# Patient Record
Sex: Male | Born: 1940
Health system: Southern US, Community
[De-identification: ages and names within clinical notes are randomized; demographics above are authoritative.]

## PROBLEM LIST (undated history)

## (undated) DIAGNOSIS — M545 Low back pain: Secondary | ICD-10-CM

## (undated) DIAGNOSIS — I6522 Occlusion and stenosis of left carotid artery: Secondary | ICD-10-CM

## (undated) DIAGNOSIS — C4490 Unspecified malignant neoplasm of skin, unspecified: Secondary | ICD-10-CM

## (undated) DIAGNOSIS — I10 Essential (primary) hypertension: Secondary | ICD-10-CM

## (undated) DIAGNOSIS — I739 Peripheral vascular disease, unspecified: Secondary | ICD-10-CM

## (undated) DIAGNOSIS — N509 Disorder of male genital organs, unspecified: Secondary | ICD-10-CM

## (undated) DIAGNOSIS — L0212 Furuncle of neck: Secondary | ICD-10-CM

## (undated) DIAGNOSIS — R109 Unspecified abdominal pain: Secondary | ICD-10-CM

## (undated) DIAGNOSIS — K08109 Complete loss of teeth, unspecified cause, unspecified class: Secondary | ICD-10-CM

## (undated) DIAGNOSIS — K420 Umbilical hernia with obstruction, without gangrene: Secondary | ICD-10-CM

## (undated) DIAGNOSIS — Z87442 Personal history of urinary calculi: Secondary | ICD-10-CM

## (undated) DIAGNOSIS — M48061 Spinal stenosis, lumbar region without neurogenic claudication: Secondary | ICD-10-CM

## (undated) DIAGNOSIS — M1990 Unspecified osteoarthritis, unspecified site: Secondary | ICD-10-CM

## (undated) DIAGNOSIS — Z95820 Peripheral vascular angioplasty status with implants and grafts: Secondary | ICD-10-CM

## (undated) DIAGNOSIS — E538 Deficiency of other specified B group vitamins: Secondary | ICD-10-CM

## (undated) DIAGNOSIS — K219 Gastro-esophageal reflux disease without esophagitis: Secondary | ICD-10-CM

## (undated) DIAGNOSIS — M549 Dorsalgia, unspecified: Secondary | ICD-10-CM

## (undated) DIAGNOSIS — K2270 Barrett's esophagus without dysplasia: Secondary | ICD-10-CM

## (undated) DIAGNOSIS — G4700 Insomnia, unspecified: Secondary | ICD-10-CM

## (undated) DIAGNOSIS — N61 Mastitis without abscess: Secondary | ICD-10-CM

## (undated) DIAGNOSIS — L0213 Carbuncle of neck: Secondary | ICD-10-CM

## (undated) DIAGNOSIS — M542 Cervicalgia: Secondary | ICD-10-CM

## (undated) DIAGNOSIS — K602 Anal fissure, unspecified: Secondary | ICD-10-CM

## (undated) DIAGNOSIS — R51 Headache: Secondary | ICD-10-CM

## (undated) DIAGNOSIS — M79609 Pain in unspecified limb: Secondary | ICD-10-CM

## (undated) DIAGNOSIS — K5130 Ulcerative (chronic) rectosigmoiditis without complications: Secondary | ICD-10-CM

## (undated) DIAGNOSIS — E739 Lactose intolerance, unspecified: Secondary | ICD-10-CM

## (undated) DIAGNOSIS — R7302 Impaired glucose tolerance (oral): Secondary | ICD-10-CM

## (undated) DIAGNOSIS — E559 Vitamin D deficiency, unspecified: Secondary | ICD-10-CM

## (undated) DIAGNOSIS — K5730 Diverticulosis of large intestine without perforation or abscess without bleeding: Secondary | ICD-10-CM

## (undated) DIAGNOSIS — Z972 Presence of dental prosthetic device (complete) (partial): Secondary | ICD-10-CM

## (undated) DIAGNOSIS — N62 Hypertrophy of breast: Secondary | ICD-10-CM

## (undated) DIAGNOSIS — E785 Hyperlipidemia, unspecified: Secondary | ICD-10-CM

## (undated) HISTORY — DX: Essential (primary) hypertension: I10

## (undated) HISTORY — DX: Personal history of urinary calculi: Z87.442

## (undated) HISTORY — DX: Dorsalgia, unspecified: M54.9

## (undated) HISTORY — DX: Mastitis without abscess: N61.0

## (undated) HISTORY — DX: Lactose intolerance, unspecified: E73.9

## (undated) HISTORY — PX: OTHER SURGICAL HISTORY: SHX169

## (undated) HISTORY — DX: Low back pain: M54.5

## (undated) HISTORY — DX: Pain in unspecified limb: M79.609

## (undated) HISTORY — PX: HERNIA REPAIR: SHX51

## (undated) HISTORY — DX: Headache: R51

## (undated) HISTORY — DX: Furuncle of neck: L02.12

## (undated) HISTORY — DX: Impaired glucose tolerance (oral): R73.02

## (undated) HISTORY — DX: Umbilical hernia with obstruction, without gangrene: K42.0

## (undated) HISTORY — PX: CATARACT EXTRACTION: SUR2

## (undated) HISTORY — DX: Ulcerative (chronic) rectosigmoiditis without complications: K51.30

## (undated) HISTORY — PX: COLONOSCOPY: SHX174

## (undated) HISTORY — DX: Barrett's esophagus without dysplasia: K22.70

## (undated) HISTORY — PX: UPPER GI ENDOSCOPY: SHX6162

## (undated) HISTORY — DX: Hypertrophy of breast: N62

## (undated) HISTORY — DX: Cervicalgia: M54.2

## (undated) HISTORY — DX: Anal fissure, unspecified: K60.2

## (undated) HISTORY — DX: Disorder of male genital organs, unspecified: N50.9

## (undated) HISTORY — DX: Vitamin D deficiency, unspecified: E55.9

## (undated) HISTORY — DX: Unspecified malignant neoplasm of skin, unspecified: C44.90

## (undated) HISTORY — DX: Diverticulosis of large intestine without perforation or abscess without bleeding: K57.30

## (undated) HISTORY — DX: Carbuncle of neck: L02.13

## (undated) HISTORY — DX: Unspecified osteoarthritis, unspecified site: M19.90

## (undated) HISTORY — DX: Deficiency of other specified B group vitamins: E53.8

## (undated) HISTORY — DX: Hyperlipidemia, unspecified: E78.5

## (undated) HISTORY — DX: Insomnia, unspecified: G47.00

## (undated) HISTORY — DX: Unspecified abdominal pain: R10.9

## (undated) HISTORY — DX: Gastro-esophageal reflux disease without esophagitis: K21.9

---

## 1995-09-09 DIAGNOSIS — K5130 Ulcerative (chronic) rectosigmoiditis without complications: Secondary | ICD-10-CM

## 1995-09-09 DIAGNOSIS — K2270 Barrett's esophagus without dysplasia: Secondary | ICD-10-CM

## 1995-09-09 HISTORY — DX: Ulcerative (chronic) rectosigmoiditis without complications: K51.30

## 1995-09-09 HISTORY — DX: Barrett's esophagus without dysplasia: K22.70

## 1995-09-11 ENCOUNTER — Encounter: Payer: Self-pay | Admitting: Gastroenterology

## 1999-03-11 ENCOUNTER — Emergency Department (HOSPITAL_COMMUNITY): Admission: EM | Admit: 1999-03-11 | Discharge: 1999-03-11 | Payer: Self-pay | Admitting: Emergency Medicine

## 2000-09-23 ENCOUNTER — Encounter: Payer: Self-pay | Admitting: Gastroenterology

## 2000-09-23 ENCOUNTER — Encounter (INDEPENDENT_AMBULATORY_CARE_PROVIDER_SITE_OTHER): Payer: Self-pay | Admitting: *Deleted

## 2000-09-23 ENCOUNTER — Other Ambulatory Visit: Admission: RE | Admit: 2000-09-23 | Discharge: 2000-09-23 | Payer: Self-pay | Admitting: Gastroenterology

## 2001-01-21 ENCOUNTER — Ambulatory Visit (HOSPITAL_COMMUNITY): Admission: RE | Admit: 2001-01-21 | Discharge: 2001-01-21 | Payer: Self-pay | Admitting: Gastroenterology

## 2001-01-21 ENCOUNTER — Encounter: Payer: Self-pay | Admitting: Gastroenterology

## 2001-01-28 ENCOUNTER — Encounter: Payer: Self-pay | Admitting: Urology

## 2001-01-28 ENCOUNTER — Ambulatory Visit (HOSPITAL_COMMUNITY): Admission: RE | Admit: 2001-01-28 | Discharge: 2001-01-28 | Payer: Self-pay | Admitting: Urology

## 2001-02-12 ENCOUNTER — Emergency Department (HOSPITAL_COMMUNITY): Admission: EM | Admit: 2001-02-12 | Discharge: 2001-02-12 | Payer: Self-pay | Admitting: Emergency Medicine

## 2001-02-24 ENCOUNTER — Emergency Department (HOSPITAL_COMMUNITY): Admission: EM | Admit: 2001-02-24 | Discharge: 2001-02-24 | Payer: Self-pay | Admitting: *Deleted

## 2002-01-21 ENCOUNTER — Encounter: Payer: Self-pay | Admitting: Gastroenterology

## 2002-03-28 ENCOUNTER — Encounter: Payer: Self-pay | Admitting: Gastroenterology

## 2002-04-25 ENCOUNTER — Encounter: Payer: Self-pay | Admitting: Gastroenterology

## 2002-08-20 ENCOUNTER — Encounter: Payer: Self-pay | Admitting: Emergency Medicine

## 2002-08-20 ENCOUNTER — Emergency Department (HOSPITAL_COMMUNITY): Admission: EM | Admit: 2002-08-20 | Discharge: 2002-08-20 | Payer: Self-pay | Admitting: Emergency Medicine

## 2002-11-16 ENCOUNTER — Encounter: Payer: Self-pay | Admitting: Gastroenterology

## 2004-07-25 ENCOUNTER — Ambulatory Visit: Payer: Self-pay | Admitting: Gastroenterology

## 2004-11-25 ENCOUNTER — Ambulatory Visit: Payer: Self-pay | Admitting: Gastroenterology

## 2004-12-06 ENCOUNTER — Ambulatory Visit: Payer: Self-pay | Admitting: Gastroenterology

## 2005-06-02 ENCOUNTER — Ambulatory Visit: Payer: Self-pay | Admitting: Gastroenterology

## 2005-07-08 ENCOUNTER — Ambulatory Visit: Payer: Self-pay | Admitting: Gastroenterology

## 2005-07-25 ENCOUNTER — Ambulatory Visit (HOSPITAL_BASED_OUTPATIENT_CLINIC_OR_DEPARTMENT_OTHER): Admission: RE | Admit: 2005-07-25 | Discharge: 2005-07-25 | Payer: Self-pay | Admitting: Urology

## 2005-07-25 ENCOUNTER — Ambulatory Visit (HOSPITAL_COMMUNITY): Admission: RE | Admit: 2005-07-25 | Discharge: 2005-07-25 | Payer: Self-pay | Admitting: Urology

## 2005-08-11 ENCOUNTER — Ambulatory Visit (HOSPITAL_COMMUNITY): Admission: RE | Admit: 2005-08-11 | Discharge: 2005-08-11 | Payer: Self-pay | Admitting: Urology

## 2005-08-29 ENCOUNTER — Ambulatory Visit: Payer: Self-pay | Admitting: Gastroenterology

## 2005-10-07 ENCOUNTER — Ambulatory Visit: Payer: Self-pay | Admitting: Gastroenterology

## 2005-11-10 ENCOUNTER — Ambulatory Visit: Payer: Self-pay | Admitting: Gastroenterology

## 2006-01-17 ENCOUNTER — Emergency Department (HOSPITAL_COMMUNITY): Admission: EM | Admit: 2006-01-17 | Discharge: 2006-01-17 | Payer: Self-pay | Admitting: Emergency Medicine

## 2006-01-29 ENCOUNTER — Ambulatory Visit (HOSPITAL_COMMUNITY): Admission: RE | Admit: 2006-01-29 | Discharge: 2006-01-29 | Payer: Self-pay | Admitting: Orthopedic Surgery

## 2006-06-09 ENCOUNTER — Observation Stay (HOSPITAL_COMMUNITY): Admission: EM | Admit: 2006-06-09 | Discharge: 2006-06-10 | Payer: Self-pay | Admitting: Emergency Medicine

## 2006-06-09 ENCOUNTER — Ambulatory Visit: Payer: Self-pay | Admitting: Cardiology

## 2006-06-10 ENCOUNTER — Ambulatory Visit: Payer: Self-pay

## 2006-06-23 ENCOUNTER — Ambulatory Visit: Payer: Self-pay | Admitting: Cardiology

## 2006-07-08 ENCOUNTER — Ambulatory Visit: Payer: Self-pay | Admitting: Gastroenterology

## 2006-07-10 ENCOUNTER — Ambulatory Visit: Payer: Self-pay | Admitting: Cardiology

## 2006-08-04 ENCOUNTER — Ambulatory Visit: Payer: Self-pay | Admitting: Internal Medicine

## 2006-09-14 ENCOUNTER — Ambulatory Visit: Payer: Self-pay | Admitting: Internal Medicine

## 2006-10-19 ENCOUNTER — Ambulatory Visit: Payer: Self-pay | Admitting: Internal Medicine

## 2006-10-19 LAB — CONVERTED CEMR LAB
ALT: 22 units/L (ref 0–40)
AST: 23 units/L (ref 0–37)
Albumin: 3.8 g/dL (ref 3.5–5.2)
Alkaline Phosphatase: 47 units/L (ref 39–117)
Bilirubin, Direct: 0.1 mg/dL (ref 0.0–0.3)
Total Bilirubin: 0.9 mg/dL (ref 0.3–1.2)
Total CK: 54 units/L (ref 7–195)

## 2006-11-25 ENCOUNTER — Ambulatory Visit: Payer: Self-pay | Admitting: Internal Medicine

## 2006-11-25 ENCOUNTER — Ambulatory Visit: Payer: Self-pay | Admitting: Gastroenterology

## 2006-11-25 LAB — CONVERTED CEMR LAB
ALT: 19 units/L (ref 0–40)
AST: 20 units/L (ref 0–37)
Albumin: 3.9 g/dL (ref 3.5–5.2)
Alkaline Phosphatase: 47 units/L (ref 39–117)
Bilirubin, Direct: 0.1 mg/dL (ref 0.0–0.3)
Cholesterol: 127 mg/dL (ref 0–200)
HDL: 32 mg/dL — ABNORMAL LOW (ref >39.0)
LDL Cholesterol: 73 mg/dL (ref 0–99)
Total Bilirubin: 0.8 mg/dL (ref 0.3–1.2)
Total CHOL/HDL Ratio: 4
Total Protein: 6.9 g/dL (ref 6.0–8.3)
Triglycerides: 109 mg/dL (ref 0–149)
VLDL: 22 mg/dL (ref 0–40)

## 2007-01-11 ENCOUNTER — Ambulatory Visit: Payer: Self-pay | Admitting: Internal Medicine

## 2007-01-14 ENCOUNTER — Ambulatory Visit: Payer: Self-pay | Admitting: Internal Medicine

## 2007-01-14 LAB — CONVERTED CEMR LAB
Albumin: 3.7 g/dL (ref 3.5–5.2)
Alkaline Phosphatase: 52 units/L (ref 39–117)
BUN: 20 mg/dL (ref 6–23)
Basophils Relative: 0.4 % (ref 0.0–1.0)
Crystals: NEGATIVE
Eosinophils Absolute: 0.2 10*3/uL (ref 0.0–0.6)
GFR calc Af Amer: 86 mL/min
Ketones, ur: NEGATIVE mg/dL
Leukocytes, UA: NEGATIVE
Lipase: 19 units/L (ref 11.0–59.0)
Lymphocytes Relative: 6.5 % — ABNORMAL LOW (ref 12.0–46.0)
Monocytes Relative: 9.5 % (ref 3.0–11.0)
Neutro Abs: 8.3 10*3/uL — ABNORMAL HIGH (ref 1.4–7.7)
Platelets: 208 10*3/uL (ref 150–400)
Potassium: 3.9 meq/L (ref 3.5–5.1)
RBC: 4.86 M/uL (ref 4.22–5.81)
Specific Gravity, Urine: 1.02 (ref 1.000–1.03)
Urine Glucose: 100 mg/dL — AB
Urobilinogen, UA: 2 (ref 0.0–1.0)

## 2007-01-15 ENCOUNTER — Ambulatory Visit (HOSPITAL_COMMUNITY): Admission: RE | Admit: 2007-01-15 | Discharge: 2007-01-15 | Payer: Self-pay | Admitting: Internal Medicine

## 2007-02-11 ENCOUNTER — Ambulatory Visit: Payer: Self-pay | Admitting: Internal Medicine

## 2007-06-28 ENCOUNTER — Encounter: Payer: Self-pay | Admitting: Internal Medicine

## 2007-06-28 ENCOUNTER — Ambulatory Visit: Payer: Self-pay | Admitting: Internal Medicine

## 2007-06-28 DIAGNOSIS — E785 Hyperlipidemia, unspecified: Secondary | ICD-10-CM

## 2007-06-28 DIAGNOSIS — K420 Umbilical hernia with obstruction, without gangrene: Secondary | ICD-10-CM

## 2007-06-28 DIAGNOSIS — M545 Low back pain, unspecified: Secondary | ICD-10-CM | POA: Insufficient documentation

## 2007-06-28 DIAGNOSIS — Z87442 Personal history of urinary calculi: Secondary | ICD-10-CM | POA: Insufficient documentation

## 2007-06-28 DIAGNOSIS — K5730 Diverticulosis of large intestine without perforation or abscess without bleeding: Secondary | ICD-10-CM

## 2007-06-28 DIAGNOSIS — I10 Essential (primary) hypertension: Secondary | ICD-10-CM

## 2007-06-28 DIAGNOSIS — K219 Gastro-esophageal reflux disease without esophagitis: Secondary | ICD-10-CM | POA: Insufficient documentation

## 2007-06-28 HISTORY — DX: Gastro-esophageal reflux disease without esophagitis: K21.9

## 2007-06-28 HISTORY — DX: Essential (primary) hypertension: I10

## 2007-06-28 HISTORY — DX: Umbilical hernia with obstruction, without gangrene: K42.0

## 2007-06-28 HISTORY — DX: Diverticulosis of large intestine without perforation or abscess without bleeding: K57.30

## 2007-06-28 HISTORY — DX: Hyperlipidemia, unspecified: E78.5

## 2007-06-28 HISTORY — DX: Personal history of urinary calculi: Z87.442

## 2007-06-28 HISTORY — DX: Low back pain, unspecified: M54.50

## 2007-08-03 ENCOUNTER — Encounter: Payer: Self-pay | Admitting: Internal Medicine

## 2007-08-16 ENCOUNTER — Ambulatory Visit: Payer: Self-pay | Admitting: Internal Medicine

## 2007-08-16 DIAGNOSIS — K2270 Barrett's esophagus without dysplasia: Secondary | ICD-10-CM

## 2007-08-16 LAB — CONVERTED CEMR LAB
Bilirubin Urine: NEGATIVE
Bilirubin, Direct: 0.2 mg/dL (ref 0.0–0.3)
Calcium: 9 mg/dL (ref 8.4–10.5)
Cholesterol: 186 mg/dL (ref 0–200)
Eosinophils Absolute: 0.3 10*3/uL (ref 0.0–0.6)
Eosinophils Relative: 4.9 % (ref 0.0–5.0)
GFR calc Af Amer: 86 mL/min
GFR calc non Af Amer: 71 mL/min
Glucose, Bld: 121 mg/dL — ABNORMAL HIGH (ref 70–99)
Hemoglobin: 16.3 g/dL (ref 13.0–17.0)
Lymphocytes Relative: 12 % (ref 12.0–46.0)
MCV: 91.5 fL (ref 78.0–100.0)
Monocytes Absolute: 0.6 10*3/uL (ref 0.2–0.7)
Neutro Abs: 4.4 10*3/uL (ref 1.4–7.7)
Neutrophils Relative %: 72.4 % (ref 43.0–77.0)
Nitrite: NEGATIVE
PSA: 0.67 ng/mL (ref 0.10–4.00)
Platelets: 195 10*3/uL (ref 150–400)
Potassium: 4.2 meq/L (ref 3.5–5.1)
Sodium: 135 meq/L (ref 135–145)
TSH: 2.78 microintl units/mL (ref 0.35–5.50)
Total CHOL/HDL Ratio: 6.8
Total Protein: 6.7 g/dL (ref 6.0–8.3)
Triglycerides: 104 mg/dL (ref 0–149)
Urine Glucose: NEGATIVE mg/dL
WBC: 6 10*3/uL (ref 4.5–10.5)

## 2007-08-30 ENCOUNTER — Ambulatory Visit: Payer: Self-pay | Admitting: Gastroenterology

## 2007-09-09 HISTORY — PX: OTHER SURGICAL HISTORY: SHX169

## 2007-09-14 ENCOUNTER — Encounter: Payer: Self-pay | Admitting: Gastroenterology

## 2007-09-14 ENCOUNTER — Encounter: Payer: Self-pay | Admitting: Internal Medicine

## 2007-09-14 ENCOUNTER — Ambulatory Visit: Payer: Self-pay | Admitting: Gastroenterology

## 2007-09-30 ENCOUNTER — Ambulatory Visit (HOSPITAL_COMMUNITY): Admission: RE | Admit: 2007-09-30 | Discharge: 2007-09-30 | Payer: Self-pay | Admitting: Surgery

## 2007-10-14 ENCOUNTER — Encounter: Payer: Self-pay | Admitting: Internal Medicine

## 2007-12-20 ENCOUNTER — Ambulatory Visit: Payer: Self-pay | Admitting: Gastroenterology

## 2008-01-18 ENCOUNTER — Telehealth: Payer: Self-pay | Admitting: Gastroenterology

## 2008-02-07 ENCOUNTER — Ambulatory Visit: Payer: Self-pay | Admitting: Internal Medicine

## 2008-02-07 LAB — CONVERTED CEMR LAB
Cholesterol: 207 mg/dL (ref 0–200)
Triglycerides: 153 mg/dL — ABNORMAL HIGH (ref 0–149)

## 2008-02-14 ENCOUNTER — Encounter (INDEPENDENT_AMBULATORY_CARE_PROVIDER_SITE_OTHER): Payer: Self-pay | Admitting: *Deleted

## 2008-02-14 ENCOUNTER — Ambulatory Visit: Payer: Self-pay | Admitting: Internal Medicine

## 2008-02-14 DIAGNOSIS — L0213 Carbuncle of neck: Secondary | ICD-10-CM

## 2008-02-14 DIAGNOSIS — L0212 Furuncle of neck: Secondary | ICD-10-CM

## 2008-02-14 HISTORY — DX: Furuncle of neck: L02.12

## 2008-02-14 HISTORY — DX: Carbuncle of neck: L02.13

## 2008-03-13 ENCOUNTER — Ambulatory Visit: Payer: Self-pay | Admitting: Internal Medicine

## 2008-03-13 LAB — CONVERTED CEMR LAB
Albumin: 3.9 g/dL (ref 3.5–5.2)
HDL: 27.7 mg/dL — ABNORMAL LOW (ref >39.0)
Total Bilirubin: 0.7 mg/dL (ref 0.3–1.2)
Total CHOL/HDL Ratio: 5.8
Triglycerides: 129 mg/dL (ref 0–149)
VLDL: 26 mg/dL (ref 0–40)

## 2008-04-18 ENCOUNTER — Telehealth: Payer: Self-pay | Admitting: Gastroenterology

## 2008-04-20 DIAGNOSIS — Z860100 Personal history of colon polyps, unspecified: Secondary | ICD-10-CM | POA: Insufficient documentation

## 2008-04-24 ENCOUNTER — Ambulatory Visit: Payer: Self-pay | Admitting: Gastroenterology

## 2008-04-24 DIAGNOSIS — K602 Anal fissure, unspecified: Secondary | ICD-10-CM

## 2008-04-24 DIAGNOSIS — Z8719 Personal history of other diseases of the digestive system: Secondary | ICD-10-CM

## 2008-04-24 HISTORY — DX: Anal fissure, unspecified: K60.2

## 2008-05-04 ENCOUNTER — Telehealth (INDEPENDENT_AMBULATORY_CARE_PROVIDER_SITE_OTHER): Payer: Self-pay | Admitting: *Deleted

## 2008-05-31 ENCOUNTER — Ambulatory Visit: Payer: Self-pay | Admitting: Internal Medicine

## 2008-05-31 DIAGNOSIS — M542 Cervicalgia: Secondary | ICD-10-CM

## 2008-05-31 HISTORY — DX: Cervicalgia: M54.2

## 2008-06-12 ENCOUNTER — Telehealth: Payer: Self-pay | Admitting: Gastroenterology

## 2008-06-13 ENCOUNTER — Ambulatory Visit: Payer: Self-pay | Admitting: Internal Medicine

## 2008-06-14 ENCOUNTER — Telehealth (INDEPENDENT_AMBULATORY_CARE_PROVIDER_SITE_OTHER): Payer: Self-pay | Admitting: *Deleted

## 2008-06-14 ENCOUNTER — Encounter: Payer: Self-pay | Admitting: Internal Medicine

## 2008-06-30 ENCOUNTER — Encounter: Payer: Self-pay | Admitting: Internal Medicine

## 2008-07-14 ENCOUNTER — Ambulatory Visit (HOSPITAL_COMMUNITY): Admission: RE | Admit: 2008-07-14 | Discharge: 2008-07-14 | Payer: Self-pay | Admitting: *Deleted

## 2008-07-14 ENCOUNTER — Encounter (INDEPENDENT_AMBULATORY_CARE_PROVIDER_SITE_OTHER): Payer: Self-pay | Admitting: *Deleted

## 2008-07-27 ENCOUNTER — Encounter: Payer: Self-pay | Admitting: Internal Medicine

## 2008-08-08 ENCOUNTER — Ambulatory Visit: Payer: Self-pay | Admitting: Internal Medicine

## 2008-08-08 LAB — CONVERTED CEMR LAB
ALT: 22 units/L (ref 0–53)
Basophils Absolute: 0 10*3/uL (ref 0.0–0.1)
Basophils Relative: 0.1 % (ref 0.0–3.0)
Bilirubin Urine: NEGATIVE
Bilirubin, Direct: 0.1 mg/dL (ref 0.0–0.3)
CO2: 31 meq/L (ref 19–32)
Calcium: 9.5 mg/dL (ref 8.4–10.5)
Cholesterol: 219 mg/dL (ref 0–200)
Creatinine, Ser: 1 mg/dL (ref 0.4–1.5)
Direct LDL: 142.3 mg/dL
Eosinophils Absolute: 0.2 10*3/uL (ref 0.0–0.7)
GFR calc Af Amer: 96 mL/min
GFR calc non Af Amer: 79 mL/min
Hemoglobin: 16.3 g/dL (ref 13.0–17.0)
Ketones, ur: NEGATIVE mg/dL
Leukocytes, UA: NEGATIVE
Lymphocytes Relative: 12.1 % (ref 12.0–46.0)
MCHC: 34.8 g/dL (ref 30.0–36.0)
MCV: 89.5 fL (ref 78.0–100.0)
Monocytes Absolute: 0.6 10*3/uL (ref 0.1–1.0)
Neutro Abs: 4.7 10*3/uL (ref 1.4–7.7)
PSA: 1.33 ng/mL (ref 0.10–4.00)
RBC: 5.25 M/uL (ref 4.22–5.81)
RDW: 12.9 % (ref 11.5–14.6)
Sodium: 137 meq/L (ref 135–145)
TSH: 1.94 microintl units/mL (ref 0.35–5.50)
Total Bilirubin: 0.7 mg/dL (ref 0.3–1.2)
Total CHOL/HDL Ratio: 8
Triglycerides: 135 mg/dL (ref 0–149)
Urine Glucose: NEGATIVE mg/dL
Urobilinogen, UA: 0.2 (ref 0.0–1.0)
VLDL: 27 mg/dL (ref 0–40)
pH: 6 (ref 5.0–8.0)

## 2008-08-15 ENCOUNTER — Ambulatory Visit: Payer: Self-pay | Admitting: Internal Medicine

## 2008-08-15 DIAGNOSIS — E739 Lactose intolerance, unspecified: Secondary | ICD-10-CM

## 2008-08-15 DIAGNOSIS — R1084 Generalized abdominal pain: Secondary | ICD-10-CM

## 2008-08-15 HISTORY — DX: Lactose intolerance, unspecified: E73.9

## 2008-08-16 ENCOUNTER — Telehealth (INDEPENDENT_AMBULATORY_CARE_PROVIDER_SITE_OTHER): Payer: Self-pay | Admitting: *Deleted

## 2008-08-18 ENCOUNTER — Telehealth (INDEPENDENT_AMBULATORY_CARE_PROVIDER_SITE_OTHER): Payer: Self-pay | Admitting: *Deleted

## 2008-08-23 ENCOUNTER — Ambulatory Visit: Payer: Self-pay | Admitting: Cardiology

## 2008-08-25 ENCOUNTER — Encounter: Payer: Self-pay | Admitting: Internal Medicine

## 2008-10-10 ENCOUNTER — Ambulatory Visit: Payer: Self-pay | Admitting: Internal Medicine

## 2008-10-10 DIAGNOSIS — R079 Chest pain, unspecified: Secondary | ICD-10-CM

## 2008-10-10 DIAGNOSIS — G4700 Insomnia, unspecified: Secondary | ICD-10-CM

## 2008-10-10 HISTORY — DX: Insomnia, unspecified: G47.00

## 2008-10-23 ENCOUNTER — Ambulatory Visit: Payer: Self-pay

## 2008-10-23 ENCOUNTER — Encounter: Payer: Self-pay | Admitting: Internal Medicine

## 2009-01-10 ENCOUNTER — Ambulatory Visit: Payer: Self-pay | Admitting: Internal Medicine

## 2009-01-10 DIAGNOSIS — M549 Dorsalgia, unspecified: Secondary | ICD-10-CM | POA: Insufficient documentation

## 2009-01-10 DIAGNOSIS — N61 Mastitis without abscess: Secondary | ICD-10-CM

## 2009-01-10 HISTORY — DX: Mastitis without abscess: N61.0

## 2009-01-10 HISTORY — DX: Dorsalgia, unspecified: M54.9

## 2009-01-10 LAB — CONVERTED CEMR LAB
ALT: 22 units/L (ref 0–53)
AST: 19 units/L (ref 0–37)
Albumin: 4.1 g/dL (ref 3.5–5.2)
Alkaline Phosphatase: 59 units/L (ref 39–117)
Cholesterol: 137 mg/dL (ref 0–200)
HDL: 27.2 mg/dL — ABNORMAL LOW (ref >39.00)
Total CHOL/HDL Ratio: 5
Triglycerides: 128 mg/dL (ref 0.0–149.0)

## 2009-02-16 ENCOUNTER — Ambulatory Visit: Payer: Self-pay | Admitting: Internal Medicine

## 2009-02-16 DIAGNOSIS — M79609 Pain in unspecified limb: Secondary | ICD-10-CM

## 2009-02-16 DIAGNOSIS — N62 Hypertrophy of breast: Secondary | ICD-10-CM

## 2009-02-16 HISTORY — DX: Pain in unspecified limb: M79.609

## 2009-02-16 HISTORY — DX: Hypertrophy of breast: N62

## 2009-02-20 ENCOUNTER — Encounter: Payer: Self-pay | Admitting: Internal Medicine

## 2009-02-22 ENCOUNTER — Ambulatory Visit: Payer: Self-pay

## 2009-02-22 ENCOUNTER — Encounter: Payer: Self-pay | Admitting: Internal Medicine

## 2009-03-15 ENCOUNTER — Telehealth: Payer: Self-pay | Admitting: Gastroenterology

## 2009-03-15 ENCOUNTER — Telehealth: Payer: Self-pay | Admitting: Internal Medicine

## 2009-03-16 ENCOUNTER — Ambulatory Visit: Payer: Self-pay | Admitting: Gastroenterology

## 2009-03-16 ENCOUNTER — Telehealth: Payer: Self-pay | Admitting: Gastroenterology

## 2009-04-19 ENCOUNTER — Ambulatory Visit: Payer: Self-pay | Admitting: Gastroenterology

## 2009-04-27 ENCOUNTER — Telehealth: Payer: Self-pay | Admitting: Internal Medicine

## 2009-05-01 ENCOUNTER — Encounter: Payer: Self-pay | Admitting: Gastroenterology

## 2009-05-01 ENCOUNTER — Encounter: Payer: Self-pay | Admitting: Internal Medicine

## 2009-06-21 ENCOUNTER — Ambulatory Visit: Payer: Self-pay | Admitting: Internal Medicine

## 2009-07-11 ENCOUNTER — Telehealth: Payer: Self-pay | Admitting: Gastroenterology

## 2009-09-03 ENCOUNTER — Ambulatory Visit: Payer: Self-pay | Admitting: Internal Medicine

## 2009-09-03 DIAGNOSIS — J019 Acute sinusitis, unspecified: Secondary | ICD-10-CM | POA: Insufficient documentation

## 2009-09-13 ENCOUNTER — Telehealth: Payer: Self-pay | Admitting: Internal Medicine

## 2009-09-19 ENCOUNTER — Telehealth: Payer: Self-pay | Admitting: Internal Medicine

## 2009-10-16 ENCOUNTER — Ambulatory Visit: Payer: Self-pay | Admitting: Internal Medicine

## 2009-10-16 ENCOUNTER — Telehealth: Payer: Self-pay | Admitting: Internal Medicine

## 2009-10-16 DIAGNOSIS — F329 Major depressive disorder, single episode, unspecified: Secondary | ICD-10-CM | POA: Insufficient documentation

## 2009-10-16 DIAGNOSIS — M25519 Pain in unspecified shoulder: Secondary | ICD-10-CM

## 2009-10-16 DIAGNOSIS — R109 Unspecified abdominal pain: Secondary | ICD-10-CM

## 2009-10-16 DIAGNOSIS — M25559 Pain in unspecified hip: Secondary | ICD-10-CM | POA: Insufficient documentation

## 2009-10-16 HISTORY — DX: Unspecified abdominal pain: R10.9

## 2009-10-16 LAB — CONVERTED CEMR LAB
AST: 19 units/L (ref 0–37)
Alkaline Phosphatase: 54 units/L (ref 39–117)
Basophils Absolute: 0.1 10*3/uL (ref 0.0–0.1)
Basophils Relative: 0.7 % (ref 0.0–3.0)
Bilirubin Urine: NEGATIVE
Bilirubin, Direct: 0.2 mg/dL (ref 0.0–0.3)
CO2: 31 meq/L (ref 19–32)
Calcium: 9.3 mg/dL (ref 8.4–10.5)
Creatinine, Ser: 1.1 mg/dL (ref 0.4–1.5)
Eosinophils Absolute: 0.2 10*3/uL (ref 0.0–0.7)
GFR calc non Af Amer: 70.68 mL/min (ref >60)
HDL: 32.7 mg/dL — ABNORMAL LOW (ref >39.00)
Hemoglobin, Urine: NEGATIVE
Ketones, ur: NEGATIVE mg/dL
Lymphocytes Relative: 10.8 % — ABNORMAL LOW (ref 12.0–46.0)
MCHC: 33.2 g/dL (ref 30.0–36.0)
Monocytes Relative: 9.4 % (ref 3.0–12.0)
Neutrophils Relative %: 75.9 % (ref 43.0–77.0)
PSA: 0.65 ng/mL (ref 0.10–4.00)
RBC: 5.16 M/uL (ref 4.22–5.81)
RDW: 13.4 % (ref 11.5–14.6)
Sodium: 140 meq/L (ref 135–145)
Total CHOL/HDL Ratio: 4
Urine Glucose: NEGATIVE mg/dL
Urobilinogen, UA: 0.2 (ref 0.0–1.0)
VLDL: 19.4 mg/dL (ref 0.0–40.0)

## 2009-10-18 ENCOUNTER — Encounter (INDEPENDENT_AMBULATORY_CARE_PROVIDER_SITE_OTHER): Payer: Self-pay | Admitting: *Deleted

## 2009-10-18 ENCOUNTER — Telehealth: Payer: Self-pay | Admitting: Internal Medicine

## 2009-10-26 ENCOUNTER — Telehealth (INDEPENDENT_AMBULATORY_CARE_PROVIDER_SITE_OTHER): Payer: Self-pay | Admitting: *Deleted

## 2009-10-26 ENCOUNTER — Encounter: Payer: Self-pay | Admitting: Internal Medicine

## 2009-10-29 ENCOUNTER — Telehealth: Payer: Self-pay | Admitting: Internal Medicine

## 2009-10-30 ENCOUNTER — Telehealth: Payer: Self-pay | Admitting: Gastroenterology

## 2009-11-01 ENCOUNTER — Telehealth (INDEPENDENT_AMBULATORY_CARE_PROVIDER_SITE_OTHER): Payer: Self-pay | Admitting: *Deleted

## 2009-11-06 ENCOUNTER — Encounter: Payer: Self-pay | Admitting: Gastroenterology

## 2009-11-08 ENCOUNTER — Ambulatory Visit (HOSPITAL_COMMUNITY): Admission: RE | Admit: 2009-11-08 | Discharge: 2009-11-08 | Payer: Self-pay | Admitting: Orthopedic Surgery

## 2009-11-12 ENCOUNTER — Encounter: Payer: Self-pay | Admitting: Internal Medicine

## 2009-11-13 ENCOUNTER — Ambulatory Visit: Payer: Self-pay | Admitting: Internal Medicine

## 2009-11-13 DIAGNOSIS — R51 Headache: Secondary | ICD-10-CM

## 2009-11-13 DIAGNOSIS — E559 Vitamin D deficiency, unspecified: Secondary | ICD-10-CM | POA: Insufficient documentation

## 2009-11-13 DIAGNOSIS — R519 Headache, unspecified: Secondary | ICD-10-CM | POA: Insufficient documentation

## 2009-11-13 HISTORY — DX: Headache: R51

## 2009-11-13 HISTORY — DX: Vitamin D deficiency, unspecified: E55.9

## 2009-11-14 ENCOUNTER — Ambulatory Visit: Payer: Self-pay | Admitting: Gastroenterology

## 2009-11-14 ENCOUNTER — Telehealth: Payer: Self-pay | Admitting: Gastroenterology

## 2009-11-14 DIAGNOSIS — K5100 Ulcerative (chronic) pancolitis without complications: Secondary | ICD-10-CM

## 2009-12-07 ENCOUNTER — Encounter: Payer: Self-pay | Admitting: Internal Medicine

## 2009-12-07 ENCOUNTER — Encounter: Admission: RE | Admit: 2009-12-07 | Discharge: 2009-12-07 | Payer: Self-pay | Admitting: Surgery

## 2009-12-18 ENCOUNTER — Ambulatory Visit: Payer: Self-pay | Admitting: Gastroenterology

## 2009-12-20 ENCOUNTER — Encounter: Payer: Self-pay | Admitting: Gastroenterology

## 2009-12-20 ENCOUNTER — Encounter: Payer: Self-pay | Admitting: Internal Medicine

## 2010-01-11 ENCOUNTER — Encounter: Payer: Self-pay | Admitting: Internal Medicine

## 2010-04-12 ENCOUNTER — Ambulatory Visit: Payer: Self-pay | Admitting: Internal Medicine

## 2010-04-12 LAB — CONVERTED CEMR LAB
ALT: 23 units/L (ref 0–53)
Alkaline Phosphatase: 49 units/L (ref 39–117)
Bilirubin, Direct: 0.1 mg/dL (ref 0.0–0.3)
Calcium: 9.4 mg/dL (ref 8.4–10.5)
Direct LDL: 188.3 mg/dL
Eosinophils Relative: 3.8 % (ref 0.0–5.0)
GFR calc non Af Amer: 78.79 mL/min (ref >60)
Glucose, Bld: 126 mg/dL — ABNORMAL HIGH (ref 70–99)
HCT: 46.8 % (ref 39.0–52.0)
HDL: 30.1 mg/dL — ABNORMAL LOW (ref >39.00)
Lymphs Abs: 0.9 10*3/uL (ref 0.7–4.0)
MCHC: 34.9 g/dL (ref 30.0–36.0)
MCV: 91.8 fL (ref 78.0–100.0)
Monocytes Absolute: 0.7 10*3/uL (ref 0.1–1.0)
Platelets: 209 10*3/uL (ref 150.0–400.0)
RDW: 14.5 % (ref 11.5–14.6)
Sodium: 140 meq/L (ref 135–145)
Total Protein: 6.8 g/dL (ref 6.0–8.3)
WBC: 6.7 10*3/uL (ref 4.5–10.5)

## 2010-04-16 ENCOUNTER — Ambulatory Visit: Payer: Self-pay | Admitting: Internal Medicine

## 2010-05-08 ENCOUNTER — Telehealth: Payer: Self-pay | Admitting: Gastroenterology

## 2010-05-08 ENCOUNTER — Encounter: Payer: Self-pay | Admitting: Gastroenterology

## 2010-08-09 ENCOUNTER — Encounter: Payer: Self-pay | Admitting: Internal Medicine

## 2010-09-18 ENCOUNTER — Encounter: Payer: Self-pay | Admitting: Gastroenterology

## 2010-10-04 ENCOUNTER — Telehealth: Payer: Self-pay | Admitting: Internal Medicine

## 2010-10-10 NOTE — Progress Notes (Signed)
Summary: 30 day supply  Phone Note Call from Patient Call back at Home Phone [phone]   Caller: Patient Summary of Call: pt called requesting 30 days supply until appt for CPX 02.08.2011. Initial call taken by: Crissie Sickles, CMA,  September 19, 2009 11:29 AM    Prescriptions: SIMVASTATIN 80 MG TABS (SIMVASTATIN) 1po once daily  #30 x 0   Entered by:   Crissie Sickles, CMA   Authorized by:   Biagio Borg MD   Signed by:   Crissie Sickles, CMA on 09/19/2009   Method used:   Electronically to        [facility] [phone]* (retail)       [place], [place]  [id number]       Ph: [phone] or [phone]       Fax: [phone]   RxID:   [id number]

## 2010-10-10 NOTE — Letter (Signed)
Summary: [facility]   Imported By: Bubba Hales 01/09/2010 09:31:58  _____________________________________________________________________  External Attachment:    Type:   Image     Comment:   External Document

## 2010-10-10 NOTE — Letter (Signed)
Summary: Colonoscopy Letter  [facility]  [place], [place] [address]   Phone: [phone]  Fax: [phone]      October 18, 2009 MRN: [id number]   [place], [place]  [address]   Dear Mr. MARRO,   According to your medical record, it is time for you to schedule a Colonoscopy. The American Cancer Society recommends this procedure as a method to detect early colon cancer. Patients with a family history of colon cancer, or a personal history of colon polyps or inflammatory bowel disease are at increased risk.  This letter has beeen generated based on the recommendations made at the time of your procedure. If you feel that in your particular situation this may no longer apply, please contact our office.  Please call our office at [phone] to schedule this appointment or to update your records at your earliest convenience.  Thank you for cooperating with [place] to provide you with the very best care possible.   Sincerely,  Norberto Sorenson T. Fuller Plan, M.D.  [facility] [phone]

## 2010-10-10 NOTE — Progress Notes (Signed)
Summary: Records request  Records request received from [facility] via the fax machine. Forwarded to [employer] for processing. Sherri Sear  October 26, 2009 5:34 PM

## 2010-10-10 NOTE — Progress Notes (Signed)
Summary: medication refill  Phone Note Refill Request Message from:  Fax from Pharmacy on October 04, 2010 9:03 AM  Refills Requested: Medication #1:  ZOLPIDEM TARTRATE 10 MG TABS 1 by mouth at bedtime as needed   Dosage confirmed as above?Dosage Confirmed   Last Refilled: 04/16/2010   Notes: [facility] Initial call taken by: Shirlean Mylar Ewing CMA Deborra Medina),  October 04, 2010 9:04 AM  Follow-up for Phone Call        faxed hardcopy to pharmacy. Follow-up by: Shirlean Mylar Ewing CMA (AAMA),  October 04, 2010 1:12 PM    New/Updated Medications: ZOLPIDEM TARTRATE 10 MG TABS (ZOLPIDEM TARTRATE) 1 by mouth at bedtime as needed Prescriptions: ZOLPIDEM TARTRATE 10 MG TABS (ZOLPIDEM TARTRATE) 1 by mouth at bedtime as needed  #30 x 5   Entered and Authorized by:   Biagio Borg MD   Signed by:   Biagio Borg MD on 10/04/2010   Method used:   Print then Give to Patient   RxID:   [id number]  done hardcopy to [facility] side B - dahlia Biagio Borg MD  October 04, 2010 11:51 AM

## 2010-10-10 NOTE — Progress Notes (Signed)
Summary: RX REQUEST  Phone Note Call from Patient Call back at Home Phone [phone]   Caller: Patient Call For: Biagio Borg MD Summary of Call: Pt came into the office requesting a refill for LISINOP-HCTZ TAB 20-25MG. Pt changed his insurance to [employer] and ID # is [id number]. Pt used [facility] his [facility], and the fax # is [phone].  Initial call taken by: Shawnie Pons,  September 13, 2009 2:04 PM  Follow-up for Phone Call        ?ok to give enough for 1 year (90 day supply) Follow-up by: Ernestene Mention,  September 14, 2009 8:55 AM  Additional Follow-up for Phone Call Additional follow up Details #1::        to [facility] for routine refils Additional Follow-up by: Biagio Borg MD,  September 14, 2009 10:07 AM    Prescriptions: LISINOPRIL-HYDROCHLOROTHIAZIDE 20-25 MG TABS (LISINOPRIL-HYDROCHLOROTHIAZIDE) 1po once daily  #90 x 3   Entered by:   Sharon Seller   Authorized by:   Biagio Borg MD   Signed by:   Sharon Seller on 09/14/2009   Method used:   Faxed to ...       [facility] (mail-order)       [place]  [address]       Ph: [phone]       Fax: [phone]   RxID:   [id number]

## 2010-10-10 NOTE — Letter (Signed)
Summary: [facility]   Imported By: Bubba Hales 02/05/2010 10:15:58  _____________________________________________________________________  External Attachment:    Type:   Image     Comment:   External Document

## 2010-10-10 NOTE — Assessment & Plan Note (Signed)
Summary: F/U Ulcerative colitis   History of Present Illness Visit Type: Follow-up Visit Primary GI MD: Joylene Igo MD [facility] Primary Provider: [name], MD Requesting Provider: n/a Chief Complaint: Patient here to follow up on his UC and Barretts. He is here to discuss having his colonoscopy and would like to have his Egd on the same day. He states that the rectal bleeding he had when he made the appt has resolved itself and he is doing good.  History of Present Illness:   This is a return visit for Barrett's esophagus and left-sided ulcerative colitis. He relates the symptoms are under good control and he has no bleeding, diarrhea or reflux symptoms at this time. He has had some difficulty getting his prescription filled for Azulfidine and switched to Asacol for a while but now he is returned to the use of Azulfidine. He is due for surveillance colonoscopy.   GI Review of Systems      Denies abdominal pain, acid reflux, belching, bloating, chest pain, dysphagia with liquids, dysphagia with solids, heartburn, loss of appetite, nausea, vomiting, vomiting blood, weight loss, and  weight gain.        Denies anal fissure, black tarry stools, change in bowel habit, constipation, diarrhea, diverticulosis, fecal incontinence, heme positive stool, hemorrhoids, irritable bowel syndrome, jaundice, light color stool, liver problems, rectal bleeding, and  rectal pain.   Current Medications (verified): 1)  Lisinopril-Hydrochlorothiazide 20-25 Mg Tabs (Lisinopril-Hydrochlorothiazide) .[name].. 1po Once Daily 2)  Ecotrin Low Strength 81 Mg  Tbec (Aspirin) .[name].. 1 By Mouth Qd 3)  Azulfidine 500 Mg Tabs (Sulfasalazine) .... Take 1 Tablet By Mouth Two Times A Day 4)  Simvastatin 80 Mg Tabs (Simvastatin) .[name].. 1po Once Daily 5)  Omeprazole 40 Mg Cpdr (Omeprazole) .... Take 1 Tablet By Mouth Two Times A Day 6)  Tramadol Hcl 50 Mg Tabs (Tramadol Hcl) .[name].. 1 - 2 By Mouth Four Times Per Day As Needed Pain 7)   Citalopram Hydrobromide 10 Mg Tabs (Citalopram Hydrobromide) .[name].. 1po Once Daily 8)  Promethazine Hcl 25 Mg Tabs (Promethazine Hcl) .[name].. 1 By Mouth As Needed Nausea 9)  Zolpidem Tartrate 10 Mg Tabs (Zolpidem Tartrate) .[name].. 1 By Mouth At Bedtime As Needed  Allergies (verified): 1)  ! * Ivp Dye 2)  * Trazodone  Past History:  Past Medical History: Last updated: 11/13/2009 Diverticulosis, colon GERD Nephrolithiasis, hx of Hyperlipidemia Hypertension Ulcerative proctosigmoiditis 1997 Low back pain Barrett's esophagus 1997 Adenomatous Colon Polyps 01/2002 glucose intolerance/borderline DM H. pylori gastritis 1997 Depression vit d  deficiency  Past Surgical History: Last updated: 03/16/2009 Left knee arthroscopy Bilateral inguinal hernia with mesh and umblical hernia repairs 09/2007 Anal fissure surgery  Family History: Reviewed history from 03/16/2009 and no changes required. parents with COPD No FH of Colon Cancer  Social History: Former Smoker Alcohol use-yes  1 beer occ Married,  3 children retired - truck Psychiatric nurse Caffeine Use one cup coffee day  Review of Systems       The pertinent positives and negatives are noted as above and in the HPI. All other ROS were reviewed and were negative.   Vital Signs:  Patient profile:   70 year old male Height:      68 inches Weight:      239.4 pounds BMI:     36.53 Pulse rate:   76 / minute Pulse rhythm:   regular BP sitting:   116 / 62  (left arm) Cuff size:   regular  Vitals  Entered By: Bernita Buffy CMA Deborra Medina) (November 14, 2009 10:54 AM)  Physical Exam  General:  Well developed, well nourished, no acute distress. Head:  Normocephalic and atraumatic. Eyes:  PERRLA, no icterus. Mouth:  No deformity or lesions, dentition normal. Lungs:  Clear throughout to auscultation. Heart:  Regular rate and rhythm; no murmurs, rubs,  or bruits. Abdomen:  Soft, nontender and nondistended. No masses, hepatosplenomegaly or  hernias noted. Normal bowel sounds. Rectal:  deferred until time of colonoscopy.   Psych:  Alert and cooperative. Normal mood and affect.   Impression & Recommendations:  Problem # 1:  UNIVERSAL ULCERATIVE COLITIS (ICD-556.6) Assessment Unchanged Continue Azulfidine 1 g twice daily. Schedule surveillance colonoscopy. The risks, benefits and alternatives to colonoscopy with possible biopsy and possible polypectomy were discussed with the patient and they consent to proceed. The procedure will be scheduled electively. Orders: Colonoscopy (Colon)  Problem # 2:  BARRETTS ESOPHAGUS (ICD-530.85) Continue omeprazole 40 mg twice daily, along with standard antireflux measures. Surveillance endoscopy is due in one year.  Problem # 3:  COLONIC POLYPS, HX OF (ICD-V12.72) Adenomatous colon polyps diagnosed in 2003. See problem #1.  Patient Instructions: 1)  Colonoscopy brochure given.  2)  Conscious Sedation brochure given.  3)  Copy sent to : [name], MD 4)  The medication list was reviewed and reconciled.  All changed / newly prescribed medications were explained.  A complete medication list was provided to the patient / caregiver.  Prescriptions: MOVIPREP 100 GM  SOLR (PEG-KCL-NACL-NASULF-NA ASC-C) As per prep instructions.  #1 x 0   Entered by:   Marlon Pel CMA ([place])   Authorized by:   Ladene Artist MD [facility]   Signed by:   Ladene Artist MD FACG on 11/14/2009   Method used:   Electronically to        [facility] [phone]* (retail)       [place], [place]  [id number]       Ph: [phone] or [phone]       Fax: [phone]   RxID:   [id number]

## 2010-10-10 NOTE — Progress Notes (Signed)
Summary: [facility]   Phone Note Outgoing Call   Call placed by: Marlon Pel CMA [name] [name]),  November 14, 2009 1:22 PM Call placed to: Patient Summary of Call: Called pt to inform him that I called [facility] about his rx not being recieved according to them. They said that the order was revieved by me on 11-06-09 and the product was shipped out today. The order # is [id number] UPS first class. Pt verbalized understanding to call them if he still does not recieve it in the mail. Initial call taken by: Marlon Pel CMA ([place]),  November 14, 2009 1:25 PM

## 2010-10-10 NOTE — Miscellaneous (Signed)
Summary: Sulfasalazine refill  Clinical Lists Changes Pt walked into the office today and states he called [facility] and they states they have not recieved the rx from our office. I told him Dr. Judi Cong office already faxed it but I will refax it and also send him a local rx to get him through until he recieves it in the mail. Medications: Rx of AZULFIDINE 500 MG TABS (SULFASALAZINE) Take 1 tablet by mouth two times a day;  #180 x 3;  Signed;  Entered by: Marlon Pel CMA (AAMA);  Authorized by: Ladene Artist MD [facility];  Method used: Electronically to [facility] [address] [place]  [address], Ph: [phone], Fax: [phone]    Prescriptions: AZULFIDINE 500 MG TABS (SULFASALAZINE) Take 1 tablet by mouth two times a day  #180 x 3   Entered by:   Marlon Pel CMA ([place])   Authorized by:   Ladene Artist MD [facility]   Signed by:   Marlon Pel CMA ([place]) on 11/06/2009   Method used:   Electronically to        [facility] (mail-order)       [place]  [address]       Ph: [phone]       Fax: [phone]   RxID:   [id number]   Appended Document: Sulfasalazine refill    Clinical Lists Changes  Medications: Rx of AZULFIDINE 500 MG TABS (SULFASALAZINE) Take 1 tablet by mouth two times a day;  #60 x 0;  Signed;  Entered by: Marlon Pel CMA (AAMA);  Authorized by: Ladene Artist MD [facility];  Method used: Electronically to [facility] [phone]*, [place], [place], [place], [place]  [id number], Ph: [phone] or [phone], Fax: [phone]    Prescriptions: AZULFIDINE 500 MG TABS (SULFASALAZINE) Take 1 tablet by mouth two times a day  #60 x 0   Entered by:   Marlon Pel CMA ([place])   Authorized by:   Ladene Artist MD [facility]   Signed by:   Marlon Pel CMA ([place]) on 11/06/2009   Method used:   Electronically to        [facility] [phone]*  (retail)       [place], [place]  [id number]       Ph: [phone] or [phone]       Fax: [phone]   RxID:   [id number]

## 2010-10-10 NOTE — Progress Notes (Signed)
Summary: Triage   Phone Note Call from Patient Call back at Home Phone [phone]   Caller: Patient Call For: Dr. Fuller Plan Summary of Call: Jury Duty on 05-27-10 and has colitis, does not feel like he could serve on Jury Duty and requesting a letter Initial call taken by: Webb Laws,  May 08, 2010 1:19 PM  Follow-up for Phone Call        Dr Fuller Plan are you willing to send him a jury duty excuse.  Last office was in March and he was doing well at that  time.  No phone calls with reports of UC flare since.  Please advise.  Follow-up by: Barb Merino RN, [place],  May 08, 2010 1:49 PM  Additional Follow-up for Phone Call Additional follow up Details #1::        Can give him a form letter for jury excuse? Additional Follow-up by: Ladene Artist MD Marval Regal,  May 08, 2010 2:04 PM    Additional Follow-up for Phone Call Additional follow up Details #2::    Patient notified letter is ready. Follow-up by: Barb Merino RN, [place],  May 08, 2010 3:52 PM

## 2010-10-10 NOTE — Letter (Signed)
Summary: [facility]  [facility]   Imported By: Phillis Knack 11/02/2009 10:09:32  _____________________________________________________________________  External Attachment:    Type:   Image     Comment:   External Document

## 2010-10-10 NOTE — Progress Notes (Signed)
  Phone Note Refill Request  on October 16, 2009 9:50 AM  Refills Requested: Medication #1:  LISINOPRIL-HYDROCHLOROTHIAZIDE 20-25 MG TABS 1po once daily   Dosage confirmed as above?Dosage Confirmed   Notes: [facility] Initial call taken by: Sharon Seller,  October 16, 2009 9:51 AM    Prescriptions: OMEPRAZOLE 40 MG CPDR (OMEPRAZOLE) Take 1 tablet by mouth two times a day  #180 x 3   Entered by:   Sharon Seller   Authorized by:   Biagio Borg MD   Signed by:   Sharon Seller on 10/16/2009   Method used:   Faxed to ...       [facility] (mail-order)       [place]  [address]       Ph: [phone]       Fax: [phone]   RxID:   [id number] SIMVASTATIN 80 MG TABS (SIMVASTATIN) 1po once daily  #90 x 3   Entered by:   Sharon Seller   Authorized by:   Biagio Borg MD   Signed by:   Sharon Seller on 10/16/2009   Method used:   Faxed to ...       [facility]* (mail-order)       [place]  [address]       Ph: [phone]       Fax: [phone]   RxID:   [id number] LISINOPRIL-HYDROCHLOROTHIAZIDE 20-25 MG TABS (LISINOPRIL-HYDROCHLOROTHIAZIDE) 1po once daily  #90 x 3   Entered by:   Sharon Seller   Authorized by:   Biagio Borg MD   Signed by:   Sharon Seller on 10/16/2009   Method used:   Faxed to ...       [facility] (mail-order)       [place]  [address]       Ph: [phone]       Fax: [phone]   RxID:   [id number]

## 2010-10-10 NOTE — Letter (Signed)
Summary: [facility] Instructions  [facility]  [place], [place] [address]   Phone: [phone]  Fax: [phone]       CAMDIN HEGNER    1941/05/30    MRN: [id number]        Procedure Day /Date: Tuesday April 12th, 2011     Arrival Time: 1:30pm     Procedure Time: 2:30pm     Location of Procedure:                    _x _  [facility] (4th Floor)                        [place]   Starting 5 days prior to your procedure  12/13/09 do not eat nuts, seeds, popcorn, corn, beans, peas,  salads, or any raw vegetables.  Do not take any fiber supplements (e.g. Metamucil, Citrucel, and Benefiber).  THE DAY BEFORE YOUR PROCEDURE         DATE:  12/17/09  DAY:  Monday   1.  Drink clear liquids the entire day-NO SOLID FOOD  2.  Do not drink anything colored red or purple.  Avoid juices with pulp.  No orange juice.  3.  Drink at least 64 oz. (8 glasses) of fluid/clear liquids during the day to prevent dehydration and help the prep work efficiently.  CLEAR LIQUIDS INCLUDE: Water Jello Ice Popsicles Tea (sugar ok, no milk/cream) Powdered fruit flavored drinks Coffee (sugar ok, no milk/cream) Gatorade Juice: apple, white grape, white cranberry  Lemonade Clear bullion, consomm, broth Carbonated beverages (any kind) Strained chicken noodle soup Hard Candy                             4.  In the morning, mix first dose of MoviPrep solution:    Empty 1 Pouch A and 1 Pouch B into the disposable container    Add lukewarm drinking water to the top line of the container. Mix to dissolve    Refrigerate (mixed solution should be used within 24 hrs)  5.  Begin drinking the prep at 5:00 p.m. The MoviPrep container is divided by 4 marks.   Every 15 minutes drink the solution down to the next mark (approximately 8 oz) until the full liter is complete.   6.  Follow completed prep with 16 oz of clear liquid of your choice  (Nothing red or purple).  Continue to drink clear liquids until bedtime.  7.  Before going to bed, mix second dose of MoviPrep solution:    Empty 1 Pouch A and 1 Pouch B into the disposable container    Add lukewarm drinking water to the top line of the container. Mix to dissolve    Refrigerate  THE DAY OF YOUR PROCEDURE      DATE:  12/18/09  DAY:  Tuesday  Beginning at  9:30 a.m. (5 hours before procedure):         1. Every 15 minutes, drink the solution down to the next mark (approx 8 oz) until the full liter is complete.  2. Follow completed prep with 16 oz. of clear liquid of your choice.    3. You may drink clear liquids until 12:30pm   (2 HOURS BEFORE PROCEDURE).   MEDICATION INSTRUCTIONS  Unless otherwise instructed, you should take regular prescription medications with a small sip of water  as early as possible the morning of your procedure.         OTHER INSTRUCTIONS  You will need a responsible adult at least 70 years of age to accompany you and drive you home.   This person must remain in the waiting room during your procedure.  Wear loose fitting clothing that is easily removed.  Leave jewelry and other valuables at home.  However, you may wish to bring a book to read or  an iPod/MP3 player to listen to music as you wait for your procedure to start.  Remove all body piercing jewelry and leave at home.  Total time from sign-in until discharge is approximately 2-3 hours.  You should go home directly after your procedure and rest.  You can resume normal activities the  day after your procedure.  The day of your procedure you should not:   Drive   Make legal decisions   Operate machinery   Drink alcohol   Return to work  You will receive specific instructions about eating, activities and medications before you leave.    The above instructions have been reviewed and explained to me by   Estill Bamberg.     I fully understand and can verbalize these  instructions _____________________________ Date _________

## 2010-10-10 NOTE — Miscellaneous (Signed)
Summary: Immunization Entry   Immunization History:  Influenza Immunization History:    Influenza:  historical (07/10/2010)  [facility] Fluvirin [employer]fg. [employer] Site left deltoid IM Lot #[id number]   Exp. 01/2011 Date Administered 07/10/2010

## 2010-10-10 NOTE — Progress Notes (Signed)
Summary: rx request  Phone Note Call from Patient Call back at Home Phone [phone]   Caller: Patient Summary of Call: pt called requestinf [facility]ills to [facility] Initial call taken by: Crissie Sickles, CMA,  October 29, 2009 12:01 PM    Prescriptions: AZULFIDINE 500 MG TABS (SULFASALAZINE) Take 1 tablet by mouth two times a day  #180 x 3   Entered by:   Crissie Sickles, CMA   Authorized by:   Biagio Borg MD   Signed by:   Crissie Sickles, CMA on 10/29/2009   Method used:   Faxed to ...       [facility]             , [place]         Ph:        Fax: [phone]   RxID:   [id number]

## 2010-10-10 NOTE — Assessment & Plan Note (Signed)
Summary: HEADACHES/#/CD   Vital Signs:  Patient profile:   70 year old male Height:      68 inches Weight:      238.75 pounds BMI:     36.43 O2 Sat:      98 % on Room air Temp:     96.8 degrees F oral Pulse rate:   74 / minute BP sitting:   114 / 62  (left arm) Cuff size:   large  Vitals Entered ByMarland Kitchen Shirlean Mylar Ewing (November 13, 2009 1:45 PM)  O2 Flow:  Room air CC: Headaches/RE   Primary Care Provider:  [name], MD  CC:  Headaches/RE.  History of Present Illness: here to f/u labs - had some vit d deficiency,  trazodone caused side effect so did not take more then 3 pills - still with problems with sleep chronically ;  gets headahces during the day when he gets lack of sleep at night;  most trouble is getting to sleep as staying asleep is not much problem at all.  Denies increased depressive symtpoms, pain or increased stressors recent., and no anxeity increased or panic.  Pt denies CP, sob, doe, wheezing, orthopnea, pnd, worsening LE edema, palps, dizziness or syncope   Pt denies new neuro symptoms such as headache, facial or extremity weakness     Problems Prior to Update: 1)  Headache  (ICD-784.0) 2)  Vitamin D Deficiency  (ICD-268.9) 3)  Abdominal Pain, Unspecified Site  (ICD-789.00) 4)  Depression  (ICD-311) 5)  Hip Pain, Right  (ICD-719.45) 6)  Shoulder Pain, Left  (ICD-719.41) 7)  Sinusitis- Acute-nos  (ICD-461.9) 8)  Breast Hypertrophy  (ICD-611.1) 9)  Leg Pain, Bilateral  (ICD-729.5) 10)  Hepatotoxicity, Drug-induced, Risk of  (ICD-V58.69) 11)  Back Pain  (ICD-724.5) 12)  Mastitis  (ICD-611.0) 13)  Insomnia-sleep Disorder-unspec  (ICD-780.52) 14)  Chest Pain  (ICD-786.50) 15)  Colonic Polyps, Hx of  (ICD-V12.72) 16)  Glucose Intolerance  (ICD-271.3) 17)  Abdominal Pain, Generalized  (ICD-789.07) 18)  Preventive Health Care  (ICD-V70.0) 19)  Neck Pain  (ICD-723.1) 20)  Ulcerative Colitis-left Side  (ICD-556.5) 21)  Anal Fissure  (ICD-565.0) 22)  Carbuncle, Neck   (ICD-680.1) 23)  Preventive Health Care  (ICD-V70.0) 24)  Barretts Esophagus  (ICD-530.85) 25)  Colonic Polyps, Adenomatous, Hx of  (ICD-V12.72) 26)  Hernia, Umbilical W/obstruction w/o Gangrene  (ICD-552.1) 27)  Low Back Pain  (ICD-724.2) 28)  Proctosigmoiditis, Ulcerative  (ICD-556.3) 29)  Hypertension  (ICD-401.9) 30)  Hyperlipidemia  (ICD-272.4) 31)  Nephrolithiasis, Hx of  (ICD-V13.01) 32)  Gerd  (ICD-530.81) 33)  Diverticulosis, Colon  (ICD-562.10)  Medications Prior to Update: 1)  Lisinopril-Hydrochlorothiazide 20-25 Mg Tabs (Lisinopril-Hydrochlorothiazide) .[name].. 1po Once Daily 2)  Ecotrin Low Strength 81 Mg  Tbec (Aspirin) .[name].. 1 By Mouth Qd 3)  Trazodone Hcl 50 Mg Tabs (Trazodone Hcl) .[name].. 1 - 2 By Mouth At Bedtime As Needed 4)  Azulfidine 500 Mg Tabs (Sulfasalazine) .... Take 1 Tablet By Mouth Two Times A Day 5)  Simvastatin 80 Mg Tabs (Simvastatin) .[name].. 1po Once Daily 6)  Omeprazole 40 Mg Cpdr (Omeprazole) .... Take 1 Tablet By Mouth Two Times A Day 7)  Tramadol Hcl 50 Mg Tabs (Tramadol Hcl) .[name].. 1 - 2 By Mouth Four Times Per Day As Needed Pain 8)  Citalopram Hydrobromide 10 Mg Tabs (Citalopram Hydrobromide) .[name].. 1po Once Daily 9)  Promethazine Hcl 25 Mg Tabs (Promethazine Hcl) .[name].. 1 By Mouth Q 6 Hrs As Needed Nausea  Current Medications (  verified): 1)  Lisinopril-Hydrochlorothiazide 20-25 Mg Tabs (Lisinopril-Hydrochlorothiazide) .[name].. 1po Once Daily 2)  Ecotrin Low Strength 81 Mg  Tbec (Aspirin) .[name].. 1 By Mouth Qd 3)  Azulfidine 500 Mg Tabs (Sulfasalazine) .... Take 1 Tablet By Mouth Two Times A Day 4)  Simvastatin 80 Mg Tabs (Simvastatin) .[name].. 1po Once Daily 5)  Omeprazole 40 Mg Cpdr (Omeprazole) .... Take 1 Tablet By Mouth Two Times A Day 6)  Tramadol Hcl 50 Mg Tabs (Tramadol Hcl) .[name].. 1 - 2 By Mouth Four Times Per Day As Needed Pain 7)  Citalopram Hydrobromide 10 Mg Tabs (Citalopram Hydrobromide) .[name].. 1po Once Daily 8)  Promethazine Hcl 25 Mg Tabs (Promethazine Hcl) .[name]..  1 By Mouth Q 6 Hrs As Needed Nausea 9)  Zolpidem Tartrate 10 Mg Tabs (Zolpidem Tartrate) .[name].. 1 By Mouth At Bedtime As Needed  Allergies (verified): 1)  ! * Ivp Dye 2)  * Trazodone  Past History:  Past Surgical History: Last updated: 03/16/2009 Left knee arthroscopy Bilateral inguinal hernia with mesh and umblical hernia repairs 09/2007 Anal fissure surgery  Social History: Last updated: 04/19/2009 Former Smoker Alcohol use-yes  1 beer occ Married,  3 children retired - truck Geophysicist/field seismologist  Risk Factors: Smoking Status: quit (06/28/2007)  Past Medical History: Diverticulosis, colon GERD Nephrolithiasis, hx of Hyperlipidemia Hypertension Ulcerative proctosigmoiditis 1997 Low back pain Barrett's esophagus 1997 Adenomatous Colon Polyps 01/2002 glucose intolerance/borderline DM H. pylori gastritis 1997 Depression vit d  deficiency  Review of Systems       all otherwise negative per pt -    Physical Exam  General:  alert and overweight-appearing.   Head:  normocephalic and atraumatic.   Eyes:  vision grossly intact, pupils equal, and pupils round.   Ears:  R ear normal and L ear normal.   Nose:  no external deformity and no nasal discharge.   Mouth:  no gingival abnormalities and pharynx pink and moist.   Neck:  supple and no masses.   Lungs:  normal respiratory effort and normal breath sounds.   Heart:  normal rate and regular rhythm.   Extremities:  no edema, no erythema  Neurologic:  alert & oriented X3, cranial nerves II-XII intact, and strength normal in all extremities.   Psych:  not depressed appearing and moderately anxious.     Impression & Recommendations:  Problem # 1:  HEADACHE (ICD-784.0)  His updated medication list for this problem includes:    Ecotrin Low Strength 81 Mg Tbec (Aspirforin) .[name]... 1 by mouth once daily     Tramadol Hcl 50 Mg Tabs (Tramadol hcl) .[name]... 1 - 2 by mouth four times per day as needed pain for better sleep hygeine, tylenol as  needed   Problem # 2:  VITAMIN D DEFICIENCY (ICD-268.9) to start vit d 1000 units per day  Problem # 3:  INSOMNIA-SLEEP DISORDER-UNSPEC (ICD-780.52)  go back to zolpidem, did not "perfect" but he believes now much better than anything else at this time, ok for rx today  His updated medication list for this problem includes:    Zolpidem Tartrate 10 Mg Tabs (Zolpidem tartrate) .[name]... 1 by mouth at bedtime as needed  Problem # 4:  HYPERTENSION (ICD-401.9)  His updated medication list for this problem includes:    Lisinopril-hydrochlorothiazide 20-25 Mg Tabs (Lisinopril-hydrochlorothiazide) .[name]... 1po once daily  BP today: 114/62 Prior BP: 112/60 (10/16/2009)  Labs Reviewed: K+: 4.6 (10/16/2009) Creat: : 1.1 (10/16/2009)   Chol: 138 (10/16/2009)   HDL: 32.70 (10/16/2009)   LDL: 86 (  10/16/2009)   TG: 97.0 (10/16/2009) .stable  Complete Medication List: 1)  Lisinopril-hydrochlorothiazide 20-25 Mg Tabs (Lisinopril-hydrochlorothiazide) .[name].. 1po once daily 2)  Ecotrin Low Strength 81 Mg Tbec (Aspirin) .[name].. 1 by mouth qd 3)  Azulfidine 500 Mg Tabs (Sulfasalazine) .... Take 1 tablet by mouth two times a day 4)  Simvastatin 80 Mg Tabs (Simvastatin) .[name].. 1po once daily 5)  Omeprazole 40 Mg Cpdr (Omeprazole) .... Take 1 tablet by mouth two times a day 6)  Tramadol Hcl 50 Mg Tabs (Tramadol hcl) .[name].. 1 - 2 by mouth four times per day as needed pain 7)  Citalopram Hydrobromide 10 Mg Tabs (Citalopram hydrobromide) .[name].. 1po once daily 8)  Promethazine Hcl 25 Mg Tabs (Promethazine hcl) .[name].. 1 by mouth q 6 hrs as needed nausea 9)  Zolpidem Tartrate 10 Mg Tabs (Zolpidem tartrate) .[name].. 1 by mouth at bedtime as needed  Patient Instructions: 1)  stop the trazodone 2)  start the generic ambien for sleep 3)  start the Vit D 0- OTC - 1000 units per day 4)  Continue all previous medications as before this visit  5)  Please schedule a follow-up appointment in 5 months. Prescriptions: ZOLPIDEM TARTRATE 10  MG TABS (ZOLPIDEM TARTRATE) 1 by mouth at bedtime as needed  #30 x 5   Entered and Authorized by:   Biagio Borg MD   Signed by:   Biagio Borg MD on 11/13/2009   Method used:   Print then Give to Patient   RxID:   [id number]

## 2010-10-10 NOTE — Assessment & Plan Note (Signed)
Summary: YEARLY PER DAHLIA--STC   Vital Signs:  Patient profile:   70 year old male Height:      69 inches Weight:      242 pounds BMI:     35.87 O2 Sat:      98 % on Room air Temp:     96.4 degrees F oral Pulse rate:   76 / minute BP sitting:   112 / 60  (left arm) Cuff size:   large  Vitals Entered ByMarland Kitchen Shirlean Mylar Ewing (October 16, 2009 8:38 AM)  O2 Flow:  Room air  CC: yearly/RE   Primary Care Provider:  [name], MD  CC:  yearly/RE.  History of Present Illness: here for yearly,  c/o increased left lateral shoulder pain , worse to lie down on the left side with pain radiating to the upperback and somewhat to the right shoudler;  also has seen gen surgeon and does not like his demeanor and would like a different surgeon for f/u of right groin swelling and pain (no hernia per surgeon) - ? what sounds like vas deferens infection but pt does not think so;  amabien not working as well for sleep lately with more stress lately;  also with recurrent nausea and hx of colitis and occasionl immodium use (uses most days);  has chronic diarrhea/loose stools (still sees dr stark);  hard to excercise due to right groin pain (even stat bike) and unable to lose wt;  wife thinks he is depressed but he initially denies then admits he has symptoms;  Pt denies CP, sob, doe, wheezing, orthopnea, pnd, worsening LE edema, palps, dizziness or syncope   Pt denies new neuro symptoms such as headache, facial or extremity weakness   Problems Prior to Update: 1)  Abdominal Pain, Unspecified Site  (ICD-789.00) 2)  Depression  (ICD-311) 3)  Hip Pain, Right  (ICD-719.45) 4)  Shoulder Pain, Left  (ICD-719.41) 5)  Sinusitis- Acute-nos  (ICD-461.9) 6)  Breast Hypertrophy  (ICD-611.1) 7)  Leg Pain, Bilateral  (ICD-729.5) 8)  Hepatotoxicity, Drug-induced, Risk of  (ICD-V58.69) 9)  Back Pain  (ICD-724.5) 10)  Mastitis  (ICD-611.0) 11)  Insomnia-sleep Disorder-unspec  (ICD-780.52) 12)  Chest Pain   (ICD-786.50) 13)  Colonic Polyps, Hx of  (ICD-V12.72) 14)  Glucose Intolerance  (ICD-271.3) 15)  Abdominal Pain, Generalized  (ICD-789.07) 16)  Preventive Health Care  (ICD-V70.0) 17)  Neck Pain  (ICD-723.1) 18)  Ulcerative Colitis-left Side  (ICD-556.5) 19)  Anal Fissure  (ICD-565.0) 20)  Carbuncle, Neck  (ICD-680.1) 21)  Preventive Health Care  (ICD-V70.0) 22)  Barretts Esophagus  (ICD-530.85) 23)  Colonic Polyps, Adenomatous, Hx of  (ICD-V12.72) 24)  Hernia, Umbilical W/obstruction w/o Gangrene  (ICD-552.1) 25)  Low Back Pain  (ICD-724.2) 26)  Proctosigmoiditis, Ulcerative  (ICD-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 1po Once Daily 2)  Ecotrin Low Strength 81 Mg  Tbec (Aspirin) .[name].. 1 By Mouth Qd 3)  Zolpidem Tartrate 10 Mg  Tabs (Zolpidem Tartrate) .[name].. 1 By Mouth At Bedtime As Needed 4)  Azulfidine 500 Mg Tabs (Sulfasalazine) .... Take 1 Tablet By Mouth Two Times A Day 5)  Simvastatin 80 Mg Tabs (Simvastatin) .[name].. 1po Once Daily 6)  Omeprazole 40 Mg Cpdr (Omeprazole) .... Take 1 Tablet By Mouth Two Times A Day 7)  Amoxicillin-Pot Clavulanate 875-125 Mg Tabs (Amoxicillin-Pot Clavulanate) .[name].. 1 By Mouth Two Times A Day  Current Medications (verified): 1)  Lisinopril-Hydrochlorothiazide 20-25 Mg Tabs (Lisinopril-Hydrochlorothiazide) .[name].. 1po Once Daily 2)  Ecotrin Low Strength 81 Mg  Tbec (Aspirin) .[name].. 1 By Mouth Qd 3)  Trazodone Hcl 50 Mg Tabs (Trazodone Hcl) .[name].. 1 - 2 By Mouth At Bedtime As Needed 4)  Azulfidine 500 Mg Tabs (Sulfasalazine) .... Take 1 Tablet By Mouth Two Times A Day 5)  Simvastatin 80 Mg Tabs (Simvastatin) .[name].. 1po Once Daily 6)  Omeprazole 40 Mg Cpdr (Omeprazole) .... Take 1 Tablet By Mouth Two Times A  Day 7)  Tramadol Hcl 50 Mg Tabs (Tramadol Hcl) .[name].. 1 - 2 By Mouth Four Times Per Day As Needed Pain 8)  Citalopram Hydrobromide 10 Mg Tabs (Citalopram Hydrobromide) .[name].. 1po Once Daily 9)  Promethazine Hcl 25 Mg Tabs (Promethazine Hcl) .[name].. 1 By Mouth Q 6 Hrs As Needed Nausea  Allergies (verified): 1)  ! 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 pylori gastritis 1997 Depression  Review of Systems  The patient denies anorexia, fever, weight loss, vision loss, decreased hearing, hoarseness, chest pain, syncope, dyspnea on exertion, peripheral edema, prolonged cough, headaches, hemoptysis, melena, hematochezia, severe indigestion/heartburn, hematuria, incontinence, genital sores, muscle weakness, suspicious skin lesions, transient blindness, difficulty walking, depression, unusual weight change, abnormal bleeding, enlarged lymph nodes, and angioedema.         all otherwise negative per pt -  Physical Exam  General:  alert and overweight-appearing.   Head:  normocephalic and atraumatic.   Eyes:  vision grossly intact, pupils equal, and pupils round.   Ears:  R ear normal and L ear normal.   Nose:  no external deformity and no nasal discharge.   Mouth:  no gingival abnormalities and pharynx pink and moist.   Neck:  supple  and no masses.   Lungs:  normal respiratory effort and normal breath sounds.   Heart:  normal rate and regular rhythm.   Abdomen:  soft, non-tender, and normal bowel sounds.   Msk:  no joint tenderness and no joint swelling.  , right hipo with decreased flexion by approx 30 degrees with pain;  left shoudler with FROM but tender bursa area;   Extremities:  no edema, no erythema  Neurologic:  cranial nerves II-XII intact and strength normal in all extremities.   Skin:  color normal and no rashes.   Psych:  depressed affect and severely anxious.     Impression & Recommendations:  Problem # 1:  Preventive Health Care (ICD-V70.0)  Overall doing well, age appropriate education and counseling updated and referral for appropriate preventive services done unless declined, immunizations up to date or declined, diet counseling done if overweight, urged to quit smoking if smokes , most recent labs reviewed and current ordered if appropriate, ecg reviewed or declined (interpretation per ECG scanned in the EMR if done); information regarding Medicare Prevention requirements given  if appropriate   Orders: T-Vitamin D (25-Hydroxy) [phone]) EKG w/ Interpretation (93000) TLB-BMP (Basic Metabolic Panel-BMET) (99371-IRCVELF) TLB-CBC Platelet - w/Differential (85025-CBCD) TLB-Hepatic/Liver Function Pnl (80076-HEPATIC) TLB-Lipid Panel (80061-LIPID) TLB-PSA (Prostate Specific Antigen) (84153-PSA) TLB-TSH (Thyroid Stimulating Hormone) (84443-TSH) TLB-Udip ONLY (81003-UDIP)  Problem # 2:  SHOULDER PAIN, LEFT (ICD-719.41)  His updated medication list for this problem includes:    Ecotrin Low Strength 81 Mg Tbec (Aspirin) .[name]... 1 by mouth qd    Tramadol Hcl 50 Mg Tabs (Tramadol hcl) .[name]... 1 - 2 by mouth four times per day as needed pain  Orders: Orthopedic Surgeon Referral (Ortho Surgeon) for film today, treat as above, f/u any worsening signs or symptoms   Problem # 3:  HIP PAIN, RIGHT  (ICD-719.45)  His updated medication list for this problem includes:    Ecotrin Low Strength 81 Mg Tbec (Aspirin) .[name]... 1 by mouth qd    Tramadol Hcl 50 Mg Tabs (Tramadol hcl) .[name]... 1 - 2 by mouth four times per day as needed pain  Orders: T-Hip Comp Right Min 2 views (73510TC) Orthopedic Surgeon Referral (Ortho Surgeon) actually right groin pain , but I suspect related to right hip joint - for film todya, pain med, reassurance and ortho referral  Problem # 4:  INSOMNIA-SLEEP DISORDER-UNSPEC (ICD-780.52) for change of zolpidem to trazodone at bedtime as needed   Problem # 5:  DEPRESSION (ICD-311)  His updated medication list for this problem includes:    Trazodone Hcl 50 Mg Tabs (Trazodone hcl) .[name]... 1 - 2 by mouth at bedtime as needed    Citalopram Hydrobromide 10 Mg Tabs (Citalopram hydrobromide) .[name]... 1po once daily treat as above, f/u any worsening signs or symptoms   Problem # 6:  ABDOMINAL PAIN, UNSPECIFIED SITE (ICD-789.00)  His updated medication list for this problem includes:    Azulfidine 500 Mg Tabs (Sulfasalazine) .[name]... Take 1 tablet by mouth two times a day with tender to epigastrium, and left abd - suspect colitis uncontrolled given symtpoms, will need f/u with dr stark, as appears may need change in tx, is due for colonscopy and may need egd as well   Orders: Gastroenterology Referral (GI)  Complete Medication List: 1)  Lisinopril-hydrochlorothiazide 20-25 Mg Tabs (Lisinopril-hydrochlorothiazide) .[name].. 1po once daily 2)  Ecotrin Low Strength 81 Mg Tbec (Aspirin) .[name].. 1 by mouth qd 3)  Trazodone Hcl 50 Mg Tabs (Trazodone hcl) .[name].. 1 - 2 by mouth at bedtime as needed 4)  Azulfidine 500 Mg Tabs (Sulfasalazine) .... Take 1 tablet by mouth two times a day 5)  Simvastatin 80 Mg Tabs (Simvastatin) .[name].. 1po once daily 6)  Omeprazole 40 Mg Cpdr (Omeprazole) .... Take 1 tablet by mouth two times a day 7)  Tramadol Hcl 50 Mg Tabs (Tramadol hcl) .[name].. 1 - 2 by mouth four times  per day as needed pain 8)  Citalopram Hydrobromide 10 Mg Tabs (Citalopram hydrobromide) .[name].. 1po once daily 9)  Promethazine Hcl 25 Mg Tabs (Promethazine hcl) .[name].. 1 by mouth q 6 hrs as needed nausea  Patient Instructions: 1)  Please take all new medications as prescribed 2)  Continue all previous medications as before this visit  3)  You will be contacted about the referral(s) to: orthopedic, and dr stark 4)  Please go to the Lab in the basement for your blood and/or urine tests today 5)  Please go to Radiology in the basement level for your X-Ray today  6)  Please schedule a follow-up appointment in 6 months or sooner  if needed Prescriptions: PROMETHAZINE HCL 25 MG TABS (PROMETHAZINE HCL) 1 by mouth q 6 hrs as needed nausea  #40 x 1   Entered and Authorized by:   Biagio Borg MD   Signed by:   Biagio Borg MD on 10/16/2009   Method used:   Print then Give to Patient   RxID:   [id number] CITALOPRAM HYDROBROMIDE 10 MG TABS (CITALOPRAM HYDROBROMIDE) 1po once daily  #90 x 3   Entered and Authorized by:   Biagio Borg MD   Signed by:   Biagio Borg MD on 10/16/2009   Method used:   Print then Give to Patient   RxID:   [id number] SIMVASTATIN 80 MG TABS (SIMVASTATIN) 1po once daily  #90 x 3   Entered and Authorized by:   Biagio Borg MD   Signed by:   Biagio Borg MD on 10/16/2009   Method used:   Print then Give to Patient   RxID:   [id number] TRAZODONE HCL 50 MG TABS (TRAZODONE HCL) 1 - 2 by mouth at bedtime as needed  #60 x 5   Entered and Authorized by:   Biagio Borg MD   Signed by:   Biagio Borg MD on 10/16/2009   Method used:   Print then Give to Patient   RxID:   [id number] LISINOPRIL-HYDROCHLOROTHIAZIDE 20-25 MG TABS (LISINOPRIL-HYDROCHLOROTHIAZIDE) 1po once daily  #90 x 3   Entered and Authorized by:   Biagio Borg MD   Signed by:   Biagio Borg MD on 10/16/2009   Method used:   Print then Give to Patient   RxID:   [id number] TRAMADOL HCL 50 MG  TABS (TRAMADOL HCL) 1 - 2 by mouth four times per day as needed pain  #60 x 1   Entered and Authorized by:   Biagio Borg MD   Signed by:   Biagio Borg MD on 10/16/2009   Method used:   Print then Give to Patient   RxID:   [id number]

## 2010-10-10 NOTE — Letter (Signed)
Summary: [facility]  [place], [place] [address]   Phone: [phone]  Fax: [phone]    May 08, 2010  [address]  [place], [place] [address]  [id number]     Dear Lessie Dings:   [name] of [place], [place], [place] has just informed me that he has been chosen to serve on the jury beginning the 19 th day of September. 2011.  Mr Lopezmartinez  is a patient under my care with the diagnosis of Ulcerative Colitis. I do not feel that Mr Hilligoss should serve on the jury. I would like to request that Mr Strohecker be excused from jury duty permanently.  Your consideration of this matter is greatly appreciated.  Respectfully,   Lucio Edward, MD [facility]

## 2010-10-10 NOTE — Progress Notes (Signed)
Summary: Records request from [facility]  Request for records received from [facility]. Request forwarded to [employer]. Dena Chavis  November 01, 2009 4:11 PM

## 2010-10-10 NOTE — Letter (Signed)
Summary: Patient Notice- Colon Biospy Results  [facility]  [address] [place], [place] [address]   Phone: [phone]  Fax: [phone]        December 20, 2009 MRN: [id number]    [facility] [place], [place]  [address]    Dear [name] [name],  I am pleased to inform you that the biopsies taken during your recent colonoscopy did not show any evidence of cancer upon pathologic examination. The biopsies were normal.  Continue with the treatment plan as outlined on the day of your      exam.  You should have a repeat colonoscopy examination in 2 years.  Please call us if you are having persistent problems or have questions about your condition that have not been fully answered at this time.  Sincerely,  Ladene Artist MD [facility]  This letter has been electronically signed by your physician.  Appended Document: Patient Notice- Colon Biospy Results letter mailed 4.20.11.

## 2010-10-10 NOTE — Assessment & Plan Note (Signed)
Summary: 6 MO ROV / NWS  #   Vital Signs:  Patient profile:   70 year old male Height:      68 inches Weight:      245 pounds BMI:     37.39 O2 Sat:      97 % on Room air Temp:     98 degrees F oral Pulse rate:   79 / minute BP sitting:   118 / 60  (left arm) Cuff size:   large  Vitals Entered By: Shirlean Mylar Ewing CMA (AAMA) (April 16, 2010 9:06 AM)  O2 Flow:  Room air  CC: 6 month ROV/RE   Primary Care Edelin Fryer:  [name], MD  CC:  6 month ROV/RE.  History of Present Illness: overall doing ok, but could not toerate the statin due to myalgias and had to stop, still tyring to follow lower chol diet.  Pt denies CP, sob, doe, wheezing, orthopnea, pnd, worsening LE edema, palps, dizziness or syncope  Pt denies new neuro symptoms such as headache, facial or extremity weakness  No fever, wt loss, night sweats, loss of appetite or other constitutional symptoms  Denies polydipsia, polyuria or elevated CBG's.  Overall good complaicne with meds, good tolerance  Problems Prior to Update: 1)  Universal Ulcerative Colitis  (ICD-556.6) 2)  Headache  (ICD-784.0) 3)  Vitamin D Deficiency  (ICD-268.9) 4)  Abdominal Pain, Unspecified Site  (ICD-789.00) 5)  Depression  (ICD-311) 6)  Hip Pain, Right  (ICD-719.45) 7)  Shoulder Pain, Left  (ICD-719.41) 8)  Sinusitis- Acute-nos  (ICD-461.9) 9)  Breast Hypertrophy  (ICD-611.1) 10)  Leg Pain, Bilateral  (ICD-729.5) 11)  Hepatotoxicity, Drug-induced, Risk of  (ICD-V58.69) 12)  Back Pain  (ICD-724.5) 13)  Mastitis  (ICD-611.0) 14)  Insomnia-sleep Disorder-unspec  (ICD-780.52) 15)  Chest Pain  (ICD-786.50) 16)  Colonic Polyps, Hx of  (ICD-V12.72) 17)  Glucose Intolerance  (ICD-271.3) 18)  Abdominal Pain, Generalized  (ICD-789.07) 19)  Preventive Health Care  (ICD-V70.0) 20)  Neck Pain  (ICD-723.1) 21)  Ulcerative Colitis-left Side  (ICD-556.5) 22)  Anal Fissure  (ICD-565.0) 23)  Carbuncle, Neck  (ICD-680.1) 24)  Preventive Health Care   (ICD-V70.0) 25)  Barretts Esophagus  (ICD-530.85) 26)  Colonic Polyps, Adenomatous, Hx of  (ICD-V12.72) 27)  Hernia, Umbilical W/obstruction w/o Gangrene  (ICD-552.1) 28)  Low Back Pain  (ICD-724.2) 29)  Proctosigmoiditis, 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 1po Once Daily 2)  Ecotrin Low Strength 81 Mg  Tbec (Aspirin) .[name].. 1 By Mouth Qd 3)  Azulfidine 500 Mg Tabs (Sulfasalazine) .... Take 1 Tablet By Mouth Two Times A Day 4)  Simvastatin 80 Mg Tabs (Simvastatin) .[name].. 1po Once Daily 5)  Omeprazole 40 Mg Cpdr (Omeprazole) .... Take 1 Tablet By Mouth Two Times A Day 6)  Tramadol Hcl 50 Mg Tabs (Tramadol Hcl) .[name].. 1 - 2 By Mouth Four Times Per Day As Needed Pain 7)  Citalopram Hydrobromide 10 Mg Tabs (Citalopram Hydrobromide) .[name].. 1po Once Daily 8)  Promethazine Hcl 25 Mg Tabs (Promethazine Hcl) .[name].. 1 By Mouth As Needed Nausea 9)  Zolpidem Tartrate 10 Mg Tabs (Zolpidem Tartrate) .[name].. 1 By Mouth At Bedtime As Needed  Current Medications (verified): 1)  Lisinopril-Hydrochlorothiazide 20-25 Mg Tabs (Lisinopril-Hydrochlorothiazide) .[name].. 1po Once Daily 2)  Ecotrin Low Strength 81 Mg  Tbec (Aspirin) .[name].. 1 By Mouth Qd 3)  Azulfidine 500 Mg Tabs (Sulfasalazine) .... Take 1 Tablet By Mouth Two Times A Day 4)  Simvastatin 80 Mg Tabs (Simvastatin) .[name].. 1po Once Daily 5)  Omeprazole 40 Mg Cpdr (Omeprazole) .... Take 1 Tablet By Mouth Two Times A Day 6)  Tramadol Hcl 50 Mg Tabs (Tramadol Hcl) .[name].. 1 - 2 By Mouth Four Times Per Day As Needed Pain 7)  Citalopram Hydrobromide 10 Mg Tabs (Citalopram Hydrobromide) .[name].. 1po Once Daily 8)  Promethazine Hcl 25 Mg Tabs (Promethazine Hcl) .[name].. 1 By Mouth As Needed Nausea 9)  Zolpidem  Tartrate 10 Mg Tabs (Zolpidem Tartrate) .[name].. 1 By Mouth At Bedtime As Needed 10)  Welchol 3.75 Gm Pack (Colesevelam Hcl) .[name].. 1 Pkt By Mouth Once Daily in Water  Allergies (verified): 1)  ! * Ivp Dye 2)  ! * Simvastatin 3)  * Trazodone  Past History:  Past Medical History: Last updated: 11/13/2009 Diverticulosis, colon GERD Nephrolithiasis, hx of Hyperlipidemia Hypertension Ulcerative proctosigmoiditis 1997 Low back pain Barrett's esophagus 1997 Adenomatous Colon Polyps 01/2002 glucose intolerance/borderline DM H. pylori gastritis 1997 Depression vit d  deficiency  Past Surgical History: Last updated: 03/16/2009 Left knee arthroscopy Bilateral inguinal hernia with mesh and umblical hernia repairs 09/2007 Anal fissure surgery  Social History: Last updated: 11/14/2009 Former Smoker Alcohol use-yes  1 beer occ Married,  3 children retired - truck Geophysicist/field seismologist Daily Caffeine Use one cup coffee day  Risk Factors: Smoking Status: quit (06/28/2007)  Review of Systems       .all otherwise negative per pt -     Impression & Recommendations:  Problem # 1:  GLUCOSE INTOLERANCE (ICD-271.3) asympt,  Continue all previous medications as before this visit  - does not need OHA at this time, cont to focus on diet, excercise adn wt loss, recent labs reviewed with pt  Problem # 2:  HYPERTENSION (ICD-401.9)  His updated medication list for this problem includes:    Lisinopril-hydrochlorothiazide 20-25 Mg Tabs (Lisinopril-hydrochlorothiazide) .[name]... 1po once daily  BP today: 118/60 Prior BP: 116/62 (11/14/2009)  Labs Reviewed: K+: 5.0 (04/12/2010) Creat: : 1.0 (04/12/2010)   Chol: 241 (04/12/2010)   HDL: 30.10 (04/12/2010)   LDL: 86 (10/16/2009)   TG: 158.0 (04/12/2010) stable overall by hx and exam, ok to continue meds/tx as is   Problem # 3:  HYPERLIPIDEMIA (ICD-272.4)  His updated medication list for this problem includes:    Simvastatin 80 Mg Tabs (Simvastatin) .[name]... 1po  once daily    Welchol 3.75 Gm Pack (Colesevelam hcl) .[name]... 1 pkt by mouth once daily in water  Labs Reviewed: SGOT: 26 (04/12/2010)   SGPT: 23 (04/12/2010)   HDL:30.10 (04/12/2010), 32.70 (10/16/2009)  LDL:86 (10/16/2009), 84 (01/10/2009)  Chol:241 (04/12/2010), 138 (10/16/2009)  Trig:158.0 (04/12/2010), 97.0 (10/16/2009) add the welchol , Continue all previous medications as before this visit , Pt to continue diet efforts, good med tolerance; to check labs - goal LDL less than 70   Complete Medication List: 1)  Lisinopril-hydrochlorothiazide 20-25 Mg Tabs (Lisinopril-hydrochlorothiazide) .[name].. 1po once daily 2)  Ecotrin Low Strength 81 Mg Tbec (Aspirin) .[name].. 1 by mouth qd 3)  Azulfidine 500 Mg Tabs (Sulfasalazine) .... Take 1 tablet by mouth two times a day 4)  Simvastatin 80 Mg Tabs (Simvastatin) .[name].. 1po once daily 5)  Omeprazole 40 Mg Cpdr (Omeprazole) .... Take 1 tablet by mouth two times a day 6)  Tramadol Hcl 50 Mg Tabs (Tramadol hcl) .Marland KitchenMarland KitchenMarland Kitchen  1 - 2 by mouth four times per day as needed pain 7)  Citalopram Hydrobromide 10 Mg Tabs (Citalopram hydrobromide) .[name].. 1po once daily 8)  Promethazine Hcl 25 Mg Tabs (Promethazine hcl) .[name].. 1 by mouth as needed nausea 9)  Zolpidem Tartrate 10 Mg Tabs (Zolpidem tartrate) .[name].. 1 by mouth at bedtime as needed 10)  Welchol 3.75 Gm Pack (Colesevelam hcl) .[name].. 1 pkt by mouth once daily in water  Patient Instructions: 1)  start the welchol 1 pk per day 2)  stop the simvastatin as you have 3)  Take an Aspirin every day - 81 mg - 1 per day - COATED only 4)  Continue all previous medications as before this visit  5)  Please schedule a follow-up appointment in 6 months with CPX labs and; 6)  HbgA1C prior to visit, ICD-9: 272.0 7)  Urine Microalbumin prior to visit, ICD-9: Prescriptions: WELCHOL 3.75 GM PACK (COLESEVELAM HCL) 1 pkt by mouth once daily in water  #30 x 11   Entered and Authorized by:   Biagio Borg MD   Signed by:   Biagio Borg MD on  04/16/2010   Method used:   Print then Give to Patient   RxID:   [id number] ZOLPIDEM TARTRATE 10 MG TABS (ZOLPIDEM TARTRATE) 1 by mouth at bedtime as needed  #30 x 5   Entered and Authorized by:   Biagio Borg MD   Signed by:   Biagio Borg MD on 04/16/2010   Method used:   Print then Give to Patient   RxID:   [id number]

## 2010-10-10 NOTE — Letter (Signed)
Summary: [facility]   Imported By: Bubba Hales 01/09/2010 09:33:41  _____________________________________________________________________  External Attachment:    Type:   Image     Comment:   External Document

## 2010-10-10 NOTE — Progress Notes (Signed)
Summary: Sleep problems  Phone Note Call from Patient Call back at Home Phone [phone]   Caller: Patient Summary of Call: pt called stating that he took 2 Trazadone last night at 11pm for sleep but has yet fall asleep, he has been up all night. pt would like MD to be aware that Rx did not help. Initial call taken by: Crissie Sickles, [place],  October 18, 2009 9:43 AM  Follow-up for Phone Call        if no dry mouth or dizzy today, please try 3 tonight Follow-up by: Biagio Borg MD,  October 18, 2009 1:13 PM  Additional Follow-up for Phone Call Additional follow up Details #1::        pt informed Additional Follow-up by: Crissie Sickles, [place],  October 18, 2009 1:39 PM

## 2010-10-10 NOTE — Letter (Signed)
Summary: [facility]  [facility]   Imported By: Phillis Knack 11/15/2009 11:10:56  _____________________________________________________________________  External Attachment:    Type:   Image     Comment:   External Document

## 2010-10-10 NOTE — Letter (Signed)
Summary: Endoscopy Letter  [facility]  [place], [place] [address]   Phone: [phone]  Fax: [phone]      September 18, 2010 MRN: [id number]   [place], [place]  [address]   Dear Mr. VALENTE,   According to your medical record, it is time for you to schedule an Endoscopy. Endoscopic screening is recommended for patients with certain upper digestive tract conditions because of associated increased risk for cancers of the upper digestive system.  This letter has been generated based on the recommendations made at the time of your prior procedure. If you feel that in your particular situation this may no longer apply, please contact our office.  Please call our office at [phone]) to schedule this appointment or to update your records at your earliest convenience.  Thank you for cooperating with [place] to provide you with the very best care possible.   Sincerely,  Norberto Sorenson T. Fuller Plan, M.D.  [facility] [phone]

## 2010-10-10 NOTE — Progress Notes (Signed)
Summary: Medication Refill   Phone Note Call from Patient   Caller: Patient Call For: Dr. Fuller Plan Reason for Call: Refill Medication Summary of Call: Pt is needing a refill on his Sulfazalazine. Fax # [phone] [facility] Initial call taken by: Webb Laws,  October 30, 2009 10:31 AM  Follow-up for Phone Call        Informed pt that the rx was sent yesterday by Dr. Judi Cong office and if he needed anything else to call back. Pt agreed.  Follow-up by: Marlon Pel CMA Deborra Medina),  October 30, 2009 10:57 AM

## 2010-10-10 NOTE — Procedures (Signed)
Summary: Colonoscopy  Patient: [name] [name] Note: All result statuses are Final unless otherwise noted.  Tests: (1) Colonoscopy (COL)   COL Colonoscopy           [facility]     [place], [place]  [id number]           COLONOSCOPY PROCEDURE REPORT           PATIENT:  [name] [name], [name] [name]  MR#:  [id number]     BIRTHDATE:  10-20-1940, 68 yrs. old  GENDER:  male           ENDOSCOPIST:  Norberto Sorenson T. Fuller Plan, MD, [facility]           PROCEDURE DATE:  12/18/2009     PROCEDURE:  Colonoscopy with biopsy     ASA CLASS:  Class II     INDICATIONS:  1) evaluation of ulcerative colitis  2) surveillance     and high-risk screening           MEDICATIONS:   Benadryl 25 mg IV, Fentanyl 100 mcg IV, Versed 10     mg IV           DESCRIPTION OF PROCEDURE:   After the risks benefits and     alternatives of the procedure were thoroughly explained, informed     consent was obtained.  Digital rectal exam was performed and     revealed no abnormalities.   The LB [id number] endoscope     was introduced through the anus and advanced to the cecum, which     was identified by both the appendix and ileocecal valve, without     limitations.  The quality of the prep was good, using MoviPrep.     The instrument was then slowly withdrawn as the colon was fully     examined.     <<PROCEDUREIMAGES>>           FINDINGS:  Mild diverticulosis was found in the sigmoid colon. A     normal appearing cecum, ileocecal valve, and appendiceal orifice     were identified. The ascending, hepatic flexure, transverse,     splenic flexure, descending, appeared unremarkable. There was     nonspecific pathy erythema in the rectum and sigmoid colon. Random     biopsies were obtained throughout the colon and sent to pathology.     Retroflexed views in the rectum revealed internal hemorrhoids,     small. The time to cecum =  3  minutes. The scope was then     withdrawn (time =  8.67  min) from the  patient and the procedure     completed.           COMPLICATIONS:  None           ENDOSCOPIC IMPRESSION:     1) Mild diverticulosis in the sigmoid colon     2) Internal hemorrhoids           RECOMMENDATIONS:     1) Repeat Colonoscopy in 2 years.     2) Await pathology results     3) High fiber diet with liberal fluid intake.           [name]. Fuller Plan, MD, [facility]           CC: Biagio Borg, MD           n.     Lorrin MaisNorberto Sorenson  Sindy Guadeloupe at 12/18/2009 03:07 PM           [name], [phone]  Note: An exclamation mark (!) indicates a result that was not dispersed into the flowsheet. Document Creation Date: 12/18/2009 3:08 PM _______________________________________________________________________  (1) Order result status: Final Collection or observation date-time: 12/18/2009 15:02 Requested date-time:  Receipt date-time:  Reported date-time:  Referring Physician:   Ordering Physician: Lucio Edward [phone]) Specimen Source:  Source: Tawanna Cooler Order Number: [id number] Lab site:   Appended Document: Colonoscopy     Procedures Next Due Date:    Colonoscopy: 12/2011

## 2010-10-11 ENCOUNTER — Ambulatory Visit: Admit: 2010-10-11 | Payer: Self-pay | Admitting: Internal Medicine

## 2010-10-14 ENCOUNTER — Encounter (INDEPENDENT_AMBULATORY_CARE_PROVIDER_SITE_OTHER): Payer: Self-pay | Admitting: *Deleted

## 2010-10-14 ENCOUNTER — Other Ambulatory Visit: Payer: PRIVATE HEALTH INSURANCE

## 2010-10-14 ENCOUNTER — Other Ambulatory Visit: Payer: Self-pay | Admitting: Internal Medicine

## 2010-10-14 DIAGNOSIS — E7800 Pure hypercholesterolemia, unspecified: Secondary | ICD-10-CM

## 2010-10-14 DIAGNOSIS — E739 Lactose intolerance, unspecified: Secondary | ICD-10-CM

## 2010-10-14 DIAGNOSIS — Z79899 Other long term (current) drug therapy: Secondary | ICD-10-CM

## 2010-10-14 DIAGNOSIS — Z1289 Encounter for screening for malignant neoplasm of other sites: Secondary | ICD-10-CM

## 2010-10-14 DIAGNOSIS — Z0000 Encounter for general adult medical examination without abnormal findings: Secondary | ICD-10-CM

## 2010-10-14 LAB — CBC WITH DIFFERENTIAL/PLATELET
Basophils Relative: 0.8 % (ref 0.0–3.0)
Eosinophils Relative: 3.7 % (ref 0.0–5.0)
Lymphocytes Relative: 13.5 % (ref 12.0–46.0)
MCV: 91.5 fl (ref 78.0–100.0)
Monocytes Relative: 9.1 % (ref 3.0–12.0)
Neutrophils Relative %: 72.9 % (ref 43.0–77.0)
RBC: 5.37 Mil/uL (ref 4.22–5.81)
WBC: 5.9 10*3/uL (ref 4.5–10.5)

## 2010-10-14 LAB — MICROALBUMIN / CREATININE URINE RATIO: Microalb Creat Ratio: 0.6 mg/g (ref 0.0–30.0)

## 2010-10-14 LAB — BASIC METABOLIC PANEL
BUN: 11 mg/dL (ref 6–23)
Chloride: 97 mEq/L (ref 96–112)
Glucose, Bld: 106 mg/dL — ABNORMAL HIGH (ref 70–99)
Potassium: 4.3 mEq/L (ref 3.5–5.1)
Sodium: 136 mEq/L (ref 135–145)

## 2010-10-14 LAB — LIPID PANEL
Cholesterol: 193 mg/dL (ref 0–200)
LDL Cholesterol: 136 mg/dL — ABNORMAL HIGH (ref 0–99)

## 2010-10-14 LAB — HEPATIC FUNCTION PANEL
ALT: 21 U/L (ref 0–53)
AST: 18 U/L (ref 0–37)
Albumin: 3.7 g/dL (ref 3.5–5.2)
Alkaline Phosphatase: 52 U/L (ref 39–117)

## 2010-10-14 LAB — URINALYSIS
Hgb urine dipstick: NEGATIVE
Nitrite: NEGATIVE
Specific Gravity, Urine: 1.015 (ref 1.000–1.030)
Total Protein, Urine: NEGATIVE
Urine Glucose: NEGATIVE
Urobilinogen, UA: 1 (ref 0.0–1.0)

## 2010-10-14 LAB — HEMOGLOBIN A1C: Hgb A1c MFr Bld: 5.7 % (ref 4.6–6.5)

## 2010-10-14 LAB — PSA: PSA: 0.62 ng/mL (ref 0.10–4.00)

## 2010-10-18 ENCOUNTER — Encounter: Payer: Self-pay | Admitting: Internal Medicine

## 2010-10-18 ENCOUNTER — Encounter (INDEPENDENT_AMBULATORY_CARE_PROVIDER_SITE_OTHER): Payer: MEDICARE | Admitting: Internal Medicine

## 2010-10-18 DIAGNOSIS — Z0000 Encounter for general adult medical examination without abnormal findings: Secondary | ICD-10-CM

## 2010-10-25 ENCOUNTER — Other Ambulatory Visit: Payer: Self-pay | Admitting: Gastroenterology

## 2010-10-25 ENCOUNTER — Encounter: Payer: Self-pay | Admitting: Gastroenterology

## 2010-10-25 ENCOUNTER — Ambulatory Visit (INDEPENDENT_AMBULATORY_CARE_PROVIDER_SITE_OTHER): Payer: Medicare Other | Admitting: Gastroenterology

## 2010-10-25 DIAGNOSIS — N509 Disorder of male genital organs, unspecified: Secondary | ICD-10-CM

## 2010-10-25 DIAGNOSIS — N50811 Right testicular pain: Secondary | ICD-10-CM

## 2010-10-25 DIAGNOSIS — N50819 Testicular pain, unspecified: Secondary | ICD-10-CM

## 2010-10-25 DIAGNOSIS — K2270 Barrett's esophagus without dysplasia: Secondary | ICD-10-CM

## 2010-10-25 HISTORY — DX: Disorder of male genital organs, unspecified: N50.9

## 2010-10-28 ENCOUNTER — Ambulatory Visit (INDEPENDENT_AMBULATORY_CARE_PROVIDER_SITE_OTHER)
Admission: RE | Admit: 2010-10-28 | Discharge: 2010-10-28 | Disposition: A | Discharge status: Home / Self Care | Payer: Medicare Other | Source: Ambulatory Visit | Attending: Gastroenterology | Admitting: Gastroenterology

## 2010-10-28 DIAGNOSIS — R1031 Right lower quadrant pain: Secondary | ICD-10-CM

## 2010-10-28 DIAGNOSIS — N508 Other specified disorders of male genital organs: Secondary | ICD-10-CM

## 2010-10-28 DIAGNOSIS — N50811 Right testicular pain: Secondary | ICD-10-CM

## 2010-10-30 ENCOUNTER — Encounter: Payer: Self-pay | Admitting: Gastroenterology

## 2010-10-30 ENCOUNTER — Encounter: Payer: Self-pay | Admitting: Internal Medicine

## 2010-10-30 NOTE — Assessment & Plan Note (Addendum)
Summary: Swelling in his groin/ sched w/ pt/em   History of Present Illness Primary GI MD: Joylene Igo MD [facility] Primary Provider: [name], MD Requesting Provider: n/a Chief Complaint: Right inguinal and testicular pain History of Present Illness:    [name] [name] complains of pain in his right groin and right testicle that has been present for many months. He's been evaluated by Dr. Harlow Asa and Dr. Jeffie Pollock without a clear etiology uncovered. It does not relate to any digestive function is typically bothered by movement bending and local pressure.  His ulcerative colitis is under excellent control.  His reflux symptoms are also under excellent control and he is due for Barrett's surveillance   GI Review of Systems     Location of  Abdominal pain: groin area.    Denies abdominal pain, acid reflux, belching, bloating, chest pain, dysphagia with liquids, dysphagia with solids, heartburn, loss of appetite, nausea, vomiting, vomiting blood, weight loss, and  weight gain.        Denies anal fissure, black tarry stools, change in bowel habit, constipation, diarrhea, diverticulosis, fecal incontinence, heme positive stool, hemorrhoids, irritable bowel syndrome, jaundice, light color stool, liver problems, rectal bleeding, and  rectal pain.   Current Medications (verified): 1)  Lisinopril-Hydrochlorothiazide 20-25 Mg Tabs (Lisinopril-Hydrochlorothiazide) .[name].. 1po Once Daily 2)  Azulfidine 500 Mg Tabs (Sulfasalazine) .... Take 1 Tablet By Mouth Two Times A Day 3)  Omeprazole 40 Mg Cpdr (Omeprazole) .... Take 1 Tablet By Mouth Two Times A Day 4)  Zolpidem Tartrate 10 Mg Tabs (Zolpidem Tartrate) .[name].. 1 By Mouth At Bedtime As Needed  Allergies (verified): 1)  ! * Ivp Dye 2)  ! * Simvastatin 3)  * Trazodone 4)  Asa  Past History:  Past Medical History: Reviewed history from 11/13/2009 and no changes required. Diverticulosis, colon GERD Nephrolithiasis, hx  of Hyperlipidemia Hypertension Ulcerative proctosigmoiditis 1997 Low back pain Barrett's esophagus 1997 Adenomatous Colon Polyps 01/2002 glucose intolerance/borderline DM H. pylori gastritis 1997 Depression vit d  deficiency  Past Surgical History: Reviewed history from 03/16/2009 and no changes required. Left knee arthroscopy Bilateral inguinal hernia with mesh and umblical hernia repairs 09/2007 Anal fissure surgery  Family History: Reviewed history from 11/14/2009 and no changes required. parents with COPD No FH of Colon Cancer  Social History: Reviewed history from 11/14/2009 and no changes required. Former Smoker Alcohol use-yes  1 beer occ Married,  3 children retired - truck Psychiatric nurse Caffeine Use one cup coffee day  Vital Signs:  Patient profile:   70 year old male Height:      68 inches Weight:      240 pounds BMI:     36.62 Pulse rate:   88 / minute Pulse rhythm:   regular BP sitting:   124 / 78  (right arm) Cuff size:   regular  Vitals Entered By: Marlon Pel CMA [name] [name]) (October 25, 2010 2:54 PM)  Physical Exam  General:  Well developed, well nourished, no acute distress. Head:  Normocephalic and atraumatic. Mouth:  No deformity or lesions, dentition normal. Lungs:  Clear throughout to auscultation. Heart:  Regular rate and rhythm; no murmurs, rubs,  or bruits. Abdomen:  Soft, nontender and nondistended. No masses, hepatosplenomegaly or hernias noted. Normal bowel sounds.  Right inguinal area tenderness without any lesions noted. Extremities:  No clubbing, cyanosis, edema or deformities noted. Neurologic:  Alert and  oriented x4;  grossly normal neurologically. Psych:  Alert and cooperative. Normal mood and affect.  Impression &  Recommendations:  Problem # 1:  TESTICULAR PAIN, RIGHT (ICD-608.9)  Right testicular pain and right inguinal pain. Schedule abdominal and pelvic CT scan. Followup visit with his urologist [facility]  Problem # 2:  ULCERATIVE COLITIS-LEFT SIDE (ICD-556.5)  Continue Azulfidine 1 g twice a day.  Surveillance colonoscopy April 2013.  Problem # 3:  BARRETTS ESOPHAGUS (ICD-530.85)  Barrett surveillance is due. The risks, benefits and alternatives to endoscopy with possible biopsy and possible dilation were discussed with the patient and they consent to proceed. The procedure will be scheduled electively.  Continue omeprazole 40 mg twice daily and standard entire reflux measures.  Other Orders: GI Misc Procedure/ Radiology Order (GI Misc )  Patient Instructions: 1)  You have been scheduled for a CT scan of the pelvis on 10/28/10.  2)  We have made you a appointment to see Dr. Jeffie Pollock at [facility] on 10/30/10 at 3:45 but please arrive at 3:30pm. Please bring your insurance cards and your medications to the appointment.  3)  Copy sent to : [name], MD 4)                         Irine Seal, MD 5)  The medication list was reviewed and reconciled.  All changed / newly prescribed medications were explained.  A complete medication list was provided to the patient / caregiver.  Appended Document: Swelling in his groin/ sched w/ pt/em    Clinical Lists Changes  Orders: Added new Test order of EGD (EGD) - Signed

## 2010-10-30 NOTE — Letter (Signed)
Summary: EGD Instructions  [facility]  [place], [place] [address]   Phone: [phone]  Fax: [phone]       MANNIX KROEKER    1940-11-05    MRN: [id number]       Procedure Day /Date: Tuesday February 28th, 2012     Arrival Time: 1:30pm       Procedure Time: 2:30pm     Location of Procedure:                    _x  _ [place] (4th Floor)    PREPARATION FOR ENDOSCOPY   On 11/05/10 THE DAY OF THE PROCEDURE:  1.   No solid foods, milk or milk products are allowed after midnight the night before your procedure.  2.   Do not drink anything colored red or purple.  Avoid juices with pulp.  No orange juice.  3.  You may drink clear liquids until 12:30pm, which is 2 hours before your procedure.                                                                                                CLEAR LIQUIDS INCLUDE: Water Jello Ice Popsicles Tea (sugar ok, no milk/cream) Powdered fruit flavored drinks Coffee (sugar ok, no milk/cream) Gatorade Juice: apple, white grape, white cranberry  Lemonade Clear bullion, consomm, broth Carbonated beverages (any kind) Strained chicken noodle soup Hard Candy   MEDICATION INSTRUCTIONS  Unless otherwise instructed, you should take regular prescription medications with a small sip of water as early as possible the morning of your procedure.       OTHER INSTRUCTIONS  You will need a responsible adult at least 70 years of age to accompany you and drive you home.   This person must remain in the waiting room during your procedure.  Wear loose fitting clothing that is easily removed.  Leave jewelry and other valuables at home.  However, you may wish to bring a book to read or an iPod/MP3 player to listen to music as you wait for your procedure to start.  Remove all body piercing jewelry and leave at home.  Total time from sign-in until discharge is approximately 2-3 hours.  You should go home  directly after your procedure and rest.  You can resume normal activities the day after your procedure.  The day of your procedure you should not:   Drive   Make legal decisions   Operate machinery   Drink alcohol   Return to work  You will receive specific instructions about eating, activities and medications before you leave.    The above instructions have been reviewed and explained to me by   Estill Bamberg.    I fully understand and can verbalize these instructions _____________________________ Date _________

## 2010-11-05 ENCOUNTER — Other Ambulatory Visit: Payer: Self-pay | Admitting: Gastroenterology

## 2010-11-05 ENCOUNTER — Other Ambulatory Visit (AMBULATORY_SURGERY_CENTER): Payer: Medicare Other | Admitting: Gastroenterology

## 2010-11-05 DIAGNOSIS — K2940 Chronic atrophic gastritis without bleeding: Secondary | ICD-10-CM

## 2010-11-05 DIAGNOSIS — K2990 Gastroduodenitis, unspecified, without bleeding: Secondary | ICD-10-CM

## 2010-11-05 DIAGNOSIS — K219 Gastro-esophageal reflux disease without esophagitis: Secondary | ICD-10-CM

## 2010-11-05 DIAGNOSIS — K2270 Barrett's esophagus without dysplasia: Secondary | ICD-10-CM

## 2010-11-05 NOTE — Assessment & Plan Note (Signed)
Summary: CPX/SECURE HORIZONS/[employer] #   Vital Signs:  Patient profile:   70 year old male Height:      68 inches Weight:      238.38 pounds BMI:     36.38 O2 Sat:      97 % on Room air Temp:     98.5 degrees F oral Pulse rate:   69 / minute BP sitting:   120 / 70  (left arm) Cuff size:   large  Vitals Entered By: Shirlean Mylar Ewing CMA Deborra Medina) (October 18, 2010 10:40 AM)  O2 Flow:  Room air  CC: CPX/RE   Primary Care Provider:  [name], MD  CC:  CPX/RE.  History of Present Illness: here for wellness and f/u; overall doing ok;  Pt denies CP, worsening sob, doe, wheezing, orthopnea, pnd, worsening LE edema, palps, dizziness or syncope  Pt denies new neuro symptoms such as headache, facial or extremity weakness  Pt denies polydipsia, polyuria, Overall good compliance with meds, trying to follow low chol  diet, wt stable, little excercise however .  No fever, wt loss, night sweats, loss of appetite or other constitutional symptoms  Overall good compliance with meds, and good tolerability.  Denies worsening depressive symptoms, suicidal ideation, or panic.  Pt states good ability with ADL's, low fall risk, home safety reviewed and adequate, no significant change in hearing or vision, trying to follow lower chol diet, and occasionally active only with regular excercise.   Preventive Screening-Counseling & Management      Drug Use:  no.    Problems Prior to Update: 1)  Testicular Pain, Right  (ICD-608.9) 2)  Preventive Health Care  (ICD-V70.0) 3)  Universal Ulcerative Colitis  (ICD-556.6) 4)  Headache  (ICD-784.0) 5)  Vitamin D Deficiency  (ICD-268.9) 6)  Abdominal Pain, Unspecified Site  (ICD-789.00) 7)  Depression  (ICD-311) 8)  Hip Pain, Right  (ICD-719.45) 9)  Shoulder Pain, Left  (ICD-719.41) 10)  Sinusitis- Acute-nos  (ICD-461.9) 11)  Breast Hypertrophy  (ICD-611.1) 12)  Leg Pain, Bilateral  (ICD-729.5) 13)  Hepatotoxicity, Drug-induced, Risk of  (ICD-V58.69) 14)  Back Pain   (ICD-724.5) 15)  Mastitis  (ICD-611.0) 16)  Insomnia-sleep Disorder-unspec  (ICD-780.52) 17)  Chest Pain  (ICD-786.50) 18)  Colonic Polyps, Hx of  (ICD-V12.72) 19)  Glucose Intolerance  (ICD-271.3) 20)  Abdominal Pain, Generalized  (ICD-789.07) 21)  Preventive Health Care  (ICD-V70.0) 22)  Neck Pain  (ICD-723.1) 23)  Ulcerative Colitis-left Side  (ICD-556.5) 24)  Anal Fissure  (ICD-565.0) 25)  Carbuncle, Neck  (ICD-680.1) 26)  Preventive Health Care  (ICD-V70.0) 27)  Barretts Esophagus  (ICD-530.85) 28)  Colonic Polyps, Adenomatous, Hx of  (ICD-V12.72) 29)  Hernia, Umbilical W/obstruction w/o Gangrene  (ICD-552.1) 30)  Low Back Pain  (ICD-724.2) 31)  Proctosigmoiditis, Ulcerative  (ICD-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 1po Once Daily 2)  Ecotrin Low Strength 81 Mg  Tbec (Aspirin) .[name].. 1 By Mouth Qd 3)  Azulfidine 500 Mg Tabs (Sulfasalazine) .... Take 1 Tablet By Mouth Two Times A Day 4)  Simvastatin 80 Mg Tabs (Simvastatin) .[name].. 1po Once Daily 5)  Omeprazole 40 Mg Cpdr (Omeprazole) .... Take 1 Tablet By Mouth Two Times A Day 6)  Tramadol Hcl 50 Mg Tabs (Tramadol Hcl) .[name].. 1 - 2 By Mouth Four  Times Per Day As Needed Pain 7)  Citalopram Hydrobromide 10 Mg Tabs (Citalopram Hydrobromide) .[name].. 1po Once Daily 8)  Promethazine Hcl 25 Mg Tabs (Promethazine Hcl) .[name].. 1 By Mouth As Needed Nausea 9)  Zolpidem Tartrate 10 Mg Tabs (Zolpidem Tartrate) .[name].. 1 By Mouth At Bedtime As Needed 10)  Welchol 3.75 Gm Pack (Colesevelam Hcl) .[name].. 1 Pkt By Mouth Once Daily in Water  Current Medications (verified): 1)  Lisinopril-Hydrochlorothiazide 20-25 Mg Tabs (Lisinopril-Hydrochlorothiazide) .[name].. 1po Once Daily 2)  Azulfidine 500 Mg Tabs  (Sulfasalazine) .... Take 1 Tablet By Mouth Two Times A Day 3)  Omeprazole 40 Mg Cpdr (Omeprazole) .... Take 1 Tablet By Mouth Two Times A Day 4)  Zolpidem Tartrate 10 Mg Tabs (Zolpidem Tartrate) .[name].. 1 By Mouth At Bedtime As Needed  Allergies (verified): 1)  ! * Ivp Dye 2)  ! * Simvastatin 3)  * Trazodone 4)  Asa  Past History:  Past Medical History: Last updated: 11/13/2009 Diverticulosis, colon GERD Nephrolithiasis, hx of Hyperlipidemia Hypertension Ulcerative proctosigmoiditis 1997 Low back pain Barrett's esophagus 1997 Adenomatous Colon Polyps 01/2002 glucose intolerance/borderline DM H. pylori gastritis 1997 Depression vit d  deficiency  Past Surgical History: Last updated: 03/16/2009 Left knee arthroscopy Bilateral inguinal hernia with mesh and umblical hernia repairs 09/2007 Anal fissure surgery  Family History: Last updated: 11/14/2009 parents with COPD No FH of Colon Cancer  Social History: Last updated: 10/18/2010 Former Smoker Alcohol use-yes  1 beer occ Married,  3 children retired - truck Geophysicist/field seismologist Daily Caffeine Use one cup coffee day Drug use-no  Risk Factors: Smoking Status: quit (06/28/2007)  Social History: Former Smoker Alcohol use-yes  1 beer occ Married,  3 children retired - truck Psychiatric nurse Caffeine Use one cup coffee day Drug use-no Drug Use:  no  Review of Systems  The patient denies anorexia, fever, vision loss, decreased hearing, hoarseness, chest pain, syncope, dyspnea on exertion, peripheral edema, prolonged cough, headaches, hemoptysis, abdominal pain, melena, hematochezia, severe indigestion/heartburn, hematuria, muscle weakness, suspicious skin lesions, transient blindness, difficulty walking, depression, unusual weight change, abnormal bleeding, enlarged lymph nodes, and angioedema.         all otherwise negative per pt -    Physical Exam  General:  alert and overweight-appearing.   Head:  normocephalic and  atraumatic.   Eyes:  vision grossly intact, pupils equal, and pupils round.   Ears:  R ear normal and L ear normal.   Nose:  no external deformity and no nasal discharge.   Mouth:  no gingival abnormalities and pharynx pink and moist.   Neck:  supple and no masses.   Lungs:  normal respiratory effort and normal breath sounds.   Heart:  normal rate and regular rhythm.   Abdomen:  soft, non-tender, and normal bowel sounds.   Msk:  no joint tenderness and no joint swelling.   Extremities:  no edema, no erythema  Neurologic:  alert & oriented X3, cranial nerves II-XII intact, and strength normal in all extremities.   Skin:  color normal and no rashes.   Psych:  not depressed appearing and moderately anxious.     Impression & Recommendations:  Problem # 1:  Preventive Health Care (ICD-V70.0) Overall doing well, age appropriate education and counseling updated, referral for preventive services and immunizations addressed, dietary counseling and smoking status adressed , most recent labs reviewed, ecg reviewed I have personally reviewed and have noted 1.The patient's medical and social history 2.Their use of alcohol, tobacco or illicit drugs 3.Their  current medications and supplements 4. Functional ability including ADL's, fall risk, home safety risk, hearing & visual impairment  5.Diet and physical activities 6.Evidence for depression or mood disorders The patients weight, height, BMI  have been recorded in the chart I have made referrals, counseling and provided education to the patient based review of the above  Orders: EKG w/ Interpretation (93000)  Problem # 2:  HYPERLIPIDEMIA (ICD-272.4)  The following medications were removed from the medication list:    Simvastatin 80 Mg Tabs (Simvastatin) .[name]... 1po once daily    Welchol 3.75 Gm Pack (Colesevelam hcl) .[name]... 1 pkt by mouth once daily in water unable to tolerate the welchol, and no longer wants to take the statin;  consider  lipitor next visit per pt preference - Pt to continue diet efforts  Labs Reviewed: SGOT: 18 (10/14/2010)   SGPT: 21 (10/14/2010)   HDL:28.00 (10/14/2010), 30.10 (04/12/2010)  LDL:136 (10/14/2010), 86 (10/16/2009)  Chol:193 (10/14/2010), 241 (04/12/2010)  Trig:144.0 (10/14/2010), 158.0 (04/12/2010)  Problem # 3:  HYPERTENSION (ICD-401.9)  His updated medication list for this problem includes:    Lisinopril-hydrochlorothiazide 20-25 Mg Tabs (Lisinopril-hydrochlorothiazide) .[name]... 1po once daily  BP today: 120/70 Prior BP: 118/60 (04/16/2010)  Labs Reviewed: K+: 4.3 (10/14/2010) Creat: : 1.0 (10/14/2010)   Chol: 193 (10/14/2010)   HDL: 28.00 (10/14/2010)   LDL: 136 (10/14/2010)   TG: 144.0 (10/14/2010) stable overall by hx and exam, ok to continue meds/tx as is   Complete Medication List: 1)  Lisinopril-hydrochlorothiazide 20-25 Mg Tabs (Lisinopril-hydrochlorothiazide) .[name].. 1po once daily 2)  Azulfidine 500 Mg Tabs (Sulfasalazine) .... Take 1 tablet by mouth two times a day 3)  Omeprazole 40 Mg Cpdr (Omeprazole) .... Take 1 tablet by mouth two times a day 4)  Zolpidem Tartrate 10 Mg Tabs (Zolpidem tartrate) .[name].. 1 by mouth at bedtime as needed  Patient Instructions: 1)  Continue all previous medications as before this visit  2)  Please schedule a follow-up appointment in 6 months with: 3)  BMP prior to visit, ICD-9: 401.1 4)  Hepatic Panel prior to visit, ICD-9: v58.69 5)  Lipid Panel prior to visit, ICD-9: 272.0 6)  HbgA1C prior to visit, ICD-9: 790.2 Prescriptions: OMEPRAZOLE 40 MG CPDR (OMEPRAZOLE) Take 1 tablet by mouth two times a day  #180 x 3   Entered and Authorized by:   Biagio Borg MD   Signed by:   Biagio Borg MD on 10/18/2010   Method used:   Print then Give to Patient   RxID:   [id number]    Orders Added: 1)  EKG w/ Interpretation [93000] 2)  Est. Patient 65& > [58251]

## 2010-11-11 ENCOUNTER — Telehealth: Payer: Self-pay | Admitting: Gastroenterology

## 2010-11-12 ENCOUNTER — Encounter: Payer: Self-pay | Admitting: Gastroenterology

## 2010-11-14 NOTE — Letter (Signed)
Summary: [facility]   Imported By: Phillis Knack 11/05/2010 12:09:35  _____________________________________________________________________  External Attachment:    Type:   Image     Comment:   External Document

## 2010-11-14 NOTE — Procedures (Addendum)
Summary: Upper Endoscopy  Patient: [name] Note: All result statuses are Final unless otherwise noted.  Tests: (1) Upper Endoscopy (EGD)   EGD Upper Endoscopy       [place] [facility] [employer] [facility]     [place], [place]  [address]          ENDOSCOPY PROCEDURE REPORT          PATIENT:  [name] [name], [name] [name]  MR#:  [id number]     BIRTHDATE:  10-Jan-1941, 65 yrs. old  GENDER:  male     ENDOSCOPIST:  Norberto Sorenson T. Fuller Plan, MD, [facility]          PROCEDURE DATE:  11/05/2010     PROCEDURE:  EGD with biopsy, 16606     ASA CLASS:  Class II     INDICATIONS:  follow-up of Barrett's Esophagus     MEDICATIONS:  Fentanyl 100 mcg IV, Versed 10 mg IV     TOPICAL ANESTHETIC:  Exactacain Spray     DESCRIPTION OF PROCEDURE:   After the risks benefits and     alternatives of the procedure were thoroughly explained, informed     consent was obtained.  The LB GIF-H180 [id number] endoscope was     introduced through the mouth and advanced to the second portion of     the duodenum, without limitations.  The instrument was slowly     withdrawn as the mucosa was fully examined.     <<PROCEDUREIMAGES>>     Barrett's esophagus was found in the distal esophagus. It was 1 cm     in length. Multiple biopsies were obtained and sent to pathology.     Otherwise normal esophagus. Mild gastritis was found throughout     the stomach. It was granular and erythematous. Biopsies of the     antrum and body of the stomach were obtained and sent to     pathology. Otherwise normal stomach. The duodenal bulb was normal     in appearance, as was the postbulbar duodenum. Retroflexed views     revealed no abnormalities. The scope was then withdrawn from the     patient and the procedure completed.          COMPLICATIONS:  None          ENDOSCOPIC IMPRESSION:     1) 1 cm barrett's esophagus     2) Mild gastritis          RECOMMENDATIONS:     1) Anti-reflux regimen     2) Await pathology results     3) Endoscopy  in 3 years if no dysplasia          [name] T. Fuller Plan, MD, [facility]          CC:  Biagio Borg, MD          n.     Lorrin MaisPricilla Riffle. Nayelie Gionfriddo at 11/05/2010 03:19 PM          [name], [phone]  Note: An exclamation mark (!) indicates a result that was not dispersed into the flowsheet. Document Creation Date: 11/05/2010 3:19 PM _______________________________________________________________________  (1) Order result status: Final Collection or observation date-time: 11/05/2010 15:06 Requested date-time:  Receipt date-time:  Reported date-time:  Referring Physician:   Ordering Physician: Lucio Edward [phone]) Specimen Source:  Source: Tawanna Cooler Order Number: [id number] Lab site:   Appended Document: Upper Endoscopy     Procedures Next Due Date:  EGD: 10/2013

## 2010-11-14 NOTE — Letter (Signed)
Summary: Irine Seal MD/[facility]  Irine Seal MD/[facility]   Imported By: Bubba Hales 11/04/2010 08:48:08  _____________________________________________________________________  External Attachment:    Type:   Image     Comment:   External Document

## 2010-11-19 NOTE — Progress Notes (Signed)
Summary: Refill  Medications Added AZULFIDINE 500 MG TABS (SULFASALAZINE) Take 1 tablet by mouth two times a day       Phone Note Call from Patient Call back at [facility] Phone [phone]   Call For: Dr Fuller Plan Reason for Call: Refill Medication Summary of Call: Sulfaxiane 576m needs refill. He uses mail order but since he is out [place] [facility] [facility] [place] [facility] taken by: [facility]win BrakemanP[facility]  November 11, 2010 8:17 AM  Follow-up for Phone Call        Rx was sent to pts pharmacy. Pt notified. Follow-up by: AMarlon PelCMA (ADennehot[place],  November 11, 2010 8:45 AM    New/Updated Medications: AZULFIDINE 500 MG TABS (SULFASALAZINE) Take 1 tablet by mouth two times a day Prescriptions: AZULFIDINE 500 MG TABS (SULFASALAZINE) Take 1 tablet by mouth two times a day  #60 x 1   Entered by:   AMarlon PelCMA (ASpaulding   Authorized by:   MLadene ArtistMD F[facility]  Signed by:   AMarlon PelCMA (AReading on 11/11/2010   Method used:   Electronically to        [facility][phone] (retail)       [address] [id number]      Ph: [phone]      Fax: [phone]  RxID:   [id number]  Appended Document: Refill    Prescriptions: AZULFIDINE 500 MG TABS (SULFASALAZINE) Take 1 tablet by mouth two times a day  #180 x 1   Entered by:   AMarlon PelCMA (ADowns[place]   Authorized by:   MLadene ArtistMD FAl[facility]  Signed by:   AMarlon PelCMA (AOzark on 11/12/2010   Method used:   Electronically to        [facility][phone] (retail)       [address] [id number]      Ph: [phone]      Fax: [phone]  RxID:   [id number]

## 2010-11-19 NOTE — Letter (Signed)
Summary: Patient Notice-Endo Biopsy Results  [facility]  [place], [place] [address]   Phone: [phone]  Fax: [phone]        November 12, 2010 MRN: [id number]    [address], [place]  [address]    Dear [name] [name],  I am pleased to inform you that the biopsies taken during your recent endoscopic examination did not show any evidence of cancer upon pathologic examination. The biopsies showed gastritis and changes of GERD.  Continue with the treatment plan as outlined on the day of your      exam.  You should have a repeat endoscopic examination in 3 years.  Please call us if you are having persistent problems or have questions about your condition that have not been fully answered at this time.  Sincerely,  Ladene Artist MD [facility]  This letter has been electronically signed by your physician.  Appended Document: Patient Notice-Endo Biopsy Results letter mailed

## 2010-12-09 ENCOUNTER — Other Ambulatory Visit: Payer: Self-pay

## 2010-12-09 MED ORDER — LISINOPRIL-HYDROCHLOROTHIAZIDE 20-25 MG PO TABS: 1.0000 | ORAL_TABLET | Freq: Every day | ORAL | Status: DC

## 2011-01-20 ENCOUNTER — Other Ambulatory Visit: Payer: Self-pay | Admitting: Gastroenterology

## 2011-01-20 MED ORDER — SULFASALAZINE 500 MG PO TABS: 500.0000 mg | ORAL_TABLET | Freq: Two times a day (BID) | ORAL | Status: DC

## 2011-01-20 NOTE — Telephone Encounter (Signed)
Spoke with patient and told him I sent the refill to his pharmacy.

## 2011-01-21 NOTE — Op Note (Signed)
NAMEROBBEN, JAGIELLO NO.:  [id number]   MEDICAL RECORD NO.:  [id number]          PATIENT TYPE:  AMB   LOCATION:  DAY                          FACILITY:  [facility]   PHYSICIAN:  Adin Hector, MD     DATE OF BIRTH:  12/28/1940   DATE OF PROCEDURE:  09/30/2007  DATE OF DISCHARGE:                               OPERATIVE REPORT   PRIMARY CARE PHYSICIAN:  Biagio Borg, M.D.   GASTROENTEROLOGIST:  [name]. Fuller Plan, M.D.   SURGEON:  Michael Boston, M.D.   ASSISTANT:  None.   PREOPERATIVE DIAGNOSIS:  Bilateral inguinal hernias and umbilical  hernia.   POSTOPERATIVE DIAGNOSIS:  Bilateral inguinal hernias and umbilical  hernia.   PROCEDURE PERFORMED:  1. Laparoscopic bilateral inguinal hernia repair using 15 x 15-cm      Ultrapro mesh for each side.  2. Umbilical hernia repair with Ventralex and dual-sided mesh 8-cm      underlay repair with polypropylene over the repair.   ANESTHESIA:  1. General anesthesia.  2. Bilateral ilioinguinal and genitofemoral and cord nerve blocks.  3. local anesthetic field block around all port sites.   SPECIMENS:  None.   DRAINS:  None.   ESTIMATED BLOOD LOSS:  30 mL.   COMPLICATIONS:  No major complications.   INDICATIONS:  Mr. Corron is a 70 year old gentleman who has had issues  of abdominal pain and has a known symptomatic right inguinal hernia and  umbilical hernia. He was also found to have a left inguinal hernia as  well. Anatomy and physiology of abdominal wall malformation and  testicular migration was explained. Pathophysiology of inguinal  herniations with the risk of pain, strangulation, and incarceration was  discussed. Options discussed and recommendations made for laparoscopic  preperitoneal exploration with hernia repair and umbilical hernia  repair.   Risks such as stroke, heart attack, deep vein thrombosis, pulmonary  embolism and death were discussed. Risks such as bleeding, need for  transfusion, wound  infection, abscess, injury to other organs and  prolonged pain were discussed. The risks of urinary retention needing  Foley catheterization, significant bruising, testicular injury or loss,  hernia recurrence, prolonged pain and other risks were discussed.  Questions were answered, and he agreed to proceed.   OPERATIVE FINDINGS:  He had bilateral indirect inguinal hernias, right  with significant cord lipomas greater than left, although left seemed to  be more scarred in. He had 2 x 2-cm umbilical hernia defect with some  omentum within it but reducible.   DESCRIPTION OF PROCEDURE:  Informed consent was confirmed. The patient  received IV antibiotics just prior to surgery. He had sequential  compression devices active during the entire case. He underwent general  anesthesia without any difficulty. He was positioned supine with both  arms tucked. He had a Foley catheter sterilely placed. His abdomen was  clipped, prepped and draped in sterile fashion.   Entry was gained in the abdomen through the abdominal wall and through  an infraumbilical curvilinear incision. A nick was made in the anterior  rectus fascia just left of the midline, and  a 12-mm Hasson port was  placed in the preperitoneal plane. Capnoperitoneum to 15 mmHg provided  good abdominal wall insufflation. A 10-mm/30-degree scope was used to  help free the peritoneum off of the anterior abdominal wall. Enough  working space was created such that 5-mm ports could be placed in the  left mid abdomen and right mid abdomen.   Attention was turned towards the right side as that was the more  symptomatic side. Peritoneum was freed of laterally and followed down to  the pelvic brim. Window was made between the anterolateral bladder and  anterolateral pelvis wall, down to the level of the obturator foramen  with good result. Peritoneum could be seen crawling up with the cord  structures, and this was freed off the cord structures.  He had very  large cord lipomas that over gradual time and meticulous dissection was  able to reduce all the cord lipomas out until we got a nice healthy  cord. Peritoneum was peeled back off of the vas deferens and other cord  structures as proximally as possible. Peritoneum was freed off laterally  to good result.   Attention was turned to the left side. Dissection was carried out in a  similar mirror-image fashion. In dissecting, he had some dense  preperitoneal adhesions with not as well-defined plane. Actually, his  inferior epigastric came down with the peritoneum initially. With some  sharp and careful blunt dissection, I was able to free it up. I helped  tack it up to the anterior abdominal wall using a 2-0 Prolene stitch and  a Keith needle in a figure-of-eight fashion to help sling the inferior  epigastric blood vessels out of the way. Peritoneum was very scarred  around the internal ring, and using careful sharp and blunt dissection,  I was able to free the peritoneum off of the ring. The cord structures  themselves were soft and uninvolved. I was able to free the peritoneum  off the cord structures safely and peel it back as proximally as  possible.   A 15 x 15-cm ultra light weight polypropylene (Ultrapro) mesh was used  for each side. Each mesh was cut in a half-skull shape, and a medial  inferior flap was placed in the true pelvis between the anterior medial  bladder and anterolateral pelvis. The mesh laid well proximally,  laterally, superiorly and medially such that there was at least 3 inch  of circumferential coverage around both internal rings. Any potential  direct femoral and obturator defects were covered as well. Lead points  of hernia sacs on both sides were grasped and elevated cephalad, and  capnopreperitoneum was evaluated. Ports were removed. Infraumbilical and  fascial defect was closed using 0 Vicryl figure-of-eight stitch.   Next, attention was turned  to an umbilical hernia repair. The hernia was  coming through the umbilical stalk and after careful dissection was able  to free the stalk off of the hernia stalk. In doing this, it did breech  into the sac. Finger sweep revealed no significant anterior abdominal  adhesions to the region. There had been omentum within the sac. I went  ahead and reduced and placed the sac back in there. I chose an 8-cm  circular Ventralex dual-sided polypropylene/Gore-Tex mesh, and I placed  it in rough side up. It was secured to the anterior abdominal wall using  #1 Ethibond in interrupted stitches x8 in a circumferential pattern.  Fascial defect was closed transversely using #1 Ethibond interrupted  stitches x3,  taking a bite also of polypropylene mesh on top of it to  help tack the stitches down such that there was mesh below and above the  primary closure. Umbilical stalk was tacked down using #1 Ethibond  stitch to the mesh and fascia as well. Skin was closed using 4-0  Monocryl stitch. Sterile dressing was applied. The patient was extubated  and taken to the recovery room in stable condition.   I explained the operative findings to the patient's wife. Questions were  answered, and instructions were given to the patient prior to surgery  and again with his wife just after surgery. Questions were answered, and  they expressed understanding and appreciation.      Adin Hector, MD  Electronically Signed     SCG/MEDQ  D:  09/30/2007  T:  09/30/2007  Job:  [id number]   cc:   Biagio Borg, MD  [address] [address]   Malcolm T. Fuller Plan, MD, FACG  [address]  [place] [address]

## 2011-01-21 NOTE — Op Note (Signed)
NAMEQUINTAVIUS, [name] [name]                ACCOUNT NO.:  [id number]   MEDICAL RECORD NO.:  [id number]          PATIENT TYPE:  AMB   LOCATION:  DAY                          FACILITY:  [facility]   PHYSICIAN:  Jonne Ply, MD   DATE OF BIRTH:  August 24, 1941   DATE OF PROCEDURE:  DATE OF DISCHARGE:                               OPERATIVE REPORT   PREOPERATIVE DIAGNOSIS:  Anal fissure.   POSTOPERATIVE DIAGNOSIS:  Anal fissure.   PROCEDURE:  Left lateral internal sphincterotomy.   SURGEON:  Jonne Ply, M.D.   ANESTHESIA:  General.   DESCRIPTION:  The patient was taken to the operating room and placed in  supine position.  After adequate general anesthesia was induced, the  patient was posed in lithotomy position.  Perianal and rectal prep were  undertaken .  Using a 0.5 Marcaine with bicarb, the mucosa overlying the  internal sphincter muscle on the left side was injected.  Small incision  was made over this and the sphincter muscle was delivered up into the  wound with a hemostat.  It was divided with Bovie electrocautery.  Adequate hemostasis was insured.  Gelfoam packing was placed in the  rectum and perianal region.  Dressing was applied.  The patient  tolerated the procedure well and went to PACU in good condition.      Jonne Ply, MD  Electronically Signed     KRE/MEDQ  D:  07/14/2008  T:  07/15/2008  Job:  [id number]

## 2011-01-21 NOTE — Assessment & Plan Note (Signed)
[place] OFFICE NOTE   LAIN, TETTERTON                       MRN:          [id number]  DATE:12/20/2007                            DOB:          [birth year]    Mr. Reagle states that he had some mild rectal pain, rectal itching and  rectal bleeding following his colonoscopy in January, but these symptoms  rapidly resolved. He states he has between 1 and 4 bowel movements a  day, occasionally they are loose and urgent. He has not noted any  bleeding recently. He states that the Canasa suppository was not  available at his pharmacy in January and he did not call to notify us so  he has not been taking that as we recommended. His wife is with him  today and would like to change from Colazal to Azulfidine due to cost  reasons. He has not previously been tried on Azulfidine.   CURRENT MEDICATIONS:  1. Colazal 750 mg 3 p.o. t.i.d.  2. Lisinopril/hydrochlorothiazide 20/12.5 one daily.  3. Omeprazole 40 mg p.o. q.a.m.  4. Zolpidem one 10 mg q.h.s.   PHYSICAL EXAMINATION:  An overweight white male in no acute distress.  Weight 244.2 pounds, blood pressure is 122/64, pulse 88 and regular.  CHEST:  Clear to auscultation bilaterally.  CARDIAC:  Regular rate and rhythm without murmurs appreciated.  ABDOMEN:  Soft, nontender, nondistended, normal active bowel sounds, no  palpable organomegaly, masses or hernias.  NEUROLOGIC:  Alert and oriented x3. Grossly nonfocal.   ASSESSMENT/PLAN:  Ulcerative colitis. Symptoms under good but not  excellent control. Will begin Canasa 1000 mg suppositories q.h.s. as  previously recommended. He was advised to notify us if for some reason  this is not filled by his pharmacy correctly. Per the patient's and  wife's request, we will try Azulfidine 500 mg b.i.d. for 1 week and then  increase to 1000 mg b.i.d. for long-term usage. If his colitis is not  well controlled or if he has  side effects or intolerances to Azulfidine,  we will need to reevaluate. Plan for return office visit in 6 months.     [name]. Fuller Plan, MD, [facility]  Electronically Signed    MTS/MedQ  DD: 12/20/2007  DT: 12/20/2007  Job #: [id number]   cc:   Biagio Borg, MD

## 2011-01-24 NOTE — Assessment & Plan Note (Signed)
[place] OFFICE NOTE   [name], [name]                       MRN:          [id number]  DATE:11/25/2006                            DOB:          August 27, 1941    [name] returns for followup of ulcerative proctosigmoiditis.  His  symptoms are under excellent control.  He would like to change to  generic medications, if possible.   CURRENT MEDICATIONS:  Listed on the chart, updated and reviewed.   MEDICATION ALLERGIES:  IVP DYE.   EXAM:  No acute distress.  Weight 246.6 pounds.  Blood pressure is  148/72, pulse 94 and regular.  CHEST:  Clear to auscultation bilaterally.  CARDIAC:  Regular rate and rhythm without murmurs.  ABDOMEN:  Soft and nontender with normoactive bowel sounds.   ASSESSMENT AND PLAN:  1. GERD, complicated by short-segment Barrett's esophagus:  Maintain      standard antireflux measures.  Change to generic pantoprazole 40 mg      p.o. q.a.m.  Recall endoscopy plan for March 2009.  2. Ulcerative proctosigmoiditis:  Change to generic balsalazide 750 mg      3 p.o. t.i.d.  Recall colonoscopy recommended for March 2011.      Return office visit six months.     [name]. Fuller Plan, MD, [facility]  Electronically Signed    MTS/MedQ  DD: 11/25/2006  DT: 11/25/2006  Job #: [id number]

## 2011-01-24 NOTE — Discharge Summary (Signed)
NAMELAURIS, SERVISS                ACCOUNT NO.:  [id number]   MEDICAL RECORD NO.:  [id number]          PATIENT TYPE:  OBV   LOCATION:  3704                         FACILITY:  [place]   PHYSICIAN:  Wallis Bamberg. Johnsie Cancel, MD, FACCDATE OF BIRTH:  14-Apr-1941   DATE OF ADMISSION:  06/09/2006  DATE OF DISCHARGE:  06/10/2006                                 DISCHARGE SUMMARY   PROCEDURES:  None.   PRIMARY DISCHARGE DIAGNOSIS:  Chest pain.   SECONDARY DIAGNOSES:  1. Ulcerative colitis.  2. History of hemorrhoids.  3. History of nephrolithiasis.  4. Mild hypertension with a blood pressure 152/88 and beta blocker added      this admission.  5. Dyslipidemia with a total cholesterol of 166, triglycerides 135, HDL      26, LDL 113; medical therapy deferred at this time.  6. Allergy or intolerance to CONTRAST DYE.  7. Regular alcohol intake with one to two drinks daily.  8. Ongoing tobacco use with chewing tobacco.  9. Family history of coronary artery disease in his sister.  10.Status post cystoscopy and left knee surgery.  11.Gastroesophageal reflux disease symptoms.   TIME AT DISCHARGE:  33 minutes   HOSPITAL COURSE:  Mr. Dunn is a 70 year old male with no previous history  of coronary artery disease.  He began getting intermittent chest pain 5 days  ago.  He was having multiple episodes on a daily basis.  The pain was sharp  and left-sided but began radiating down his left arm.  As his symptoms  increased in a crescendo pattern, he became concerned and came to the  emergency room where he was admitted for further evaluation and treatment.   There were no obstructive symptoms of ischemia on his labs or EKG.  His  symptoms resolved with rest.  They were made worse by movement.  His D-dimer  was less than 0.22 and his point-of-care markers were negative.   The next day his symptoms had resolved.  His cardiac enzymes were negative  for MI.  His chest x-ray was within normal limits.  He was  evaluated by Dr.  Johnsie Cancel and considered stable for discharge with outpatient followup  arranged.   DISCHARGE INSTRUCTIONS:  1. His activity level is to be increased gradually.  2. He is to stick to a low-fat diet.  3. He is to follow up with an outpatient adenosine Myoview at our office      today at 11:30 a.m. and he is to follow up with Dr. Percival Spanish on October      16 at 9:30 a.m.   DISCHARGE MEDICATIONS:  1. Metoprolol 25 mg b.i.d.  2. Nitroglycerin 0.4 mg sublingual p.r.n.  3. Protonix 40 mg daily.  4. Colazal 350 mg t.i.d.  5. Aspirin 81 mg daily.     ______________________________  Rosaria Ferries, PA-C    ______________________________  Wallis Bamberg Johnsie Cancel, MD, [facility]    RB/MEDQ  D:  06/10/2006  T:  06/11/2006  Job:  [id number]

## 2011-01-24 NOTE — Assessment & Plan Note (Signed)
[place] OFFICE NOTE   JARRET, TORRE                       MRN:          [id number]  DATE:06/23/2006                            DOB:          1940/12/12    PRIMARY CARE PHYSICIAN:  [name], M.D.   REASON FOR PRESENTATION:  Patient with chest pain.   HISTORY OF PRESENT ILLNESS:  The patient was hospitalized overnight on  October 2 for evaluation of chest pain. He ruled out for myocardial  infarction.  He had significant cardiovascular risk factors.  Therefore, he  was sent for a stress profusion studies as an outpatient.  This demonstrated  his EF to be about 50% by this study.  There was no evidence of ischemia or  infarct.  He since has had one fleeting episode of chest discomfort.  He has  not been particularly active.  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  He has had no sustained substernal chest pressure, neck or arm  discomfort.  He has had no palpitations, presyncope, syncope.  He denies any  PND or orthopnea.   PAST MEDICAL HISTORY:  1. Hypertension.  2. Ulcerative colitis.  3. Gastroesophageal reflux disease.  4. Hemorrhoids.  5. Nephrolithiasis.  6. Chronic back pain.  7. Cystoscopy.  8. Left knee repair.   ALLERGIES:  None.   CURRENT MEDICATIONS:  1. Protonix 40 mg daily.  2. Cilostazol.   REVIEW OF SYSTEMS:  As stated in the HPI, otherwise negative for other  systems.   PHYSICAL EXAMINATION:  GENERAL:  The patient is in no distress.  VITAL SIGNS:  Blood pressure 168/92, heart rate 75 and regular.  Weight 151  pounds. Body mass index 37.  HEENT:  Eyes unremarkable.  Pupils equal, round and reactive to light, fundi  not visualized, oral mucosa unremarkable.  NECK:  No jugular venous distention at 45 degrees, carotid upstroke brisk  and symmetrical.  No bruits, thyromegaly.  LYMPHATICS:  No cervical, axillary, inguinal adenopathy.  LUNGS:  Clear to  auscultation bilaterally.  BACK:  No costovertebral angle tenderness.  CHEST:  Unremarkable.  HEART:  PMI not displaced or sustained, S1 and S2 within normal limits.  No  S3, no S4, no murmurs.  ABDOMEN:  Obese, positive bowel sounds.  Normal in frequency and pitch, no  bruits, no rebounding, no guarding, __________ midline pulse, no  hepatosplenomegaly.  SKIN:  No rashes, no nodules.  EXTREMITIES:  2+ pulses throughout.  No edema, no cyanosis or clubbing.  NEUROLOGIC:  Oriented to person, place and time.  Cranial nerves II through  XII grossly intact, motor grossly intact.   EKG:  Sinus rhythm, rate 75, axis within normal limits, intervals within  normal limits, no acute ST-T wave changes.   ASSESSMENT AND PLAN:  1. Chest pain.  The patient's chest pain is atypical.  There is no      objective evidence of ischemia.  No further cardiovascular testing is      suggested.  2. Hypertension.  Blood pressure is not controlled.  We discussed      therapeutic lifestyle changes (TLC).  This should include weight loss      and salt restriction.  I am going to start him on hydrochlorothiazide      12.5 mg daily.  I asked him to increase his potassium containing food      intake and we went over these.  He is going to come back in 2 weeks for      a BMET.  I have asked him to follow up in 6 to 8 weeks with Dr. Jenny Reichmann      for his bleed pressure.  3. Obesity.  He understands that he is obese.  We discussed weight loss      with diet and exercise.  4. Follow up can be back in this clinic as needed.            ______________________________  Minus Breeding, MD, [facility]     JH/MedQ  DD:  06/23/2006  DT:  06/24/2006  Job #:  [id number]   cc:   Biagio Borg, MD

## 2011-01-24 NOTE — Assessment & Plan Note (Signed)
[place]                           GASTROENTEROLOGY OFFICE NOTE   NAME:[name] [name], [name] [name]                       MRN:          [id number]  DATE:07/08/2006                            DOB:          Feb 10, 1941    REASON FOR VISIT:  Left-sided ulcerative colitis.   His colitis has been under very good control for the past several months.  He is having between one and three bowel movements a day without bleeding.  He states that he feels his colitis has been under much better control since  changing to Colazal.  Current medications listed on the chart updated and  reviewed.   ALLERGIES:  No known drug allergies.   PHYSICAL EXAMINATION:  GENERAL:  In no acute distress.  VITAL SIGNS:  Weight 244 pounds, blood pressure 130/74, pulse 72 and  regular.  CHEST:  Clear to auscultation bilaterally.  CARDIAC:  Regular rate and rhythm without murmurs.  ABDOMEN:  Soft, nontender with normoactive bowel sounds.   ASSESSMENT/PLAN:  1. Left-sided colitis.  Disease stable.  Continue Colazal at 750 mg three      pills p.o. t.i.d.  Return office visit in one month.  2. Gastroesophageal reflux disease.  Symptoms under good control.      Continue Protonix 40 mg p.o. q.a.m. and standard antireflux measures.      Return office visit in 6 months.     [name]. Fuller Plan, MD, [facility]  Electronically Signed    MTS/MedQ  DD: 07/14/2006  DT: 07/14/2006  Job #: [id number]

## 2011-01-24 NOTE — H&P (Signed)
[name] [name], [name] NO.:  [id number]   MEDICAL RECORD NO.:  [id number]          PATIENT TYPE:  INP   LOCATION:  6812                         FACILITY:  [place]   PHYSICIAN:  Minus Breeding, MD, FACCDATE OF BIRTH:  08/14/1941   DATE OF ADMISSION:  06/09/2006  DATE OF DISCHARGE:  06/10/2006                                HISTORY & PHYSICAL   SUMMARY OF HISTORY:  [name] is a 70 year old white male who presents to  [facility] emergency room complaining of chest discomfort.   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  This has been occurring on and off all week and seems to worsen with  exertion or upper extremity movement.  The discomfort actually began  radiating 2 days ago into his left arm extending down to his fingers.  Again, this has continued to come and go.  The patient became concerned when  it began going down his left arm, and after he talked to a coworker who told  him that he should be evaluated for a heart attack.  He states that it may  occur 2-3 times a minute.  He initially felt that it was a pulled muscle.   On presentation to the emergency room, he has continued to have chest  discomfort where it will last seconds at a time.  However, he recollects it  does not always occur with movement, can occur also at rest.  It has  awakened him at night.  He has not tried any medications such as Tylenol or  ibuprofen for relief this past week.   In the emergency room he received chewable aspirin, IV Lopressor and some  sublingual nitroglycerin which he thinks may have relieved his discomfort  somewhat.  However, he continues to have chest discomfort.   PAST MEDICAL HISTORY:  He has a contrast dye allergy, for which he develops  welts.   CURRENT MEDICATIONS:  Includes Colazal 350 mg t.i.d., Prilosec over-the-  counter 20 mg q.d.   PAST MEDICAL HISTORY:   His medical history is notable for left-sided  ulcerative colitis, gastroesophageal reflux disease, hemorrhoids, kidney  stones, and chronic back problems with DJD.   PAST SURGICAL HISTORY:  Is notable for a cystoscopy and left knee repair.   SOCIAL HISTORY:  He lives with his wife and daughter.  However, he has 2  other biological children.  He is employed as a Administrator, Radiation protection practitioner.  He  does chew tobacco, approximately one pack per day.  He drinks two to three  beers per day, occasionally more.  He has not had any problems with  withdrawal.  Denies any drugs or herbal medication use.  He does not follow  a diet.  He does not participate in a regular exercise program.   FAMILY HISTORY:  Mother is deceased from emphysema.  Father is deceased from  emphysema, unknown ages.  His sister is currently undergoing evaluation for  heart trouble.  His brother has undergone liver transplantation.   REVIEW  OF SYSTEMS:  Is notable for weight gain, per his wife, although  specific amount is unknown.  Glasses.  Chronic dyspnea on exertion without  recent change, back arthralgias and GERD.  The remainder of systems are  negative.   PHYSICAL EXAMINATION:  GENERAL:  Well-nourished, well-developed, pleasant  obese white male in no apparent distress.  VITAL SIGNS:  Temperature is 98, blood pressure is 152/88, pulse is 80,  respirations is 80, 97% sat on room air.  HEENT:  Unremarkable.  Normocephalic.  PERRLA.  Glasses.  NECK:  Supple without thyromegaly, adenopathy, JVD or carotid bruits.  CHEST:  Symmetrical excursion.  Clear to auscultation without wheezes, rales  or rhonchi.  HEART:  PMI is not placed, regular rate and rhythm.  Normal S1-S2; did not  appreciate any murmurs, rubs, clicks or gallops.  SKIN:  Integument is intact without rashes or wheezes.  ABDOMEN:  Obese.  Bowel sounds present without organomegaly, masses or  tenderness.  EXTREMITIES:  Negative cyanosis, clubbing or edema.   Peripheral pulses are  symmetrical and intact without abdominal or femoral bruits.  MUSCULOSKELETAL:  Was grossly intact.  However, he has pain reproduction on  palpation to the chest.  He does not have reproduction of pain with full  range of motion of his upper extremities.  NEUROLOGIC:  Grossly intact.   DIAGNOSTICS:  CHEST X-RAY:  No acute findings.   EKG shows normal sinus rhythm, left axis deviation, ventricular rate of 77,  normal intervals.  No significant change from EKG from November of 2006.   LABORATORY DATA:  H and H are 15.9 and 46.5 with normal indices, platelets  of 194 and WBC of 5.7.  Sodium is 139, potassium is 4.7, BUN is 12,  creatinine is 1, and glucose is 106.  Normal LFTs.  Cardiac care markers are  negative x2.  CK-MB and troponin are within normal limits.  PTT is 30 and PT  is 13.3.  D-dimer is less than 0.22.  Magnesium is 2.   IMPRESSION:  1. Atypical chest discomfort, probable musculoskeletal.  2. Hypertension.  3. Obesity.  4. Tobacco use.  5. History as listed per past medical history.   DISPOSITION:  Dr. Percival Spanish reviewed the patient's history, spoke with and  examined the patient and agrees with the above.  We will admit [name] to  observation to rule out myocardial infarction.  If enzymes and EKGs are  negative over a period of 24 hours, will arrange an outpatient stress  Myoview to further evaluate potential ischemia.  We will do a hemoglobin A1C, TSH, fasting lipids and an EKG in the morning  for further evaluation.  We will continue him on his home medications, as  well as adding an aspirin and low-dose beta blocker.  We will also make sure  he is started on some ibuprofen for his probable musculoskeletal discomfort.     ______________________________  Sharyl Nimrod, PA-C    ______________________________  Minus Breeding, MD, [facility]    EW/MEDQ  D:  06/09/2006  T:  06/10/2006  Job:  [id number]   cc:   Biagio Borg, MD [name]. Fuller Plan,  MD, [facility]

## 2011-01-24 NOTE — Op Note (Signed)
[name] [name], [name] [name]                ACCOUNT NO.:  [id number]   MEDICAL RECORD NO.:  [id number]          PATIENT TYPE:  AMB   LOCATION:  [facility]                         [facility]AC[facility]   PHYSICIAN:  Synthia Innocent, M.D.  DATE OF BIRTH:  Feb 28, 1941   DATE OF PROCEDURE:  07/25/2005  DATE OF DISCHARGE:                                 OPERATIVE REPORT   PREOPERATIVE DIAGNOSIS:  A 9 x 6 mm distal left ureteral calculus.   POSTOPERATIVE DIAGNOSIS:  A 9 x 6 mm distal left ureteral calculus.   OPERATION:  An attempted catheterization of left ureter.   SURGEON:  Synthia Innocent, M.D.   HISTORY:  This is a 70 year old gentleman who recently presented with an  episode of painless gross hematuria. Subsequent CAT scan showed about a 9 x  6 nonobstructing mm stone in the distal left ureter. Several KUBs including  the one today still showed the stone in the very distal left ureter. He  comes in today for an attempt at stone lithotripsy with a holmium laser. I  explained the procedure to him including the possible complications such as  inability to access the ureter, inadvertent perforation or inability to  break up the stone. He understands and agrees to the proposed surgery.   PROCEDURE:  The patient was prepped and draped in the dorsal lithotomy  position and under intubated general anesthesia LMA, cystoscopy revealed a  normal anterior urethra. He had rather prominent hemorrhagic kissing lateral  lobes and an enlarged median lobe. The bladder itself looked grossly normal  except there was, as expected, a fair amount of edema, bullous edema and  hemorrhage around the left ureteral orifice which during the entire case I  was never able to really identify adequately even with injection of indigo  carmine. Initially, I tried to pass up a 0.038 Glidewire without success. We  spent several minutes trying to identify the orifice. I then put up a 4-  [place] whistle-tip pigtail catheter and a we  thought we did intubate the  ureter, but it would not bypass the stone. We then went back and again tried  to intubate where we thought was the orifice but as time went on the  bleeding got worse, the prostate started to bleed, the visualization became  poor and at this point even though we could see indigo carmine coming out  the right ureteral orifice we never were able to see for sure anything out  the left so I terminated the procedure, emptied the bladder. We put a B&o  suppository in for anesthetic purposes and also Xylocaine jelly up the  urethra. He was then taken back to the recovery room in satisfactory  condition.   I need to consider other options such as lithotripsy or possibly have him go  to interventional radiology for an attempt at antegrade insertion of a  catheter and double-J stent.      Synthia Innocent, M.D.  Electronically Signed     RFS/MEDQ  D:  07/25/2005  T:  07/25/2005  Job:  [id number]

## 2011-03-28 ENCOUNTER — Other Ambulatory Visit: Payer: Self-pay

## 2011-03-28 MED ORDER — ZOLPIDEM TARTRATE 10 MG PO TABS: 10.0000 mg | ORAL_TABLET | Freq: Every evening | ORAL | Status: DC | PRN

## 2011-03-28 NOTE — Telephone Encounter (Signed)
Faxed hardcopy to [facility] [phone]

## 2011-04-11 ENCOUNTER — Other Ambulatory Visit (INDEPENDENT_AMBULATORY_CARE_PROVIDER_SITE_OTHER): Payer: Medicare Other

## 2011-04-11 ENCOUNTER — Other Ambulatory Visit: Payer: Self-pay | Admitting: Internal Medicine

## 2011-04-11 DIAGNOSIS — E7800 Pure hypercholesterolemia, unspecified: Secondary | ICD-10-CM

## 2011-04-11 DIAGNOSIS — I10 Essential (primary) hypertension: Secondary | ICD-10-CM

## 2011-04-11 DIAGNOSIS — R7309 Other abnormal glucose: Secondary | ICD-10-CM

## 2011-04-11 DIAGNOSIS — Z79899 Other long term (current) drug therapy: Secondary | ICD-10-CM

## 2011-04-11 LAB — BASIC METABOLIC PANEL
BUN: 16 mg/dL (ref 6–23)
Chloride: 102 mEq/L (ref 96–112)
Glucose, Bld: 117 mg/dL — ABNORMAL HIGH (ref 70–99)
Potassium: 4.2 mEq/L (ref 3.5–5.1)

## 2011-04-11 LAB — HEPATIC FUNCTION PANEL
ALT: 20 U/L (ref 0–53)
AST: 19 U/L (ref 0–37)
Albumin: 4.2 g/dL (ref 3.5–5.2)
Total Protein: 7 g/dL (ref 6.0–8.3)

## 2011-04-11 LAB — LIPID PANEL
Cholesterol: 204 mg/dL — ABNORMAL HIGH (ref 0–200)
Total CHOL/HDL Ratio: 6

## 2011-04-13 ENCOUNTER — Encounter: Payer: Self-pay | Admitting: Internal Medicine

## 2011-04-13 DIAGNOSIS — Z0000 Encounter for general adult medical examination without abnormal findings: Secondary | ICD-10-CM

## 2011-04-13 DIAGNOSIS — Z0001 Encounter for general adult medical examination with abnormal findings: Secondary | ICD-10-CM | POA: Insufficient documentation

## 2011-04-13 DIAGNOSIS — R7302 Impaired glucose tolerance (oral): Secondary | ICD-10-CM

## 2011-04-13 HISTORY — DX: Impaired glucose tolerance (oral): R73.02

## 2011-04-18 ENCOUNTER — Ambulatory Visit (INDEPENDENT_AMBULATORY_CARE_PROVIDER_SITE_OTHER): Payer: Medicare Other | Admitting: Internal Medicine

## 2011-04-18 ENCOUNTER — Encounter: Payer: Self-pay | Admitting: Internal Medicine

## 2011-04-18 VITALS — BP 108/62 | HR 74 | Temp 98.0°F | Ht 68.0 in | Wt 245.1 lb

## 2011-04-18 DIAGNOSIS — R7302 Impaired glucose tolerance (oral): Secondary | ICD-10-CM

## 2011-04-18 DIAGNOSIS — I10 Essential (primary) hypertension: Secondary | ICD-10-CM

## 2011-04-18 DIAGNOSIS — R7309 Other abnormal glucose: Secondary | ICD-10-CM

## 2011-04-18 DIAGNOSIS — Z0000 Encounter for general adult medical examination without abnormal findings: Secondary | ICD-10-CM

## 2011-04-18 DIAGNOSIS — E785 Hyperlipidemia, unspecified: Secondary | ICD-10-CM

## 2011-04-18 DIAGNOSIS — N62 Hypertrophy of breast: Secondary | ICD-10-CM
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
## 2011-04-18 NOTE — Patient Instructions (Signed)
Continue all other medications as before Please focus on exercise, wt loss, and lower cholesterol diet Please return in 6 mo with Lab testing done 3-5 days before

## 2011-04-19 ENCOUNTER — Encounter: Payer: Self-pay | Admitting: Internal Medicine

## 2011-04-19 NOTE — Assessment & Plan Note (Signed)
stable overall by hx and exam, most recent data reviewed with pt, and pt to continue medical treatment as before; is quite elevated but declines statin

## 2011-04-19 NOTE — Assessment & Plan Note (Signed)
For lab eval next visit

## 2011-04-19 NOTE — Assessment & Plan Note (Signed)
stable overall by hx and exam, most recent data reviewed with pt, and pt to continue medical treatment as before  BP Readings from Last 3 Encounters:  04/18/11 108/62  10/25/10 124/78  10/18/10 120/70

## 2011-04-19 NOTE — Assessment & Plan Note (Signed)
Pt decines specific med tx at this time, wants to try to lose wt, with diet and more actvitiy

## 2011-04-29 ENCOUNTER — Encounter (INDEPENDENT_AMBULATORY_CARE_PROVIDER_SITE_OTHER): Payer: Self-pay | Admitting: Surgery

## 2011-04-29 ENCOUNTER — Ambulatory Visit (INDEPENDENT_AMBULATORY_CARE_PROVIDER_SITE_OTHER): Payer: Medicare Other | Admitting: Surgery

## 2011-04-29 VITALS — BP 132/72 | HR 64 | Temp 97.4°F | Ht 68.0 in | Wt 242.6 lb

## 2011-04-29 DIAGNOSIS — R1031 Right lower quadrant pain: Secondary | ICD-10-CM

## 2011-04-29 DIAGNOSIS — N509 Disorder of male genital organs, unspecified: Secondary | ICD-10-CM

## 2011-04-29 MED ORDER — AMITRIPTYLINE HCL 150 MG PO TABS: 75.0000 mg | ORAL_TABLET | Freq: Every day | ORAL | Status: DC

## 2011-04-29 NOTE — Progress Notes (Signed)
Subjective:     Patient ID: [name] [name], male   DOB: Jan 15, 1941, 70 y.o.   MRN: [id number]  HPI  Reason for visit: Persistent right groin and testicle pain. Question of recurrent inguinal hernia.  Patient is a morbidly obese male whom I performed a laparoscopic bilateral inguinal & umbilical hernia repairs in 2009. He recovered well from that initially. Then in the 2010 he had an episode of severe right groin pain after doing some heavy lifting. He saw me. I recommended trying pain control & deceased activity.  If not improved, then consider an injection and CT scan.  I thought it may be epididymitis and started Ciprofloxacin.  He wished a second opinion.   He saw my partner, Dr. Harlow Asa, and got a CAT scan. There was no evidence of hernia. He injected x 1 and recommended considering a follow up in the pain clinic and perhaps Urology.  Six months later, the patient saw Dr. Jeffie Pollock whom did not see evidence of any epididymitis or other maladies. He recommended the patient start Elavil & consider a chronic pain evaluation.  The patient notes he still had persistent pain. He takes ibuprofen occasionally. No ice or heat. He recall I asked him about feet started the Elavil medication to control his chronic pain that Dr. Jeffie Pollock ordered in February 2012. He does not know if he started that or not. He did not ever go to a pain specialist.  The patient notes that the pain is in his right groin and radiates to his testicle. Sometimes it comes up to his right hip. It's worse when he sits for a while and activity. He wonders if he feels a bulge there from time to time. No nausea or vomiting. Daily bowel movements. No urination. He does not smoke. He still moderately active. Review of Systems  Constitutional: Negative for fever, chills and diaphoresis.  HENT: Negative for nosebleeds, sore throat, facial swelling, mouth sores, trouble swallowing and ear discharge.   Eyes: Negative for photophobia, discharge and  visual disturbance.  Respiratory: Negative for choking, chest tightness, shortness of breath and stridor.   Cardiovascular: Negative for chest pain and palpitations.  Gastrointestinal: Negative for nausea, vomiting, abdominal pain, diarrhea, constipation, blood in stool, abdominal distention, anal bleeding and rectal pain.  Genitourinary: Positive for testicular pain. Negative for dysuria, urgency, discharge, scrotal swelling, difficulty urinating and penile pain.  Musculoskeletal: Negative for myalgias, back pain, arthralgias and gait problem.  Skin: Negative for color change, pallor, rash and wound.  Neurological: Negative for dizziness, speech difficulty, weakness, numbness and headaches.  Hematological: Negative for adenopathy. Does not bruise/bleed easily.  Psychiatric/Behavioral: Negative for hallucinations, confusion and agitation.       Objective:   Physical Exam  Constitutional: He is oriented to person, place, and time. He appears well-developed and well-nourished. No distress.  HENT:  Head: Normocephalic.  Mouth/Throat: Oropharynx is clear and moist. No oropharyngeal exudate.  Eyes: Conjunctivae and EOM are normal. Pupils are equal, round, and reactive to light. No scleral icterus.  Neck: Normal range of motion. Neck supple. No tracheal deviation present.  Cardiovascular: Normal rate, regular rhythm and intact distal pulses.   Pulmonary/Chest: Effort normal and breath sounds normal. No respiratory distress.  Abdominal: Soft. He exhibits no distension. There is no tenderness. Hernia confirmed negative in the right inguinal area and confirmed negative in the left inguinal area.       Morbidly obese.  No umbilical pain/hernia.  Genitourinary: Penis normal. No penile tenderness.  Sensitive at right pubic rim & testicle.  Prominent mons pubis but symmetrical and No SQ mass.  No hernia.  R>L sensitivity at external ring.  After discussion of options & obtaining informed consent,  I injected the RLQ & groin for an ilioinguinal, genitofemoral, & spermatic cord field blocks with lidocaine/bupivicaine, & kenalog steroid in 46m total.  He tolerated it well & noted his pain was much less.  Musculoskeletal: Normal range of motion. He exhibits no tenderness.  Lymphadenopathy:    He has no cervical adenopathy.       Right: No inguinal adenopathy present.       Left: No inguinal adenopathy present.  Neurological: He is alert and oriented to person, place, and time. No cranial nerve deficit. He exhibits normal muscle tone. Coordination normal.  Skin: Skin is warm and dry. No rash noted. He is not diaphoretic. No erythema. No pallor.  Psychiatric: He has a normal mood and affect. His behavior is normal. Judgment and thought content normal.       Assessment:     Right groin and testicular pain, most likely due to chronic nerve pain.  Improved s/p injection    Plan:    Elavil QHS, Ibuprofen QID, Ice/Heat QID, decreased lifting for 2 months.  RTC 2 weeks for probable repeat injection.  If no improvement, consider diagnostic laparoscopy to r/o recurrent hernia.  Reconsider Pain Clinic specialist evaluation.

## 2011-04-29 NOTE — Patient Instructions (Addendum)
Inguinal Strain Your exam shows you have an inguinal strain. This is also known as a pulled groin. This injury is usually due to a pull or partial tear to a muscle or tendon in the groin area. Most groin pulls take several months to heal completely. There may be pain with lifting your leg or walking during much of your recovery. Treatment for groin strains includes:  Rest and avoid lifting or performing activities that increase your pain.   Apply ice packs for 20-30 minutes every few hours to reduce pain and swelling over the next 2-3 days.   Medicine to reduce pain and inflammation is often prescribed (ibuprofen 611m 4x/day.  Amitryptilline 745mat night for 2 months).. Marland KitchenHOME CARE INSTRUCTIONS While most strains in the groin area will heal with rest, you should also watch for any signs of a more serious condition.  SEEK IMMEDIATE MEDICAL CARE IF:  You notice unusual swelling or bulging in the groin.   You have pain or swelling in the testicle.   Blood in your urine.   Marked increased pain.   Weakness or numbness of your leg or abdominal pain.  MAKE SURE YOU:   Understand these instructions.   Will watch your condition.   Will get help right away if you are not doing well or get worse.  Document Released: 10/02/2004 Document Re-Released: 08/07/2008 [facility] Information 2011 [place] Managing Pain  Pain after surgery or related to activity is often due to strain/injury to muscle, tendon, nerves and/or incisions.  This pain is usually short-term and will improve in a few months.   Many people find it helpful to do the following things TOGETHER to help speed the process of healing and to get back to regular activity more quickly:  1. Avoid heavy physical activity a.  no lifting greater than 20 pounds b. Do not "push through" the pain.  Listen to your body and avoid positions and maneuvers than reproduce the pain c. Walking is okay as tolerated, but go slowly  and stop when getting sore.  d. Remember: If it hurts to do it, then don't do it! 2. Take Anti-inflammatory medication  a. Take with food/snack around the clock for 1-2 weeks i. This helps the muscle and nerve tissues become less irritable and calm down faster b. Choose ONE of the following over-the-counter medications: i. Naproxen 22029mabs (ex. Aleve) 1-2 pills twice a day  ii. Ibuprofen 200m72mbs (ex. Advil, Motrin) 3-4 pills with every meal and just before bedtime iii. Acetaminophen 500mg3ms (Tylenol) 1-2 pills with every meal and just before bedtime 3. Use a Heating pad or Ice/Cold Pack a. 4-6 times a day b. May use warm bath/hottub  or showers 4. Try Gentle Massage and/or Stretching  a. at the area of pain many times a day b. stop if you feel pain - do not overdo it  Try these steps together to help you body heal faster and avoid making things get worse.  Doing just one of these things may not be enough.    If you are not getting better after two weeks or are noticing you are getting worse, contact our office for further advice; we may need to re-evaluate you & see what other things we can do to help.

## 2011-05-14 ENCOUNTER — Ambulatory Visit (INDEPENDENT_AMBULATORY_CARE_PROVIDER_SITE_OTHER): Payer: Medicare Other | Admitting: Surgery

## 2011-05-14 VITALS — BP 130/74 | HR 64 | Temp 96.5°F | Ht 68.0 in | Wt 240.2 lb

## 2011-05-14 DIAGNOSIS — R1031 Right lower quadrant pain: Secondary | ICD-10-CM

## 2011-05-14 MED ORDER — GABAPENTIN 300 MG PO CAPS: 300.0000 mg | ORAL_CAPSULE | Freq: Three times a day (TID) | ORAL | Status: DC

## 2011-05-14 NOTE — Progress Notes (Signed)
Subjective:     Patient ID: [name] [name], male   DOB: [birth year], 70 y.o.   MRN: [id number]  HPI   Reason for visit: Persistent right groin and testicle pain after Lap BIH & UHR 2009.  The patient notes he had one week of relief from the injection on last visit.    He gradually got recurrent pain. He takes ibuprofen 1-2x daily. Ice 1x daily.   He notes he gets rectal bleeding with aspirin. He wondered if he would get that from ibuprofen. He tried the Elavil for a few days. He was told his voice was changing. He felt  weak. He stopped it. He did not call me.  He denies any lump anymore. He notes he is not getting pain with activity as much, but  when he is rolling on this side or when he bends over. Denies much testicular pain now.  Still occ radiation up the groin to the right ASIS of hip.  Urinating okay. Normal bowel movements.  Review of Systems  Constitutional: Negative for fever, chills and diaphoresis.  HENT: Negative for nosebleeds, sore throat, facial swelling, mouth sores, trouble swallowing and ear discharge.   Eyes: Negative for photophobia, discharge and visual disturbance.  Respiratory: Negative for choking, chest tightness, shortness of breath and stridor.   Cardiovascular: Negative for chest pain and palpitations.  Gastrointestinal: Negative for nausea, vomiting, abdominal pain, diarrhea, constipation, blood in stool, abdominal distention, anal bleeding and rectal pain.  Genitourinary: Positive for testicular pain. Negative for dysuria, urgency, discharge, scrotal swelling, difficulty urinating and penile pain.  Musculoskeletal: Negative for myalgias, back pain, arthralgias and gait problem.  Skin: Negative for color change, pallor, rash and wound.  Neurological: Negative for dizziness, speech difficulty, weakness, numbness and headaches.  Hematological: Negative for adenopathy. Does not bruise/bleed easily.  Psychiatric/Behavioral: Negative for hallucinations, confusion  and agitation.       Objective:   Physical Exam  Constitutional: He is oriented to person, place, and time. He appears well-developed and well-nourished. No distress.  HENT:  Head: Normocephalic.  Mouth/Throat: Oropharynx is clear and moist. No oropharyngeal exudate.  Eyes: Conjunctivae and EOM are normal. Pupils are equal, round, and reactive to light. No scleral icterus.  Neck: Normal range of motion. Neck supple. No tracheal deviation present.  Cardiovascular: Normal rate, regular rhythm and intact distal pulses.   Pulmonary/Chest: Effort normal and breath sounds normal. No respiratory distress.  Abdominal: Soft. He exhibits no distension. There is no tenderness. Hernia confirmed negative in the right inguinal area and confirmed negative in the left inguinal area.       Morbidly obese.  No umbilical pain/hernia.  Genitourinary: Penis normal. No penile tenderness.       Sensitive at right pubic rim & cord.  Prominent mons pubis but symmetrical and No SQ mass.  No hernia.   Sensitivity at Right external ring.  After discussion of options & obtaining informed consent, I re-injected the groin for genitofemoral, & spermatic cord field blocks with lidocaine/bupivicaine, & kenalog steroid in 61m total.  He tolerated it well & noted his pain was much less.  Musculoskeletal: Normal range of motion. He exhibits no tenderness.       Moving more easily but tends to stretch his RLE out & slow with hip flexion.  Lymphadenopathy:    He has no cervical adenopathy.       Right: No inguinal adenopathy present.       Left: No inguinal adenopathy present.  Neurological: He  is alert and oriented to person, place, and time. No cranial nerve deficit. He exhibits normal muscle tone. Coordination normal.  Skin: Skin is warm and dry. No rash noted. He is not diaphoretic. No erythema. No pallor.  Psychiatric: He has a normal mood and affect. His behavior is normal. Judgment and thought content normal.         Assessment:     Right groin and testicular pain, most likely due to chronic nerve pain.  Improved s/p injection    Plan:   Try Neurontin TID (call if not working), Tylenol QID, Ice/Heat QID, decreased lifting for 2 months.  Avoid NSAIDS with h/o Ulcerative colitis - defer to Dr. Fuller Plan with GI on UC f/u.  RTC 1-2 weeks for probable repeat injection.  If no improvement, reconsider Pain Clinic specialist evaluation with Dr. Wilford Grist.  I would hold off on any surgery for now.  I re-explained numerous times & gave written instructions.  He agrees.

## 2011-05-14 NOTE — Patient Instructions (Addendum)
Take Tylenol 500-1067m  4 times a day.  Use Ice/Heat 4 times a day.  Try neurontin 3x/day to control pain.  Consider appointment with chronic pain specialist (Dr. WDarrick Penna [facility] [place] ([phone] to help control pain.

## 2011-05-26 ENCOUNTER — Encounter (INDEPENDENT_AMBULATORY_CARE_PROVIDER_SITE_OTHER): Payer: Self-pay | Admitting: Surgery

## 2011-05-26 ENCOUNTER — Ambulatory Visit (INDEPENDENT_AMBULATORY_CARE_PROVIDER_SITE_OTHER): Payer: Medicare Other | Admitting: Surgery

## 2011-05-26 DIAGNOSIS — R1031 Right lower quadrant pain: Secondary | ICD-10-CM

## 2011-05-26 NOTE — Patient Instructions (Signed)
Take Tylenol 4 times a day (635m = two 325 mg tablets) to help reduce soreness.  See Dr. WJeffie Pollockwith [facility] [phone] follow-up on difficulty urinating.    Consider [facility] [place] ([phone] for a Chronic Pain evaluation.

## 2011-05-26 NOTE — Progress Notes (Signed)
Subjective:     Patient ID: [name] [name], male   DOB: 1940-12-01, 70 y.o.   MRN: [id number]  HPI   Reason for visit: Persistent right groin and testicle pain after Lap BIH & UHR 2009.  The patient notes he had more relief from the 2nd injection on last visit.    He gradually got recurrent pain. He takes Tylenol 1-2 times a day. He is afraid to try Advil or Aleve since he had bleeding with aspirin and feels that "it has aspirin in it."  He comes today with his daughter. She notes he seems to have been confused about recommendations.  He tried the Neurontin for a few days. He felt weird with it & stopped it. Again, he did not call me.  It was not the same reaction as he had with his Elavil where his voice seemed like it changed and he felt odd when moving around.  He denies any lump anymore. He notes he is not getting pain with activity as much, bending at the hip much better.  However, he feels it when he is lying on this side.  Denies much testicular pain now.  Still occ radiation up the groin to the right ASIS of hip.   He does note that he has to get up to urinate a few times night.  Strains to start a stream.  No burning nor foul odor.  Daily bowel movements.  Review of Systems  Constitutional: Negative for fever, chills and diaphoresis.  HENT: Negative for nosebleeds, sore throat, facial swelling, mouth sores, trouble swallowing and ear discharge.   Eyes: Negative for photophobia, discharge and visual disturbance.  Respiratory: Negative for choking, chest tightness, shortness of breath and stridor.   Cardiovascular: Negative for chest pain and palpitations.  Gastrointestinal: Negative for nausea, vomiting, abdominal pain, diarrhea, constipation, blood in stool, abdominal distention, anal bleeding and rectal pain.  Genitourinary: Positive for urgency, difficulty urinating and testicular pain. Negative for dysuria, discharge, scrotal swelling and penile pain.       Urinating 2-3x/night.   Some hesitation.  No burning nor foul odor  Musculoskeletal: Negative for myalgias, back pain, arthralgias and gait problem.  Skin: Negative for color change, pallor, rash and wound.  Neurological: Negative for dizziness, speech difficulty, weakness, numbness and headaches.  Hematological: Negative for adenopathy. Does not bruise/bleed easily.  Psychiatric/Behavioral: Negative for hallucinations, confusion and agitation.       Objective:   Physical Exam  Constitutional: He is oriented to person, place, and time. He appears well-developed and well-nourished. No distress.  HENT:  Head: Normocephalic.  Mouth/Throat: Oropharynx is clear and moist. No oropharyngeal exudate.  Eyes: Conjunctivae and EOM are normal. Pupils are equal, round, and reactive to light. No scleral icterus.  Neck: Normal range of motion. Neck supple. No tracheal deviation present.  Cardiovascular: Normal rate, regular rhythm and intact distal pulses.   Pulmonary/Chest: Effort normal and breath sounds normal. No respiratory distress.  Abdominal: Soft. He exhibits no distension and no mass. There is no tenderness. There is no rebound and no guarding. Hernia confirmed negative in the right inguinal area and confirmed negative in the left inguinal area.       Morbidly obese.  No umbilical pain/hernia.  Genitourinary: Penis normal. No penile tenderness.       Still moderately sensitive at right pubic rim > cord.  Prominent mons pubis but symmetrical and No SQ mass.  No hernia.   Minimal sensitivity at right external ring, much improved.  After discussion of options & obtaining informed consent, I re-injected the groin for genitofemoral nerve, & spermatic cord field blocks with lidocaine/bupivicaine, & kenalog steroid in 71m total.  He tolerated it well & noted his pain was much less.  Musculoskeletal: Normal range of motion. He exhibits no tenderness.       Moving more easily but tends to stretch his RLE out & slow with hip  flexion.  Lymphadenopathy:    He has no cervical adenopathy.       Right: No inguinal adenopathy present.       Left: No inguinal adenopathy present.  Neurological: He is alert and oriented to person, place, and time. No cranial nerve deficit. He exhibits normal muscle tone. Coordination normal.  Skin: Skin is warm and dry. No rash noted. He is not diaphoretic. No erythema. No pallor.  Psychiatric: He has a normal mood and affect. His behavior is normal. Judgment and thought content normal.       Assessment:     Right groin and testicular pain, most likely due to chronic nerve pain.  Improved s/p nerve block injection x2.  3rd done today    Plan:   Try Tylenol QID, Ice/Heat QID, decreased lifting for 2 months.  Avoid NSAIDS with h/o Ulcerative colitis - defer to Dr. SFuller Planwith GI on UC f/u.  I really don't want to start narcotics on him, especially if he is getting better.  Did not tolerate Elavil nor Neurontin which usually has been helpful.  See Dr. WJeffie Pollock/ [facility] for possible BPH symptoms.  He was supposed to see Urology 6 weeks after the visit in February 2012 anyway.  I held off on any urine studies or PO Cipro trial since I believe he had that done this year & it was not revealing.  If no improvement, reconsider Pain Clinic specialist evaluation [facility]  Again, I wrote down the number & gave it to him.     I offered to do Diagnostic laparoscopy to rule out a hernia not seen on the 2 CT scans & numerous exams.  He wishes to hold off on any surgery for now.  I re-explained numerous times & gave written instructions.  He agrees.

## 2011-05-28 ENCOUNTER — Other Ambulatory Visit (INDEPENDENT_AMBULATORY_CARE_PROVIDER_SITE_OTHER): Payer: Self-pay | Admitting: Surgery

## 2011-05-29 LAB — BASIC METABOLIC PANEL
CO2: 27
Chloride: 102
GFR calc non Af Amer: 53 — ABNORMAL LOW
Glucose, Bld: 118 — ABNORMAL HIGH
Potassium: 5.5 — ABNORMAL HIGH
Sodium: 141

## 2011-06-10 LAB — BASIC METABOLIC PANEL
CO2: 31
Calcium: 9.3
GFR calc Af Amer: >60
Sodium: 137

## 2011-06-10 LAB — HEMOGLOBIN AND HEMATOCRIT, BLOOD: HCT: 49.6

## 2011-09-24 ENCOUNTER — Other Ambulatory Visit: Payer: Self-pay

## 2011-09-24 MED ORDER — ZOLPIDEM TARTRATE 5 MG PO TABS: 5.0000 mg | ORAL_TABLET | Freq: Every evening | ORAL | Status: DC | PRN

## 2011-09-24 NOTE — Telephone Encounter (Signed)
Faxed hardcopy to pharmacy. 

## 2011-09-24 NOTE — Telephone Encounter (Signed)
Done hardcopy to robin  

## 2011-09-25 ENCOUNTER — Other Ambulatory Visit: Payer: Self-pay

## 2011-09-25 MED ORDER — ZOLPIDEM TARTRATE 5 MG PO TABS: 5.0000 mg | ORAL_TABLET | Freq: Every evening | ORAL | Status: DC | PRN

## 2011-09-26 NOTE — Telephone Encounter (Signed)
Faxed hardcopy to pharmacy. 

## 2011-10-20 ENCOUNTER — Ambulatory Visit (INDEPENDENT_AMBULATORY_CARE_PROVIDER_SITE_OTHER): Payer: Medicare Other | Admitting: Internal Medicine

## 2011-10-20 ENCOUNTER — Encounter: Payer: Self-pay | Admitting: Internal Medicine

## 2011-10-20 ENCOUNTER — Other Ambulatory Visit (INDEPENDENT_AMBULATORY_CARE_PROVIDER_SITE_OTHER): Payer: Medicare Other

## 2011-10-20 VITALS — BP 108/80 | HR 77 | Temp 97.3°F | Ht 68.0 in | Wt 236.4 lb

## 2011-10-20 DIAGNOSIS — R7302 Impaired glucose tolerance (oral): Secondary | ICD-10-CM

## 2011-10-20 DIAGNOSIS — N62 Hypertrophy of breast: Secondary | ICD-10-CM

## 2011-10-20 DIAGNOSIS — Z0000 Encounter for general adult medical examination without abnormal findings: Secondary | ICD-10-CM

## 2011-10-20 DIAGNOSIS — Z125 Encounter for screening for malignant neoplasm of prostate: Secondary | ICD-10-CM

## 2011-10-20 DIAGNOSIS — R7309 Other abnormal glucose: Secondary | ICD-10-CM

## 2011-10-20 DIAGNOSIS — Z79899 Other long term (current) drug therapy: Secondary | ICD-10-CM

## 2011-10-20 LAB — CBC WITH DIFFERENTIAL/PLATELET
Basophils Absolute: 0 10*3/uL (ref 0.0–0.1)
HCT: 48.9 % (ref 39.0–52.0)
Lymphs Abs: 0.6 10*3/uL — ABNORMAL LOW (ref 0.7–4.0)
MCV: 91 fl (ref 78.0–100.0)
Monocytes Absolute: 0.6 10*3/uL (ref 0.1–1.0)
Monocytes Relative: 10 % (ref 3.0–12.0)
Neutrophils Relative %: 75.6 % (ref 43.0–77.0)
Platelets: 207 10*3/uL (ref 150.0–400.0)
RDW: 14 % (ref 11.5–14.6)
WBC: 6 10*3/uL (ref 4.5–10.5)

## 2011-10-20 LAB — HEPATIC FUNCTION PANEL
AST: 19 U/L (ref 0–37)
Albumin: 4.1 g/dL (ref 3.5–5.2)
Alkaline Phosphatase: 45 U/L (ref 39–117)
Bilirubin, Direct: 0.1 mg/dL (ref 0.0–0.3)
Total Protein: 7.2 g/dL (ref 6.0–8.3)

## 2011-10-20 LAB — URINALYSIS, ROUTINE W REFLEX MICROSCOPIC
Bilirubin Urine: NEGATIVE
Ketones, ur: NEGATIVE
Leukocytes, UA: NEGATIVE
Nitrite: NEGATIVE
Urobilinogen, UA: 0.2 (ref 0.0–1.0)

## 2011-10-20 LAB — LDL CHOLESTEROL, DIRECT: Direct LDL: 200.4 mg/dL

## 2011-10-20 LAB — TSH: TSH: 2.41 u[IU]/mL (ref 0.35–5.50)

## 2011-10-20 LAB — BASIC METABOLIC PANEL
CO2: 30 mEq/L (ref 19–32)
Glucose, Bld: 114 mg/dL — ABNORMAL HIGH (ref 70–99)
Potassium: 4.8 mEq/L (ref 3.5–5.1)
Sodium: 134 mEq/L — ABNORMAL LOW (ref 135–145)

## 2011-10-20 LAB — PROLACTIN: Prolactin: 5.6 ng/mL (ref 2.1–17.1)

## 2011-10-20 NOTE — Patient Instructions (Signed)
Continue all other medications as before Please have the pharmacy call if you need refills Please call your insurance to see if the Shingles shot is covered; if so, please make Nurse Appt to have this shot done You are otherwise up to date with prevention Please go to LAB in the Basement for the blood and/or urine tests to be done today Please call the phone number [phone] (the [facility]) for results of testing in 2-3 days;  When calling, simply dial the number, and when prompted enter the MRN number above (the Medical Record Number) and the # key, then the message should start. Please return in 1 year for your yearly visit, or sooner if needed, with Lab testing done 3-5 days before

## 2011-10-20 NOTE — Progress Notes (Signed)
Subjective:    Patient ID: [name] [name], male    DOB: 1941-05-09, 71 y.o.   MRN: [id number]  HPI  Here for wellness and f/u;  Overall doing ok;  Pt denies CP, worsening SOB, DOE, wheezing, orthopnea, PND, worsening LE edema, palpitations, dizziness or syncope.  Pt denies neurological change such as new Headache, facial or extremity weakness.  Pt denies polydipsia, polyuria, or low sugar symptoms. Pt states overall good compliance with treatment and medications, good tolerability, and trying to follow lower cholesterol diet.  Pt denies worsening depressive symptoms, suicidal ideation or panic. No fever, wt loss, night sweats, loss of appetite, or other constitutional symptoms.  Pt states good ability with ADL's, low fall risk, home safety reviewed and adequate, no significant changes in hearing or vision, and occasionally active with exercise.  Has been taking new fish oil bid since last visit for cholesterol, wants to avoid statins if possible, had myaligas with simvastatin and possbily lipitor in the past. Saw Dr Jeffie Pollock for prostate, and tx with flomax.  Also tx with cymbalta for right groin pain, now improved pain - ? Hip joint pain. Has also lost about 10 lbs with better diet recently.  Has been separated for just over a yr, may be divorcing soon Past Medical History  Diagnosis Date  . Abdominal pain, generalized 08/15/2008  . Abdominal pain, unspecified site 10/16/2009  . Anal fissure 04/24/2008  . BACK PAIN 01/10/2009  . BARRETTS ESOPHAGUS 08/16/2007  . BREAST HYPERTROPHY 02/16/2009  . CARBUNCLE, NECK 02/14/2008  . CHEST PAIN 10/10/2008  . DEPRESSION 10/16/2009  . DIVERTICULOSIS, COLON 06/28/2007  . GERD 06/28/2007  . GLUCOSE INTOLERANCE 08/15/2008  . Headache 11/13/2009  . HERNIA, UMBILICAL W/OBSTRUCTION W/O GANGRENE 06/28/2007  . HIP PAIN, RIGHT 10/16/2009  . HYPERLIPIDEMIA 06/28/2007  . HYPERTENSION 06/28/2007  . INSOMNIA-SLEEP DISORDER-UNSPEC 10/10/2008  . LEG PAIN, BILATERAL 02/16/2009  . LOW BACK  PAIN 06/28/2007  . MASTITIS 01/10/2009  . NECK PAIN 05/31/2008  . NEPHROLITHIASIS, HX OF 06/28/2007  . PROCTOSIGMOIDITIS, ULCERATIVE 06/28/2007  . SHOULDER PAIN, LEFT 10/16/2009  . SINUSITIS- ACUTE-NOS 09/03/2009  . TESTICULAR PAIN, RIGHT 10/25/2010  . ULCERATIVE COLITIS-LEFT SIDE 04/24/2008  . UNIVERSAL ULCERATIVE COLITIS 11/14/2009  . VITAMIN D DEFICIENCY 11/13/2009  . Impaired glucose tolerance 04/13/2011   Past Surgical History  Procedure Date  . Left knee arthroscopy   . Bilateral inguinal hernia with mesh and umbilical hernia repairs 09/2007  . Anal fissure surgury     reports that he has quit smoking. He does not have any smokeless tobacco history on file. He reports that he drinks alcohol. He reports that he does not use illicit drugs. family history includes COPD in his father and mother. Allergies  Allergen Reactions  . Aspirin     REACTION: recurrent BRBPR  . Iohexol      Code: HIVES, Desc: PT STATES HE GET HIVES FROM IV DYE 08/23/08/RM, Onset Date: [id number]   . Simvastatin    Current Outpatient Prescriptions on File Prior to Visit  Medication Sig Dispense Refill  . acetaminophen (TYLENOL) 325 MG tablet Take 325 mg by mouth every 6 (six) hours as needed.        [name] lisinopril-hydrochlorothiazide (PRINZIDE,ZESTORETIC) 20-25 MG per tablet Take 1 tablet by mouth daily.  90 tablet  3  . omeprazole (PRILOSEC) 40 MG capsule Take 40 mg by mouth 2 (two) times daily.        [name] sulfaSALAzine (AZULFIDINE) 500 MG tablet Take 1 tablet (500 mg total)  by mouth 2 (two) times daily.  180 tablet  3  . zolpidem (AMBIEN) 5 MG tablet Take 1 tablet (5 mg total) by mouth at bedtime as needed.  30 tablet  5  . gabapentin (NEURONTIN) 300 MG capsule Take 1 capsule (300 mg total) by mouth 3 (three) times daily.  30 capsule  2   Review of Systems Review of Systems  Constitutional: Negative for diaphoresis, activity change, appetite change and unexpected weight change.  HENT: Negative for hearing loss, ear  pain, facial swelling, mouth sores and neck stiffness.   Eyes: Negative for pain, redness and visual disturbance.  Respiratory: Negative for shortness of breath and wheezing.   Cardiovascular: Negative for chest pain and palpitations.  Gastrointestinal: Negative for diarrhea, blood in stool, abdominal distention and rectal pain.  Genitourinary: Negative for hematuria, flank pain and decreased urine volume.  Musculoskeletal: Negative for myalgias and joint swelling.  Skin: Negative for color change and wound.  Neurological: Negative for syncope and numbness.  Hematological: Negative for adenopathy.  Psychiatric/Behavioral: Negative for hallucinations, self-injury, decreased concentration and agitation.      Objective:   Physical Exam BP 108/80  Pulse 77  Temp(Src) 97.3 F (36.3 C) (Oral)  Ht 5' 8"  (1.727 m)  Wt 236 lb 6 oz (107.219 kg)  BMI 35.94 kg/m2  SpO2 96% Physical Exam  VS noted Constitutional: Pt is oriented to person, place, and time. Appears well-developed and well-nourished.  HENT:  Head: Normocephalic and atraumatic.  Right Ear: External ear normal.  Left Ear: External ear normal.  Nose: Nose normal.  Mouth/Throat: Oropharynx is clear and moist.  Eyes: Conjunctivae and EOM are normal. Pupils are equal, round, and reactive to light.  Neck: Normal range of motion. Neck supple. No JVD present. No tracheal deviation present.  Cardiovascular: Normal rate, regular rhythm, normal heart sounds and intact distal pulses.   Pulmonary/Chest: Effort normal and breath sounds normal.  Abdominal: Soft. Bowel sounds are normal. There is no tenderness.  Musculoskeletal: Normal range of motion. Exhibits no edema.  Lymphadenopathy:  Has no cervical adenopathy.  Neurological: Pt is alert and oriented to person, place, and time. Pt has normal reflexes. No cranial nerve deficit.  Skin: Skin is warm and dry. No rash noted.  Psychiatric:  Has  normal mood and affect. Behavior is normal.      Assessment & Plan:

## 2011-10-21 LAB — TESTOSTERONE, FREE, TOTAL, SHBG
Testosterone, Free: 78.1 pg/mL (ref 47.0–244.0)
Testosterone-% Free: 1.7 % (ref 1.6–2.9)

## 2011-10-22 LAB — LUTEINIZING HORMONE: LH: 7.71 m[IU]/mL (ref 3.10–34.60)

## 2011-10-24 ENCOUNTER — Other Ambulatory Visit: Payer: Self-pay

## 2011-10-24 ENCOUNTER — Other Ambulatory Visit: Payer: Self-pay | Admitting: Internal Medicine

## 2011-10-24 DIAGNOSIS — E785 Hyperlipidemia, unspecified: Secondary | ICD-10-CM

## 2011-10-24 DIAGNOSIS — R972 Elevated prostate specific antigen [PSA]: Secondary | ICD-10-CM

## 2011-10-24 DIAGNOSIS — Z79899 Other long term (current) drug therapy: Secondary | ICD-10-CM

## 2011-10-24 MED ORDER — ATORVASTATIN CALCIUM 20 MG PO TABS: 20.0000 mg | ORAL_TABLET | Freq: Every day | ORAL | Status: DC

## 2011-10-24 MED ORDER — OMEPRAZOLE 40 MG PO CPDR: 40.0000 mg | DELAYED_RELEASE_CAPSULE | Freq: Two times a day (BID) | ORAL | Status: DC

## 2011-11-11 ENCOUNTER — Encounter: Payer: Self-pay | Admitting: Gastroenterology

## 2011-11-25 ENCOUNTER — Other Ambulatory Visit (INDEPENDENT_AMBULATORY_CARE_PROVIDER_SITE_OTHER): Payer: Medicare Other

## 2011-11-25 ENCOUNTER — Encounter: Payer: Self-pay | Admitting: Internal Medicine

## 2011-11-25 DIAGNOSIS — E785 Hyperlipidemia, unspecified: Secondary | ICD-10-CM

## 2011-11-25 DIAGNOSIS — Z79899 Other long term (current) drug therapy: Secondary | ICD-10-CM

## 2011-11-25 LAB — HEPATIC FUNCTION PANEL
ALT: 23 U/L (ref 0–53)
AST: 38 U/L — ABNORMAL HIGH (ref 0–37)
Alkaline Phosphatase: 41 U/L (ref 39–117)
Bilirubin, Direct: 0.1 mg/dL (ref 0.0–0.3)
Total Bilirubin: 0.7 mg/dL (ref 0.3–1.2)
Total Protein: 6.7 g/dL (ref 6.0–8.3)

## 2011-11-25 LAB — LIPID PANEL
Cholesterol: 131 mg/dL (ref 0–200)
VLDL: 19.6 mg/dL (ref 0.0–40.0)

## 2011-12-15 ENCOUNTER — Other Ambulatory Visit: Payer: Self-pay | Admitting: Internal Medicine

## 2012-01-22 ENCOUNTER — Other Ambulatory Visit: Payer: Self-pay | Admitting: Gastroenterology

## 2012-01-27 ENCOUNTER — Telehealth: Payer: Self-pay | Admitting: Gastroenterology

## 2012-01-27 MED ORDER — SULFASALAZINE 500 MG PO TABS: 500.0000 mg | ORAL_TABLET | Freq: Two times a day (BID) | ORAL | Status: DC

## 2012-01-27 NOTE — Telephone Encounter (Signed)
Informed patient that he is over due for recall office visit and needs to schedule a office visit. Pt agreed and also states he is over due for his recall Colonoscopy. I checked his recalls and confirmed he is over due. Pt is coming for an appt on 02/09/12 and will discuss recall Colonoscopy and med refills then.

## 2012-02-09 ENCOUNTER — Encounter: Payer: Self-pay | Admitting: Gastroenterology

## 2012-02-09 ENCOUNTER — Other Ambulatory Visit (INDEPENDENT_AMBULATORY_CARE_PROVIDER_SITE_OTHER): Payer: Medicare Other

## 2012-02-09 ENCOUNTER — Encounter: Payer: Self-pay | Admitting: Internal Medicine

## 2012-02-09 ENCOUNTER — Ambulatory Visit (INDEPENDENT_AMBULATORY_CARE_PROVIDER_SITE_OTHER): Payer: Medicare Other | Admitting: Gastroenterology

## 2012-02-09 VITALS — BP 128/58 | HR 88 | Ht 68.0 in | Wt 243.1 lb

## 2012-02-09 DIAGNOSIS — K5150 Left sided colitis without complications: Secondary | ICD-10-CM

## 2012-02-09 DIAGNOSIS — K2270 Barrett's esophagus without dysplasia: Secondary | ICD-10-CM

## 2012-02-09 LAB — BASIC METABOLIC PANEL
Chloride: 103 mEq/L (ref 96–112)
Creatinine, Ser: 1 mg/dL (ref 0.4–1.5)
Potassium: 4.1 mEq/L (ref 3.5–5.1)

## 2012-02-09 LAB — CBC WITH DIFFERENTIAL/PLATELET
Basophils Absolute: 0 10*3/uL (ref 0.0–0.1)
Eosinophils Absolute: 0.3 10*3/uL (ref 0.0–0.7)
Lymphocytes Relative: 11.1 % — ABNORMAL LOW (ref 12.0–46.0)
MCHC: 34 g/dL (ref 30.0–36.0)
Neutro Abs: 7 10*3/uL (ref 1.4–7.7)
Neutrophils Relative %: 77.1 % — ABNORMAL HIGH (ref 43.0–77.0)
RDW: 14 % (ref 11.5–14.6)

## 2012-02-09 MED ORDER — PEG-KCL-NACL-NASULF-NA ASC-C 100 G PO SOLR: 1.0000 | Freq: Once | ORAL | Status: DC

## 2012-02-09 MED ORDER — SULFASALAZINE 500 MG PO TABS: ORAL_TABLET | ORAL | Status: DC

## 2012-02-09 NOTE — Patient Instructions (Addendum)
You have been given a separate informational sheet regarding your tobacco use, the importance of quitting and local resources to help you quit.  You have been scheduled for a colonoscopy with propofol. Please follow written instructions given to you at your visit today.  Please pick up your prep kit at the pharmacy within the next 1-3 days.   Your physician has requested that you go to the basement for the following lab work before leaving today: CBC. BMET  We have sent the following medications to your pharmacy for you to pick up at your convenience: Sulfasalzine, take 2 tabs twice daily

## 2012-02-09 NOTE — Progress Notes (Signed)
History of Present Illness: This is a 71 year old male with left-sided ulcerative colitis and Barrett's esophagus. Reflux symptoms are under excellent control. He states he has episodes of looser stools with small amounts of bleeding about every 2-3 weeks. He did not return for recommended followup in 6 months. He is currently taking sulfasalazine 500 mg twice daily.  Current Medications, Allergies, Past Medical History, Past Surgical History, Family History and Social History were reviewed in [employer] record.  Physical Exam: General: Well developed , well nourished, no acute distress Head: Normocephalic and atraumatic Eyes:  sclerae anicteric, EOMI Ears: Normal auditory acuity Mouth: No deformity or lesions Lungs: Clear throughout to auscultation Heart: Regular rate and rhythm; no murmurs, rubs or bruits Abdomen: Soft, non tender and non distended. No masses, hepatosplenomegaly or hernias noted. Normal Bowel sounds Musculoskeletal: Symmetrical with no gross deformities  Pulses:  Normal pulses noted Extremities: No clubbing, cyanosis, edema or deformities noted Neurological: Alert oriented x 4, grossly nonfocal Psychological:  Alert and cooperative. Normal mood and affect  Assessment and Recommendations:  1. Ulcerative colits. Frequent symptomatic activity. Increase sulfasalazine to 1 g twice a day. Obtain  BMET and CBC today. He is due for surveillance colonoscopy. The risks, benefits, and alternatives to colonoscopy with possible biopsy and possible polypectomy were discussed with the patient and they consent to proceed.   2. Barrett's esophagus. Continue omeprazole 40 mg daily and standard antireflux measures. Surveillance endoscopy February 2015.

## 2012-03-05 ENCOUNTER — Ambulatory Visit (AMBULATORY_SURGERY_CENTER): Payer: Medicare Other | Admitting: Gastroenterology

## 2012-03-05 ENCOUNTER — Encounter: Payer: Self-pay | Admitting: Gastroenterology

## 2012-03-05 VITALS — BP 121/59 | HR 86 | Temp 95.7°F | Resp 15 | Ht 68.0 in | Wt 243.0 lb

## 2012-03-05 DIAGNOSIS — K2270 Barrett's esophagus without dysplasia: Secondary | ICD-10-CM

## 2012-03-05 DIAGNOSIS — K5289 Other specified noninfective gastroenteritis and colitis: Secondary | ICD-10-CM

## 2012-03-05 DIAGNOSIS — D126 Benign neoplasm of colon, unspecified: Secondary | ICD-10-CM

## 2012-03-05 DIAGNOSIS — Z1211 Encounter for screening for malignant neoplasm of colon: Secondary | ICD-10-CM

## 2012-03-05 DIAGNOSIS — K5150 Left sided colitis without complications: Secondary | ICD-10-CM

## 2012-03-05 MED ORDER — SODIUM CHLORIDE 0.9 % IV SOLN: 500.0000 mL | INTRAVENOUS | Status: DC

## 2012-03-05 MED ORDER — SULFASALAZINE 500 MG PO TABS: ORAL_TABLET | ORAL | Status: DC

## 2012-03-05 NOTE — Op Note (Signed)
[facility] [address] [facility] [address] [place], [place]  [id number]  COLONOSCOPY PROCEDURE REPORT PATIENT:  [name] [name], [name] [name]  MR#:  [id number] BIRTHDATE:  05-26-1941, 75 yrs. old  GENDER:  male ENDOSCOPIST:  Norberto Sorenson T. Fuller Plan, MD, [facility]  PROCEDURE DATE:  03/05/2012 PROCEDURE:  Colonoscopy with biopsy and snare polypectomy ASA CLASS:  Class II INDICATIONS:  1) surveillance and high-risk screening  2) evaluation of ulcerative colitis MEDICATIONS:   MAC sedation, administered by CRNA, propofol (Diprivan) 260 mg IV DESCRIPTION OF PROCEDURE:   After the risks benefits and alternatives of the procedure were thoroughly explained, informed consent was obtained.  Digital rectal exam was performed and revealed no abnormalities.   The LB CF-[id number] endoscope was introduced through the anus and advanced to the cecum, which was identified by both the appendix and ileocecal valve, limited by fair prep.    The quality of the prep was Moviprep fair.  The instrument was then slowly withdrawn as the colon was fully examined. <<PROCEDUREIMAGES>> FINDINGS:  A sessile polyp was found in the cecum. It was 4 mm in size. The polyp was removed using cold biopsy forceps.  A sessile polyp was found in the ascending colon. It was 7 mm in size. Polyp was snared without cautery. Retrieval was successful. Mild diverticulosis was found in the sigmoid colon.  Colitis was found in the sigmoid colon. It was granular and erythematous. Random biopsies were obtained and sent to pathology.  Colitis was found in the rectum. It was erythematous and granular. Random biopsies were obtained and sent to pathology.  Otherwise normal colonoscopy without other polyps, masses, vascular ectasias, or inflammatory changes. Random biopsies were obtained and sent to pathology. Retroflexed views in the rectum revealed internal hemorrhoids, small.  The time to cecum =  2  minutes. The scope was then withdrawn (time =  12.75  min) from  the patient and the procedure completed. COMPLICATIONS:  None  ENDOSCOPIC IMPRESSION: 1) 4 mm sessile polyp in the cecum 2) 7 mm sessile polyp in the ascending colon 3) Mild diverticulosis in the sigmoid colon 4) Colitis in the rectum and sigmoid colon  RECOMMENDATIONS: 1) Await pathology results 2) High fiber diet with liberal fluid intake. 3) Repeat Colonoscopy in 2 years with a more extensive bowel prep.  [name]. Fuller Plan, MD, [facility]  n. eSIGNED:   [name]. Karlye Ihrig at 03/05/2012 10:41 AM  [name], [phone]

## 2012-03-05 NOTE — Patient Instructions (Addendum)

## 2012-03-05 NOTE — Progress Notes (Signed)
Patient did not experience any of the following events: a burn prior to discharge; a fall within the facility; wrong site/side/patient/procedure/implant event; or a hospital transfer or hospital admission upon discharge from the facility. (G8907) Patient did not have preoperative order for IV antibiotic SSI prophylaxis. (G8918)  

## 2012-03-08 ENCOUNTER — Telehealth: Payer: Self-pay

## 2012-03-08 NOTE — Telephone Encounter (Signed)
  Follow up Call-  Call back number 03/05/2012  Post procedure Call Back phone  # [phone]  Permission to leave phone message Yes     Patient questions:  Do you have a fever, pain , or abdominal swelling? no Pain Score  0 *  Have you tolerated food without any problems? yes  Have you been able to return to your normal activities? yes  Do you have any questions about your discharge instructions: Diet   no Medications  no Follow up visit  no  Do you have questions or concerns about your Care? no  Actions: * If pain score is 4 or above: No action needed, pain <4.

## 2012-03-14 ENCOUNTER — Encounter: Payer: Self-pay | Admitting: Gastroenterology

## 2012-03-24 ENCOUNTER — Other Ambulatory Visit: Payer: Self-pay

## 2012-03-24 MED ORDER — ZOLPIDEM TARTRATE 5 MG PO TABS: 5.0000 mg | ORAL_TABLET | Freq: Every evening | ORAL | Status: DC | PRN

## 2012-03-24 NOTE — Telephone Encounter (Signed)
Done hardcopy to robin  

## 2012-03-24 NOTE — Telephone Encounter (Signed)
Faxed hardcopy to pharmacy. 

## 2012-03-29 ENCOUNTER — Other Ambulatory Visit: Payer: Self-pay | Admitting: Gastroenterology

## 2012-04-09 ENCOUNTER — Other Ambulatory Visit: Payer: Self-pay | Admitting: Dermatology

## 2012-04-12 ENCOUNTER — Other Ambulatory Visit: Payer: Self-pay | Admitting: Gastroenterology

## 2012-04-12 ENCOUNTER — Other Ambulatory Visit: Payer: Self-pay

## 2012-04-12 DIAGNOSIS — K5150 Left sided colitis without complications: Secondary | ICD-10-CM

## 2012-04-12 DIAGNOSIS — K2270 Barrett's esophagus without dysplasia: Secondary | ICD-10-CM

## 2012-04-12 MED ORDER — SULFASALAZINE 500 MG PO TABS: ORAL_TABLET | ORAL | Status: DC

## 2012-04-12 MED ORDER — ATORVASTATIN CALCIUM 20 MG PO TABS: 20.0000 mg | ORAL_TABLET | Freq: Every day | ORAL | Status: DC

## 2012-04-12 MED ORDER — OMEPRAZOLE 40 MG PO CPDR: 40.0000 mg | DELAYED_RELEASE_CAPSULE | Freq: Two times a day (BID) | ORAL | Status: DC

## 2012-04-12 MED ORDER — LISINOPRIL-HYDROCHLOROTHIAZIDE 20-25 MG PO TABS: 1.0000 | ORAL_TABLET | Freq: Every day | ORAL | Status: DC

## 2012-04-12 NOTE — Telephone Encounter (Signed)
Prescription sent to patient's mail order pharmacy [facility]. Pt notified.

## 2012-04-12 NOTE — Telephone Encounter (Signed)
Left a message for patient to return my call. 

## 2012-04-18 ENCOUNTER — Other Ambulatory Visit: Payer: Self-pay | Admitting: Internal Medicine

## 2012-07-08 ENCOUNTER — Ambulatory Visit
Admission: RE | Admit: 2012-07-08 | Discharge: 2012-07-08 | Disposition: A | Discharge status: Home / Self Care | Payer: Medicare Other | Source: Ambulatory Visit | Attending: Cardiovascular Disease | Admitting: Cardiovascular Disease

## 2012-07-08 ENCOUNTER — Other Ambulatory Visit: Payer: Self-pay | Admitting: Cardiovascular Disease

## 2012-07-08 DIAGNOSIS — I270 Primary pulmonary hypertension: Secondary | ICD-10-CM

## 2012-07-08 DIAGNOSIS — I10 Essential (primary) hypertension: Secondary | ICD-10-CM

## 2012-07-09 ENCOUNTER — Other Ambulatory Visit: Payer: Self-pay | Admitting: Cardiovascular Disease

## 2012-07-12 ENCOUNTER — Encounter (HOSPITAL_COMMUNITY): Payer: Self-pay | Admitting: Cardiology

## 2012-07-12 ENCOUNTER — Inpatient Hospital Stay (HOSPITAL_COMMUNITY)
Admission: RE | Admit: 2012-07-12 | Discharge: 2012-07-13 | DRG: 253 | Disposition: A | Discharge status: Home / Self Care | Payer: Medicare Other | Source: Ambulatory Visit | Attending: Cardiovascular Disease | Admitting: Cardiovascular Disease

## 2012-07-12 ENCOUNTER — Encounter (HOSPITAL_COMMUNITY)
Admission: RE | Disposition: A | Discharge status: Home / Self Care | Payer: Self-pay | Source: Ambulatory Visit | Attending: Cardiovascular Disease

## 2012-07-12 DIAGNOSIS — E663 Overweight: Secondary | ICD-10-CM | POA: Diagnosis present

## 2012-07-12 DIAGNOSIS — Z95820 Peripheral vascular angioplasty status with implants and grafts: Secondary | ICD-10-CM

## 2012-07-12 DIAGNOSIS — I82819 Embolism and thrombosis of superficial veins of unspecified lower extremities: Secondary | ICD-10-CM | POA: Diagnosis present

## 2012-07-12 DIAGNOSIS — I10 Essential (primary) hypertension: Secondary | ICD-10-CM | POA: Diagnosis present

## 2012-07-12 DIAGNOSIS — I749 Embolism and thrombosis of unspecified artery: Secondary | ICD-10-CM | POA: Diagnosis not present

## 2012-07-12 DIAGNOSIS — I739 Peripheral vascular disease, unspecified: Secondary | ICD-10-CM | POA: Diagnosis present

## 2012-07-12 DIAGNOSIS — Z8719 Personal history of other diseases of the digestive system: Secondary | ICD-10-CM

## 2012-07-12 DIAGNOSIS — Z95828 Presence of other vascular implants and grafts: Secondary | ICD-10-CM

## 2012-07-12 DIAGNOSIS — I70219 Atherosclerosis of native arteries of extremities with intermittent claudication, unspecified extremity: Principal | ICD-10-CM | POA: Diagnosis present

## 2012-07-12 HISTORY — DX: Peripheral vascular angioplasty status with implants and grafts: Z95.820

## 2012-07-12 HISTORY — DX: Essential (primary) hypertension: I10

## 2012-07-12 HISTORY — DX: Peripheral vascular disease, unspecified: I73.9

## 2012-07-12 HISTORY — PX: ATHERECTOMY: SHX5502

## 2012-07-12 LAB — POCT ACTIVATED CLOTTING TIME
Activated Clotting Time: 224 seconds
Activated Clotting Time: 164 seconds
Activated Clotting Time: 139 seconds

## 2012-07-12 SURGERY — ATHERECTOMY
Anesthesia: LOCAL | Laterality: Right

## 2012-07-12 MED ORDER — LISINOPRIL 20 MG PO TABS
20.0000 mg | ORAL_TABLET | Freq: Every day | ORAL | Status: DC
  Administered 2012-07-13: 20 mg via ORAL
  Filled 2012-07-12: qty 1

## 2012-07-12 MED ORDER — ZOLPIDEM TARTRATE 5 MG PO TABS
5.0000 mg | ORAL_TABLET | Freq: Every day | ORAL | Status: DC
  Administered 2012-07-12: 5 mg via ORAL
  Filled 2012-07-12: qty 1

## 2012-07-12 MED ORDER — DIPHENHYDRAMINE HCL 50 MG/ML IJ SOLN
25.0000 mg | INTRAMUSCULAR | Status: AC
  Administered 2012-07-12: 25 mg via INTRAVENOUS
  Filled 2012-07-12: qty 1

## 2012-07-12 MED ORDER — EPTIFIBATIDE 75 MG/100ML IV SOLN
INTRAVENOUS | Status: AC
  Filled 2012-07-12: qty 100

## 2012-07-12 MED ORDER — ASPIRIN 81 MG PO CHEW
CHEWABLE_TABLET | ORAL | Status: AC
  Filled 2012-07-12: qty 4

## 2012-07-12 MED ORDER — ATROPINE SULFATE 1 MG/ML IJ SOLN
INTRAMUSCULAR | Status: AC
  Filled 2012-07-12: qty 1

## 2012-07-12 MED ORDER — ASPIRIN EC 325 MG PO TBEC
325.0000 mg | DELAYED_RELEASE_TABLET | Freq: Every day | ORAL | Status: DC
  Administered 2012-07-13: 325 mg via ORAL
  Filled 2012-07-12: qty 1

## 2012-07-12 MED ORDER — FAMOTIDINE IN NACL 20-0.9 MG/50ML-% IV SOLN
20.0000 mg | INTRAVENOUS | Status: AC
  Administered 2012-07-12: 20 mg via INTRAVENOUS
  Filled 2012-07-12 (×2): qty 50

## 2012-07-12 MED ORDER — EPTIFIBATIDE 75 MG/100ML IV SOLN
2.0000 ug/kg/min | INTRAVENOUS | Status: DC
  Administered 2012-07-12 – 2012-07-13 (×4): 2 ug/kg/min via INTRAVENOUS
  Filled 2012-07-12 (×8): qty 100

## 2012-07-12 MED ORDER — HEPARIN SODIUM (PORCINE) 1000 UNIT/ML IJ SOLN
INTRAMUSCULAR | Status: AC
  Filled 2012-07-12: qty 1

## 2012-07-12 MED ORDER — SODIUM CHLORIDE 0.9 % IV SOLN
INTRAVENOUS | Status: DC
  Administered 2012-07-12: 75 mL/h via INTRAVENOUS

## 2012-07-12 MED ORDER — ASPIRIN EC 325 MG PO TBEC
325.0000 mg | DELAYED_RELEASE_TABLET | Freq: Every day | ORAL | Status: DC
  Filled 2012-07-12: qty 1

## 2012-07-12 MED ORDER — LIDOCAINE HCL (PF) 1 % IJ SOLN
INTRAMUSCULAR | Status: AC
  Filled 2012-07-12: qty 30

## 2012-07-12 MED ORDER — DIAZEPAM 5 MG PO TABS
5.0000 mg | ORAL_TABLET | ORAL | Status: AC
Start: 2012-07-12 — End: 2012-07-12
  Administered 2012-07-12: 5 mg via ORAL
  Filled 2012-07-12: qty 1

## 2012-07-12 MED ORDER — FENTANYL CITRATE 0.05 MG/ML IJ SOLN
INTRAMUSCULAR | Status: AC
  Filled 2012-07-12: qty 2

## 2012-07-12 MED ORDER — ACETAMINOPHEN 325 MG PO TABS: 650.0000 mg | ORAL_TABLET | ORAL | Status: DC | PRN

## 2012-07-12 MED ORDER — TENECTEPLASE 50 MG IV KIT
PACK | INTRAVENOUS | Status: DC
  Filled 2012-07-12 (×4): qty 100

## 2012-07-12 MED ORDER — HEPARIN (PORCINE) IN NACL 2-0.9 UNIT/ML-% IJ SOLN
INTRAMUSCULAR | Status: AC
  Filled 2012-07-12: qty 1000

## 2012-07-12 MED ORDER — CLOPIDOGREL BISULFATE 300 MG PO TABS
ORAL_TABLET | ORAL | Status: AC
  Filled 2012-07-12: qty 2

## 2012-07-12 MED ORDER — ATORVASTATIN CALCIUM 20 MG PO TABS
20.0000 mg | ORAL_TABLET | Freq: Every day | ORAL | Status: DC
  Administered 2012-07-13: 20 mg via ORAL
  Filled 2012-07-12: qty 1

## 2012-07-12 MED ORDER — EPTIFIBATIDE 75 MG/100ML IV SOLN
2.0000 ug/kg/min | INTRAVENOUS | Status: DC
  Filled 2012-07-12: qty 100

## 2012-07-12 MED ORDER — METHYLPREDNISOLONE SODIUM SUCC 125 MG IJ SOLR
60.0000 mg | INTRAMUSCULAR | Status: AC
  Administered 2012-07-12: 60 mg via INTRAVENOUS
  Filled 2012-07-12: qty 2

## 2012-07-12 MED ORDER — SODIUM CHLORIDE 0.9 % IV SOLN: INTRAVENOUS | Status: AC

## 2012-07-12 MED ORDER — ONDANSETRON HCL 4 MG/2ML IJ SOLN: 4.0000 mg | Freq: Four times a day (QID) | INTRAMUSCULAR | Status: DC | PRN

## 2012-07-12 MED ORDER — CLOPIDOGREL BISULFATE 75 MG PO TABS
75.0000 mg | ORAL_TABLET | Freq: Every day | ORAL | Status: DC
  Administered 2012-07-13: 75 mg via ORAL
  Filled 2012-07-12: qty 1

## 2012-07-12 MED ORDER — LISINOPRIL-HYDROCHLOROTHIAZIDE 20-25 MG PO TABS: 1.0000 | ORAL_TABLET | Freq: Every day | ORAL | Status: DC

## 2012-07-12 MED ORDER — NITROGLYCERIN 0.2 MG/ML ON CALL CATH LAB
INTRAVENOUS | Status: AC
  Filled 2012-07-12: qty 1

## 2012-07-12 MED ORDER — HEPARIN (PORCINE) IN NACL 2-0.9 UNIT/ML-% IJ SOLN
INTRAMUSCULAR | Status: AC
  Filled 2012-07-12: qty 500

## 2012-07-12 MED ORDER — SODIUM CHLORIDE 0.9 % IJ SOLN: 3.0000 mL | INTRAMUSCULAR | Status: DC | PRN

## 2012-07-12 MED ORDER — HYDROCHLOROTHIAZIDE 25 MG PO TABS
25.0000 mg | ORAL_TABLET | Freq: Every day | ORAL | Status: DC
  Administered 2012-07-13: 25 mg via ORAL
  Filled 2012-07-12: qty 1

## 2012-07-12 MED ORDER — PANTOPRAZOLE SODIUM 40 MG PO TBEC
40.0000 mg | DELAYED_RELEASE_TABLET | Freq: Every day | ORAL | Status: DC
  Administered 2012-07-13: 40 mg via ORAL
  Filled 2012-07-12: qty 1

## 2012-07-12 MED ORDER — SULFASALAZINE 500 MG PO TABS
1000.0000 mg | ORAL_TABLET | Freq: Two times a day (BID) | ORAL | Status: DC
  Administered 2012-07-13: 1000 mg via ORAL
  Filled 2012-07-12 (×2): qty 2

## 2012-07-12 MED ORDER — FAMOTIDINE IN NACL 20-0.9 MG/50ML-% IV SOLN
INTRAVENOUS | Status: AC
  Filled 2012-07-12: qty 50

## 2012-07-12 NOTE — Progress Notes (Signed)
Pt had left femoral sheath at a level 2. Removed sheath from left femoral artery at 2010. Held pressure for 20 minutes with no complications. Expressed hematoma. Now level 1. Pressure dressing applied. Instructions given to patient by teach back.

## 2012-07-12 NOTE — Op Note (Signed)
[name] [name] is a 71 y.o. male    [id number] LOCATION:  FACILITY: [facility]  PHYSICIAN: Quay Burow, M.D. 1940-11-07   DATE OF PROCEDURE:  07/12/2012  DATE OF DISCHARGE:  [place]  PV Intervention     History obtained from chart review. [name] [name] is a 71 year old moderately overweight divorced Caucasian male father of 3 he was referred to me by [name] [name] at [facility] for  peripheral vascular evaluation because of lifestyle limiting claudication. He is a retired Administrator and has a factors include remote tobacco use having quit 35 years ago however he did smoke one to 3 packs per day for 20 years. History of hypertension and dyslipidemia. He complains of nocturnal pain in his hips and thighs which sound more like "restless leg syndrome and/or spinal stenosis". She also has bilateral claudication with a right ABI of 0.67 the left of 0.73 with what appears to be high-grade external iliac and SFA disease on the right. It has now for angiography and potential percutaneous intervention or lifestyle limiting claudication.   PROCEDURE DESCRIPTION:    The patient was brought to the second floor  [place] [facility] cath lab in the postabsorptive state. He was  premedicated with Valium 5 mg by mouth, IV fentanyl, IV Solu-Medrol, Pepcid, and Benadryl for contrast allergy prophylaxis. His left groin was prepped and shaved in usual sterile fashion. Xylocaine 1% was used for local anesthesia. A 5 French sheath was inserted into the left common femoral artery using standard Seldinger technique. The patient received 11,500 units  of heparin  intravenously.  A total of 359 cc was administered to the patient. Visipaque allergies for the entirety of the case. Retrograde aortic pressure was monitored during the case.    HEMODYNAMICS:    AO SYSTOLIC/AO DIASTOLIC: 300/92   ANGIOGRAPHIC RESULTS:   1: Abdominal aortogram-renal arteries were widely patent,  the infrarenal abdominal aorta had mild atherosclerotic changes.  2: Left lower extremity-80% focal calcific mid left SFA stenosis with three-vessel runoff  3: Right lower extremity- there was an 80% calcific, eccentric plaque in the right external iliac artery. There was an 80% segmental mid right SFA stenosis as well as an 80% focal calcified stenosis just distal to this with three-vessel runoff  IMPRESSION:[name] [name] has high-grade calcified right external iliac artery and SFA stenosis. Will proceed with diamondback orbital rotational atherectomy, PTA and stenting of those vessels using distal protection.  Procedure description: Contralateral access was obtained with a 5 [place] crossover catheter and a 7 [place] destination sheath. I was able to get a fiber wire across the extrailiac lesion for a 5 French endhole catheter. The patient did receive 4 baby aspirin prior to intervention. Using a 2.0 mm bur multiple passes were performed on the right extrailiac artery a 120,000 RPMs. Stenting was then performed with a 10 x 30 mm long Abbott  Absolut Pro Nitinol self-expanding stent. Following this, I was able to cross the SFA using a [place].wire and across the 18 endhole catheter and exchanged for a viper wire in the above-the-knee popliteal artery. I then placed a NAV 6 filter in the above-the-knee popliteal filter for distal protection. I was then able to perform diamondback atherectomy of the mid SFA using the same 2.0 mm bur up 120,000 rpm's. Following this I dilated with a 4 mm x 6 cm 014 rapid exchange balloon and stented with a 6 mm x 18 mm long Abbott absolute pro Nitinol self-expanding stent. There  appeared to be either a filling defect or a dissection just distal to the stented segment and I therefore elected to place an additional stent (6 mm x 54m). There appeared to be a filling defect consistent with thrombus. There also appeared to be no flow beyond the NAV 6 filter I then used a Pronto before  aspiration catheter and aspirated the entire segment from the filter back to the stent removing the majority of the thrombus. I then captured the filter and performed angiography below the knee revealing thrombus at the peroneal/posterior tibial bifurcation. I rewired this with the Rigali wire and took the Pronto before it down to the perineal performing aspiration thrombectomy restoring antegrade flow. I removed the Pronto catheter and replace this with an end hole quick cross and hold and placed it just above the anterior tibial artery. Because of what appeared to be white thrombus in the aspirate I elected to give double bolus Integrilin down the quick cross catheter he is slow push. I then performed caliche angiography revealing brisk antegrade flow down to the foot.  Overall impression: successful diamondback orbital rotational atherectomy, PTA and stenting of the right external iliac artery and right SFA using distal protection. The patient required at an excessive amount of heparin and appear to be prothrombotic. He required aspiration thrombectomy and administration of Integrilin 2B3A inhibitor for restoration of flow. I did give him 600 mg of by mouth Plavix at the end of the case, with true the destination sheath across the bifurcation and exchanged this for a 7 French short sheath. This was then sewn securely in place and the patient left the Cath Lab in stable condition. The sheath will be removed once the ACT falls below 170 and I will continue Integrilin for 18 hours.  [name] HarpMD, FParis Regional Medical Center - South Campus11/12/2011 3:59 PM

## 2012-07-12 NOTE — H&P (Signed)
  H & P will be scanned in.  Pt was reexamined and existing H & P reviewed. No changes found.  [name], MD [facility] 07/12/2012 1:06 PM

## 2012-07-13 LAB — BASIC METABOLIC PANEL
Chloride: 101 mEq/L (ref 96–112)
GFR calc Af Amer: 80 mL/min — ABNORMAL LOW (ref >90)
GFR calc non Af Amer: 69 mL/min — ABNORMAL LOW (ref >90)
Glucose, Bld: 114 mg/dL — ABNORMAL HIGH (ref 70–99)
Potassium: 3.3 mEq/L — ABNORMAL LOW (ref 3.5–5.1)
Sodium: 136 mEq/L (ref 135–145)

## 2012-07-13 LAB — CBC
Hemoglobin: 12.9 g/dL — ABNORMAL LOW (ref 13.0–17.0)
MCHC: 33.8 g/dL (ref 30.0–36.0)
WBC: 11.8 10*3/uL — ABNORMAL HIGH (ref 4.0–10.5)

## 2012-07-13 LAB — POCT ACTIVATED CLOTTING TIME
Activated Clotting Time: 224 seconds
Activated Clotting Time: 239 seconds

## 2012-07-13 MED ORDER — CLOPIDOGREL BISULFATE 75 MG PO TABS: 75.0000 mg | ORAL_TABLET | Freq: Every day | ORAL | Status: DC

## 2012-07-13 MED ORDER — ASPIRIN 81 MG PO TBEC
81.0000 mg | DELAYED_RELEASE_TABLET | Freq: Every day | ORAL | Status: DC
Start: 2012-07-13 — End: 2016-11-06

## 2012-07-13 MED ORDER — PANTOPRAZOLE SODIUM 40 MG PO TBEC
40.0000 mg | DELAYED_RELEASE_TABLET | Freq: Every day | ORAL | Status: DC
Start: 2012-07-13 — End: 2012-11-08

## 2012-07-13 MED ORDER — POTASSIUM CHLORIDE CRYS ER 20 MEQ PO TBCR
40.0000 meq | EXTENDED_RELEASE_TABLET | Freq: Once | ORAL | Status: AC
  Administered 2012-07-13: 40 meq via ORAL
  Filled 2012-07-13: qty 2

## 2012-07-13 NOTE — Progress Notes (Signed)
Discharged home accompanied by daughter, stable, belongings with pt. Discharge instructions given to pt.

## 2012-07-13 NOTE — Progress Notes (Signed)
Subjective:  Right LE/ foot feels better this AM  Objective:  Temp:  [96.9 F (36.1 C)-98.9 F (37.2 C)] 98.9 F (37.2 C) (11/05 0740) Pulse Rate:  [63-88] 78  (11/05 0740) Resp:  [13-20] 13  (11/05 0740) BP: (93-145)/(53-106) 128/100 mmHg (11/05 0740) SpO2:  [93 %-99 %] 97 % (11/05 0740) Weight:  [108.863 kg (240 lb)-111.7 kg (246 lb 4.1 oz)] 111.7 kg (246 lb 4.1 oz) (11/04 1630) Weight change:   Intake/Output from previous day: 11/04 0701 - 11/05 0700 In: 636.6 [I.V.:636.6] Out: 1700 [Urine:1700]  Intake/Output from this shift:    Physical Exam: General appearance: alert, cooperative and no distress Neck: no adenopathy, no carotid bruit, no JVD, supple, symmetrical, trachea midline and thyroid not enlarged, symmetric, no tenderness/mass/nodules Lungs: clear to auscultation bilaterally Heart: regular rate and rhythm, S1, S2 normal, no murmur, click, rub or gallop Extremities: extremities normal, atraumatic, no cyanosis or edema and moderate ecchymosis left groin w/o hematome Pulses: 2+ and symmetric doppleranle Right PT  Lab Results: Results for orders placed during the hospital encounter of 07/12/12 (from the past 48 hour(s))  POCT ACTIVATED CLOTTING TIME     Status: Normal   Collection Time   07/12/12  1:48 PM      Component Value Range Comment   Activated Clotting Time 224     POCT ACTIVATED CLOTTING TIME     Status: Normal   Collection Time   07/12/12  2:30 PM      Component Value Range Comment   Activated Clotting Time 199     POCT ACTIVATED CLOTTING TIME     Status: Normal   Collection Time   07/12/12  2:47 PM      Component Value Range Comment   Activated Clotting Time 239     POCT ACTIVATED CLOTTING TIME     Status: Normal   Collection Time   07/12/12  3:09 PM      Component Value Range Comment   Activated Clotting Time 259     POCT ACTIVATED CLOTTING TIME     Status: Normal   Collection Time   07/12/12  3:46 PM      Component Value Range Comment   Activated Clotting Time 249     POCT ACTIVATED CLOTTING TIME     Status: Normal   Collection Time   07/12/12  4:19 PM      Component Value Range Comment   Activated Clotting Time 224     MRSA PCR SCREENING     Status: Normal   Collection Time   07/12/12  5:38 PM      Component Value Range Comment   MRSA by PCR NEGATIVE  NEGATIVE   POCT ACTIVATED CLOTTING TIME     Status: Normal   Collection Time   07/12/12  6:11 PM      Component Value Range Comment   Activated Clotting Time 164     POCT ACTIVATED CLOTTING TIME     Status: Normal   Collection Time   07/12/12  7:45 PM      Component Value Range Comment   Activated Clotting Time 139     CBC     Status: Abnormal   Collection Time   07/13/12  5:40 AM      Component Value Range Comment   WBC 11.8 (*) 4.0 - 10.5 K/uL    RBC 4.22  4.22 - 5.81 MIL/uL    Hemoglobin 12.9 (*) 13.0 - 17.0 g/dL    HCT  38.2 (*) 39.0 - 52.0 %    MCV 90.5  78.0 - 100.0 fL    MCH 30.6  26.0 - 34.0 pg    MCHC 33.8  30.0 - 36.0 g/dL    RDW 13.8  11.5 - 15.5 %    Platelets 140 (*) 150 - 400 K/uL   BASIC METABOLIC PANEL     Status: Abnormal   Collection Time   07/13/12  5:40 AM      Component Value Range Comment   Sodium 136  135 - 145 mEq/L    Potassium 3.3 (*) 3.5 - 5.1 mEq/L    Chloride 101  96 - 112 mEq/L    CO2 28  19 - 32 mEq/L    Glucose, Bld 114 (*) 70 - 99 mg/dL    BUN 13  6 - 23 mg/dL    Creatinine, Ser 1.05  0.50 - 1.35 mg/dL    Calcium 8.6  8.4 - 10.5 mg/dL    GFR calc non Af Amer 69 (*) >90 mL/min    GFR calc Af Amer 80 (*) >90 mL/min     Imaging: Imaging results have been reviewed  Assessment/Plan:   1. Principal Problem: 2.  *PVD (peripheral vascular disease) with claudication, lifestyle limiting, ABI rt. 0.67, lt. 0.73 3. Active Problems: 4.  Thromboembolism, distal  Rt SFA  treated with aspiration thrombectomy, 07/12/12 5.  S/P angioplasty with stent, after DB athrectomy, PTA and stent to Rt. ext. iliac and Rt. SFA, 07/12/12 6.  HTN  (hypertension) 7.   Time Spent Directly with Patient:  30 minutes  Length of Stay:  LOS: 1 day   POD #1 Diamondback orbital rotational atherectomy, PTA & Stent REIA and SFA complicated by distal thromboembolism treated bu aspiration thrombectomy and local infusion of integrillin as well as drip over night. Feels much better this am. Right foot warm and dopplerable PT. Labs OK . Can D/C home this PM on ASA and Plavix. LEA then ROV in 2-3 weeks.  [name] 07/13/2012, 7:56 AM

## 2012-07-13 NOTE — Discharge Summary (Signed)
Physician Discharge Summary  Patient ID: NIKE SOUTHWELL MRN: [id number] DOB/AGE: 05/09/1941 71 y.o.  Admit date: 07/12/2012 Discharge date: 07/13/2012  Discharge Diagnoses:  Principal Problem:  *PVD (peripheral vascular disease) with claudication, lifestyle limiting, ABI rt. 0.67, lt. 0.73 Active Problems:  Hx of ulcerative colitis  Thromboembolism, distal  Rt SFA  treated with aspiration thrombectomy, 07/12/12  S/P angioplasty with stent, after DB athrectomy, PTA and stent to Rt. ext. iliac and Rt. SFA, 07/12/12  HTN (hypertension)  PROCEDURE:PV angiogram, 07/12/12 by Dr. Gwenlyn Found 07/12/12 successful diamondback orbital rotational atherectomy, PTA and stenting of the right external iliac artery and right SFA using distal protection. The patient required at an excessive amount of heparin and appear to be prothrombotic. He required aspiration thrombectomy and administration of Integrilin 2B3A inhibitor for restoration of flow.  Discharged Condition: good  Hospital Course: 71 year old moderately overweight divorced Caucasian male father of 3, was referred to Dr. Gwenlyn Found by Dr. Ila Mcgill at [facility] for peripheral vascular evaluation because of lifestyle limiting claudication. He is a retired Administrator and has a factors include remote tobacco use having quit 35 years ago, however he did smoke one to 3 packs per day for 20 years. History of hypertension and dyslipidemia. He complains of nocturnal pain in his hips and thighs which sound more like "restless leg syndrome and/or spinal stenosis".  He also has bilateral claudication with a right ABI of 0.67 the left of 0.73 with what appears to be high-grade external iliac and SFA disease on the right. Pt presented for elective angiography.  Pt was found to have high-grade calcified right external iliac artery and SFA stenosis.  Dr. Gwenlyn Found proceeded with successful diamondback orbital rotational atherectomy, PTA and stenting of the right external  iliac artery and right SFA using distal protection. The patient required at an excessive amount of heparin and appear to be prothrombotic. He required aspiration thrombectomy and administration of Integrilin 2B3A inhibitor for restoration of flow.  He was admitted to stepdown unit for close monitoring.    By the next AM pt was stable Rt. Foot warm.  By the afternoon he ambulated without complications, and discharged home.  He will have follow up dopplers and see Dr. Gwenlyn Found in follow up.     Consults: None  Significant Diagnostic Studies:  BMET    Component Value Date/Time   NA 136 07/13/2012 0540   K 3.3* 07/13/2012 0540   CL 101 07/13/2012 0540   CO2 28 07/13/2012 0540   GLUCOSE 114* 07/13/2012 0540   BUN 13 07/13/2012 0540   CREATININE 1.05 07/13/2012 0540   CALCIUM 8.6 07/13/2012 0540   GFRNONAA 69* 07/13/2012 0540   GFRAA 80* 07/13/2012 0540    CBC    Component Value Date/Time   WBC 11.8* 07/13/2012 0540   RBC 4.22 07/13/2012 0540   HGB 12.9* 07/13/2012 0540   HCT 38.2* 07/13/2012 0540   PLT 140* 07/13/2012 0540   MCV 90.5 07/13/2012 0540   MCH 30.6 07/13/2012 0540   MCHC 33.8 07/13/2012 0540   RDW 13.8 07/13/2012 0540   LYMPHSABS 1.0 02/09/2012 1518   MONOABS 0.8 02/09/2012 1518   EOSABS 0.3 02/09/2012 1518   BASOSABS 0.0 02/09/2012 1518   CHEST - 2 VIEW --07/08/12 Comparison: Chest x-ray of 02/16/2009  Findings: No active infiltrate or effusion is seen. The heart is  within upper limits normal and stable. There are degenerative  changes throughout the thoracic spine.  IMPRESSION:  No active lung disease.  Discharge Exam: Blood pressure 126/56, pulse 78, temperature 98.4 F (36.9 C), temperature source Oral, resp. rate 18, height 5' 8"  (1.727 m), weight 111.7 kg (246 lb 4.1 oz), SpO2 97.00%.   General appearance: alert, cooperative and no distress  Neck: no adenopathy, no carotid bruit, no JVD, supple, symmetrical, trachea midline and thyroid not enlarged, symmetric, no  tenderness/mass/nodules  Lungs: clear to auscultation bilaterally  Heart: regular rate and rhythm, S1, S2 normal, no murmur, click, rub or gallop  Extremities: extremities normal, atraumatic, no cyanosis or edema and moderate ecchymosis left groin w/o hematome  Pulses: 2+ and symmetric  doppleranle Right PT  Disposition: HOME      Discharge Orders    Future Appointments: Provider: Department: Dept Phone: Center:   10/21/2012 8:30 AM Biagio Borg, MD [facility] [phone] [facility]       Medication List     As of 07/13/2012  2:35 PM    STOP taking these medications         BC HEADACHE POWDER PO      omeprazole 40 MG capsule   Commonly known as: PRILOSEC   Replaced by: pantoprazole 40 MG tablet      TAKE these medications         aspirin 81 MG EC tablet   Take 1 tablet (81 mg total) by mouth daily.      atorvastatin 20 MG tablet   Commonly known as: LIPITOR   Take 1 tablet (20 mg total) by mouth daily.      clopidogrel 75 MG tablet   Commonly known as: PLAVIX   Take 1 tablet (75 mg total) by mouth daily with breakfast.      lisinopril-hydrochlorothiazide 20-25 MG per tablet   Commonly known as: PRINZIDE,ZESTORETIC   Take 1 tablet by mouth daily.      pantoprazole 40 MG tablet   Commonly known as: PROTONIX   Take 1 tablet (40 mg total) by mouth daily.      sulfaSALAzine 500 MG tablet   Commonly known as: AZULFIDINE   Take 1,000 mg by mouth 2 (two) times daily.      zolpidem 5 MG tablet   Commonly known as: AMBIEN   Take 5 mg by mouth at bedtime. For sleep        Follow-up Information    Follow up with [name], MD. On 08/03/2012. (for follow up dopplers,  at 11:30)    Contact information:   [address] [place] [address] [phone]       Follow up with [name], MD. On 08/11/2012. (at 11:15 am)    Contact information:   [address] [place] [address] [phone]          DISCHARGE INSTRUCTIONS: Call [facility] and Vascular Center if any bleeding, swelling or drainage at cath site.  May shower, no tub baths for 48 hours for groin sticks.   No lifting over 8 pounds for 2 days. No driving for 2 days. Heart healthy Diet  Signed: INGOLD,LAURA R 07/13/2012, 2:35 PM

## 2012-07-22 ENCOUNTER — Other Ambulatory Visit (HOSPITAL_COMMUNITY): Payer: Self-pay | Admitting: Cardiovascular Disease

## 2012-07-22 DIAGNOSIS — I70219 Atherosclerosis of native arteries of extremities with intermittent claudication, unspecified extremity: Secondary | ICD-10-CM

## 2012-08-03 ENCOUNTER — Encounter (HOSPITAL_COMMUNITY): Payer: Medicare Other

## 2012-08-10 ENCOUNTER — Encounter (HOSPITAL_COMMUNITY): Payer: Medicare Other

## 2012-08-23 ENCOUNTER — Other Ambulatory Visit: Payer: Self-pay | Admitting: Gastroenterology

## 2012-08-23 MED ORDER — SULFASALAZINE 500 MG PO TABS: 1000.0000 mg | ORAL_TABLET | Freq: Two times a day (BID) | ORAL | Status: DC

## 2012-08-23 NOTE — Telephone Encounter (Signed)
Told patient that we received the fax this morning from [facility] and will go ahead and refill medication but patient needs to schedule a 6 month follow up visit first. Pt scheduled for 09/16/12 at 11:15am. Prescription sent to [facility] and also [facility],

## 2012-08-25 ENCOUNTER — Ambulatory Visit (HOSPITAL_COMMUNITY)
Admission: RE | Admit: 2012-08-25 | Discharge: 2012-08-25 | Disposition: A | Discharge status: Home / Self Care | Payer: Medicare Other | Source: Ambulatory Visit | Attending: Cardiovascular Disease | Admitting: Cardiovascular Disease

## 2012-08-25 DIAGNOSIS — I70219 Atherosclerosis of native arteries of extremities with intermittent claudication, unspecified extremity: Secondary | ICD-10-CM | POA: Insufficient documentation

## 2012-08-25 NOTE — Progress Notes (Signed)
Right lower extremity arterial duplex completed.

## 2012-08-30 ENCOUNTER — Encounter (HOSPITAL_COMMUNITY): Payer: Medicare Other

## 2012-09-16 ENCOUNTER — Encounter: Payer: Self-pay | Admitting: Gastroenterology

## 2012-09-16 ENCOUNTER — Ambulatory Visit (INDEPENDENT_AMBULATORY_CARE_PROVIDER_SITE_OTHER): Payer: Medicare Other | Admitting: Gastroenterology

## 2012-09-16 ENCOUNTER — Other Ambulatory Visit (INDEPENDENT_AMBULATORY_CARE_PROVIDER_SITE_OTHER): Payer: Medicare Other

## 2012-09-16 VITALS — BP 120/60 | HR 75 | Ht 68.75 in | Wt 242.6 lb

## 2012-09-16 DIAGNOSIS — K5130 Ulcerative (chronic) rectosigmoiditis without complications: Secondary | ICD-10-CM

## 2012-09-16 DIAGNOSIS — K2270 Barrett's esophagus without dysplasia: Secondary | ICD-10-CM

## 2012-09-16 LAB — CBC WITH DIFFERENTIAL/PLATELET
Basophils Relative: 0.8 % (ref 0.0–3.0)
Eosinophils Relative: 3.3 % (ref 0.0–5.0)
HCT: 45.7 % (ref 39.0–52.0)
Hemoglobin: 15.5 g/dL (ref 13.0–17.0)
Lymphs Abs: 0.9 10*3/uL (ref 0.7–4.0)
MCV: 90.6 fl (ref 78.0–100.0)
Monocytes Absolute: 0.7 10*3/uL (ref 0.1–1.0)
Monocytes Relative: 10.3 % (ref 3.0–12.0)
Neutro Abs: 4.6 10*3/uL (ref 1.4–7.7)
Platelets: 183 10*3/uL (ref 150.0–400.0)
WBC: 6.4 10*3/uL (ref 4.5–10.5)

## 2012-09-16 LAB — BASIC METABOLIC PANEL
BUN: 16 mg/dL (ref 6–23)
Chloride: 100 mEq/L (ref 96–112)
Potassium: 4 mEq/L (ref 3.5–5.1)
Sodium: 135 mEq/L (ref 135–145)

## 2012-09-16 MED ORDER — SULFASALAZINE 500 MG PO TABS: 1000.0000 mg | ORAL_TABLET | Freq: Two times a day (BID) | ORAL | Status: DC

## 2012-09-16 NOTE — Patient Instructions (Addendum)
Your physician has requested that you go to the basement for the following lab work before leaving today: CBC, Bmet.  We have sent your prescription to [facility] for a 90 day supply.

## 2012-09-16 NOTE — Progress Notes (Signed)
History of Present Illness: This is a 72 year old male with left-sided ulcerative colitis and Barrett's esophagus. Reflux symptoms are under excellent control now on pantoprazole. He has no colorectal complaints. He is currently taking sulfasalazine 500 mg twice daily. Denies weight loss, abdominal pain, constipation, diarrhea, change in stool caliber, melena, hematochezia, nausea, vomiting, dysphagia, reflux symptoms, chest pain.  Current Medications, Allergies, Past Medical History, Past Surgical History, Family History and Social History were reviewed in [employer] record.   Physical Exam:  General: Well developed , well nourished, no acute distress  Head: Normocephalic and atraumatic  Eyes: sclerae anicteric, EOMI  Ears: Normal auditory acuity  Mouth: No deformity or lesions  Lungs: Clear throughout to auscultation  Heart: Regular rate and rhythm; no murmurs, rubs or bruits  Abdomen: Soft, non tender and non distended. No masses, hepatosplenomegaly or hernias noted. Normal Bowel sounds  Musculoskeletal: Symmetrical with no gross deformities  Pulses: Normal pulses noted  Extremities: No clubbing, cyanosis, edema or deformities noted  Neurological: Alert oriented x 4, grossly nonfocal  Psychological: Alert and cooperative. Normal mood and affect   Assessment and Recommendations:   1. Ulcerative colits. Under good control. Sulfasalazine to 1 g twice a day. Obtain BMET and CBC today. Surveillance colonoscopy June 2015.  2. Barrett's esophagus. Continue pantoprazole 40 mg daily and standard antireflux measures. Surveillance endoscopy February 2015.

## 2012-09-20 ENCOUNTER — Encounter (HOSPITAL_COMMUNITY): Payer: Self-pay | Admitting: Respiratory Therapy

## 2012-09-22 ENCOUNTER — Other Ambulatory Visit: Payer: Self-pay | Admitting: Internal Medicine

## 2012-09-22 NOTE — Telephone Encounter (Signed)
Faxed hardcopy to pharmacy. 

## 2012-09-22 NOTE — Telephone Encounter (Signed)
Done hardcopy to robin  

## 2012-09-28 ENCOUNTER — Ambulatory Visit (HOSPITAL_COMMUNITY)
Admission: RE | Admit: 2012-09-28 | Discharge: 2012-09-29 | Disposition: A | Discharge status: Home / Self Care | Payer: Medicare Other | Source: Ambulatory Visit | Attending: Cardiovascular Disease | Admitting: Cardiovascular Disease

## 2012-09-28 ENCOUNTER — Encounter (HOSPITAL_COMMUNITY)
Admission: RE | Disposition: A | Discharge status: Home / Self Care | Payer: Self-pay | Source: Ambulatory Visit | Attending: Cardiovascular Disease

## 2012-09-28 ENCOUNTER — Encounter (HOSPITAL_COMMUNITY): Payer: Self-pay | Admitting: General Practice

## 2012-09-28 DIAGNOSIS — Z95828 Presence of other vascular implants and grafts: Secondary | ICD-10-CM

## 2012-09-28 DIAGNOSIS — I10 Essential (primary) hypertension: Secondary | ICD-10-CM | POA: Insufficient documentation

## 2012-09-28 DIAGNOSIS — I70219 Atherosclerosis of native arteries of extremities with intermittent claudication, unspecified extremity: Secondary | ICD-10-CM | POA: Insufficient documentation

## 2012-09-28 DIAGNOSIS — Z9889 Other specified postprocedural states: Secondary | ICD-10-CM | POA: Insufficient documentation

## 2012-09-28 DIAGNOSIS — I6529 Occlusion and stenosis of unspecified carotid artery: Secondary | ICD-10-CM | POA: Insufficient documentation

## 2012-09-28 DIAGNOSIS — Z95820 Peripheral vascular angioplasty status with implants and grafts: Secondary | ICD-10-CM

## 2012-09-28 DIAGNOSIS — I739 Peripheral vascular disease, unspecified: Secondary | ICD-10-CM | POA: Diagnosis present

## 2012-09-28 DIAGNOSIS — E785 Hyperlipidemia, unspecified: Secondary | ICD-10-CM | POA: Insufficient documentation

## 2012-09-28 DIAGNOSIS — Z87891 Personal history of nicotine dependence: Secondary | ICD-10-CM | POA: Insufficient documentation

## 2012-09-28 DIAGNOSIS — E663 Overweight: Secondary | ICD-10-CM | POA: Insufficient documentation

## 2012-09-28 DIAGNOSIS — I6522 Occlusion and stenosis of left carotid artery: Secondary | ICD-10-CM | POA: Diagnosis present

## 2012-09-28 DIAGNOSIS — I749 Embolism and thrombosis of unspecified artery: Secondary | ICD-10-CM | POA: Diagnosis present

## 2012-09-28 HISTORY — PX: ATHERECTOMY: SHX5730

## 2012-09-28 HISTORY — PX: ATHERECTOMY: SHX5502

## 2012-09-28 HISTORY — DX: Occlusion and stenosis of left carotid artery: I65.22

## 2012-09-28 HISTORY — PX: PERCUTANEOUS STENT INTERVENTION: SHX5500

## 2012-09-28 LAB — POCT ACTIVATED CLOTTING TIME: Activated Clotting Time: 165 seconds

## 2012-09-28 SURGERY — ATHERECTOMY
Anesthesia: LOCAL

## 2012-09-28 MED ORDER — DIAZEPAM 5 MG PO TABS
ORAL_TABLET | ORAL | Status: AC
  Administered 2012-09-28: 5 mg via ORAL
  Filled 2012-09-28: qty 1

## 2012-09-28 MED ORDER — BIVALIRUDIN 250 MG IV SOLR
INTRAVENOUS | Status: AC
  Filled 2012-09-28: qty 250

## 2012-09-28 MED ORDER — MIDAZOLAM HCL 2 MG/2ML IJ SOLN
INTRAMUSCULAR | Status: AC
  Filled 2012-09-28: qty 2

## 2012-09-28 MED ORDER — SULFASALAZINE 500 MG PO TABS
1000.0000 mg | ORAL_TABLET | Freq: Two times a day (BID) | ORAL | Status: DC
  Administered 2012-09-28 – 2012-09-29 (×2): 1000 mg via ORAL
  Filled 2012-09-28 (×4): qty 2

## 2012-09-28 MED ORDER — DIAZEPAM 5 MG PO TABS
5.0000 mg | ORAL_TABLET | ORAL | Status: AC
  Administered 2012-09-28: 5 mg via ORAL

## 2012-09-28 MED ORDER — FAMOTIDINE IN NACL 20-0.9 MG/50ML-% IV SOLN
20.0000 mg | INTRAVENOUS | Status: AC
  Administered 2012-09-28: 20 mg via INTRAVENOUS
  Filled 2012-09-28: qty 50

## 2012-09-28 MED ORDER — SODIUM CHLORIDE 0.9 % IV SOLN
INTRAVENOUS | Status: AC
  Administered 2012-09-28: 15:00:00 via INTRAVENOUS

## 2012-09-28 MED ORDER — HYDROCHLOROTHIAZIDE 25 MG PO TABS
25.0000 mg | ORAL_TABLET | Freq: Every day | ORAL | Status: DC
  Administered 2012-09-29: 10:00:00 25 mg via ORAL
  Filled 2012-09-28 (×2): qty 1

## 2012-09-28 MED ORDER — DIPHENHYDRAMINE HCL 50 MG/ML IJ SOLN
25.0000 mg | INTRAMUSCULAR | Status: AC
  Administered 2012-09-28: 25 mg via INTRAVENOUS

## 2012-09-28 MED ORDER — CLOPIDOGREL BISULFATE 75 MG PO TABS
75.0000 mg | ORAL_TABLET | Freq: Every day | ORAL | Status: DC
  Administered 2012-09-29: 75 mg via ORAL
  Filled 2012-09-28: qty 1

## 2012-09-28 MED ORDER — SODIUM CHLORIDE 0.9 % IJ SOLN: 3.0000 mL | INTRAMUSCULAR | Status: DC | PRN

## 2012-09-28 MED ORDER — SODIUM CHLORIDE 0.9 % IV SOLN
INTRAVENOUS | Status: DC
  Administered 2012-09-28: 12:00:00 via INTRAVENOUS

## 2012-09-28 MED ORDER — ACETAMINOPHEN 325 MG PO TABS
650.0000 mg | ORAL_TABLET | ORAL | Status: DC | PRN
  Administered 2012-09-28: 20:00:00 650 mg via ORAL
  Filled 2012-09-28: qty 2

## 2012-09-28 MED ORDER — LISINOPRIL 20 MG PO TABS
20.0000 mg | ORAL_TABLET | Freq: Every day | ORAL | Status: DC
  Administered 2012-09-29: 10:00:00 20 mg via ORAL
  Filled 2012-09-28 (×2): qty 1

## 2012-09-28 MED ORDER — METHYLPREDNISOLONE SODIUM SUCC 125 MG IJ SOLR
60.0000 mg | INTRAMUSCULAR | Status: AC
  Administered 2012-09-28: 60 mg via INTRAVENOUS

## 2012-09-28 MED ORDER — FENTANYL CITRATE 0.05 MG/ML IJ SOLN
INTRAMUSCULAR | Status: AC
  Filled 2012-09-28: qty 2

## 2012-09-28 MED ORDER — ONDANSETRON HCL 4 MG/2ML IJ SOLN: 4.0000 mg | Freq: Four times a day (QID) | INTRAMUSCULAR | Status: DC | PRN

## 2012-09-28 MED ORDER — DIPHENHYDRAMINE HCL 50 MG/ML IJ SOLN
INTRAMUSCULAR | Status: AC
  Administered 2012-09-28: 25 mg via INTRAVENOUS
  Filled 2012-09-28: qty 1

## 2012-09-28 MED ORDER — ASPIRIN EC 81 MG PO TBEC
81.0000 mg | DELAYED_RELEASE_TABLET | Freq: Every day | ORAL | Status: DC
Start: 2012-09-28 — End: 2012-09-29
  Administered 2012-09-29: 10:00:00 81 mg via ORAL
  Filled 2012-09-28 (×2): qty 1

## 2012-09-28 MED ORDER — METHYLPREDNISOLONE SODIUM SUCC 125 MG IJ SOLR
INTRAMUSCULAR | Status: AC
  Administered 2012-09-28: 60 mg via INTRAVENOUS
  Filled 2012-09-28: qty 2

## 2012-09-28 MED ORDER — ZOLPIDEM TARTRATE 5 MG PO TABS
5.0000 mg | ORAL_TABLET | Freq: Every evening | ORAL | Status: DC | PRN
  Administered 2012-09-29: 01:00:00 5 mg via ORAL
  Filled 2012-09-28: qty 1

## 2012-09-28 MED ORDER — MORPHINE SULFATE 2 MG/ML IJ SOLN: 1.0000 mg | INTRAMUSCULAR | Status: DC | PRN

## 2012-09-28 MED ORDER — HEPARIN (PORCINE) IN NACL 2-0.9 UNIT/ML-% IJ SOLN
INTRAMUSCULAR | Status: AC
  Filled 2012-09-28: qty 1000

## 2012-09-28 MED ORDER — ATORVASTATIN CALCIUM 20 MG PO TABS
20.0000 mg | ORAL_TABLET | Freq: Every day | ORAL | Status: DC
  Filled 2012-09-28 (×2): qty 1

## 2012-09-28 MED ORDER — LIDOCAINE HCL (PF) 1 % IJ SOLN
INTRAMUSCULAR | Status: AC
  Filled 2012-09-28: qty 30

## 2012-09-28 MED ORDER — LISINOPRIL-HYDROCHLOROTHIAZIDE 20-25 MG PO TABS: 1.0000 | ORAL_TABLET | Freq: Every day | ORAL | Status: DC

## 2012-09-28 MED ORDER — PANTOPRAZOLE SODIUM 40 MG PO TBEC
40.0000 mg | DELAYED_RELEASE_TABLET | Freq: Every day | ORAL | Status: DC
  Filled 2012-09-28 (×2): qty 1

## 2012-09-28 MED ORDER — CLOPIDOGREL BISULFATE 300 MG PO TABS
ORAL_TABLET | ORAL | Status: AC
  Filled 2012-09-28: qty 1

## 2012-09-28 NOTE — H&P (Signed)
  H & P will be scanned in.  Pt was reexamined and existing H & P reviewed. No changes found.  [name], MD [facility] 09/28/2012 1:07 PM

## 2012-09-28 NOTE — Progress Notes (Signed)
Site area: right groin  Site Prior to Removal:  Level 0  Pressure Applied For 20 MINUTES    Minutes Beginning at 1800  Manual:   yes  Patient Status During Pull:  stable  Post Pull Groin Site:  Level 0  Post Pull Instructions Given:  yes  Post Pull Pulses Present:  yes  Dressing Applied:  yes  Comments:

## 2012-09-28 NOTE — Progress Notes (Signed)
Utilization Review Completed [employer] [facility] J. Zennie Ayars, RN, BSN, NCM [phone]  

## 2012-09-28 NOTE — CV Procedure (Signed)
[name] [name] is a 72 y.o. male    [id number] LOCATION:  FACILITY: [facility]  PHYSICIAN: Quay Burow, M.D. [birth year]   DATE OF PROCEDURE:  09/28/2012  DATE OF DISCHARGE:  [place]  PV Intervention    History obtained from chart review. [name] [name] is a 72 year old mildly overweight divorced Caucasian male, father 3, temporal and 5 grandchildren who I saw the office on 09/03/2012. History of factors include hypertension, hyperlipidemia remote tobacco abuse. Negative Myoview and carotid Dopplers that did show moderately severe left internal carotid artery stenosis done because of an auscultated bruit. I intervention 07/12/2012 percent of his right external iliac artery and 2 overlapping self expanding stent in his mid right SFA. He had three-vessel runoff. This was done with diamondback orbital rotational atherectomy. A partially he had a thrombotic event requiring Integrilin and aspiration thrombectomy ultimately with excellent result. He did have a high-grade focal calcific mid left SFA stenosis with lifestyle limiting claudication on that side. His right side with symptomatically improved. He presents today for [facility] directional atherectomy plus or minus PTA and stenting of the mid left SFA with distal protection.   PROCEDURE DESCRIPTION:    The patient was brought to the second floor  [facility] lab in the postabsorptive state. He was  premedicated with Valium 5 mg by mouth, IV Versed and fentanyl.[name] His right groinwas prepped and shaved in usual sterile fashion. Xylocaine 1% was used for local anesthesia. A 7 French crossover sheath was inserted into the right common femoral artery using standard Seldinger technique. The patient received  Angiomax bolus and infusion  intravenously.  Total contrast administered the patient was 95 cc. The ACT was measured at 459.  The lesion was crossed with an 014 /300 cm length [place] core wire through a 035  QuickCross endhole catheter a 7 mm spider distal protection device was advanced through the quick cross catheter into the above-the-knee popliteal to provide distal protection. Following this  a [place] turbo hawk directional atherectomy device was used to atherectomize the focal calcified lesion. Approximately 10 circumferential cuts were performed with significant reduction of the lesion. Following this stenting was performed with a 7 mm x 3 cm Abbott Absolut Pro Nitinol self expanding stent and postdilatation was performed with a 6 mm x 2 cm balloon at nominal pressures producing a 90% focal calcified mid left SFA stenosis to 0% residual with three-vessel runoff. The patient tolerated the procedure well. The sheath was then withdrawn back across the bifurcation and secured. The patient left the Cath Lab in stable condition.    HEMODYNAMICS:    AO SYSTOLIC/AO DIASTOLIC: 07/86   IMPRESSION:Successful turbo hawk directional atherectomy, PTA and stenting of a highly calcified focal mid left SFA stenosis for lifestyle and claudication. The patient received 300 mg of by mouth Plavix. The sheath will be removed in several hours and pressure will be held on the groin to achieve hemostasis. The patient left the Cath Lab in stable condition. The visually hydrated, discharged home in the morning and will get followup Dopplers my office.  [name] MD, [facility] 09/28/2012 2:36 PM

## 2012-09-29 ENCOUNTER — Encounter (HOSPITAL_COMMUNITY): Payer: Self-pay | Admitting: Cardiology

## 2012-09-29 ENCOUNTER — Other Ambulatory Visit (HOSPITAL_COMMUNITY): Payer: Self-pay | Admitting: Cardiology

## 2012-09-29 DIAGNOSIS — Z95820 Peripheral vascular angioplasty status with implants and grafts: Secondary | ICD-10-CM

## 2012-09-29 DIAGNOSIS — I6522 Occlusion and stenosis of left carotid artery: Secondary | ICD-10-CM

## 2012-09-29 HISTORY — DX: Occlusion and stenosis of left carotid artery: I65.22

## 2012-09-29 LAB — CBC
Platelets: 145 10*3/uL — ABNORMAL LOW (ref 150–400)
RBC: 4.74 MIL/uL (ref 4.22–5.81)
WBC: 8.7 10*3/uL (ref 4.0–10.5)

## 2012-09-29 LAB — BASIC METABOLIC PANEL
Calcium: 9 mg/dL (ref 8.4–10.5)
GFR calc non Af Amer: 83 mL/min — ABNORMAL LOW (ref >90)
Sodium: 138 mEq/L (ref 135–145)

## 2012-09-29 MED ORDER — PNEUMOCOCCAL VAC POLYVALENT 25 MCG/0.5ML IJ INJ
0.5000 mL | INJECTION | Freq: Once | INTRAMUSCULAR | Status: DC
  Filled 2012-09-29: qty 0.5

## 2012-09-29 MED FILL — Dextrose Inj 5%: INTRAVENOUS | Qty: 50 | Status: AC

## 2012-09-29 NOTE — Progress Notes (Signed)
[name] [name] is a 72 year old WM with PAD undergoing intervention 07/12/2012 percent of his right external iliac artery and 2 overlapping self expanding stent in his mid right SFA. He had three-vessel runoff. This was done with diamondback orbital rotational atherectomy. He had a thrombotic event requiring Integrilin and aspiration thrombectomy ultimately with excellent result. He did have a high-grade focal calcific mid left SFA stenosis with lifestyle limiting claudication on that side. His right side with symptomatically improved. He presented yesterday for TurboHawk directional atherectomy plus or minus PTA and stenting of the mid left SFA with distal protection. He had successful turbo hawk directional atherectomy, PTA and stenting of a highly calcified focal mid left SFA stenosis for lifestyle and claudication   Subjective: No complaints  Objective: Vital signs in last 24 hours: Temp:  [97.5 F (36.4 C)-98.3 F (36.8 C)] 97.7 F (36.5 C) (01/22 0805) Pulse Rate:  [68-100] 68  (01/22 0554) Resp:  [14-23] 15  (01/22 0554) BP: (83-135)/(41-76) 101/57 mmHg (01/22 0805) SpO2:  [95 %-99 %] 98 % (01/22 0554) Weight:  [109.77 kg (242 lb)-110.9 kg (244 lb 7.8 oz)] 110.9 kg (244 lb 7.8 oz) (01/22 0029) Weight change:  Last BM Date: 09/28/12 Intake/Output from previous day:  -760 01/21 0701 - 01/22 0700 In: 540 [P.O.:240; I.V.:300] Out: 1300 [Urine:1300] Intake/Output this shift:    PE: General:alert and oriented, no complaints Heart:S1S2 RRR Lungs:clear Abd:+ BS, soft, non tender QBV:QXIH site stable     Lab Results:  Basename 09/29/12 0705  WBC 8.7  HGB 14.6  HCT 42.9  PLT 145*    Lab Results  Component Value Date   CHOL 131 11/25/2011   HDL 33.20* 11/25/2011   LDLCALC 78 11/25/2011   LDLDIRECT 200.4 10/20/2011   TRIG 98.0 11/25/2011   CHOLHDL 4 11/25/2011   Lab Results  Component Value Date   HGBA1C 5.9 10/20/2011     Lab Results  Component Value Date   TSH 2.41 10/20/2011       EKG: Orders placed in visit on 10/18/10  . CONVERTED CEMR EKG    Studies/Results: No results found.  Medications: I have reviewed the patient's current medications.    [name] aspirin EC  81 mg Oral Daily  . atorvastatin  20 mg Oral Daily  . clopidogrel  75 mg Oral Q breakfast  . lisinopril  20 mg Oral Daily   And  . hydrochlorothiazide  25 mg Oral Daily  . pantoprazole  40 mg Oral Daily  . sulfaSALAzine  1,000 mg Oral BID   Assessment/Plan: Principal Problem:  *S/P angioplasty with stent, and TurboHawk athrectomy mid lt. SFA for life style limiting claudication 09/28/12 Active Problems:  HYPERLIPIDEMIA  PVD (peripheral vascular disease) with claudication, lifestyle limiting, ABI rt. 0.67, lt. 0.73  Thromboembolism, distal  Rt SFA  treated with aspiration thrombectomy, 07/12/12  S/P insertion of iliac artery stent, external iliac and RT SFA  with DB athrectomy 07/12/12  HTN (hypertension)  Stenosis of left carotid artery, mod to severe by dopplers  Plan: Awaiting labs, ambulate and d/c home.  Follow up outpatient dopplers.  Follow up with Dr. Gwenlyn Found.   LOS: 1 day   INGOLD,LAURA R 09/29/2012, 8:32 AM   Agree with note written by Cecilie Kicks RNP  Groin with mild ecchymosis. 2+ LPP. Otherwise looks good and says left foot feels better. Waiting for BMET. OK to D/C on ASA/Plavix. LEA then ROV.  [name] 09/29/2012 8:44 AM

## 2012-09-29 NOTE — Discharge Summary (Signed)
Physician Discharge Summary  Patient ID: [name] [name] MRN: [id number] DOB/AGE: Jan 07, 1941 72 y.o.  Admit date: 09/28/2012 Discharge date: 09/29/2012  Discharge Diagnoses:  Principal Problem:  *S/P angioplasty with stent, and TurboHawk athrectomy mid lt. SFA for life style limiting claudication 09/28/12 Active Problems:  PVD (peripheral vascular disease) with claudication, lifestyle limiting, ABI rt. 0.67, lt. 0.73  HYPERLIPIDEMIA  Thromboembolism, distal  Rt SFA  treated with aspiration thrombectomy, 07/12/12  S/P insertion of iliac artery stent, external iliac and RT SFA  with DB athrectomy 07/12/12  HTN (hypertension)  Stenosis of left carotid artery, mod to severe by dopplers   Discharged Condition: good  Procedures:  PV angiogram with successful turbo hawk directional atherectomy, PTA and stenting of a highly calcified focal mid left SFA stenosis for lifestyle and claudication 09/28/12 by Dr. Gwenlyn Found.   Hospital Course: [name] [name] is a 72 year old WM with PAD undergoing intervention 07/12/2012 DB arthrectomy and PTA stent  of his right external iliac artery and 2 overlapping self expanding stent in his mid right SFA. He had three-vessel runoff. This was done with diamondback orbital rotational atherectomy. He had a thrombotic event requiring Integrilin and aspiration thrombectomy ultimately with excellent result. He did have a high-grade focal calcific mid left SFA stenosis with lifestyle limiting claudication on that side. His right side with symptomatically improved. He presented yesterday for TurboHawk directional atherectomy plus or minus PTA and stenting of the mid left SFA with distal protection. He had successful turbo hawk directional atherectomy, PTA and stenting of a highly calcified focal mid left SFA stenosis for lifestyle and claudication.  Pt did well and by the AM of the 22nd he ambulated without complications was stable, seen by Dr. Gwenlyn Found and discharged home.  He will have  follow up dopplers of lower ext. In our office and then f/u with Dr. Gwenlyn Found.   Consults: None  Significant Diagnostic Studies:  BMET    Component Value Date/Time   NA 138 09/29/2012 0705   K 3.9 09/29/2012 0705   CL 101 09/29/2012 0705   CO2 26 09/29/2012 0705   GLUCOSE 117* 09/29/2012 0705   BUN 15 09/29/2012 0705   CREATININE 0.92 09/29/2012 0705   CALCIUM 9.0 09/29/2012 0705   GFRNONAA 83* 09/29/2012 0705   GFRAA >90 09/29/2012 0705    CBC    Component Value Date/Time   WBC 8.7 09/29/2012 0705   RBC 4.74 09/29/2012 0705   HGB 14.6 09/29/2012 0705   HCT 42.9 09/29/2012 0705   PLT 145* 09/29/2012 0705   MCV 90.5 09/29/2012 0705   MCH 30.8 09/29/2012 0705   MCHC 34.0 09/29/2012 0705   RDW 13.8 09/29/2012 0705   LYMPHSABS 0.9 09/16/2012 1138   MONOABS 0.7 09/16/2012 1138   EOSABS 0.2 09/16/2012 1138   BASOSABS 0.1 09/16/2012 1138       Discharge Exam: Blood pressure 101/57, pulse 68, temperature 97.7 F (36.5 C), temperature source Oral, resp. rate 15, height 5' 8"  (1.727 m), weight 110.9 kg (244 lb 7.8 oz), SpO2 98.00%.   Exam AM of d/c  PE: General:alert and oriented, no complaints  Heart:S1S2 RRR  Lungs:clear  Abd:+ BS, soft, non tender  MGQ:QPYP site stable   Disposition: 01-Home or Self Care      Discharge Orders    Future Appointments: Provider: Department: Dept Phone: Center:   10/21/2012 8:30 AM Biagio Borg, MD [facility] [phone] [facility]       Medication List  As of 09/29/2012  9:55 AM    TAKE these medications         aspirin 81 MG EC tablet   Take 1 tablet (81 mg total) by mouth daily.      atorvastatin 20 MG tablet   Commonly known as: LIPITOR   Take 1 tablet (20 mg total) by mouth daily.      clopidogrel 75 MG tablet   Commonly known as: PLAVIX   Take 1 tablet (75 mg total) by mouth daily with breakfast.      lisinopril-hydrochlorothiazide 20-25 MG per tablet   Commonly known as: PRINZIDE,ZESTORETIC   Take 1 tablet by  mouth daily.      pantoprazole 40 MG tablet   Commonly known as: PROTONIX   Take 1 tablet (40 mg total) by mouth daily.      sulfaSALAzine 500 MG tablet   Commonly known as: AZULFIDINE   Take 2 tablets (1,000 mg total) by mouth 2 (two) times daily.      zolpidem 5 MG tablet   Commonly known as: AMBIEN   TAKE 1 TABLET AT BEDTIME AS NEEDED        Follow-up Information    Follow up with [name], MD. (the office will call you with date and time of follow up dopplers and appt. with Dr. Gwenlyn Found)    Contact information:   [address] [place] [address] [phone]         Discharge Instructions: Call [facility] and Vascular Center if any bleeding, swelling or drainage at cath site.  May shower, no tub baths for 48 hours for groin sticks.   Heart Healthy Diet  No lifting over 5 pounds for 3 days  No driving for 2 days.  If you do not have plavix at home call our office for prescription.   SignedIsaiah Serge 09/29/2012, 9:55 AM

## 2012-09-29 NOTE — Progress Notes (Signed)
Right groin level 1 bruising distal to access site to inner groin soft to touch bilateral pedal pulses present obtained by doppler.

## 2012-10-11 ENCOUNTER — Ambulatory Visit (HOSPITAL_COMMUNITY)
Admission: RE | Admit: 2012-10-11 | Discharge: 2012-10-11 | Disposition: A | Discharge status: Home / Self Care | Payer: Medicare Other | Source: Ambulatory Visit | Attending: Cardiovascular Disease | Admitting: Cardiovascular Disease

## 2012-10-11 DIAGNOSIS — Z9861 Coronary angioplasty status: Secondary | ICD-10-CM | POA: Insufficient documentation

## 2012-10-11 DIAGNOSIS — Z95820 Peripheral vascular angioplasty status with implants and grafts: Secondary | ICD-10-CM

## 2012-10-11 NOTE — Progress Notes (Signed)
BLE Arterial Duplex Completed for post stents.  [name] [name]'

## 2012-10-12 ENCOUNTER — Ambulatory Visit (INDEPENDENT_AMBULATORY_CARE_PROVIDER_SITE_OTHER): Payer: Medicare Other | Admitting: Surgery

## 2012-10-12 ENCOUNTER — Encounter (INDEPENDENT_AMBULATORY_CARE_PROVIDER_SITE_OTHER): Payer: Self-pay | Admitting: Surgery

## 2012-10-12 VITALS — BP 119/79 | HR 68 | Temp 97.8°F | Resp 14 | Ht 68.0 in | Wt 244.0 lb

## 2012-10-12 DIAGNOSIS — R1031 Right lower quadrant pain: Secondary | ICD-10-CM

## 2012-10-12 NOTE — Progress Notes (Signed)
Subjective:     Patient ID: ACEN CRAUN, male   DOB: [birth year], 72 y.o.   MRN: [id number]  HPI This is a patient who's had bilateral laparoscopic inguinal hernia repairs with mesh back in 2009. He has been followed in this office for chronic groin pain ever since. He just had stents placed in the SFA of the right leg several weeks ago and is on Plavix. He reports he has had the pain and a knot in his groin for at least 3 years  Review of Systems     Objective:   Physical Exam On exam, there still swelling in the groin and bruising from his catheter procedure. I cannot feel a specific mass secondary to his discomfort    Assessment:     Chronic right groin pain    Plan:     From a surgical standpoint, there is nothing currently I can do as he is on Plavix. I explained to him that coming off of Plavix right  Now could cause thrombosis Of the stents. I want to wait at least 6 months prior to considering groin exploration. I will see him back at that time

## 2012-10-18 ENCOUNTER — Other Ambulatory Visit (INDEPENDENT_AMBULATORY_CARE_PROVIDER_SITE_OTHER): Payer: Medicare Other

## 2012-10-18 DIAGNOSIS — I10 Essential (primary) hypertension: Secondary | ICD-10-CM

## 2012-10-18 DIAGNOSIS — R7302 Impaired glucose tolerance (oral): Secondary | ICD-10-CM

## 2012-10-18 DIAGNOSIS — Z0000 Encounter for general adult medical examination without abnormal findings: Secondary | ICD-10-CM

## 2012-10-18 DIAGNOSIS — R7309 Other abnormal glucose: Secondary | ICD-10-CM

## 2012-10-18 DIAGNOSIS — E785 Hyperlipidemia, unspecified: Secondary | ICD-10-CM

## 2012-10-18 LAB — LIPID PANEL
Cholesterol: 130 mg/dL (ref 0–200)
HDL: 33 mg/dL — ABNORMAL LOW (ref >39.00)
LDL Cholesterol: 80 mg/dL (ref 0–99)
Total CHOL/HDL Ratio: 4
Triglycerides: 86 mg/dL (ref 0.0–149.0)

## 2012-10-18 LAB — BASIC METABOLIC PANEL
CO2: 28 mEq/L (ref 19–32)
Chloride: 99 mEq/L (ref 96–112)
Creatinine, Ser: 1 mg/dL (ref 0.4–1.5)
Potassium: 4 mEq/L (ref 3.5–5.1)
Sodium: 136 mEq/L (ref 135–145)

## 2012-10-18 LAB — CBC WITH DIFFERENTIAL/PLATELET
Basophils Absolute: 0 10*3/uL (ref 0.0–0.1)
Basophils Relative: 0.7 % (ref 0.0–3.0)
Eosinophils Absolute: 0.2 10*3/uL (ref 0.0–0.7)
Hemoglobin: 15 g/dL (ref 13.0–17.0)
Lymphocytes Relative: 13.2 % (ref 12.0–46.0)
Lymphs Abs: 0.8 10*3/uL (ref 0.7–4.0)
MCHC: 33.6 g/dL (ref 30.0–36.0)
MCV: 90.2 fl (ref 78.0–100.0)
Monocytes Absolute: 0.6 10*3/uL (ref 0.1–1.0)
Neutro Abs: 4.5 10*3/uL (ref 1.4–7.7)
RBC: 4.95 Mil/uL (ref 4.22–5.81)
RDW: 14.2 % (ref 11.5–14.6)

## 2012-10-18 LAB — URINALYSIS, ROUTINE W REFLEX MICROSCOPIC
Bilirubin Urine: NEGATIVE
Hgb urine dipstick: NEGATIVE
Urine Glucose: NEGATIVE
Urobilinogen, UA: 0.2 (ref 0.0–1.0)

## 2012-10-18 LAB — HEPATIC FUNCTION PANEL
Albumin: 4 g/dL (ref 3.5–5.2)
Alkaline Phosphatase: 47 U/L (ref 39–117)
Total Protein: 6.7 g/dL (ref 6.0–8.3)

## 2012-10-21 ENCOUNTER — Ambulatory Visit: Payer: Self-pay | Admitting: Internal Medicine

## 2012-10-21 ENCOUNTER — Ambulatory Visit: Payer: Medicare Other | Admitting: Internal Medicine

## 2012-11-08 ENCOUNTER — Ambulatory Visit (INDEPENDENT_AMBULATORY_CARE_PROVIDER_SITE_OTHER): Payer: Medicare Other | Admitting: Internal Medicine

## 2012-11-08 ENCOUNTER — Encounter: Payer: Self-pay | Admitting: Internal Medicine

## 2012-11-08 VITALS — BP 122/62 | HR 94 | Temp 97.5°F | Resp 16 | Ht 68.0 in | Wt 244.0 lb

## 2012-11-08 DIAGNOSIS — R7309 Other abnormal glucose: Secondary | ICD-10-CM

## 2012-11-08 DIAGNOSIS — I10 Essential (primary) hypertension: Secondary | ICD-10-CM

## 2012-11-08 DIAGNOSIS — J209 Acute bronchitis, unspecified: Secondary | ICD-10-CM

## 2012-11-08 DIAGNOSIS — R062 Wheezing: Secondary | ICD-10-CM | POA: Insufficient documentation

## 2012-11-08 DIAGNOSIS — Z0000 Encounter for general adult medical examination without abnormal findings: Secondary | ICD-10-CM

## 2012-11-08 MED ORDER — CLOPIDOGREL BISULFATE 75 MG PO TABS: 75.0000 mg | ORAL_TABLET | Freq: Every day | ORAL | Status: DC

## 2012-11-08 MED ORDER — PREDNISONE 10 MG PO TABS: ORAL_TABLET | ORAL | Status: DC

## 2012-11-08 MED ORDER — LISINOPRIL-HYDROCHLOROTHIAZIDE 20-25 MG PO TABS: 1.0000 | ORAL_TABLET | Freq: Every day | ORAL | Status: DC

## 2012-11-08 MED ORDER — ZOLPIDEM TARTRATE 10 MG PO TABS: 10.0000 mg | ORAL_TABLET | Freq: Every evening | ORAL | Status: DC | PRN

## 2012-11-08 MED ORDER — AZITHROMYCIN 250 MG PO TABS: ORAL_TABLET | ORAL | Status: DC

## 2012-11-08 MED ORDER — HYDROCODONE-HOMATROPINE 5-1.5 MG/5ML PO SYRP: 5.0000 mL | ORAL_SOLUTION | Freq: Four times a day (QID) | ORAL | Status: DC | PRN

## 2012-11-08 MED ORDER — METHYLPREDNISOLONE ACETATE 80 MG/ML IJ SUSP
80.0000 mg | Freq: Once | INTRAMUSCULAR | Status: AC
  Administered 2012-11-08: 80 mg via INTRAMUSCULAR

## 2012-11-08 MED ORDER — PANTOPRAZOLE SODIUM 40 MG PO TBEC: 40.0000 mg | DELAYED_RELEASE_TABLET | Freq: Every day | ORAL | Status: DC

## 2012-11-08 MED ORDER — ATORVASTATIN CALCIUM 20 MG PO TABS: 20.0000 mg | ORAL_TABLET | Freq: Every day | ORAL | Status: DC

## 2012-11-08 NOTE — Assessment & Plan Note (Signed)

## 2012-11-08 NOTE — Assessment & Plan Note (Signed)
Mild to mod, for antibx course,  to f/u any worsening symptoms or concerns 

## 2012-11-08 NOTE — Progress Notes (Signed)
Subjective:    Patient ID: [name] [name], male    DOB: 1941/04/04, 72 y.o.   MRN: [id number]  HPI   Here for wellness and f/u;  Overall doing ok;  Pt denies CP, worsening SOB, DOE, wheezing, orthopnea, PND, worsening LE edema, palpitations, dizziness or syncope, except for incidentally 2 days acute illness- mild to mod 2-3 days ST, HA, general weakness and malaise, with prod cough greenish sputum.  Pt denies neurological change such as new headache, facial or extremity weakness.  Pt denies polydipsia, polyuria, or low sugar symptoms. Pt states overall good compliance with treatment and medications, good tolerability, and has been trying to follow lower cholesterol diet.  Pt denies worsening depressive symptoms, suicidal ideation or panic. No fever, night sweats, wt loss, loss of appetite, or other constitutional symptoms.  Pt states good ability with ADL's, has low fall risk, home safety reviewed and adequate, no other significant changes in hearing or vision, and only occasionally active with exercise. Past Medical History  Diagnosis Date  . Abdominal pain, generalized 08/15/2008  . Abdominal pain, unspecified site 10/16/2009  . Anal fissure 04/24/2008  . BACK PAIN 01/10/2009  . BARRETTS ESOPHAGUS 08/16/2007  . BREAST HYPERTROPHY 02/16/2009  . CARBUNCLE, NECK 02/14/2008  . CHEST PAIN 10/10/2008  . DEPRESSION 10/16/2009  . DIVERTICULOSIS, COLON 06/28/2007  . GERD 06/28/2007  . GLUCOSE INTOLERANCE 08/15/2008  . Headache 11/13/2009  . HERNIA, UMBILICAL W/OBSTRUCTION W/O GANGRENE 06/28/2007  . HIP PAIN, RIGHT 10/16/2009  . HYPERLIPIDEMIA 06/28/2007  . HYPERTENSION 06/28/2007  . INSOMNIA-SLEEP DISORDER-UNSPEC 10/10/2008  . LEG PAIN, BILATERAL 02/16/2009  . LOW BACK PAIN 06/28/2007  . MASTITIS 01/10/2009  . NECK PAIN 05/31/2008  . NEPHROLITHIASIS, HX OF 06/28/2007  . PROCTOSIGMOIDITIS, ULCERATIVE 1997  . TESTICULAR PAIN, RIGHT 10/25/2010  . VITAMIN D DEFICIENCY 11/13/2009  . Impaired glucose tolerance 04/13/2011  .  Barrett's esophagus 1997  . Arthritis   . Cataract   . PVD (peripheral vascular disease) with claudication, lifestyle limiting, ABI rt. 0.67, lt. 0.73 07/12/2012  . S/P angioplasty with stent, after DB athrectomy to Rt. ext. iliac and Rt. SFA 07/12/2012  . HTN (hypertension) 07/12/2012  . Skin cancer     right hand  . Stenosis of left carotid artery, mod to severe by dopplers 09/29/2012   Past Surgical History  Procedure Laterality Date  . Left knee arthroscopy    . Bilateral inguinal hernia with mesh and umbilical hernia repairs  09/2007  . Anal fissure surgury    . Cataract extraction      bilateral  . Colonoscopy    . Atherectomy  09/28/2012    reports that he quit smoking about 40 years ago. His smokeless tobacco use includes Chew. He reports that he drinks about 3.0 ounces of alcohol per week. He reports that he does not use illicit drugs. family history includes COPD in his father and mother. Allergies  Allergen Reactions  . Contrast Media (Iodinated Diagnostic Agents)    Current Outpatient Prescriptions on File Prior to Visit  Medication Sig Dispense Refill  . aspirin EC 81 MG EC tablet Take 1 tablet (81 mg total) by mouth daily.  30 tablet    . sulfaSALAzine (AZULFIDINE) 500 MG tablet Take 2 tablets (1,000 mg total) by mouth 2 (two) times daily.  360 tablet  1   No current facility-administered medications on file prior to visit.   Review of Systems Constitutional: Negative for diaphoresis, activity change, appetite change or unexpected weight change.  HENT: Negative  for hearing loss, ear pain, facial swelling, mouth sores and neck stiffness.   Eyes: Negative for pain, redness and visual disturbance.  Respiratory: Negative for shortness of breath and wheezing.   Cardiovascular: Negative for chest pain and palpitations.  Gastrointestinal: Negative for diarrhea, blood in stool, abdominal distention or other pain Genitourinary: Negative for hematuria, flank pain or change in  urine volume.  Musculoskeletal: Negative for myalgias and joint swelling.  Skin: Negative for color change and wound.  Neurological: Negative for syncope and numbness. other than noted Hematological: Negative for adenopathy.  Psychiatric/Behavioral: Negative for hallucinations, self-injury, decreased concentration and agitation.      Objective:   Physical Exam BP 122/62  Pulse 94  Temp(Src) 97.5 F (36.4 C) (Oral)  Resp 16  Ht 5' 8"  (1.727 m)  Wt 244 lb (110.678 kg)  BMI 37.11 kg/m2  SpO2 98% VS noted, mild ill Constitutional: Pt is oriented to person, place, and time. Appears well-developed and well-nourished.  Head: Normocephalic and atraumatic.  Right Ear: External ear normal.  Left Ear: External ear normal.  Nose: Nose normal.  Mouth/Throat: Oropharynx is clear and moist.  Eyes: Conjunctivae and EOM are normal. Pupils are equal, round, and reactive to light.  Neck: Normal range of motion. Neck supple. No JVD present. No tracheal deviation present.  Bilat tm's with mild erythema.  Max sinus areas non tender.  Pharynx with mild erythema, no exudate Cardiovascular: Normal rate, regular rhythm, normal heart sounds and intact distal pulses.   Pulmonary/Chest: Effort normal and breath sounds mild decreased bilat, few wheezes.  Abdominal: Soft. Bowel sounds are normal. There is no tenderness. No HSM  Musculoskeletal: Normal range of motion. Exhibits no edema.  Lymphadenopathy:  Has no cervical adenopathy.  Neurological: Pt is alert and oriented to person, place, and time. Pt has normal reflexes. No cranial nerve deficit.  Skin: Skin is warm and dry. No rash noted.  Psychiatric:  Has  normal mood and affect. Behavior is normal. except mild nervous    Assessment & Plan:

## 2012-11-08 NOTE — Patient Instructions (Addendum)
You had the steroid shot today Please take all new medication as prescribed - the antibiotic and prednisone (sent to [facility]) Please take all new medication as prescribed - the cough medicine in the hardcopy to take to the pharmacy Please continue all other medications as before, and refills have been done if requested. Please have the pharmacy call with any other refills you may need. Please continue your efforts at being more active, low cholesterol diet, and weight control. You are otherwise up to date with prevention measures today. Please remember to sign up for My Chart if you have not done so, as this will be important to you in the future with finding out test results, communicating by private email, and scheduling acute appointments online when needed. Please return in 6 months, or sooner if needed, with Lab testing done 3-5 days before

## 2012-11-08 NOTE — Assessment & Plan Note (Signed)
stable overall by history and exam, recent data reviewed with pt, and pt to continue medical treatment as before,  to f/u any worsening symptoms or concerns Lab Results  Component Value Date   HGBA1C 5.8 10/18/2012

## 2012-11-08 NOTE — Assessment & Plan Note (Signed)
stable overall by history and exam, recent data reviewed with pt, and pt to continue medical treatment as before,  to f/u any worsening symptoms or concerns BP Readings from Last 3 Encounters:  11/08/12 122/62  10/12/12 119/79  09/29/12 101/57

## 2012-12-02 ENCOUNTER — Other Ambulatory Visit (HOSPITAL_COMMUNITY): Payer: Self-pay | Admitting: Cardiovascular Disease

## 2012-12-02 DIAGNOSIS — I739 Peripheral vascular disease, unspecified: Secondary | ICD-10-CM

## 2012-12-16 ENCOUNTER — Inpatient Hospital Stay (HOSPITAL_COMMUNITY): Admission: RE | Admit: 2012-12-16 | Payer: Medicare Other | Source: Ambulatory Visit

## 2013-01-06 ENCOUNTER — Ambulatory Visit (HOSPITAL_COMMUNITY)
Admission: RE | Admit: 2013-01-06 | Discharge: 2013-01-06 | Disposition: A | Discharge status: Home / Self Care | Payer: Medicare Other | Source: Ambulatory Visit | Attending: Cardiovascular Disease | Admitting: Cardiovascular Disease

## 2013-01-06 DIAGNOSIS — I739 Peripheral vascular disease, unspecified: Secondary | ICD-10-CM | POA: Insufficient documentation

## 2013-01-10 NOTE — Progress Notes (Signed)
Carotid Duplex Completed.

## 2013-01-26 ENCOUNTER — Telehealth: Payer: Self-pay | Admitting: *Deleted

## 2013-01-26 DIAGNOSIS — I739 Peripheral vascular disease, unspecified: Secondary | ICD-10-CM

## 2013-01-26 NOTE — Telephone Encounter (Signed)
Results of carotid doppler reviewed with [facility]

## 2013-01-26 NOTE — Telephone Encounter (Signed)
Message copied by Chauncy Lean on Wed Jan 26, 2013  5:17 PM ------      Message from: [name]      Created: Thu Jan 20, 2013  5:06 PM       Mod Left ICA stenosis w/o change from prior study. Repeat 6 months ------

## 2013-02-07 ENCOUNTER — Telehealth: Payer: Self-pay | Admitting: Internal Medicine

## 2013-02-07 MED ORDER — TRAZODONE HCL 50 MG PO TABS: 25.0000 mg | ORAL_TABLET | Freq: Every evening | ORAL | Status: DC | PRN

## 2013-02-07 NOTE — Telephone Encounter (Signed)
Called the patient and he has had no sleep med. Since Friday states pharmacy said insurance will not pay for it.  We have not received a PA on his medication.  Please advise

## 2013-02-07 NOTE — Telephone Encounter (Signed)
Ok to try the trazodone 50 qhs prn - done erx

## 2013-02-07 NOTE — Telephone Encounter (Signed)
Requesting a refill on Ambien.  He is out and can't sleep.  Pharmacy called to tell him the insurance won't pay for it.

## 2013-02-08 NOTE — Telephone Encounter (Signed)
Patient informed of change

## 2013-02-09 ENCOUNTER — Telehealth: Payer: Self-pay | Admitting: Cardiovascular Disease

## 2013-02-09 NOTE — Telephone Encounter (Signed)
Please call-she wants to talk to you about the cholesterol medicine that Dr Gwenlyn Found put him on!

## 2013-02-09 NOTE — Telephone Encounter (Signed)
Returned call.  [name] stated pt is still complaining of his legs hurting him.  Stated she has talked to several people about the cholesterol medicine that pt is on and when it was stopped it got better.  Wanted to know if pt is on a high dose of the cholesterol med.  Stated pt has been telling Dr. Gwenlyn Found that his legs still bother him and Dr. Gwenlyn Found said the stents are okay.  Informed pt is not on a high dose.  Advised a -statin holiday x 6 weeks and restart and call back if pain returns after restart.  [name] verbalized understanding and agreed w/ plan.

## 2013-03-16 ENCOUNTER — Other Ambulatory Visit (INDEPENDENT_AMBULATORY_CARE_PROVIDER_SITE_OTHER): Payer: Medicare Other

## 2013-03-16 ENCOUNTER — Encounter: Payer: Self-pay | Admitting: Gastroenterology

## 2013-03-16 ENCOUNTER — Ambulatory Visit (INDEPENDENT_AMBULATORY_CARE_PROVIDER_SITE_OTHER): Payer: Medicare Other | Admitting: Gastroenterology

## 2013-03-16 VITALS — BP 120/60 | HR 88 | Ht 68.0 in | Wt 244.1 lb

## 2013-03-16 DIAGNOSIS — K5100 Ulcerative (chronic) pancolitis without complications: Secondary | ICD-10-CM

## 2013-03-16 DIAGNOSIS — K2270 Barrett's esophagus without dysplasia: Secondary | ICD-10-CM

## 2013-03-16 LAB — BASIC METABOLIC PANEL
BUN: 13 mg/dL (ref 6–23)
Chloride: 103 mEq/L (ref 96–112)
Glucose, Bld: 111 mg/dL — ABNORMAL HIGH (ref 70–99)
Potassium: 4.3 mEq/L (ref 3.5–5.1)
Sodium: 137 mEq/L (ref 135–145)

## 2013-03-16 LAB — CBC WITH DIFFERENTIAL/PLATELET
Basophils Absolute: 0 10*3/uL (ref 0.0–0.1)
Eosinophils Relative: 4.5 % (ref 0.0–5.0)
HCT: 45.5 % (ref 39.0–52.0)
Hemoglobin: 15.5 g/dL (ref 13.0–17.0)
Lymphs Abs: 0.8 10*3/uL (ref 0.7–4.0)
MCV: 93.5 fl (ref 78.0–100.0)
Monocytes Absolute: 0.6 10*3/uL (ref 0.1–1.0)
Monocytes Relative: 10.5 % (ref 3.0–12.0)
Neutro Abs: 4.3 10*3/uL (ref 1.4–7.7)
Platelets: 194 10*3/uL (ref 150.0–400.0)
RDW: 15 % — ABNORMAL HIGH (ref 11.5–14.6)

## 2013-03-16 MED ORDER — PANTOPRAZOLE SODIUM 40 MG PO TBEC: 40.0000 mg | DELAYED_RELEASE_TABLET | Freq: Every day | ORAL | Status: DC

## 2013-03-16 MED ORDER — SULFASALAZINE 500 MG PO TABS: 1000.0000 mg | ORAL_TABLET | Freq: Two times a day (BID) | ORAL | Status: DC

## 2013-03-16 NOTE — Patient Instructions (Signed)
Your physician has requested that you go to the basement for the following lab work before leaving today: CBC, Bmet.  We have sent the following medications to your mail order pharmacy for you: Sulfasalazine and pantoprazole.  Thank you for choosing me and [facility].  [name]. Dagoberto Ligas., MD., Marval Regal

## 2013-03-16 NOTE — Progress Notes (Signed)
History of Present Illness: This is a 72 -year-old male universal ulcerative colitis Barrett's esophagus. He returns for routine followup. He has no gastrointestinal complaints. She specifically denies any diarrhea or rectal bleeding reflux symptoms dysphagia, abdominal pain or chest pain.  Current Medications, Allergies, Past Medical History, Past Surgical History, Family History and Social History were reviewed in [employer] record.  Physical Exam: General: Well developed , well nourished, no acute distress Head: Normocephalic and atraumatic Eyes:  sclerae anicteric, EOMI Ears: Normal auditory acuity Mouth: No deformity or lesions Lungs: Clear throughout to auscultation Heart: Regular rate and rhythm; no murmurs, rubs or bruits Abdomen: Soft, non tender and non distended. No masses, hepatosplenomegaly or hernias noted. Normal Bowel sounds Musculoskeletal: Symmetrical with no gross deformities  Pulses:  Normal pulses noted Extremities: No clubbing, cyanosis, edema or deformities noted Neurological: Alert oriented x 4, grossly nonfocal Psychological:  Alert and cooperative. Normal mood and affect  Assessment and Recommendations:  1. Ulcerative colits. Under good control. Sulfasalazine to 1 g twice a day. Obtain BMET and CBC today. Surveillance colonoscopy June 2015.   2. Barrett's esophagus. Continue pantoprazole 40 mg daily and standard antireflux measures. Surveillance endoscopy February 2015.

## 2013-04-15 ENCOUNTER — Other Ambulatory Visit: Payer: Self-pay

## 2013-04-15 ENCOUNTER — Telehealth: Payer: Self-pay | Admitting: Gastroenterology

## 2013-04-15 MED ORDER — LISINOPRIL-HYDROCHLOROTHIAZIDE 20-25 MG PO TABS: 1.0000 | ORAL_TABLET | Freq: Every day | ORAL | Status: DC

## 2013-04-15 MED ORDER — PANTOPRAZOLE SODIUM 40 MG PO TBEC: 40.0000 mg | DELAYED_RELEASE_TABLET | Freq: Every day | ORAL | Status: DC

## 2013-04-15 MED ORDER — SULFASALAZINE 500 MG PO TABS: 1000.0000 mg | ORAL_TABLET | Freq: Two times a day (BID) | ORAL | Status: DC

## 2013-04-15 MED ORDER — CLOPIDOGREL BISULFATE 75 MG PO TABS: 75.0000 mg | ORAL_TABLET | Freq: Every day | ORAL | Status: DC

## 2013-04-15 NOTE — Telephone Encounter (Signed)
Rx was sent to pharmacy electronically. 

## 2013-04-15 NOTE — Telephone Encounter (Signed)
Left a message telling patient that I will resend the prescriptions to [facility] and to call back if he does not get them in the mail in a week.

## 2013-04-20 ENCOUNTER — Ambulatory Visit (HOSPITAL_COMMUNITY)
Admission: RE | Admit: 2013-04-20 | Discharge: 2013-04-20 | Disposition: A | Discharge status: Home / Self Care | Payer: Medicare Other | Source: Ambulatory Visit | Attending: Cardiovascular Disease | Admitting: Cardiovascular Disease

## 2013-04-20 DIAGNOSIS — I70219 Atherosclerosis of native arteries of extremities with intermittent claudication, unspecified extremity: Secondary | ICD-10-CM

## 2013-04-20 DIAGNOSIS — I739 Peripheral vascular disease, unspecified: Secondary | ICD-10-CM | POA: Insufficient documentation

## 2013-04-20 NOTE — Progress Notes (Signed)
Arterial Lower Ext. Duplex Completed. Oda Cogan, BS, RDMS, RVT

## 2013-04-28 ENCOUNTER — Encounter: Payer: Self-pay | Admitting: *Deleted

## 2013-04-28 ENCOUNTER — Telehealth: Payer: Self-pay | Admitting: *Deleted

## 2013-04-28 DIAGNOSIS — I739 Peripheral vascular disease, unspecified: Secondary | ICD-10-CM

## 2013-04-28 NOTE — Telephone Encounter (Signed)
Message copied by Chauncy Lean on Thu Apr 28, 2013  3:36 PM ------      Message from: [name]      Created: Tue Apr 26, 2013  8:23 PM       No change from prior study. Repeat in 6 months ------

## 2013-05-11 ENCOUNTER — Ambulatory Visit (INDEPENDENT_AMBULATORY_CARE_PROVIDER_SITE_OTHER): Payer: Medicare Other | Admitting: Internal Medicine

## 2013-05-11 ENCOUNTER — Encounter: Payer: Self-pay | Admitting: Internal Medicine

## 2013-05-11 VITALS — BP 108/62 | HR 84 | Temp 98.5°F | Ht 68.0 in | Wt 242.2 lb

## 2013-05-11 DIAGNOSIS — Z23 Encounter for immunization: Secondary | ICD-10-CM

## 2013-05-11 DIAGNOSIS — G4700 Insomnia, unspecified: Secondary | ICD-10-CM

## 2013-05-11 DIAGNOSIS — R7309 Other abnormal glucose: Secondary | ICD-10-CM

## 2013-05-11 DIAGNOSIS — I10 Essential (primary) hypertension: Secondary | ICD-10-CM

## 2013-05-11 DIAGNOSIS — R7302 Impaired glucose tolerance (oral): Secondary | ICD-10-CM

## 2013-05-11 DIAGNOSIS — Z0000 Encounter for general adult medical examination without abnormal findings: Secondary | ICD-10-CM

## 2013-05-11 DIAGNOSIS — E785 Hyperlipidemia, unspecified: Secondary | ICD-10-CM

## 2013-05-11 MED ORDER — CLOPIDOGREL BISULFATE 75 MG PO TABS: 75.0000 mg | ORAL_TABLET | Freq: Every day | ORAL | Status: DC

## 2013-05-11 MED ORDER — ZOLPIDEM TARTRATE 10 MG PO TABS: 10.0000 mg | ORAL_TABLET | Freq: Every evening | ORAL | Status: DC | PRN

## 2013-05-11 MED ORDER — LISINOPRIL-HYDROCHLOROTHIAZIDE 20-25 MG PO TABS: 1.0000 | ORAL_TABLET | Freq: Every day | ORAL | Status: DC

## 2013-05-11 MED ORDER — PANTOPRAZOLE SODIUM 40 MG PO TBEC: 40.0000 mg | DELAYED_RELEASE_TABLET | Freq: Every day | ORAL | Status: DC

## 2013-05-11 NOTE — Assessment & Plan Note (Signed)
stable overall by history and exam, recent data reviewed with pt, and pt to continue medical treatment as before,  to f/u any worsening symptoms or concerns Lab Results  Component Value Date   LDLCALC 80 10/18/2012

## 2013-05-11 NOTE — Progress Notes (Signed)
Subjective:    Patient ID: [name] [name], male    DOB: 1941/06/20, 72 y.o.   MRN: [id number]  HPI  Here to f/u; overall doing ok,  Pt denies chest pain, increased sob or doe, wheezing, orthopnea, PND, increased LE swelling, palpitations, dizziness or syncope.  Pt denies polydipsia, polyuria, or low sugar symptoms such as weakness or confusion improved with po intake.  Pt denies new neurological symptoms such as new headache, or facial or extremity weakness or numbness.   Pt states overall good compliance with meds, has been trying to follow lower cholesterol diet, with wt overall stable,  but little exercise however. Saw podiatry at one point, then Dr Berry/CV now with 3 stents to LE's with improved LE pain symptoms.   Needs ambien refill, no new complaint Past Medical History  Diagnosis Date  . Abdominal pain, generalized 08/15/2008  . Abdominal pain, unspecified site 10/16/2009  . Anal fissure 04/24/2008  . BACK PAIN 01/10/2009  . BARRETTS ESOPHAGUS 08/16/2007  . BREAST HYPERTROPHY 02/16/2009  . CARBUNCLE, NECK 02/14/2008  . CHEST PAIN 10/10/2008  . DEPRESSION 10/16/2009  . DIVERTICULOSIS, COLON 06/28/2007  . GERD 06/28/2007  . GLUCOSE INTOLERANCE 08/15/2008  . Headache(784.0) 11/13/2009  . HERNIA, UMBILICAL W/OBSTRUCTION W/O GANGRENE 06/28/2007  . HIP PAIN, RIGHT 10/16/2009  . HYPERLIPIDEMIA 06/28/2007  . HYPERTENSION 06/28/2007  . INSOMNIA-SLEEP DISORDER-UNSPEC 10/10/2008  . LEG PAIN, BILATERAL 02/16/2009  . LOW BACK PAIN 06/28/2007  . MASTITIS 01/10/2009  . NECK PAIN 05/31/2008  . NEPHROLITHIASIS, HX OF 06/28/2007  . PROCTOSIGMOIDITIS, ULCERATIVE 1997  . TESTICULAR PAIN, RIGHT 10/25/2010  . VITAMIN D DEFICIENCY 11/13/2009  . Impaired glucose tolerance 04/13/2011  . Barrett's esophagus 1997  . Arthritis   . Cataract   . PVD (peripheral vascular disease) with claudication, lifestyle limiting, ABI rt. 0.67, lt. 0.73 07/12/2012  . S/P angioplasty with stent, after DB athrectomy to Rt. ext. iliac and Rt.  SFA 07/12/2012  . HTN (hypertension) 07/12/2012  . Skin cancer     right hand  . Stenosis of left carotid artery, mod to severe by dopplers 09/29/2012   Past Surgical History  Procedure Laterality Date  . Left knee arthroscopy    . Bilateral inguinal hernia with mesh and umbilical hernia repairs  09/2007  . Anal fissure surgury    . Cataract extraction      bilateral  . Colonoscopy    . Atherectomy  09/28/2012    reports that he quit smoking about 40 years ago. His smokeless tobacco use includes Chew. He reports that he drinks about 3.0 ounces of alcohol per week. He reports that he does not use illicit drugs. family history includes COPD in his father and mother. Allergies  Allergen Reactions  . Contrast Media [Iodinated Diagnostic Agents]    Current Outpatient Prescriptions on File Prior to Visit  Medication Sig Dispense Refill  . aspirin EC 81 MG EC tablet Take 1 tablet (81 mg total) by mouth daily.  30 tablet    . HYDROcodone-homatropine (HYCODAN) 5-1.5 MG/5ML syrup Take 5 mLs by mouth every 6 (six) hours as needed for cough.  120 mL  1  . sulfaSALAzine (AZULFIDINE) 500 MG tablet Take 2 tablets (1,000 mg total) by mouth 2 (two) times daily.  360 tablet  1  . tamsulosin (FLOMAX) 0.4 MG CAPS       . zolpidem (AMBIEN) 10 MG tablet        No current facility-administered medications on file prior to visit.   Review  of Systems  Constitutional: Negative for unexpected weight change, or unusual diaphoresis  HENT: Negative for tinnitus.   Eyes: Negative for photophobia and visual disturbance.  Respiratory: Negative for choking and stridor.   Gastrointestinal: Negative for vomiting and blood in stool.  Genitourinary: Negative for hematuria and decreased urine volume.  Musculoskeletal: Negative for acute joint swelling Skin: Negative for color change and wound.  Neurological: Negative for tremors and numbness other than noted  Psychiatric/Behavioral: Negative for decreased  concentration or  hyperactivity.       Objective:   Physical Exam BP 108/62  Pulse 84  Temp(Src) 98.5 F (36.9 C) (Oral)  Ht 5' 8"  (1.727 m)  Wt 242 lb 4 oz (109.884 kg)  BMI 36.84 kg/m2  SpO2 95% VS noted,  Constitutional: Pt appears well-developed and well-nourished.  HENT: Head: NCAT.  Right Ear: External ear normal.  Left Ear: External ear normal.  Eyes: Conjunctivae and EOM are normal. Pupils are equal, round, and reactive to light.  Neck: Normal range of motion. Neck supple.  Cardiovascular: Normal rate and regular rhythm.   Pulmonary/Chest: Effort normal and breath sounds normal.  Abd:  Soft, NT, non-distended, + BS Neurological: Pt is alert. Not confused  Skin: Skin is warm. No erythema.  Psychiatric: Pt behavior is normal. Thought content normal.         Assessment & Plan:

## 2013-05-11 NOTE — Assessment & Plan Note (Signed)
stable overall by history and exam, recent data reviewed with pt, and pt to continue medical treatment as before,  to f/u any worsening symptoms or concerns BP Readings from Last 3 Encounters:  05/11/13 108/62  03/16/13 120/60  11/08/12 122/62

## 2013-05-11 NOTE — Assessment & Plan Note (Signed)
Ok for [employer] prn,.  to f/u any worsening symptoms or concerns

## 2013-05-11 NOTE — Assessment & Plan Note (Signed)
Asympt, stable overall by history and exam, recent data reviewed with pt, and pt to continue medical treatment as before,  to f/u any worsening symptoms or concerns Lab Results  Component Value Date   HGBA1C 5.8 10/18/2012   To cont work on diet, exercise, wt loss

## 2013-05-11 NOTE — Patient Instructions (Addendum)
You had the flu shot today Please continue all other medications as before, and refills have been done if requested, the ambien Please have the pharmacy call with any other refills you may need. No further blood work needed today Please keep your appointments with your specialists as you have planned  Please return in 6 months, or sooner if needed, with Lab testing done 3-5 days before

## 2013-06-10 ENCOUNTER — Ambulatory Visit (INDEPENDENT_AMBULATORY_CARE_PROVIDER_SITE_OTHER): Payer: Medicare Other | Admitting: Surgery

## 2013-06-10 ENCOUNTER — Encounter (INDEPENDENT_AMBULATORY_CARE_PROVIDER_SITE_OTHER): Payer: Self-pay | Admitting: Surgery

## 2013-06-10 ENCOUNTER — Encounter (INDEPENDENT_AMBULATORY_CARE_PROVIDER_SITE_OTHER): Payer: Self-pay | Admitting: General Surgery

## 2013-06-10 VITALS — BP 130/86 | HR 70 | Temp 98.6°F | Resp 16 | Ht 68.0 in | Wt 241.4 lb

## 2013-06-10 DIAGNOSIS — R1031 Right lower quadrant pain: Secondary | ICD-10-CM

## 2013-06-10 NOTE — Progress Notes (Signed)
Subjective:     Patient ID: [name] [name], male   DOB: 10/19/1940, 72 y.o.   MRN: [id number]  HPI He is still experiencing chronic right groin pain. He also notices an intermittent bulge in the groin. He remains on Plavix. The pain can be moderate and severe as well as sharp  Review of Systems     Objective:   Physical Exam On exam, he is moderately tender with guarding in the right groin. This is along the testicular cord at the external ring. It is difficult to feel a hernia defect    Assessment:     Chronic right groin pain     Plan:     I believe that exploration of the right groin is warranted to see if there is indeed a hernia present or see if I can do something to relieve his discomfort. He will need cardiac clearance first to see if he can come off of Plavix for surgery. Once this has been obtained, we will schedule the surgery

## 2013-06-13 ENCOUNTER — Encounter: Payer: Self-pay | Admitting: *Deleted

## 2013-06-13 ENCOUNTER — Ambulatory Visit (INDEPENDENT_AMBULATORY_CARE_PROVIDER_SITE_OTHER): Payer: Medicare Other | Admitting: Cardiovascular Disease

## 2013-06-13 ENCOUNTER — Encounter: Payer: Self-pay | Admitting: Cardiovascular Disease

## 2013-06-13 VITALS — BP 130/78 | HR 84 | Ht 68.0 in | Wt 243.5 lb

## 2013-06-13 DIAGNOSIS — E785 Hyperlipidemia, unspecified: Secondary | ICD-10-CM

## 2013-06-13 DIAGNOSIS — I10 Essential (primary) hypertension: Secondary | ICD-10-CM

## 2013-06-13 DIAGNOSIS — I739 Peripheral vascular disease, unspecified: Secondary | ICD-10-CM

## 2013-06-13 DIAGNOSIS — I6529 Occlusion and stenosis of unspecified carotid artery: Secondary | ICD-10-CM

## 2013-06-13 DIAGNOSIS — I6522 Occlusion and stenosis of left carotid artery: Secondary | ICD-10-CM

## 2013-06-13 NOTE — Assessment & Plan Note (Signed)
Moderate left ICA stenosis by duplex ultrasound checked 01/06/13, due to be rechecked in October

## 2013-06-13 NOTE — Assessment & Plan Note (Signed)
Well-controlled on current medications 

## 2013-06-13 NOTE — Patient Instructions (Signed)
Your ARE SCHEDULE TO HAVE LOWER ARTERIAL DOPPLER IN FEB 2015  YOUR ARE SCHEDULE TO HAVE CAROTID DOPPLER IN OCT 2014 OF THIS MONTH  CLEARANCE FOR YOUR SURGERY WITH Dr Ninfa Linden -HERNIA (low risk study)   Your physician recommends that you schedule a follow-up appointment an extender in 6 month.  Your physician wants you to follow-up in 12 month Dr Gwenlyn Found. You will receive a reminder letter in the mail two months in advance. If you don't receive a letter, please call our office to schedule the follow-up appointment.

## 2013-06-13 NOTE — Progress Notes (Signed)
06/13/2013 [name] [name]   11-02-1940  [id number]  Primary Physician [name], MD Primary Cardiologist: [name] MD [name]   HPI:  The patient is a 72 year old, mildly overweight, divorced Caucasian male, father of 76, grandfather to 5 grandchildren who I last saw in the office 3 months ago. I initially saw him June 28, 2012, for lifestyle-limiting claudication. 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 He had Dopplers in our office that suggested bilateral iliac and SFA disease. He had a negative Myoview and carotid Dopplers that did show moderately severe left ICA stenosis done because of an auscultated bruit. I angiogram'd him on July 12, 2012, and put a stent in his right external iliac as well as 2 overlapping self-expanding stents in his mid right SFA. He had 3-vessel runoff. Unfortunately, he had some thrombus form and needed to undergo aspiration thrombectomy administration of intravenous Integrilin which resulted in improvement in his blood flow. He did stay overnight in the ICU and was discharged home. Followup Dopplers showed normal ABIs on the right and he said he did have clinical improvement but had left calf claudication with angiographically documented 80% mid left SFA stenosis. On September 28, 2012, he underwent Diamondback orbital rotational atherectomy, PTA and stenting of his mid left SFA with an excellent result. His most recent Dopplers revealed ABIs in the mid to high 0.8 range bilaterally with patent stents. He does still complain of leg pain, however. He is scheduled for hernia repair by Dr. Rush Farmer. A Myoview stress test performed 07/08/12 was low risk. Uncaring him for his surgery at low cardiac vascular risk.    Current Outpatient Prescriptions  Medication Sig Dispense Refill  . aspirin EC 81 MG EC tablet Take 1 tablet (81 mg total) by mouth daily.  30 tablet    .  atorvastatin (LIPITOR) 20 MG tablet Take 20 mg by mouth at bedtime.      . clopidogrel (PLAVIX) 75 MG tablet Take 1 tablet (75 mg total) by mouth daily.  90 tablet  3  . lisinopril-hydrochlorothiazide (PRINZIDE,ZESTORETIC) 20-25 MG per tablet Take 1 tablet by mouth daily.  90 tablet  3  . pantoprazole (PROTONIX) 40 MG tablet Take 1 tablet (40 mg total) by mouth daily.  90 tablet  3  . sulfaSALAzine (AZULFIDINE) 500 MG tablet Take 2 tablets (1,000 mg total) by mouth 2 (two) times daily.  360 tablet  1  . tamsulosin (FLOMAX) 0.4 MG CAPS Take 0.4 mg by mouth as needed.       . zolpidem (AMBIEN) 10 MG tablet Take 1 tablet (10 mg total) by mouth at bedtime as needed for sleep.  30 tablet  5   No current facility-administered medications for this visit.    Allergies  Allergen Reactions  . Contrast Media [Iodinated Diagnostic Agents]     History   Social History  . Marital Status: Divorced    Spouse Name: N/A    Number of Children: 3  . Years of Education: N/A   Occupational History  . retired Administrator    Social History Main Topics  . Smoking status: Former Smoker    Quit date: 09/08/1972  . Smokeless tobacco: Current User    Types: Chew  . Alcohol Use: 3.0 oz/week    5 Cans of beer per week     Comment: 1 beer  . Drug Use: No  . Sexual Activity: Not on  file   Other Topics Concern  . Not on file   Social History Narrative   Daily caffeine 1 cup coffee daily     Review of Systems: General: negative for chills, fever, night sweats or weight changes.  Cardiovascular: negative for chest pain, dyspnea on exertion, edema, orthopnea, palpitations, paroxysmal nocturnal dyspnea or shortness of breath Dermatological: negative for rash Respiratory: negative for cough or wheezing Urologic: negative for hematuria Abdominal: negative for nausea, vomiting, diarrhea, bright red blood per rectum, melena, or hematemesis Neurologic: negative for visual changes, syncope, or  dizziness All other systems reviewed and are otherwise negative except as noted above.    Blood pressure 130/78, pulse 84, height 5' 8"  (1.727 m), weight 243 lb 8 oz (110.451 kg).  General appearance: alert and no distress Neck: no adenopathy, no carotid bruit, no JVD, supple, symmetrical, trachea midline and thyroid not enlarged, symmetric, no tenderness/mass/nodules Lungs: clear to auscultation bilaterally Heart: regular rate and rhythm, S1, S2 normal, no murmur, click, rub or gallop Extremities: extremities normal, atraumatic, no cyanosis or edema  EKG normal sinus rhythm at 84 without ST or T wave changes  ASSESSMENT AND PLAN:   PVD (peripheral vascular disease) with claudication, lifestyle limiting, ABI rt. 0.67, lt. 0.73 Status post diamondback orbital rotational atherectomy, PTA and stenting of right SFA as well as stenting of right external iliac artery 07/12/12. He had staged turbo hock directional atherectomy of mid left SFA with a stenting 09/28/12. He still complains of some claudication. His Dopplers recently performed 04/20/13 revealed ABI 0.9 range bilaterally with mild "in-stent restenosis" within both SFA stents. This will be rechecked semiannually  HYPERTENSION Well-controlled on current medications  HYPERLIPIDEMIA On statin therapy with recent lipid profile performed 10/18/12 revealing total cholesterol 1:30, LDL 80 HDL of 33  Stenosis of left carotid artery, mod to severe by dopplers Moderate left ICA stenosis by duplex ultrasound checked 01/06/13, due to be rechecked in October      [name] MD [facility], [facility] 06/13/2013 4:30 PM

## 2013-06-13 NOTE — Assessment & Plan Note (Signed)
On statin therapy with recent lipid profile performed 10/18/12 revealing total cholesterol 1:30, LDL 80 HDL of 33

## 2013-06-13 NOTE — Assessment & Plan Note (Signed)
Status post diamondback orbital rotational atherectomy, PTA and stenting of right SFA as well as stenting of right external iliac artery 07/12/12. He had staged turbo hock directional atherectomy of mid left SFA with a stenting 09/28/12. He still complains of some claudication. His Dopplers recently performed 04/20/13 revealed ABI 0.9 range bilaterally with mild "in-stent restenosis" within both SFA stents. This will be rechecked semiannually

## 2013-06-20 ENCOUNTER — Other Ambulatory Visit (INDEPENDENT_AMBULATORY_CARE_PROVIDER_SITE_OTHER): Payer: Self-pay | Admitting: Surgery

## 2013-06-27 ENCOUNTER — Telehealth (INDEPENDENT_AMBULATORY_CARE_PROVIDER_SITE_OTHER): Payer: Self-pay | Admitting: Surgery

## 2013-06-27 NOTE — Telephone Encounter (Signed)
Pt called sx scheduling / wants sx/ OV states need CC

## 2013-06-28 ENCOUNTER — Encounter (INDEPENDENT_AMBULATORY_CARE_PROVIDER_SITE_OTHER): Payer: Self-pay

## 2013-06-28 ENCOUNTER — Other Ambulatory Visit (INDEPENDENT_AMBULATORY_CARE_PROVIDER_SITE_OTHER): Payer: Self-pay | Admitting: Surgery

## 2013-06-29 ENCOUNTER — Telehealth (HOSPITAL_COMMUNITY): Payer: Self-pay | Admitting: *Deleted

## 2013-06-30 ENCOUNTER — Ambulatory Visit (HOSPITAL_COMMUNITY)
Admission: RE | Admit: 2013-06-30 | Discharge: 2013-06-30 | Disposition: A | Discharge status: Home / Self Care | Payer: Medicare Other | Source: Ambulatory Visit | Attending: Cardiology | Admitting: Cardiology

## 2013-06-30 DIAGNOSIS — I739 Peripheral vascular disease, unspecified: Secondary | ICD-10-CM | POA: Insufficient documentation

## 2013-06-30 NOTE — Progress Notes (Signed)
Carotid Duplex Completed. °Brianna L Mazza,RVT °

## 2013-07-05 ENCOUNTER — Encounter: Payer: Self-pay | Admitting: *Deleted

## 2013-07-05 ENCOUNTER — Telehealth: Payer: Self-pay | Admitting: *Deleted

## 2013-07-05 DIAGNOSIS — I6529 Occlusion and stenosis of unspecified carotid artery: Secondary | ICD-10-CM

## 2013-07-05 NOTE — Telephone Encounter (Signed)
Order placed for repeat carotid dopplers in 6 months  

## 2013-07-05 NOTE — Telephone Encounter (Signed)
Message copied by Chauncy Lean on Tue Jul 05, 2013  4:38 PM ------      Message from: [name]      Created: Sat Jul 02, 2013  5:00 PM       No change from prior study. Repeat in 6 months ------

## 2013-07-15 ENCOUNTER — Encounter (HOSPITAL_BASED_OUTPATIENT_CLINIC_OR_DEPARTMENT_OTHER): Payer: Self-pay | Admitting: *Deleted

## 2013-07-15 NOTE — Progress Notes (Signed)
Will come in for bmet-has clearance from dr berry-to stop plavix-denies any resp problems and snoring

## 2013-07-15 NOTE — Progress Notes (Signed)
07/15/13 1331  OBSTRUCTIVE SLEEP APNEA  Have you ever been diagnosed with sleep apnea through a sleep study? No  Do you snore loudly (loud enough to be heard through closed doors)?  0  Do you often feel tired, fatigued, or sleepy during the daytime? 0  Has anyone observed you stop breathing during your sleep? 0  Do you have, or are you being treated for high blood pressure? 1  BMI more than 35 kg/m2? 1  Age over 72 years old? 1  Neck circumference greater than 40 cm/18 inches? 0  Gender: 1  Obstructive Sleep Apnea Score 4  Score 4 or greater  Results sent to PCP

## 2013-07-18 ENCOUNTER — Encounter (HOSPITAL_BASED_OUTPATIENT_CLINIC_OR_DEPARTMENT_OTHER)
Admission: RE | Admit: 2013-07-18 | Discharge: 2013-07-18 | Disposition: A | Discharge status: Home / Self Care | Payer: Medicare Other | Source: Ambulatory Visit | Attending: Surgery | Admitting: Surgery

## 2013-07-18 LAB — BASIC METABOLIC PANEL
CO2: 29 mEq/L (ref 19–32)
Calcium: 9.2 mg/dL (ref 8.4–10.5)
Chloride: 102 mEq/L (ref 96–112)
GFR calc Af Amer: >90 mL/min (ref >90)
Glucose, Bld: 120 mg/dL — ABNORMAL HIGH (ref 70–99)
Potassium: 5 mEq/L (ref 3.5–5.1)
Sodium: 138 mEq/L (ref 135–145)

## 2013-07-20 ENCOUNTER — Encounter (HOSPITAL_BASED_OUTPATIENT_CLINIC_OR_DEPARTMENT_OTHER): Payer: Medicare Other | Admitting: Certified Registered Nurse Anesthetist

## 2013-07-20 ENCOUNTER — Ambulatory Visit (HOSPITAL_BASED_OUTPATIENT_CLINIC_OR_DEPARTMENT_OTHER): Payer: Medicare Other | Admitting: Certified Registered Nurse Anesthetist

## 2013-07-20 ENCOUNTER — Ambulatory Visit (HOSPITAL_BASED_OUTPATIENT_CLINIC_OR_DEPARTMENT_OTHER)
Admission: RE | Admit: 2013-07-20 | Discharge: 2013-07-20 | Disposition: A | Discharge status: Home / Self Care | Payer: Medicare Other | Source: Ambulatory Visit | Attending: Surgery | Admitting: Surgery

## 2013-07-20 ENCOUNTER — Encounter (HOSPITAL_BASED_OUTPATIENT_CLINIC_OR_DEPARTMENT_OTHER)
Admission: RE | Disposition: A | Discharge status: Home / Self Care | Payer: Self-pay | Source: Ambulatory Visit | Attending: Surgery

## 2013-07-20 ENCOUNTER — Encounter (HOSPITAL_BASED_OUTPATIENT_CLINIC_OR_DEPARTMENT_OTHER): Payer: Self-pay | Admitting: Certified Registered Nurse Anesthetist

## 2013-07-20 DIAGNOSIS — R109 Unspecified abdominal pain: Secondary | ICD-10-CM | POA: Insufficient documentation

## 2013-07-20 DIAGNOSIS — F329 Major depressive disorder, single episode, unspecified: Secondary | ICD-10-CM | POA: Insufficient documentation

## 2013-07-20 DIAGNOSIS — I10 Essential (primary) hypertension: Secondary | ICD-10-CM | POA: Insufficient documentation

## 2013-07-20 DIAGNOSIS — Z87891 Personal history of nicotine dependence: Secondary | ICD-10-CM | POA: Insufficient documentation

## 2013-07-20 DIAGNOSIS — F3289 Other specified depressive episodes: Secondary | ICD-10-CM | POA: Insufficient documentation

## 2013-07-20 DIAGNOSIS — R599 Enlarged lymph nodes, unspecified: Secondary | ICD-10-CM

## 2013-07-20 DIAGNOSIS — E669 Obesity, unspecified: Secondary | ICD-10-CM | POA: Insufficient documentation

## 2013-07-20 DIAGNOSIS — E785 Hyperlipidemia, unspecified: Secondary | ICD-10-CM | POA: Insufficient documentation

## 2013-07-20 DIAGNOSIS — I739 Peripheral vascular disease, unspecified: Secondary | ICD-10-CM | POA: Insufficient documentation

## 2013-07-20 DIAGNOSIS — K219 Gastro-esophageal reflux disease without esophagitis: Secondary | ICD-10-CM | POA: Insufficient documentation

## 2013-07-20 HISTORY — PX: GROIN DISSECTION: SHX5250

## 2013-07-20 HISTORY — DX: Complete loss of teeth, unspecified cause, unspecified class: K08.109

## 2013-07-20 HISTORY — DX: Presence of dental prosthetic device (complete) (partial): Z97.2

## 2013-07-20 LAB — POCT HEMOGLOBIN-HEMACUE: Hemoglobin: 14.9 g/dL (ref 13.0–17.0)

## 2013-07-20 SURGERY — EXPLORATION, INGUINAL REGION
Anesthesia: General | Site: Groin | Laterality: Right | Wound class: Clean

## 2013-07-20 MED ORDER — OXYCODONE HCL 5 MG PO TABS
5.0000 mg | ORAL_TABLET | Freq: Once | ORAL | Status: DC | PRN
Start: 2013-07-20 — End: 2013-07-20

## 2013-07-20 MED ORDER — PROPOFOL 10 MG/ML IV BOLUS
INTRAVENOUS | Status: DC | PRN
  Administered 2013-07-20: 220 mg via INTRAVENOUS

## 2013-07-20 MED ORDER — DEXAMETHASONE SODIUM PHOSPHATE 4 MG/ML IJ SOLN
INTRAMUSCULAR | Status: DC | PRN
  Administered 2013-07-20: 4 mg via INTRAVENOUS

## 2013-07-20 MED ORDER — ONDANSETRON HCL 4 MG/2ML IJ SOLN: 4.0000 mg | Freq: Four times a day (QID) | INTRAMUSCULAR | Status: DC | PRN

## 2013-07-20 MED ORDER — BUPIVACAINE-EPINEPHRINE 0.5% -1:200000 IJ SOLN
INTRAMUSCULAR | Status: DC | PRN
  Administered 2013-07-20: 20 mL

## 2013-07-20 MED ORDER — ONDANSETRON HCL 4 MG/2ML IJ SOLN
INTRAMUSCULAR | Status: DC | PRN
  Administered 2013-07-20: 4 mg via INTRAVENOUS

## 2013-07-20 MED ORDER — MIDAZOLAM HCL 2 MG/2ML IJ SOLN: 1.0000 mg | INTRAMUSCULAR | Status: DC | PRN

## 2013-07-20 MED ORDER — CEFAZOLIN SODIUM-DEXTROSE 2-3 GM-% IV SOLR
2.0000 g | INTRAVENOUS | Status: AC
  Administered 2013-07-20: 2 g via INTRAVENOUS

## 2013-07-20 MED ORDER — LIDOCAINE HCL (CARDIAC) 20 MG/ML IV SOLN
INTRAVENOUS | Status: DC | PRN
  Administered 2013-07-20: 60 mg via INTRAVENOUS

## 2013-07-20 MED ORDER — FENTANYL CITRATE 0.05 MG/ML IJ SOLN
INTRAMUSCULAR | Status: DC | PRN
  Administered 2013-07-20 (×3): 50 ug via INTRAVENOUS

## 2013-07-20 MED ORDER — LACTATED RINGERS IV SOLN
INTRAVENOUS | Status: DC
  Administered 2013-07-20: 20 mL/h via INTRAVENOUS

## 2013-07-20 MED ORDER — 0.9 % SODIUM CHLORIDE (POUR BTL) OPTIME
TOPICAL | Status: DC | PRN
  Administered 2013-07-20: 1000 mL

## 2013-07-20 MED ORDER — HYDROCODONE-ACETAMINOPHEN 5-325 MG PO TABS: 1.0000 | ORAL_TABLET | ORAL | Status: DC | PRN

## 2013-07-20 MED ORDER — BUPIVACAINE-EPINEPHRINE PF 0.5-1:200000 % IJ SOLN
INTRAMUSCULAR | Status: AC
  Filled 2013-07-20: qty 30

## 2013-07-20 MED ORDER — FENTANYL CITRATE 0.05 MG/ML IJ SOLN
INTRAMUSCULAR | Status: AC
  Filled 2013-07-20: qty 6

## 2013-07-20 MED ORDER — OXYCODONE HCL 5 MG/5ML PO SOLN: 5.0000 mg | Freq: Once | ORAL | Status: DC | PRN

## 2013-07-20 MED ORDER — FENTANYL CITRATE 0.05 MG/ML IJ SOLN
INTRAMUSCULAR | Status: AC
  Filled 2013-07-20: qty 2

## 2013-07-20 MED ORDER — CEFAZOLIN SODIUM 1-5 GM-% IV SOLN
INTRAVENOUS | Status: AC
  Filled 2013-07-20: qty 100

## 2013-07-20 MED ORDER — FENTANYL CITRATE 0.05 MG/ML IJ SOLN
25.0000 ug | INTRAMUSCULAR | Status: DC | PRN
  Administered 2013-07-20: 50 ug via INTRAVENOUS
  Administered 2013-07-20: 25 ug via INTRAVENOUS

## 2013-07-20 MED ORDER — FENTANYL CITRATE 0.05 MG/ML IJ SOLN: 50.0000 ug | INTRAMUSCULAR | Status: DC | PRN

## 2013-07-20 SURGICAL SUPPLY — 44 items
BENZOIN TINCTURE PRP APPL 2/3 (GAUZE/BANDAGES/DRESSINGS) ×2 IMPLANT
BLADE HEX COATED 2.75 (ELECTRODE) ×2 IMPLANT
BLADE SURG 10 STRL SS (BLADE) ×2 IMPLANT
BLADE SURG ROTATE 9660 (MISCELLANEOUS) ×2 IMPLANT
CHLORAPREP W/TINT 26ML (MISCELLANEOUS) ×2 IMPLANT
COVER MAYO STAND STRL (DRAPES) ×2 IMPLANT
COVER TABLE BACK 60X90 (DRAPES) ×2 IMPLANT
DECANTER SPIKE VIAL GLASS SM (MISCELLANEOUS) IMPLANT
DRAIN PENROSE 1/2X12 LTX STRL (WOUND CARE) ×2 IMPLANT
DRAPE PED LAPAROTOMY (DRAPES) ×2 IMPLANT
DRAPE UTILITY XL STRL (DRAPES) ×2 IMPLANT
DRSG TEGADERM 4X4.75 (GAUZE/BANDAGES/DRESSINGS) ×2 IMPLANT
ELECT REM PT RETURN 9FT ADLT (ELECTROSURGICAL) ×2
ELECTRODE REM PT RTRN 9FT ADLT (ELECTROSURGICAL) ×1 IMPLANT
GAUZE SPONGE 4X4 12PLY STRL LF (GAUZE/BANDAGES/DRESSINGS) ×2 IMPLANT
GLOVE BIOGEL PI IND STRL 7.0 (GLOVE) ×1 IMPLANT
GLOVE BIOGEL PI INDICATOR 7.0 (GLOVE) ×1
GLOVE ECLIPSE 6.5 STRL STRAW (GLOVE) ×2 IMPLANT
GLOVE EXAM NITRILE LRG STRL (GLOVE) ×2 IMPLANT
GLOVE SURG SIGNA 7.5 PF LTX (GLOVE) ×2 IMPLANT
GOWN PREVENTION PLUS XLARGE (GOWN DISPOSABLE) ×2 IMPLANT
GOWN PREVENTION PLUS XXLARGE (GOWN DISPOSABLE) ×2 IMPLANT
NEEDLE HYPO 22GX1.5 SAFETY (NEEDLE) IMPLANT
NEEDLE HYPO 25X1 1.5 SAFETY (NEEDLE) ×2 IMPLANT
NS IRRIG 1000ML POUR BTL (IV SOLUTION) ×2 IMPLANT
PACK BASIN DAY SURGERY FS (CUSTOM PROCEDURE TRAY) ×2 IMPLANT
PENCIL BUTTON HOLSTER BLD 10FT (ELECTRODE) ×2 IMPLANT
SLEEVE SCD COMPRESS KNEE MED (MISCELLANEOUS) ×2 IMPLANT
SPONGE INTESTINAL PEANUT (DISPOSABLE) IMPLANT
SPONGE LAP 4X18 X RAY DECT (DISPOSABLE) ×2 IMPLANT
STRIP CLOSURE SKIN 1/2X4 (GAUZE/BANDAGES/DRESSINGS) ×2 IMPLANT
SUT MNCRL AB 4-0 PS2 18 (SUTURE) ×2 IMPLANT
SUT SILK 2 0 SH (SUTURE) IMPLANT
SUT VIC AB 2-0 CT1 27 (SUTURE) ×1
SUT VIC AB 2-0 CT1 TAPERPNT 27 (SUTURE) ×1 IMPLANT
SUT VIC AB 3-0 CT1 27 (SUTURE)
SUT VIC AB 3-0 CT1 27XBRD (SUTURE) IMPLANT
SUT VIC AB 3-0 SH 27 (SUTURE) ×1
SUT VIC AB 3-0 SH 27X BRD (SUTURE) ×1 IMPLANT
SUT VICRYL AB 3 0 TIES (SUTURE) ×2 IMPLANT
SYR BULB 3OZ (MISCELLANEOUS) IMPLANT
SYR CONTROL 10ML LL (SYRINGE) ×2 IMPLANT
TOWEL OR 17X24 6PK STRL BLUE (TOWEL DISPOSABLE) ×2 IMPLANT
TOWEL OR NON WOVEN STRL DISP B (DISPOSABLE) IMPLANT

## 2013-07-20 NOTE — H&P (Signed)
[name] [name] is an 72 y.o. male.   Chief Complaint: Chronic right groin pain HPI: This gentleman has had chronic pain in his right groin after laparoscopic inguinal hernia repair in 2009. He has had multiple injections of this area. CT scans have been unremarkable. He is requesting groin exploration to see if this pain can be relieved. He is otherwise without complaints.  Past Medical History  Diagnosis Date  . Abdominal pain, generalized 08/15/2008  . Abdominal pain, unspecified site 10/16/2009  . Anal fissure 04/24/2008  . BACK PAIN 01/10/2009  . BARRETTS ESOPHAGUS 08/16/2007  . BREAST HYPERTROPHY 02/16/2009  . CARBUNCLE, NECK 02/14/2008  . CHEST PAIN 10/10/2008  . DEPRESSION 10/16/2009  . DIVERTICULOSIS, COLON 06/28/2007  . GERD 06/28/2007  . GLUCOSE INTOLERANCE 08/15/2008  . Headache(784.0) 11/13/2009  . HERNIA, UMBILICAL W/OBSTRUCTION W/O GANGRENE 06/28/2007  . HIP PAIN, RIGHT 10/16/2009  . HYPERLIPIDEMIA 06/28/2007  . HYPERTENSION 06/28/2007  . INSOMNIA-SLEEP DISORDER-UNSPEC 10/10/2008  . LEG PAIN, BILATERAL 02/16/2009  . LOW BACK PAIN 06/28/2007  . MASTITIS 01/10/2009  . NECK PAIN 05/31/2008  . NEPHROLITHIASIS, HX OF 06/28/2007  . PROCTOSIGMOIDITIS, ULCERATIVE 1997  . TESTICULAR PAIN, RIGHT 10/25/2010  . VITAMIN D DEFICIENCY 11/13/2009  . Impaired glucose tolerance 04/13/2011  . Barrett's esophagus 1997  . Arthritis   . Cataract   . PVD (peripheral vascular disease) with claudication, lifestyle limiting, ABI rt. 0.67, lt. 0.73 07/12/2012  . S/P angioplasty with stent, after DB athrectomy to Rt. ext. iliac and Rt. SFA 07/12/2012  . HTN (hypertension) 07/12/2012  . Skin cancer     right hand  . Stenosis of left carotid artery, mod to severe by dopplers 09/29/2012  . Full dentures     Past Surgical History  Procedure Laterality Date  . Left knee arthroscopy    . Bilateral inguinal hernia with mesh and umbilical hernia repairs  09/2007  . Anal fissure surgury    . Cataract extraction     bilateral  . Colonoscopy    . Atherectomy  09/28/2012  . Hernia repair      Family History  Problem Relation Age of Onset  . COPD Mother   . COPD Father    Social History:  reports that he quit smoking about 40 years ago. His smokeless tobacco use includes Chew. He reports that he drinks about 3.0 ounces of alcohol per week. He reports that he does not use illicit drugs.  Allergies:  Allergies  Allergen Reactions  . Contrast Media [Iodinated Diagnostic Agents]     No prescriptions prior to admission    Results for orders placed during the hospital encounter of 07/20/13 (from the past 48 hour(s))  BASIC METABOLIC PANEL     Status: Abnormal   Collection Time    07/18/13  9:55 AM      Result Value Range   Sodium 138  135 - 145 mEq/L   Potassium 5.0  3.5 - 5.1 mEq/L   Chloride 102  96 - 112 mEq/L   CO2 29  19 - 32 mEq/L   Glucose, Bld 120 (*) 70 - 99 mg/dL   BUN 14  6 - 23 mg/dL   Creatinine, Ser 0.88  0.50 - 1.35 mg/dL   Calcium 9.2  8.4 - 10.5 mg/dL   GFR calc non Af Amer 84 (*) >90 mL/min   GFR calc Af Amer >90  >90 mL/min   Comment: (NOTE)     The eGFR has been calculated using the CKD EPI equation.  This calculation has not been validated in all clinical situations.     eGFR's persistently <90 mL/min signify possible Chronic Kidney     Disease.   No results found.  Review of Systems  All other systems reviewed and are negative.    Height 5' 8"  (1.727 m), weight 243 lb (110.224 kg). Physical Exam  Constitutional: He is oriented to person, place, and time. No distress.  obese  HENT:  Head: Normocephalic and atraumatic.  Eyes: Conjunctivae are normal. Pupils are equal, round, and reactive to light.  Neck: Normal range of motion. Neck supple. No tracheal deviation present.  Cardiovascular: Normal rate, regular rhythm and normal heart sounds.   Respiratory: Effort normal and breath sounds normal. No respiratory distress. He has no wheezes.  GI: Soft. Bowel  sounds are normal.  No evidence of right inguinal hernia there is moderate tenderness in the right groin  Musculoskeletal: Normal range of motion. He exhibits no edema and no tenderness.  Lymphadenopathy:    He has no cervical adenopathy.  Neurological: He is alert and oriented to person, place, and time.  Skin: Skin is warm and dry. No rash noted. No erythema.  Psychiatric: His behavior is normal. Judgment normal.     Assessment/Plan Chronic right groin pain  Again, had a long discussion with the patient. He will go ahead and proceed with groin exploration to see if this can lead his discomfort. Again it is difficult to know if there is nerve entrapment or a recurrent hernia. I discussed the risks of surgery which includes not limited to bleeding, infection, injury to Surrounding structures including the testicular cord, worsening of his pain, recurrent hernia, et Ronney Asters. He understands and wishes to proceed. Surgery will be scheduled.  Contessa Preuss A 07/20/2013, 7:36 AM

## 2013-07-20 NOTE — Op Note (Signed)
GROIN EXPLORATION, EXCISIONAL BIOPSY RIGHT INGUINAL LYMPH NODES  Procedure Note  [name] [name] 07/20/2013   Pre-op Diagnosis: Chronic right groin pain      Post-op Diagnosis: same  Procedure(s): RIGHT GROIN EXPLORATION, EXCISIONAL BIOPSY RIGHT INGUINAL LYMPH NODES  Surgeon(s): Harl Bowie, MD  Anesthesia: General  Staff:  Circulator: Maurene Capes, RN Scrub Person: Eston Esters, RN  Estimated Blood Loss: Minimal               Specimens: SENT TO PATH          Coralie Keens A   Date: 07/20/2013  Time: 9:57 AM

## 2013-07-20 NOTE — Anesthesia Procedure Notes (Signed)
Procedure Name: LMA Insertion Date/Time: 07/20/2013 9:21 AM Performed by: Coralie Keens A Pre-anesthesia Checklist: Patient identified, Emergency Drugs available, Suction available and Patient being monitored Patient Re-evaluated:Patient Re-evaluated prior to inductionOxygen Delivery Method: Circle System Utilized Preoxygenation: Pre-oxygenation with 100% oxygen Intubation Type: IV induction Ventilation: Mask ventilation without difficulty LMA: LMA inserted LMA Size: 5.0 Number of attempts: 1 Airway Equipment and Method: bite block Placement Confirmation: positive ETCO2 Tube secured with: Tape Dental Injury: Teeth and Oropharynx as per pre-operative assessment

## 2013-07-20 NOTE — Anesthesia Postprocedure Evaluation (Signed)
Anesthesia Post Note  Patient: [name] [name]  Procedure(s) Performed: Procedure(s) (LRB): GROIN EXPLORATION, EXCISIONAL BIOPSY RIGHT INGUINAL LYMPH NODES (Right)  Anesthesia type: General  Patient location: PACU  Post pain: Pain level controlled and Adequate analgesia  Post assessment: Post-op Vital signs reviewed, Patient's Cardiovascular Status Stable, Respiratory Function Stable, Patent Airway and Pain level controlled  Last Vitals:  Filed Vitals:   07/20/13 1045  BP: 133/65  Pulse: 85  Temp:   Resp: 22    Post vital signs: Reviewed and stable  Level of consciousness: awake, alert  and oriented  Complications: No apparent anesthesia complications

## 2013-07-20 NOTE — Transfer of Care (Signed)
Immediate Anesthesia Transfer of Care Note  Patient: [name] [name]  Procedure(s) Performed: Procedure(s): GROIN EXPLORATION, EXCISIONAL BIOPSY RIGHT INGUINAL LYMPH NODES (Right)  Patient Location: PACU  Anesthesia Type:General  Level of Consciousness: awake and patient cooperative  Airway & Oxygen Therapy: Patient Spontanous Breathing and Patient connected to face mask oxygen  Post-op Assessment: Report given to PACU RN and Post -op Vital signs reviewed and stable  Post vital signs: Reviewed and stable  Complications: No apparent anesthesia complications

## 2013-07-20 NOTE — Anesthesia Preprocedure Evaluation (Addendum)
Anesthesia Evaluation  Patient identified by MRN, date of birth, ID band Patient awake    Reviewed: Allergy & Precautions, NPO status   Airway Mallampati: II  Neck ROM: full    Dental   Pulmonary former smoker,          Cardiovascular hypertension, Pt. on medications + Peripheral Vascular Disease     Neuro/Psych  Headaches, PSYCHIATRIC DISORDERS Depression    GI/Hepatic PUD, GERD-  Medicated,  Endo/Other  obese  Renal/GU      Musculoskeletal   Abdominal   Peds  Hematology   Anesthesia Other Findings   Reproductive/Obstetrics                          Anesthesia Physical Anesthesia Plan  ASA: III  Anesthesia Plan: General   Post-op Pain Management:    Induction: Intravenous  Airway Management Planned: LMA  Additional Equipment:   Intra-op Plan:   Post-operative Plan:   Informed Consent: I have reviewed the patients History and Physical, chart, labs and discussed the procedure including the risks, benefits and alternatives for the proposed anesthesia with the patient or authorized representative who has indicated his/her understanding and acceptance.     Plan Discussed with: CRNA, Anesthesiologist and Surgeon  Anesthesia Plan Comments:         Anesthesia Quick Evaluation

## 2013-07-21 ENCOUNTER — Encounter (HOSPITAL_BASED_OUTPATIENT_CLINIC_OR_DEPARTMENT_OTHER): Payer: Self-pay | Admitting: Surgery

## 2013-07-22 NOTE — Op Note (Signed)
NAMEKODY, BRANDL NO.:  [id number]  MEDICAL RECORD NO.:  [id number]  LOCATION:                                 FACILITY:  PHYSICIAN:  Coralie Keens, M.D. DATE OF BIRTH:  1941-07-14  DATE OF PROCEDURE:  07/20/2013 DATE OF DISCHARGE:  07/20/2013                              OPERATIVE REPORT   PREOPERATIVE DIAGNOSIS:  Chronic right groin pain.  POSTOPERATIVE DIAGNOSIS:  Chronic right groin pain.  PROCEDURES: 1. Right groin exploration. 2. Excisional biopsy of right inguinal lymph node.  SURGEON:  Coralie Keens, M.D.  ANESTHESIA:  General and 0.5% Marcaine.  ESTIMATED BLOOD LOSS:  Minimal.  INDICATIONS:  This is a 72 year old gentleman, who had undergone bilateral laparoscopic inguinal hernia repair with mesh in 2009.  Since then, he has had chronic right groin pain.  X-rays have been inconclusive.  Because of discomfort and after long discussion, he wishes to proceed with groin exploration.  FINDINGS:  There was no evidence of recurrent hernia.  He had an area of adenopathy with several matted lymph nodes in the inguinal area below the inguinal ligament.  I excised these lymph nodes.  I also identified what appeared to be the ilioinguinal nerve and transected this to see if it would help with his discomfort.  PROCEDURE IN DETAIL:  The patient was brought to the operating room, identified as [name].  He was placed supine on the operating room table and general anesthesia was induced.  His abdomen was then prepped and draped in usual sterile fashion.  I performed an ilioinguinal nerve block and anesthetized the skin as well with Marcaine.  I made a longitudinal incision with the scalpel in the right groin and took this down through the Scarpa fascia with electrocautery.  The external oblique fascia was then identified and opened towards the internal and external rings.  I controlled the testicular cord structures with the Penrose drain.   The patient's groin was thoroughly examined.  I could find no evidence of recurrent inguinal hernia in either direct or indirect area.  The laparoscopic mesh appeared to be appropriately in place.  I identified what appeared to be an ilioinguinal nerve and transected this nerve to see if nerve entrapment was the cause of his discomfort.  The patient had an area of adenopathy below the inguinal ligament with several matted lymph nodes in this area.  I excised several liters of lymph nodes with electrocautery and sent to pathology for evaluation to see if this was the source of his discomfort.  This appeared inflamed in the area.  At this point, I anesthetized the area further with Marcaine.  I closed the external oblique fascia with a running 2-0 Vicryl suture.  I then closed the Scarpa fascia with interrupted 3-0 Vicryl sutures to close the skin with a running 4-0 Monocryl.  Steri-Strips, gauze, and tape were then applied.  The patient tolerated the procedure well.  All other counts were correct at the end of procedure.  The patient was then extubated in the operating room and taken in stable condition to the recovery room.     Coralie Keens, M.D.   ______________________________ Coralie Keens, M.D.  DB/MEDQ  D:  07/20/2013  T:  07/21/2013  Job:  [id number]

## 2013-08-01 ENCOUNTER — Encounter (INDEPENDENT_AMBULATORY_CARE_PROVIDER_SITE_OTHER): Payer: Self-pay | Admitting: General Surgery

## 2013-08-01 ENCOUNTER — Ambulatory Visit (INDEPENDENT_AMBULATORY_CARE_PROVIDER_SITE_OTHER): Payer: Medicare Other | Admitting: General Surgery

## 2013-08-01 ENCOUNTER — Telehealth (INDEPENDENT_AMBULATORY_CARE_PROVIDER_SITE_OTHER): Payer: Self-pay

## 2013-08-01 VITALS — BP 130/68 | HR 88 | Temp 98.2°F | Resp 15 | Ht 68.0 in | Wt 242.2 lb

## 2013-08-01 DIAGNOSIS — R109 Unspecified abdominal pain: Secondary | ICD-10-CM

## 2013-08-01 NOTE — Telephone Encounter (Signed)
The pt c/o swelling at his incision site that is the size of a football.  He has been icing the area and it doesn't help.  It is tender.  There is no redness.  He has no fever.  I want to bring him in today.  I saw Dr Ninfa Linden is seeing another patient and I told him I will see if Dr Ninfa Linden can see him or if he needs to come to urgent office.

## 2013-08-01 NOTE — Telephone Encounter (Signed)
If he can't wait till tomorrow, have him come to urg.  I am going to need to move my patient I'm seeing today in the office up to 2 o'clock today.  I can only see her because of an emergent case I'm going to have to do at [facility] this afternoon

## 2013-08-01 NOTE — Patient Instructions (Signed)
No heavy lifting

## 2013-08-01 NOTE — Progress Notes (Signed)
Subjective:     Patient ID: [name] [name], male   DOB: 12-Dec-1940, 72 y.o.   MRN: [id number]  HPI The patient is a 72 year old white male who is about 12 days status post right groin exploration and lymph node biopsy. He has developed some swelling in the right groin after surgery. He denies any significant pain. His appetite is good and his bowels are working normally. He denies any fevers or chills.  Review of Systems     Objective:   Physical Exam On exam the right groin incision is healing nicely with no sign of infection. He appears to have a normal healing ridge.    Assessment:     The patient is 12 days status post right groin exploration     Plan:     At this point I think the swelling that is there is normal. This will take a couple months to gradually resolve and flatten. He should avoid any heavy lifting. He will followup with Dr. Ninfa Linden in the next week or 2

## 2013-08-09 ENCOUNTER — Ambulatory Visit (INDEPENDENT_AMBULATORY_CARE_PROVIDER_SITE_OTHER): Payer: Medicare Other | Admitting: Surgery

## 2013-08-09 ENCOUNTER — Encounter (INDEPENDENT_AMBULATORY_CARE_PROVIDER_SITE_OTHER): Payer: Self-pay | Admitting: Surgery

## 2013-08-09 VITALS — BP 140/70 | HR 92 | Temp 98.9°F | Resp 15 | Ht 68.0 in | Wt 242.8 lb

## 2013-08-09 DIAGNOSIS — Z09 Encounter for follow-up examination after completed treatment for conditions other than malignant neoplasm: Secondary | ICD-10-CM

## 2013-08-09 NOTE — Progress Notes (Signed)
Subjective:     Patient ID: [name] [name], male   DOB: [birth year], 72 y.o.   MRN: [id number]  HPI He is here for another postop visit. He reports that his preoperative discomfort has almost resolved.  Review of Systems     Objective:   Physical Exam On exam, there is swelling in the right groin from previous surgery but no other abnormalities  I believe the discomfort was from enlarged lymph nodes. The nodes removed showed no evidence of malignancy.    Assessment:     Patient stable postop      Plan:     I explained the operative results to him. He may resume his normal activity. I suspect the swelling will take several more months to resolve. I will see him back as needed

## 2013-08-26 ENCOUNTER — Encounter: Payer: Self-pay | Admitting: Cardiovascular Disease

## 2013-09-13 ENCOUNTER — Encounter: Payer: Self-pay | Admitting: Gastroenterology

## 2013-09-26 ENCOUNTER — Encounter: Payer: Self-pay | Admitting: Gastroenterology

## 2013-10-11 ENCOUNTER — Telehealth: Payer: Self-pay

## 2013-10-11 NOTE — Telephone Encounter (Signed)
PCP received report from Dr. Katy Fitch stating from exam at his office this morning (10/11/13) may have had a small brain stem stroke. Called PCP to schedule appt. Asap.  Patient did agree to do so.

## 2013-10-13 ENCOUNTER — Ambulatory Visit (INDEPENDENT_AMBULATORY_CARE_PROVIDER_SITE_OTHER): Payer: Medicare Other | Admitting: Internal Medicine

## 2013-10-13 ENCOUNTER — Encounter: Payer: Self-pay | Admitting: Internal Medicine

## 2013-10-13 VITALS — BP 130/60 | HR 77 | Temp 98.4°F | Wt 240.4 lb

## 2013-10-13 DIAGNOSIS — Z0000 Encounter for general adult medical examination without abnormal findings: Secondary | ICD-10-CM

## 2013-10-13 DIAGNOSIS — Z23 Encounter for immunization: Secondary | ICD-10-CM

## 2013-10-13 DIAGNOSIS — R7309 Other abnormal glucose: Secondary | ICD-10-CM

## 2013-10-13 DIAGNOSIS — H532 Diplopia: Secondary | ICD-10-CM

## 2013-10-13 DIAGNOSIS — R7302 Impaired glucose tolerance (oral): Secondary | ICD-10-CM

## 2013-10-13 DIAGNOSIS — E162 Hypoglycemia, unspecified: Secondary | ICD-10-CM | POA: Insufficient documentation

## 2013-10-13 MED ORDER — DIAZEPAM 5 MG PO TABS: ORAL_TABLET | ORAL | Status: DC

## 2013-10-13 MED ORDER — ZOLPIDEM TARTRATE 10 MG PO TABS: 10.0000 mg | ORAL_TABLET | Freq: Every evening | ORAL | Status: DC | PRN

## 2013-10-13 NOTE — Assessment & Plan Note (Signed)
stable overall by history and exam, recent data reviewed with pt, and pt to continue medical treatment as before,  to f/u any worsening symptoms or concerns Lab Results  Component Value Date   HGBA1C 5.8 10/18/2012   For f/u lab

## 2013-10-13 NOTE — Progress Notes (Signed)
Pre-visit discussion using our clinic review tool. No additional management support is needed unless otherwise documented below in the visit note.  

## 2013-10-13 NOTE — Assessment & Plan Note (Signed)

## 2013-10-13 NOTE — Patient Instructions (Signed)
You had the new Prevnar pneumonia shot today  Please take all new medication as prescribed - the valium 1 pill only (1 time) with your coming MRI  Please continue all other medications as before, and refills have been done if requested. Please have the pharmacy call with any other refills you may need.  You will be contacted regarding the referral for: MRI for head, and neurology consult  Please keep your appointments with your specialists as you have planned   Please go to the LAB in the Basement (turn left off the elevator) for the tests to be done tomorrow  You will be contacted by phone if any changes need to be made immediately.  Otherwise, you will receive a letter about your results with an explanation, but please check with MyChart first.  Please remember to sign up for MyChart if you have not done so, as this will be important to you in the future with finding out test results, communicating by private email, and scheduling acute appointments online when needed.  Please return in 3 months, or sooner if needed

## 2013-10-13 NOTE — Assessment & Plan Note (Signed)
Unclear etiology, no obvious opthalmoplegia, for MRI brain (open MRI, with valium due to claustrophobia), and refer neurology

## 2013-10-13 NOTE — Progress Notes (Signed)
Subjective:    Patient ID: [name] [name], male    DOB: Jan 07, 1941, 73 y.o.   MRN: [id number]  HPI  Here for wellness and f/u;  Overall doing ok;  Pt denies CP, worsening SOB, DOE, wheezing, orthopnea, PND, worsening LE edema, palpitations, dizziness or syncope.  Pt denies neurological change such as new headache, facial or extremity weakness.  Pt denies polydipsia, polyuria, or low sugar symptoms. Pt states overall good compliance with treatment and medications, good tolerability, and has been trying to follow lower cholesterol diet.  Pt denies worsening depressive symptoms, suicidal ideation or panic. No fever, night sweats, wt loss, loss of appetite, or other constitutional symptoms.  Pt states good ability with ADL's, has low fall risk, home safety reviewed and adequate, no other significant changes in hearing or vision, and only occasionally active with exercise.  Also has 3 wks onset double vision with lying on left side, and looking upwards, saw opthomologist who suggested eval for ? Cva.  Due for prevnar Past Medical History  Diagnosis Date  . Abdominal pain, generalized 08/15/2008  . Abdominal pain, unspecified site 10/16/2009  . Anal fissure 04/24/2008  . BACK PAIN 01/10/2009  . BARRETTS ESOPHAGUS 08/16/2007  . BREAST HYPERTROPHY 02/16/2009  . CARBUNCLE, NECK 02/14/2008  . CHEST PAIN 10/10/2008  . DEPRESSION 10/16/2009  . DIVERTICULOSIS, COLON 06/28/2007  . GERD 06/28/2007  . GLUCOSE INTOLERANCE 08/15/2008  . Headache(784.0) 11/13/2009  . HERNIA, UMBILICAL W/OBSTRUCTION W/O GANGRENE 06/28/2007  . HIP PAIN, RIGHT 10/16/2009  . HYPERLIPIDEMIA 06/28/2007  . HYPERTENSION 06/28/2007  . INSOMNIA-SLEEP DISORDER-UNSPEC 10/10/2008  . LEG PAIN, BILATERAL 02/16/2009  . LOW BACK PAIN 06/28/2007  . MASTITIS 01/10/2009  . NECK PAIN 05/31/2008  . NEPHROLITHIASIS, HX OF 06/28/2007  . PROCTOSIGMOIDITIS, ULCERATIVE 1997  . TESTICULAR PAIN, RIGHT 10/25/2010  . VITAMIN D DEFICIENCY 11/13/2009  . Impaired glucose  tolerance 04/13/2011  . Barrett's esophagus 1997  . Arthritis   . Cataract   . PVD (peripheral vascular disease) with claudication, lifestyle limiting, ABI rt. 0.67, lt. 0.73 07/12/2012  . S/P angioplasty with stent, after DB athrectomy to Rt. ext. iliac and Rt. SFA 07/12/2012  . HTN (hypertension) 07/12/2012  . Skin cancer     right hand  . Stenosis of left carotid artery, mod to severe by dopplers 09/29/2012  . Full dentures    Past Surgical History  Procedure Laterality Date  . Left knee arthroscopy    . Bilateral inguinal hernia with mesh and umbilical hernia repairs  09/2007  . Anal fissure surgury    . Cataract extraction      bilateral  . Colonoscopy    . Atherectomy  09/28/2012  . Hernia repair    . Groin dissection Right 07/20/2013    Procedure: GROIN EXPLORATION, EXCISIONAL BIOPSY RIGHT INGUINAL LYMPH NODES;  Surgeon: Harl Bowie, MD;  Location: [facility];  Service: General;  Laterality: Right;    reports that he quit smoking about 41 years ago. His smokeless tobacco use includes Chew. He reports that he drinks about 3.0 ounces of alcohol per week. He reports that he does not use illicit drugs. family history includes COPD in his father and mother. Allergies  Allergen Reactions  . Contrast Media [Iodinated Diagnostic Agents]    Current Outpatient Prescriptions on File Prior to Visit  Medication Sig Dispense Refill  . aspirin EC 81 MG EC tablet Take 1 tablet (81 mg total) by mouth daily.  30 tablet    . clopidogrel (  PLAVIX) 75 MG tablet Take 1 tablet (75 mg total) by mouth daily.  90 tablet  3  . lisinopril-hydrochlorothiazide (PRINZIDE,ZESTORETIC) 20-25 MG per tablet Take 1 tablet by mouth daily.  90 tablet  3  . pantoprazole (PROTONIX) 40 MG tablet Take 1 tablet (40 mg total) by mouth daily.  90 tablet  3  . sulfaSALAzine (AZULFIDINE) 500 MG tablet Take 2 tablets (1,000 mg total) by mouth 2 (two) times daily.  360 tablet  1  . tamsulosin (FLOMAX) 0.4  MG CAPS Take 0.4 mg by mouth as needed.        No current facility-administered medications on file prior to visit.   Review of Systems Constitutional: Negative for diaphoresis, activity change, appetite change or unexpected weight change.  HENT: Negative for hearing loss, ear pain, facial swelling, mouth sores and neck stiffness.   Eyes: Negative for pain, redness and visual disturbance.  Respiratory: Negative for shortness of breath and wheezing.   Cardiovascular: Negative for chest pain and palpitations.  Gastrointestinal: Negative for diarrhea, blood in stool, abdominal distention or other pain Genitourinary: Negative for hematuria, flank pain or change in urine volume.  Musculoskeletal: Negative for myalgias and joint swelling.  Skin: Negative for color change and wound.  Neurological: Negative for syncope and numbness. other than noted Hematological: Negative for adenopathy.  Psychiatric/Behavioral: Negative for hallucinations, self-injury, decreased concentration and agitation.      Objective:   Physical Exam BP 130/60  Pulse 77  Temp(Src) 98.4 F (36.9 C) (Oral)  Wt 240 lb 6 oz (109.033 kg)  SpO2 96% VS noted,  Constitutional: Pt is oriented to person, place, and time. Appears well-developed and well-nourished.  Head: Normocephalic and atraumatic.  Right Ear: External ear normal.  Left Ear: External ear normal.  Nose: Nose normal.  Mouth/Throat: Oropharynx is clear and moist.  Eyes: Conjunctivae and EOM are normal. Pupils are equal, round, and reactive to light.  Neck: Normal range of motion. Neck supple. No JVD present. No tracheal deviation present.  Cardiovascular: Normal rate, regular rhythm, normal heart sounds and intact distal pulses.   Pulmonary/Chest: Effort normal and breath sounds normal.  Abdominal: Soft. Bowel sounds are normal. There is no tenderness. No HSM  Musculoskeletal: Normal range of motion. Exhibits no edema.  Lymphadenopathy:  Has no cervical  adenopathy.  Neurological: Pt is alert and oriented to person, place, and time. Pt has normal reflexes. No cranial nerve deficit. Motor 5/5 Skin: Skin is warm and dry. No rash noted.  Psychiatric:  Has  normal mood and affect. Behavior is normal.     Assessment & Plan:

## 2013-10-14 ENCOUNTER — Ambulatory Visit (INDEPENDENT_AMBULATORY_CARE_PROVIDER_SITE_OTHER): Payer: Medicare Other

## 2013-10-14 ENCOUNTER — Telehealth: Payer: Self-pay | Admitting: Gastroenterology

## 2013-10-14 ENCOUNTER — Other Ambulatory Visit: Payer: Self-pay | Admitting: *Deleted

## 2013-10-14 ENCOUNTER — Encounter: Payer: Self-pay | Admitting: Physician Assistant

## 2013-10-14 ENCOUNTER — Ambulatory Visit (INDEPENDENT_AMBULATORY_CARE_PROVIDER_SITE_OTHER): Payer: Medicare Other | Admitting: Physician Assistant

## 2013-10-14 ENCOUNTER — Telehealth: Payer: Self-pay | Admitting: *Deleted

## 2013-10-14 VITALS — BP 144/66 | HR 78 | Ht 68.0 in | Wt 241.0 lb

## 2013-10-14 DIAGNOSIS — R7309 Other abnormal glucose: Secondary | ICD-10-CM

## 2013-10-14 DIAGNOSIS — K5190 Ulcerative colitis, unspecified, without complications: Secondary | ICD-10-CM

## 2013-10-14 DIAGNOSIS — E785 Hyperlipidemia, unspecified: Secondary | ICD-10-CM

## 2013-10-14 DIAGNOSIS — K2270 Barrett's esophagus without dysplasia: Secondary | ICD-10-CM

## 2013-10-14 DIAGNOSIS — I10 Essential (primary) hypertension: Secondary | ICD-10-CM

## 2013-10-14 DIAGNOSIS — Z0000 Encounter for general adult medical examination without abnormal findings: Secondary | ICD-10-CM

## 2013-10-14 DIAGNOSIS — Z125 Encounter for screening for malignant neoplasm of prostate: Secondary | ICD-10-CM

## 2013-10-14 DIAGNOSIS — R7302 Impaired glucose tolerance (oral): Secondary | ICD-10-CM

## 2013-10-14 LAB — CBC WITH DIFFERENTIAL/PLATELET
BASOS PCT: 0.5 % (ref 0.0–3.0)
Basophils Absolute: 0 10*3/uL (ref 0.0–0.1)
EOS ABS: 0.2 10*3/uL (ref 0.0–0.7)
EOS PCT: 2.9 % (ref 0.0–5.0)
HEMATOCRIT: 48 % (ref 39.0–52.0)
HEMOGLOBIN: 16.1 g/dL (ref 13.0–17.0)
LYMPHS ABS: 0.9 10*3/uL (ref 0.7–4.0)
Lymphocytes Relative: 10.9 % — ABNORMAL LOW (ref 12.0–46.0)
MCHC: 33.5 g/dL (ref 30.0–36.0)
MCV: 92.3 fl (ref 78.0–100.0)
MONO ABS: 0.8 10*3/uL (ref 0.1–1.0)
Monocytes Relative: 10.2 % (ref 3.0–12.0)
NEUTROS ABS: 6.2 10*3/uL (ref 1.4–7.7)
Neutrophils Relative %: 75.5 % (ref 43.0–77.0)
Platelets: 211 10*3/uL (ref 150.0–400.0)
RBC: 5.2 Mil/uL (ref 4.22–5.81)
RDW: 14.7 % — ABNORMAL HIGH (ref 11.5–14.6)
WBC: 8.2 10*3/uL (ref 4.5–10.5)

## 2013-10-14 LAB — URINALYSIS, ROUTINE W REFLEX MICROSCOPIC
BILIRUBIN URINE: NEGATIVE
HGB URINE DIPSTICK: NEGATIVE
Leukocytes, UA: NEGATIVE
NITRITE: NEGATIVE
PH: 6.5 (ref 5.0–8.0)
Specific Gravity, Urine: 1.015 (ref 1.000–1.030)
Total Protein, Urine: NEGATIVE
URINE GLUCOSE: NEGATIVE
Urobilinogen, UA: 0.2 (ref 0.0–1.0)

## 2013-10-14 LAB — TSH: TSH: 1.41 u[IU]/mL (ref 0.35–5.50)

## 2013-10-14 LAB — HEMOGLOBIN A1C: Hgb A1c MFr Bld: 5.6 % (ref 4.6–6.5)

## 2013-10-14 LAB — PSA: PSA: 1.29 ng/mL (ref 0.10–4.00)

## 2013-10-14 MED ORDER — MOVIPREP 100 G PO SOLR: 1.0000 | ORAL | Status: DC

## 2013-10-14 NOTE — Telephone Encounter (Signed)
I called the daughter to advise that her father had not yet signed a designated party release. I explained what that was and told her that when he comes for his procedures on 12-02-2013 he and she would have to sign that. That would enable [place] to answer her questions when she calls. According to her, he does get confused sometimes.  She thanked me for the information.

## 2013-10-14 NOTE — Patient Instructions (Addendum)
You have been scheduled for an endoscopy and colonoscopy. If you use inhalers (even only as needed), please bring them with you on the day of your procedure. We will contact you once we hear back from Dr. Quay Burow regarding the Plavix.  We will send the prescription for 1 year refills for Azulfidine to [facility], [facility].  The colonoscopy prep prescription was sent to [place].

## 2013-10-14 NOTE — Telephone Encounter (Signed)
10/14/2013   RE: [name] [name] DOB: [birth year] MRN: [id number]   Dear Dr. Quay Burow,    We have scheduled the above patient for an endoscopic procedure. Our records show that he is on anticoagulation therapy.   Please advise as to how long the patient may come off his therapy of Plavix prior to the procedure, which is scheduled for 12-02-2013.  Please fax back/ or route the completed form to [facility] at [phone].   Sincerely,    Amy Esterwood PA-C    Marisue Humble CMA

## 2013-10-14 NOTE — Progress Notes (Signed)
Reviewed and agree with management plan.  Kreg Earhart T. Cayci Mcnabb, MD FACG 

## 2013-10-14 NOTE — Progress Notes (Signed)
Subjective:    Patient ID: [name] [name], male    DOB: May 30, 1941, 73 y.o.   MRN: [id number]  HPI  [name] is a pleasant 73 year old white male known to Dr. Fuller Plan. He has history of universal ulcerative colitis, colon polyps and Barrett's esophagus. He is maintained on chronic Plavix and followed by Dr. Quay Burow from a cardiac standpoint. He has history of peripheral vascular disease and is status post lower extremity stenting, with last procedure done in January of 2014. Patient comes in today to discuss 73 year old white male known to Dr. Fuller Plan. He has history of universal ulcerative colitis, colon polyps and Barrett's esophagus. He is maintained on chronic Plavix and followed by Dr. Quay Burow from a cardiac standpoint. He has history of peripheral vascular disease and is status post lower extremity stenting, with last procedure done in January of 2014. Patient comes in today to discuss followup EGD and colonoscopy. Currently has no active GI symptoms. He says he does have some colitis symptoms periodically with blood noted in his stool for 2-3 days in a row he has no current complaints of bleeding no abdominal pain or cramping and bowel movements are normal. He denies any dysphagia odynophagia or heartburn. Last colonoscopy June of 2013 showed mild left-sided diverticulosis he had 2 polyps removed and was noted to have mild colitis in the rectum and sigmoid. Polyps were tubular adenomas with low-grade dysplasia mildly active chronic colitis from left colon biopsies. Last EGD was done in February 2012 has a 1 cm segment of Barrett's and mild gastritis and was recommended for 3 year followup. Biopsies at that time did not show any intestinal metaplasia however prior biopsies were consistent with Barrett's.    Review of Systems  Constitutional: Negative.   HENT: Negative.   Eyes: Negative.   Respiratory: Negative.   Cardiovascular: Negative.   Gastrointestinal: Negative.   Endocrine: Negative.   Genitourinary: Negative.   Musculoskeletal: Negative.   Skin: Negative.   Allergic/Immunologic: Negative.   Neurological: Negative.   Hematological: Negative.   Psychiatric/Behavioral: Negative.    Outpatient Prescriptions Prior to Visit  Medication Sig Dispense Refill  . aspirin EC 81 MG EC tablet Take 1 tablet (81 mg total) by mouth daily.  30 tablet    . clopidogrel  (PLAVIX) 75 MG tablet Take 1 tablet (75 mg total) by mouth daily.  90 tablet  3  . diazepam (VALIUM) 5 MG tablet Take 1 tab by mouth 45 min before your MRI  1 tablet  0  . lisinopril-hydrochlorothiazide (PRINZIDE,ZESTORETIC) 20-25 MG per tablet Take 1 tablet by mouth daily.  90 tablet  3  . pantoprazole (PROTONIX) 40 MG tablet Take 1 tablet (40 mg total) by mouth daily.  90 tablet  3  . sulfaSALAzine (AZULFIDINE) 500 MG tablet Take 2 tablets (1,000 mg total) by mouth 2 (two) times daily.  360 tablet  1  . tamsulosin (FLOMAX) 0.4 MG CAPS Take 0.4 mg by mouth as needed.       . zolpidem (AMBIEN) 10 MG tablet Take 1 tablet (10 mg total) by mouth at bedtime as needed for sleep.  30 tablet  5  . atorvastatin (LIPITOR) 20 MG tablet Take 20 mg by mouth at bedtime.      [name] HYDROcodone-acetaminophen (NORCO) 5-325 MG per tablet Take 1-2 tablets by mouth every 4 (four) hours as needed for moderate pain.  30 tablet  0  . zolpidem (AMBIEN) 10 MG tablet Take 1 tablet (10 mg total) by mouth at bedtime as needed for sleep.  30 tablet  5   No facility-administered medications prior to visit.   Allergies  Allergen Reactions  . Contrast Media [Iodinated Diagnostic Agents]        Patient Active Problem List   Diagnosis Date Noted  . Double vision with both eyes open 10/13/2013  . Stenosis of left  carotid artery, mod to severe by dopplers 09/29/2012  . S/P angioplasty with stent, and [place] athrectomy mid lt. SFA for life style limiting claudication 09/28/12 09/29/2012  . PVD (peripheral vascular disease) with claudication, lifestyle limiting, ABI rt. 0.67, lt. 0.73 07/12/2012  . Thromboembolism, distal  Rt SFA  treated with aspiration thrombectomy, 07/12/12 07/12/2012  . S/P insertion of iliac artery stent, external iliac and RT SFA  with DB athrectomy 07/12/12 07/12/2012  . Impaired glucose tolerance 04/13/2011  . Preventative health care 04/13/2011  . UNIVERSAL ULCERATIVE COLITIS 11/14/2009  . VITAMIN D  DEFICIENCY 11/13/2009  . DEPRESSION 10/16/2009  . SHOULDER PAIN, LEFT 10/16/2009  . HIP PAIN, RIGHT 10/16/2009  . BREAST HYPERTROPHY 02/16/2009  . BACK PAIN 01/10/2009  . INSOMNIA-SLEEP DISORDER-UNSPEC 10/10/2008  . CHEST PAIN 10/10/2008  . NECK PAIN 05/31/2008  . Hx of ulcerative colitis 04/24/2008  . Anal fissure 04/24/2008  . Personal history of colonic polyps 04/20/2008  . CARBUNCLE, NECK 02/14/2008  . BARRETTS ESOPHAGUS 08/16/2007  . HYPERLIPIDEMIA 06/28/2007  . HYPERTENSION 06/28/2007  . GERD 06/28/2007  . PROCTOSIGMOIDITIS, ULCERATIVE 06/28/2007  . DIVERTICULOSIS, COLON 06/28/2007  . LOW BACK PAIN 06/28/2007  . NEPHROLITHIASIS, HX OF 06/28/2007   History  Substance Use Topics  . Smoking status: Former Smoker    Quit date: 09/08/1972  . Smokeless tobacco: Current User    Types: Chew  . Alcohol Use: 3.0 oz/week    5 Cans of beer per week     Comment: 1 beer   family history includes COPD in his father and mother.  Objective:   Physical Exam well-developed older white male in no acute distress, pleasant blood pressure 144/66 pulse 78 height 5 foot 8 weight 241. HEENT; nontraumatic normocephalic EOMI PERRLA sclera anicteric, Supple; no JVD, Cardiovascular ;regular rate and rhythm with S1-S2 no murmur or gallop, Pulmonary; clear bilaterally, Abdomen; large soft nondistended nontender no palpable mass or hepatosplenomegaly, Rectal exam not done, Ext; no clubbing cyanosis or edema skin warm and dry, Psych; mood and affect appropriate        Assessment & Plan:  #48 73 year old male with long history of universal ulcerative colitis and history of adenomatous colon polyps due for two-year interval followup colonoscopy no current active symptoms #2 history of Barrett's esophagus-last EGD February 2012 #3 chronic antiplatelet therapy with Plavix #4 peripheral vascular disease status post lower extremity angioplasties and stents #5 hypertension #6 hyperlipidemia  Plan;  continue sulfasalazine 1000 mg twice daily-refills sent Continue Protonix 40 mg by mouth every morning Schedule for colonoscopy and EGD with Dr. Josiah Lobo discussed in detail with patient and he is agreeable to proceed Patient will need to hold Plavix for 5 days prior to the procedures and will obtain consent from Dr. Quay Burow.

## 2013-10-17 LAB — HEPATIC FUNCTION PANEL
ALT: 18 U/L (ref 0–53)
AST: 19 U/L (ref 0–37)
Albumin: 4.2 g/dL (ref 3.5–5.2)
Alkaline Phosphatase: 55 U/L (ref 39–117)
BILIRUBIN DIRECT: 0.1 mg/dL (ref 0.0–0.3)
BILIRUBIN TOTAL: 0.5 mg/dL (ref 0.3–1.2)
Total Protein: 6.7 g/dL (ref 6.0–8.3)

## 2013-10-17 LAB — LIPID PANEL
CHOLESTEROL: 236 mg/dL — AB (ref 0–200)
HDL: 30.4 mg/dL — AB (ref >39.00)
Total CHOL/HDL Ratio: 8
Triglycerides: 209 mg/dL — ABNORMAL HIGH (ref 0.0–149.0)
VLDL: 41.8 mg/dL — AB (ref 0.0–40.0)

## 2013-10-17 LAB — BASIC METABOLIC PANEL
BUN: 16 mg/dL (ref 6–23)
CO2: 30 mEq/L (ref 19–32)
CREATININE: 1.1 mg/dL (ref 0.4–1.5)
Calcium: 9.4 mg/dL (ref 8.4–10.5)
Chloride: 101 mEq/L (ref 96–112)
GFR: 70.61 mL/min (ref >60.00)
Glucose, Bld: 104 mg/dL — ABNORMAL HIGH (ref 70–99)
Potassium: 4.8 mEq/L (ref 3.5–5.1)
Sodium: 136 mEq/L (ref 135–145)

## 2013-10-17 LAB — LDL CHOLESTEROL, DIRECT: LDL DIRECT: 184.7 mg/dL

## 2013-10-18 ENCOUNTER — Ambulatory Visit (HOSPITAL_COMMUNITY)
Admission: RE | Admit: 2013-10-18 | Discharge: 2013-10-18 | Disposition: A | Discharge status: Home / Self Care | Payer: Medicare Other | Source: Ambulatory Visit | Attending: Cardiovascular Disease | Admitting: Cardiovascular Disease

## 2013-10-18 ENCOUNTER — Encounter (HOSPITAL_COMMUNITY): Payer: Medicare Other

## 2013-10-18 ENCOUNTER — Encounter: Payer: Self-pay | Admitting: Internal Medicine

## 2013-10-18 ENCOUNTER — Other Ambulatory Visit: Payer: Self-pay | Admitting: Internal Medicine

## 2013-10-18 DIAGNOSIS — I739 Peripheral vascular disease, unspecified: Secondary | ICD-10-CM

## 2013-10-18 DIAGNOSIS — Z9889 Other specified postprocedural states: Secondary | ICD-10-CM | POA: Insufficient documentation

## 2013-10-18 DIAGNOSIS — E785 Hyperlipidemia, unspecified: Secondary | ICD-10-CM | POA: Insufficient documentation

## 2013-10-18 DIAGNOSIS — I70209 Unspecified atherosclerosis of native arteries of extremities, unspecified extremity: Secondary | ICD-10-CM | POA: Insufficient documentation

## 2013-10-18 DIAGNOSIS — I10 Essential (primary) hypertension: Secondary | ICD-10-CM | POA: Insufficient documentation

## 2013-10-18 MED ORDER — ATORVASTATIN CALCIUM 20 MG PO TABS: 20.0000 mg | ORAL_TABLET | Freq: Every day | ORAL | Status: DC

## 2013-10-18 NOTE — Progress Notes (Signed)
Lower Extremity Arterial Duplex Completed. °Brianna L Mazza,RVT °

## 2013-10-19 ENCOUNTER — Telehealth: Payer: Self-pay | Admitting: Internal Medicine

## 2013-10-19 ENCOUNTER — Telehealth: Payer: Self-pay

## 2013-10-19 ENCOUNTER — Ambulatory Visit (INDEPENDENT_AMBULATORY_CARE_PROVIDER_SITE_OTHER): Payer: Medicare Other | Admitting: Neurology

## 2013-10-19 ENCOUNTER — Encounter: Payer: Self-pay | Admitting: Neurology

## 2013-10-19 VITALS — BP 140/70 | HR 74 | Temp 98.1°F | Ht 68.0 in | Wt 242.0 lb

## 2013-10-19 DIAGNOSIS — H532 Diplopia: Secondary | ICD-10-CM

## 2013-10-19 DIAGNOSIS — R51 Headache: Secondary | ICD-10-CM

## 2013-10-19 MED ORDER — LOVASTATIN 40 MG PO TABS: 40.0000 mg | ORAL_TABLET | Freq: Every day | ORAL | Status: DC

## 2013-10-19 NOTE — Telephone Encounter (Signed)
The patient's daughter called and is hoping to get the pt's lipitor med changed.  She is hoping so speak with the CMA as soon as possible.   Callback - [phone] [facility])

## 2013-10-19 NOTE — Telephone Encounter (Signed)
Called informed the daughter of medication sent to pharmacy

## 2013-10-19 NOTE — Progress Notes (Signed)
NEUROLOGY CONSULTATION NOTE  [name] [name] MRN: [id number] DOB: 1941/03/16  Referring provider: Dr. [name] Primary care provider: Dr. [name]  Reason for consult:  Double vision  Dear Dr Jenny Reichmann:  Thank you for your kind referral of [name] [name] for consultation of the above symptoms. Although his history is well known to you, please allow me to reiterate it for the purpose of our medical record. The patient was accompanied to the clinic by his daughter who also provides collateral information.   HISTORY OF PRESENT ILLNESS: This is a very pleasant 73 year old right-handed man with vascular risk factors including hypertension, hyperlipidemia, obesity, and peripheral vascular disease, presenting with a 1 month history of vertical diplopia and skewed vision.  Diplopia occurs when he lies on his left side or if he tilts his head back.  He denies any diplopia on primary gaze.  He had seen his ophthalmologist and tells me his vision is 20/20, and recommended stroke evaluation.  He had a mild headache but none recently.  He denies any dizziness, nausea/vomiting, dysarthria, dysphagia, focal numbness/tingling/weakness, or incoordination.  He denies prior similar symptoms in the past, no loss of vision or eye pain.  He denies any neck/back pain, bowel or bladder dysfunction.   He denies any recent head trauma or infections.  His mother had a cerebral aneurysm.  He has a history of peripheral vascular disease status post stent.  Most recent carotid dopplers 06/2013 showed 50-69% stenosis at the left bulb/proximal ICA; mild plaque in the right ICA.    Laboratory Data: Lab Results  Component Value Date   WBC 8.2 10/14/2013   HGB 16.1 10/14/2013   HCT 48.0 10/14/2013   MCV 92.3 10/14/2013   PLT 211.0 10/14/2013     Chemistry      Component Value Date/Time   NA 136 10/14/2013 0954   K 4.8 10/14/2013 0954   CL 101 10/14/2013 0954   CO2 30 10/14/2013 0954   BUN 16 10/14/2013 0954   CREATININE 1.1  10/14/2013 0954      Component Value Date/Time   CALCIUM 9.4 10/14/2013 0954   ALKPHOS 55 10/14/2013 0954   AST 19 10/14/2013 0954   ALT 18 10/14/2013 0954   BILITOT 0.5 10/14/2013 0954     Lab Results  Component Value Date   CHOL 236* 10/14/2013   HDL 30.40* 10/14/2013   LDLCALC 80 10/18/2012   LDLDIRECT 184.7 10/14/2013   TRIG 209.0* 10/14/2013   CHOLHDL 8 10/14/2013   Lab Results  Component Value Date   HGBA1C 5.6 10/14/2013    PAST MEDICAL HISTORY: Past Medical History  Diagnosis Date  . Abdominal pain, generalized 08/15/2008  . Abdominal pain, unspecified site 10/16/2009  . Anal fissure 04/24/2008  . BACK PAIN 01/10/2009  . BARRETTS ESOPHAGUS 08/16/2007  . BREAST HYPERTROPHY 02/16/2009  . CARBUNCLE, NECK 02/14/2008  . CHEST PAIN 10/10/2008  . DEPRESSION 10/16/2009  . DIVERTICULOSIS, COLON 06/28/2007  . GERD 06/28/2007  . GLUCOSE INTOLERANCE 08/15/2008  . Headache(784.0) 11/13/2009  . HERNIA, UMBILICAL W/OBSTRUCTION W/O GANGRENE 06/28/2007  . HIP PAIN, RIGHT 10/16/2009  . HYPERLIPIDEMIA 06/28/2007  . HYPERTENSION 06/28/2007  . INSOMNIA-SLEEP DISORDER-UNSPEC 10/10/2008  . LEG PAIN, BILATERAL 02/16/2009  . LOW BACK PAIN 06/28/2007  . MASTITIS 01/10/2009  . NECK PAIN 05/31/2008  . NEPHROLITHIASIS, HX OF 06/28/2007  . PROCTOSIGMOIDITIS, ULCERATIVE 1997  . TESTICULAR PAIN, RIGHT 10/25/2010  . VITAMIN D DEFICIENCY 11/13/2009  . Impaired glucose tolerance 04/13/2011  . Barrett's  esophagus 1997  . Arthritis   . Cataract   . PVD (peripheral vascular disease) with claudication, lifestyle limiting, ABI rt. 0.67, lt. 0.73 07/12/2012  . S/P angioplasty with stent, after DB athrectomy to Rt. ext. iliac and Rt. SFA 07/12/2012  . HTN (hypertension) 07/12/2012  . Skin cancer     right hand  . Stenosis of left carotid artery, mod to severe by dopplers 09/29/2012  . Full dentures     PAST SURGICAL HISTORY: Past Surgical History  Procedure Laterality Date  . Left knee arthroscopy    . Bilateral inguinal hernia with mesh  and umbilical hernia repairs  09/2007  . Anal fissure surgury    . Cataract extraction      bilateral  . Colonoscopy    . Atherectomy  09/28/2012  . Hernia repair    . Groin dissection Right 07/20/2013    Procedure: GROIN EXPLORATION, EXCISIONAL BIOPSY RIGHT INGUINAL LYMPH NODES;  Surgeon: Harl Bowie, MD;  Location: [place];  Service: General;  Laterality: Right;    MEDICATIONS: Current Outpatient Prescriptions on File Prior to Visit  Medication Sig Dispense Refill  . aspirin EC 81 MG EC tablet Take 1 tablet (81 mg total) by mouth daily.  30 tablet    . atorvastatin (LIPITOR) 20 MG tablet Take 1 tablet (20 mg total) by mouth daily.  90 tablet  3  . clopidogrel (PLAVIX) 75 MG tablet Take 1 tablet (75 mg total) by mouth daily.  90 tablet  3  . diazepam (VALIUM) 5 MG tablet Take 1 tab by mouth 45 min before your MRI  1 tablet  0  . lisinopril-hydrochlorothiazide (PRINZIDE,ZESTORETIC) 20-25 MG per tablet Take 1 tablet by mouth daily.  90 tablet  3  . MOVIPREP 100 G SOLR Take 1 kit (200 g total) by mouth as directed.  1 kit  0  . pantoprazole (PROTONIX) 40 MG tablet Take 1 tablet (40 mg total) by mouth daily.  90 tablet  3  . sulfaSALAzine (AZULFIDINE) 500 MG tablet Take 2 tablets (1,000 mg total) by mouth 2 (two) times daily.  360 tablet  1  . tamsulosin (FLOMAX) 0.4 MG CAPS Take 0.4 mg by mouth as needed.       . zolpidem (AMBIEN) 10 MG tablet Take 1 tablet (10 mg total) by mouth at bedtime as needed for sleep.  30 tablet  5   No current facility-administered medications on file prior to visit.    ALLERGIES: Allergies  Allergen Reactions  . Contrast Media [Iodinated Diagnostic Agents]     FAMILY HISTORY: Family History  Problem Relation Age of Onset  . COPD Mother   . Cerebral aneurysm Mother   . COPD Father     SOCIAL HISTORY: History   Social History  . Marital Status: Divorced    Spouse Name: N/A    Number of Children: 3  . Years of Education:  N/A   Occupational History  . retired Administrator    Social History Main Topics  . Smoking status: Former Smoker    Quit date: 09/08/1972  . Smokeless tobacco: Current User    Types: Chew  . Alcohol Use: 3.0 oz/week    5 Cans of beer per week     Comment: 1 beer  . Drug Use: No  . Sexual Activity: Not on file   Other Topics Concern  . Not on file   Social History Narrative   Daily caffeine 1 cup coffee daily  REVIEW OF SYSTEMS: Constitutional: No fevers, chills, or sweats, no generalized fatigue, change in appetite Eyes: No eye pain Ear, nose and throat: No hearing loss, ear pain, nasal congestion, sore throat Cardiovascular: No chest pain, palpitations Respiratory:  No shortness of breath at rest or with exertion, wheezes GastrointestinaI: No nausea, vomiting, diarrhea, abdominal pain, fecal incontinence. Colitis well controlled. Genitourinary:  No dysuria, urinary retention or frequency Musculoskeletal:  No neck pain, back pain Integumentary: No rash, pruritus, skin lesions Neurological: as above Psychiatric: No depression, insomnia, anxiety Endocrine: No palpitations, fatigue, diaphoresis, mood swings, change in appetite, change in weight, increased thirst Hematologic/Lymphatic:  No anemia, purpura, petechiae. Allergic/Immunologic: no itchy/runny eyes, nasal congestion, recent allergic reactions, rashes  PHYSICAL EXAM: Filed Vitals:   10/19/13 0959  BP: 140/70  Pulse: 74  Temp: 98.1 F (36.7 C)   General: No acute distress Head:  Normocephalic/atraumatic Neck: supple, no paraspinal tenderness, full range of motion Back: No paraspinal tenderness Heart: regular rate and rhythm Lungs: Clear to auscultation bilaterally. Vascular: No carotid bruits. Skin/Extremities: No rash, no edema Neurological Exam: Mental status: alert and oriented to person, place, and time, no dysarthria or dysphagia, intact fluency and comprehension. Cranial nerves: CN I: not  tested CN II: pupils equal, round and reactive to light, visual fields intact, fundi unremarkable. CN III, IV, VI:  full range of motion, no skew deviation noted on all directions of gaze, no nystagmus, no ptosis.  With red glass test, patient reports seeing red light on top of white light on primary gaze and all directions of gaze, except when looking up and to the left where he reported them side by side. CN V: facial sensation intact CN VII: upper and lower face symmetric CN VIII: hearing intact CN IX, X: gag intact, uvula midline CN XI: sternocleidomastoid and trapezius muscles intact CN XII: tongue midline Bulk & Tone: normal, no fasciculations. Motor: 5/5 throughout with no pronator drift. Sensation: decreased pin and vibration up to knees bilaterally.  Intact cold and joint position sense.  No extinction to double simultaneous stimulation.  Romberg test negative Deep Tendon Reflexes: +2 throughout except for absent ankle jerks bilaterally, no clonus Plantar responses: downgoing bilaterally Finger to nose testing: no incoordination, no dysdiadochokinesia Gait: narrow-based and steady, able to tandem walk adequately.  IMPRESSION: This is a pleasant 73 year old right-handed man with vascular risk factors including hypertension, hyperlipidemia, peripheral vascular disease, with a 1 month history of binocular vertical diplopia.  His neurological exam is non-focal, no gross eye movement abnormalities/palsy, however note of abnormality on red glass test, suggestive of possible left 4th nerve palsy.  He has been evaluated by ophthalmology.  With his vascular risk factors, ischemia is considered, other possible etiologies include inflammatory or compressive lesions.  He will need an MRI brain and MRA head without contrast to assess for underlying structural abnormality.  He may benefit from prisms in the future.  We discussed control of vascular risk factors, and he will discuss other lipid lowering  agents with his PCP.  He will follow-up after the MRI.    Thank you for allowing me to participate in the care of this patient. Please do not hesitate to call for any questions or concerns.   Ellouise Newer, M.D.

## 2013-10-19 NOTE — Patient Instructions (Signed)
1. We have you scheduled for an MRA Head at [facility] [place]. We have rescheduled your MR Brain at the same time. This new appt is 10/26/2013 to arrive at 5:30 pm. Please call [phone] if this is not a good date or time.  2. Follow up after this scan.

## 2013-10-19 NOTE — Telephone Encounter (Signed)
Message copied by Biagio Borg on Wed Oct 19, 2013 12:42 PM ------      Message from: Sharon Seller B      Created: Wed Oct 19, 2013 10:41 AM       Patient informed of results and medication.  The patient has been on lipitor and refuses to go back on as caused severe pain.  Please offer alternative and send #30 to local pharmacy first so he can try. ------

## 2013-10-19 NOTE — Telephone Encounter (Signed)
Ok for lovastatin instead - done erx  Will add liptor to his drug intolerance list

## 2013-10-20 ENCOUNTER — Encounter: Payer: Self-pay | Admitting: Gastroenterology

## 2013-10-24 ENCOUNTER — Telehealth: Payer: Self-pay | Admitting: *Deleted

## 2013-10-24 ENCOUNTER — Encounter: Payer: Self-pay | Admitting: *Deleted

## 2013-10-24 DIAGNOSIS — I739 Peripheral vascular disease, unspecified: Secondary | ICD-10-CM

## 2013-10-24 NOTE — Telephone Encounter (Signed)
Order placed for repeat lower extremity arterial doppler in 6 months  

## 2013-10-24 NOTE — Telephone Encounter (Signed)
Message copied by Chauncy Lean on Mon Oct 24, 2013 12:57 PM ------      Message from: [name]      Created: Mon Oct 24, 2013  6:39 AM       ABIs mildly decreased bilaterally but velocities similar. No signif change. Repeat in 6 montsh ------

## 2013-10-26 ENCOUNTER — Other Ambulatory Visit: Payer: Medicare Other

## 2013-10-26 ENCOUNTER — Telehealth: Payer: Self-pay | Admitting: Internal Medicine

## 2013-10-26 ENCOUNTER — Ambulatory Visit
Admission: RE | Admit: 2013-10-26 | Discharge: 2013-10-26 | Disposition: A | Discharge status: Home / Self Care | Payer: Medicare Other | Source: Ambulatory Visit | Attending: Neurology | Admitting: Neurology

## 2013-10-26 ENCOUNTER — Ambulatory Visit
Admission: RE | Admit: 2013-10-26 | Discharge: 2013-10-26 | Disposition: A | Discharge status: Home / Self Care | Payer: Medicare Other | Source: Ambulatory Visit | Attending: Internal Medicine | Admitting: Internal Medicine

## 2013-10-26 DIAGNOSIS — R51 Headache: Secondary | ICD-10-CM

## 2013-10-26 DIAGNOSIS — H532 Diplopia: Secondary | ICD-10-CM

## 2013-10-26 NOTE — Telephone Encounter (Signed)
Pt's daughter request a phone call from the assistant concern about Lipitor. Please call pt.

## 2013-10-26 NOTE — Telephone Encounter (Signed)
Informed PCP changed his atorvastatin to Lovastatin on 10/19/13.  Stated she would then call the pharmacy because all her dad has at this time is atorvastatin.

## 2013-10-27 ENCOUNTER — Encounter: Payer: Self-pay | Admitting: Internal Medicine

## 2013-10-31 ENCOUNTER — Telehealth: Payer: Self-pay | Admitting: Neurology

## 2013-10-31 NOTE — Telephone Encounter (Signed)
Spoke to Sledge, his daughter, regarding MRI brain. No acute stroke, however there is a chronic infarct in the left frontal region, and chronic ischemic change in the pons.  He has a follow-up on Wed and will discuss this more then.

## 2013-11-02 ENCOUNTER — Ambulatory Visit (INDEPENDENT_AMBULATORY_CARE_PROVIDER_SITE_OTHER): Payer: Medicare Other | Admitting: Neurology

## 2013-11-02 ENCOUNTER — Encounter: Payer: Self-pay | Admitting: Neurology

## 2013-11-02 VITALS — BP 126/76 | HR 74 | Temp 98.1°F | Ht 68.0 in | Wt 242.6 lb

## 2013-11-02 DIAGNOSIS — H532 Diplopia: Secondary | ICD-10-CM

## 2013-11-02 NOTE — Progress Notes (Signed)
NEUROLOGY FOLLOW UP OFFICE NOTE  [name] [name] [id number]  HISTORY OF PRESENT ILLNESS: I had the pleasure of seeing Alexa Golebiewski in follow-up in the neurology clinic on 11/02/13.  The patient was last seen 2 weeks ago and is again accompanied by his daughter today to discuss MRI/MRA brain findings.    HPI: This is a very pleasant 73 yo RH man with vascular risk factors including hypertension, hyperlipidemia, obesity, and peripheral vascular disease, presenting with a 1 month history of vertical diplopia and skewed vision. Diplopia occurs when he lies on his left side or if he tilts his head back. He denies any diplopia on primary gaze. He had seen his ophthalmologist and tells me his vision is 20/20, and recommended stroke evaluation. He had a mild headache but none recently. He denies any dizziness, nausea/vomiting, dysarthria, dysphagia, focal numbness/tingling/weakness, or incoordination. He denies prior similar symptoms in the past, no loss of vision or eye pain. He denies any neck/back pain, bowel or bladder dysfunction.   Interval Course:  Records and images were personally reviewed with the patient.  MRI brain without contrast showed moderate to severe bilateral subcortical and periventricular white matter signal changes, as well as involving the brainstem (mid pons) and left greater than right cerebellar white matter.  There is a chronic infarct in the left frontal cortex and subcortical white matter, with a small amount of chronic hemorrhage on its periphery.  MRA head shows mild non stenotic irregularity of the cavernous and supraclinoid internal carotid arteries. No flow reducing lesion. Basilar artery widely patent with both vertebrals contributing, mild irregularity distal MCA and PCA branches suggesting intracranial atherosclerotic change, no intracranial berry aneurysm.  He continues to have the vertical diplopia when lying on his left side or when looking afar.  They report muscle  pain on Lipitor, now on a different statin and again having calf pains.    PAST MEDICAL HISTORY: Past Medical History  Diagnosis Date  . Abdominal pain, generalized 08/15/2008  . Abdominal pain, unspecified site 10/16/2009  . Anal fissure 04/24/2008  . BACK PAIN 01/10/2009  . BARRETTS ESOPHAGUS 08/16/2007  . BREAST HYPERTROPHY 02/16/2009  . CARBUNCLE, NECK 02/14/2008  . CHEST PAIN 10/10/2008  . DEPRESSION 10/16/2009  . DIVERTICULOSIS, COLON 06/28/2007  . GERD 06/28/2007  . GLUCOSE INTOLERANCE 08/15/2008  . Headache(784.0) 11/13/2009  . HERNIA, UMBILICAL W/OBSTRUCTION W/O GANGRENE 06/28/2007  . HIP PAIN, RIGHT 10/16/2009  . HYPERLIPIDEMIA 06/28/2007  . HYPERTENSION 06/28/2007  . INSOMNIA-SLEEP DISORDER-UNSPEC 10/10/2008  . LEG PAIN, BILATERAL 02/16/2009  . LOW BACK PAIN 06/28/2007  . MASTITIS 01/10/2009  . NECK PAIN 05/31/2008  . NEPHROLITHIASIS, HX OF 06/28/2007  . PROCTOSIGMOIDITIS, ULCERATIVE 1997  . TESTICULAR PAIN, RIGHT 10/25/2010  . VITAMIN D DEFICIENCY 11/13/2009  . Impaired glucose tolerance 04/13/2011  . Barrett's esophagus 1997  . Arthritis   . Cataract   . PVD (peripheral vascular disease) with claudication, lifestyle limiting, ABI rt. 0.67, lt. 0.73 07/12/2012  . S/P angioplasty with stent, after DB athrectomy to Rt. ext. iliac and Rt. SFA 07/12/2012  . HTN (hypertension) 07/12/2012  . Skin cancer     right hand  . Stenosis of left carotid artery, mod to severe by dopplers 09/29/2012  . Full dentures     MEDICATIONS: Current Outpatient Prescriptions on File Prior to Visit  Medication Sig Dispense Refill  . aspirin EC 81 MG EC tablet Take 1 tablet (81 mg total) by mouth daily.  30 tablet    . clopidogrel (PLAVIX)  75 MG tablet Take 1 tablet (75 mg total) by mouth daily.  90 tablet  3  . diazepam (VALIUM) 5 MG tablet Take 1 tab by mouth 45 min before your MRI  1 tablet  0  . lisinopril-hydrochlorothiazide (PRINZIDE,ZESTORETIC) 20-25 MG per tablet Take 1 tablet by mouth daily.  90 tablet  3    . lovastatin (MEVACOR) 40 MG tablet Take 1 tablet (40 mg total) by mouth at bedtime.  30 tablet  11  . MOVIPREP 100 G SOLR Take 1 kit (200 g total) by mouth as directed.  1 kit  0  . pantoprazole (PROTONIX) 40 MG tablet Take 1 tablet (40 mg total) by mouth daily.  90 tablet  3  . sulfaSALAzine (AZULFIDINE) 500 MG tablet Take 2 tablets (1,000 mg total) by mouth 2 (two) times daily.  360 tablet  1  . tamsulosin (FLOMAX) 0.4 MG CAPS Take 0.4 mg by mouth as needed.       . zolpidem (AMBIEN) 10 MG tablet Take 1 tablet (10 mg total) by mouth at bedtime as needed for sleep.  30 tablet  5   No current facility-administered medications on file prior to visit.    ALLERGIES: Allergies  Allergen Reactions  . Contrast Media [Iodinated Diagnostic Agents]   . Lipitor [Atorvastatin] Other (See Comments)    myalgias    FAMILY HISTORY: Family History  Problem Relation Age of Onset  . COPD Mother   . Cerebral aneurysm Mother   . COPD Father     SOCIAL HISTORY: History   Social History  . Marital Status: Divorced    Spouse Name: N/A    Number of Children: 3  . Years of Education: N/A   Occupational History  . retired Administrator    Social History Main Topics  . Smoking status: Former Smoker    Quit date: 09/08/1972  . Smokeless tobacco: Current User    Types: Chew  . Alcohol Use: 3.0 oz/week    5 Cans of beer per week     Comment: 1 beer  . Drug Use: No  . Sexual Activity: Not on file   Other Topics Concern  . Not on file   Social History Narrative   Daily caffeine 1 cup coffee daily    REVIEW OF SYSTEMS: Constitutional: No fevers, chills, or sweats, no generalized fatigue, change in appetite Eyes: No eye pain Ear, nose and throat: No hearing loss, ear pain, nasal congestion, sore throat Cardiovascular: No chest pain, palpitations Respiratory:  No shortness of breath at rest or with exertion, wheezes GastrointestinaI: No nausea, vomiting, diarrhea, abdominal pain, fecal  incontinence Genitourinary:  No dysuria, urinary retention or frequency Musculoskeletal:  No neck pain, back pain Integumentary: No rash, pruritus, skin lesions Neurological: as above Psychiatric: No depression, insomnia, anxiety Endocrine: No palpitations, fatigue, diaphoresis, mood swings, change in appetite, change in weight, increased thirst Hematologic/Lymphatic:  No anemia, purpura, petechiae. Allergic/Immunologic: no itchy/runny eyes, nasal congestion, recent allergic reactions, rashes  PHYSICAL EXAM: Filed Vitals:   11/02/13 0947  BP: 126/76  Pulse: 74  Temp: 98.1 F (36.7 C)   General: No acute distress Head:  Normocephalic/atraumatic Neck: supple, no paraspinal tenderness, full range of motion Heart:  Regular rate and rhythm Lungs:  Clear to auscultation bilaterally Back: No paraspinal tenderness Skin/Extremities: No rash, no edema Neurological Exam: alert and oriented to person, place, and time. No aphasia or dysarthria. Fund of knowledge is appropriate.  Recent and remote memory are intact.  Attention  and concentration are normal.    Able to name objects and repeat phrases. Cranial nerves: Pupils equal, round, reactive to light.  Extraocular movements intact with no nystagmus. No diplopia today.  Visual fields full. Facial sensation intact. No facial asymmetry. Tongue, uvula, palate midline.  Motor: Bulk and tone normal, muscle strength 5/5 throughout with no pronator drift.  Sensation to light touch, temperature.  No extinction to double simultaneous stimulation.  Deep tendon reflexes 2+ throughout except for absent ankle reflexes, toes downgoing.  Finger to nose testing intact.  Gait narrow-based and steady, able to tandem walk adequately.  Romberg negative.  IMPRESSION: This is a pleasant 73 year old right-handed man with vascular risk factors including hypertension, hyperlipidemia, peripheral vascular disease, with a 1 month history of binocular vertical diplopia. His  neurological exam is non-focal, no gross eye movement abnormalities/palsy, however note of abnormality on red glass test, suggestive of possible left 4th nerve palsy.  MRI brain did not show any acute changes, however there is note of microvascular disease in the brainstem.  He also has an old left frontal stroke with no residual deficits.  We discussed small vessel disease and control of vascular risk factors, he will discuss other lipid lowering agents with his PCP. He will be referred to neuro-ophthalmology to assess if he is a candidate for prisms.   We discussed stroke symptoms and he knows indications to go to ER asap.  He will follow-up in 4 months.  Thank you for allowing me to participate in his care.  Please do not hesitate to call for any questions or concerns.  The duration of this appointment visit was 20 minutes of face-to-face time with the patient.  Greater than 50% of this time was spent in counseling, explanation of diagnosis, planning of further management, and coordination of care.   Ellouise Newer, M.D.

## 2013-11-02 NOTE — Patient Instructions (Signed)
1. Refer to [facility] for neuro-ophthalmology evaluation 2. Continue with all current medications 3. Speak to Dr. Jenny Reichmann regarding other options for lipid control 4. Go to ER for any change in symptoms, such as slurred speech, weakness on one side, severe headache 5. Follow-up in 4-73month

## 2013-11-05 ENCOUNTER — Other Ambulatory Visit: Payer: Self-pay | Admitting: Gastroenterology

## 2013-11-07 ENCOUNTER — Telehealth: Payer: Self-pay | Admitting: Physician Assistant

## 2013-11-07 DIAGNOSIS — K5190 Ulcerative colitis, unspecified, without complications: Secondary | ICD-10-CM

## 2013-11-07 DIAGNOSIS — K2270 Barrett's esophagus without dysplasia: Secondary | ICD-10-CM

## 2013-11-07 MED ORDER — MOVIPREP 100 G PO SOLR: 1.0000 | ORAL | Status: DC

## 2013-11-08 ENCOUNTER — Telehealth: Payer: Self-pay | Admitting: *Deleted

## 2013-11-08 NOTE — Telephone Encounter (Signed)
[name] [name] was unavailable but his daughter Arrie Aran answered and said her Dad told her if we call, she is to get the Plavix directions.  I advised he can stop the Plavix on 3-22 and resume the Plavix on 3-28.  She told me she would let him know that today.

## 2013-11-08 NOTE — Telephone Encounter (Signed)
[name] [name],  [name] [name] can stop his Plavix 5-7 days prior to your procedure safely. This was started because of stents in his peripheral vasculature. Please have him restart it after the procedure as soon as you feel it's clinically safe period  [name], M.D., [employer], [facility], [place], [place] [place], [place]  [address]  [phone] 11/08/2013 8:05 AM

## 2013-11-09 ENCOUNTER — Ambulatory Visit: Payer: Medicare Other | Admitting: Internal Medicine

## 2013-11-10 ENCOUNTER — Telehealth: Payer: Self-pay | Admitting: Neurology

## 2013-11-10 NOTE — Telephone Encounter (Signed)
Pt's daughter called wanting to follow up on the referral that was suppose to be sent out And wanted to confirm if the 11/17/13 appointment is correct for his f/u...she thought that his f/u was in 6 months.

## 2013-11-10 NOTE — Telephone Encounter (Signed)
Spoke with [name] patients daughter informed her of his appointment with [facility] on March 13 @ 11:45.

## 2013-11-16 ENCOUNTER — Ambulatory Visit: Payer: Medicare Other | Admitting: Neurology

## 2013-11-16 ENCOUNTER — Telehealth: Payer: Self-pay | Admitting: Neurology

## 2013-11-16 NOTE — Telephone Encounter (Signed)
Pt's daughter called stating that they received a call from Dr. Amparo Bristol nurse. She would like to know what the call was regarding.

## 2013-11-16 NOTE — Telephone Encounter (Signed)
Spoke with patients daughter about his next appointment being in six months not today

## 2013-11-22 ENCOUNTER — Encounter: Payer: Medicare Other | Admitting: Gastroenterology

## 2013-11-25 ENCOUNTER — Other Ambulatory Visit: Payer: Self-pay | Admitting: Ophthalmology

## 2013-11-25 ENCOUNTER — Ambulatory Visit
Admission: RE | Admit: 2013-11-25 | Discharge: 2013-11-25 | Disposition: A | Discharge status: Home / Self Care | Payer: Medicare Other | Source: Ambulatory Visit | Attending: Ophthalmology | Admitting: Ophthalmology

## 2013-11-25 DIAGNOSIS — Z01818 Encounter for other preprocedural examination: Secondary | ICD-10-CM

## 2013-12-02 ENCOUNTER — Ambulatory Visit (AMBULATORY_SURGERY_CENTER): Payer: Medicare Other | Admitting: Gastroenterology

## 2013-12-02 ENCOUNTER — Encounter: Payer: Self-pay | Admitting: Gastroenterology

## 2013-12-02 ENCOUNTER — Other Ambulatory Visit: Payer: Self-pay | Admitting: Gastroenterology

## 2013-12-02 VITALS — BP 133/70 | HR 73 | Temp 97.0°F | Resp 10 | Ht 68.0 in | Wt 241.0 lb

## 2013-12-02 DIAGNOSIS — D126 Benign neoplasm of colon, unspecified: Secondary | ICD-10-CM

## 2013-12-02 DIAGNOSIS — K2940 Chronic atrophic gastritis without bleeding: Secondary | ICD-10-CM

## 2013-12-02 DIAGNOSIS — K5130 Ulcerative (chronic) rectosigmoiditis without complications: Secondary | ICD-10-CM

## 2013-12-02 DIAGNOSIS — K2270 Barrett's esophagus without dysplasia: Secondary | ICD-10-CM

## 2013-12-02 DIAGNOSIS — K5190 Ulcerative colitis, unspecified, without complications: Secondary | ICD-10-CM

## 2013-12-02 DIAGNOSIS — Z86010 Personal history of colonic polyps: Secondary | ICD-10-CM

## 2013-12-02 DIAGNOSIS — Z1211 Encounter for screening for malignant neoplasm of colon: Secondary | ICD-10-CM

## 2013-12-02 DIAGNOSIS — K5289 Other specified noninfective gastroenteritis and colitis: Secondary | ICD-10-CM

## 2013-12-02 MED ORDER — MESALAMINE 1000 MG RE SUPP: 1000.0000 mg | Freq: Every day | RECTAL | Status: DC

## 2013-12-02 MED ORDER — SODIUM CHLORIDE 0.9 % IV SOLN: 500.0000 mL | INTRAVENOUS | Status: DC

## 2013-12-02 NOTE — Progress Notes (Signed)
Called to room to assist during endoscopic procedure.  Patient ID and intended procedure confirmed with present staff. Received instructions for my participation in the procedure from the performing physician.  

## 2013-12-02 NOTE — Progress Notes (Signed)
Samples of Melaslamine #30 supp. Was given to pt.  He states he understand how to use it. No complaints noted in the recovery room. Maw

## 2013-12-02 NOTE — Patient Instructions (Signed)
YOU HAD AN ENDOSCOPIC PROCEDURE TODAY AT [facility]: Refer to the procedure report that was given to you for any specific questions about what was found during the examination.  If the procedure report does not answer your questions, please call your gastroenterologist to clarify.  If you requested that your care partner not be given the details of your procedure findings, then the procedure report has been included in a sealed envelope for you to review at your convenience later.  YOU SHOULD EXPECT: Some feelings of bloating in the abdomen. Passage of more gas than usual.  Walking can help get rid of the air that was put into your GI tract during the procedure and reduce the bloating. If you had a lower endoscopy (such as a colonoscopy or flexible sigmoidoscopy) you may notice spotting of blood in your stool or on the toilet paper. If you underwent a bowel prep for your procedure, then you may not have a normal bowel movement for a few days.  DIET: Your first meal following the procedure should be a light meal and then it is ok to progress to your normal diet.  A half-sandwich or bowl of soup is an example of a good first meal.  Heavy or fried foods are harder to digest and may make you feel nauseous or bloated.  Likewise meals heavy in dairy and vegetables can cause extra gas to form and this can also increase the bloating.  Drink plenty of fluids but you should avoid alcoholic beverages for 24 hours.  ACTIVITY: Your care partner should take you home directly after the procedure.  You should plan to take it easy, moving slowly for the rest of the day.  You can resume normal activity the day after the procedure however you should NOT DRIVE or use heavy machinery for 24 hours (because of the sedation medicines used during the test).    SYMPTOMS TO REPORT IMMEDIATELY: A gastroenterologist can be reached at any hour.  During normal business hours, 8:30 AM to 5:00 PM Monday through Friday,  call [phone].  After hours and on weekends, please call the GI answering service at [phone] who will take a message and have the physician on call contact you.   Following lower endoscopy (colonoscopy or flexible sigmoidoscopy):  Excessive amounts of blood in the stool  Significant tenderness or worsening of abdominal pains  Swelling of the abdomen that is new, acute  Fever of 100F or higher  Following upper endoscopy (EGD)  Vomiting of blood or coffee ground material  New chest pain or pain under the shoulder blades  Painful or persistently difficult swallowing  New shortness of breath  Fever of 100F or higher  Black, tarry-looking stools  FOLLOW UP: If any biopsies were taken you will be contacted by phone or by letter within the next 1-3 weeks.  Call your gastroenterologist if you have not heard about the biopsies in 3 weeks.  Our staff will call the home number listed on your records the next business day following your procedure to check on you and address any questions or concerns that you may have at that time regarding the information given to you following your procedure. This is a courtesy call and so if there is no answer at the home number and we have not heard from you through the emergency physician on call, we will assume that you have returned to your regular daily activities without incident.  SIGNATURES/CONFIDENTIALITY: You and/or your care  partner have signed paperwork which will be entered into your electronic medical record.  These signatures attest to the fact that that the information above on your After Visit Summary has been reviewed and is understood.  Full responsibility of the confidentiality of this discharge information lies with you and/or your care-partner.   Handouts were given to your care partner on proctitis, diverticulosis, a high fiber diet with liberal fluid intake, polyps, barrett's esophagus, GERD AND GASTRITIS. Samples of melsalamine  supp. Use 1 1,016m supp at bedtime per rectum.  You may resume your plavix tomorrow.  You may resume your other current medications today. Please call if any questions or concerns.

## 2013-12-02 NOTE — Op Note (Addendum)
[place]  [address] [place], [address]   COLONOSCOPY PROCEDURE REPORT PATIENT: [name] [name], [name] [name]  MR#: [id number] BIRTHDATE: May 19, 1941 , 72  yrs. old GENDER: Male ENDOSCOPIST: Ladene Artist, MD, [facility] PROCEDURE DATE:  12/02/2013 PROCEDURE:   Colonoscopy with biopsy and snare polypectomy First Screening Colonoscopy - Avg.  risk and is 50 yrs.  old or older - No.  Prior Negative Screening - Now for repeat screening. N/A  History of Adenoma - Now for follow-up colonoscopy & has been > or = to 3 yrs.  N/A  Polyps Removed Today? Yes. ASA CLASS:   Class III INDICATIONS: Personal history of adenomatous colon polyps, high risk patient with previously diagnosed UC left-sided colitis-elevated risk screening. MEDICATIONS: MAC sedation, administered by CRNA and propofol (Diprivan) 367m IV DESCRIPTION OF PROCEDURE:   After the risks benefits and alternatives of the procedure were thoroughly explained, informed consent was obtained.  A digital rectal exam revealed no abnormalities of the rectum.   The LB [id number] endoscope was introduced through the anus and advanced to the cecum, which was identified by both the appendix and ileocecal valve. No adverse events experienced.   The quality of the prep was good, using MoviPrep  The instrument was then slowly withdrawn as the colon was fully examined.  COLON FINDINGS: Abnormal mucosa was found in the sigmoid colon and rectum.  The mucosa was erythematous, friable and had granularity. Multiple biopsies of the area were performed. Moderate diverticulosis was noted in the sigmoid colon. A sessile polyp measuring 5 mm in size was found in the sigmoid colon.  A polypectomy was performed with a cold snare.  The resection was complete and the polyp tissue was completely retrieved.  The colon was otherwise normal. Random biopsies taken throughout. There was no diverticulosis, inflammation, polyps or cancers  unless previously stated.  Retroflexed views revealed active proctitis. The time to cecum=1 minutes 32 seconds.  Withdrawal time=12 minutes 04 seconds.  The scope was withdrawn and the procedure completed.  COMPLICATIONS: There were no complications.  ENDOSCOPIC IMPRESSION: 1.   Abnormal mucosa in the sigmoid colon/rectum and active proctitis in the distal rectum; multiple biopsies performed 2.   Moderate diverticulosis in the sigmoid colon 3.   Sessile polyp measuring 5 mm in the sigmoid colon; polypectomy performed with a cold snare  RECOMMENDATIONS: 1.  Await pathology results 2.  Repeat Colonoscopy in 2 years. 3.  Canasa 1000 mg supp hs, #30, 1 refill  eSigned:  MLadene Artist MD, FV[facility]03/27/2015 2:55 PM Revised: 12/02/2013 2:55 PM

## 2013-12-02 NOTE — Progress Notes (Signed)
Pt stable to RR 

## 2013-12-02 NOTE — Op Note (Signed)
[place]  [address]. [place], [address]   ENDOSCOPY PROCEDURE REPORT  PATIENT: [name], [name]  MR#: [id number] BIRTHDATE: January 05, 1941 , 54  yrs. old GENDER: Male ENDOSCOPIST: Ladene Artist, MD, [facility] PROCEDURE DATE:  12/02/2013 PROCEDURE:  EGD w/ biopsy ASA CLASS:     Class III INDICATIONS:  history of Barrett's esophagus. MEDICATIONS: residual sedation effect present prior procedure, MAC sedation, administered by CRNA, propofol (Diprivan) 150m IV TOPICAL ANESTHETIC: none DESCRIPTION OF PROCEDURE: After the risks benefits and alternatives of the procedure were thoroughly explained, informed consent was obtained.  The LB [id number] was introduced through the mouth and advanced to the second portion of the duodenum. Without limitations.  The instrument was slowly withdrawn as the mucosa was fully examined.  ESOPHAGUS: There was evidence of Barrett's esophagus at the gastroesophageal junction. 1 cm in length. Multiple biopsies were performed.   The esophagus was otherwise normal. STOMACH: Mild gastritis  was found in the gastric body and gastric fundus.  Multiple biopsies were performed.  The stomach otherwise appeared normal. DUODENUM: The duodenal mucosa showed no abnormalities in the bulb and second portion of the duodenum.  Retroflexed views revealed no abnormalities.   The scope was then withdrawn from the patient and the procedure completed.  COMPLICATIONS: There were no complications.  ENDOSCOPIC IMPRESSION: 1.    Barrett's esophagus; multiple biopsies 2.    Gastritis in the body and fundus; multiple biopsies  RECOMMENDATIONS: 1.  Anti-reflux regimen 2.  Await pathology results 3.  EGD in 3-5  years if no dysplasia, timing to correspond with colonoscopy 4.  Continue PPI 5.  Resume Plavix tomorrow  eSigned:  MLadene Artist MD, FHaxt[facility]03/27/2015 2:51 PM

## 2013-12-05 ENCOUNTER — Telehealth: Payer: Self-pay | Admitting: *Deleted

## 2013-12-05 NOTE — Telephone Encounter (Signed)
  Follow up Call-  Call back number 12/02/2013 03/05/2012  Post procedure Call Back phone  # [phone]  Permission to leave phone message Yes Yes     Patient questions:  Do you have a fever, pain , or abdominal swelling? no Pain Score  0 *  Have you tolerated food without any problems? yes  Have you been able to return to your normal activities? yes  Do you have any questions about your discharge instructions: Diet   no Medications  no Follow up visit  no  Do you have questions or concerns about your Care? no  Actions: * If pain score is 4 or above: No action needed, pain <4.

## 2013-12-08 ENCOUNTER — Telehealth (HOSPITAL_COMMUNITY): Payer: Self-pay | Admitting: *Deleted

## 2013-12-09 ENCOUNTER — Telehealth (HOSPITAL_COMMUNITY): Payer: Self-pay | Admitting: *Deleted

## 2013-12-11 ENCOUNTER — Encounter: Payer: Self-pay | Admitting: Gastroenterology

## 2013-12-12 ENCOUNTER — Ambulatory Visit: Payer: Medicare Other | Admitting: Cardiology

## 2013-12-21 ENCOUNTER — Ambulatory Visit (INDEPENDENT_AMBULATORY_CARE_PROVIDER_SITE_OTHER): Payer: Medicare Other | Admitting: Cardiology

## 2013-12-21 ENCOUNTER — Encounter: Payer: Self-pay | Admitting: Cardiology

## 2013-12-21 ENCOUNTER — Ambulatory Visit: Payer: Medicare Other | Admitting: Cardiology

## 2013-12-21 VITALS — BP 140/60 | HR 84 | Ht 68.0 in | Wt 238.4 lb

## 2013-12-21 DIAGNOSIS — Z95820 Peripheral vascular angioplasty status with implants and grafts: Secondary | ICD-10-CM

## 2013-12-21 DIAGNOSIS — I10 Essential (primary) hypertension: Secondary | ICD-10-CM

## 2013-12-21 DIAGNOSIS — I739 Peripheral vascular disease, unspecified: Secondary | ICD-10-CM

## 2013-12-21 DIAGNOSIS — Z95828 Presence of other vascular implants and grafts: Secondary | ICD-10-CM

## 2013-12-21 DIAGNOSIS — Z9889 Other specified postprocedural states: Secondary | ICD-10-CM

## 2013-12-21 DIAGNOSIS — E785 Hyperlipidemia, unspecified: Secondary | ICD-10-CM

## 2013-12-21 NOTE — Assessment & Plan Note (Signed)
No claudication, dopplers OK Feb 2015

## 2013-12-21 NOTE — Patient Instructions (Signed)
Your physician recommends that you schedule a follow-up appointment in:6 months with Dr Gwenlyn Found

## 2013-12-21 NOTE — Progress Notes (Signed)
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 He had a negative Myoview. Carotid Dopplers that did show moderately severe left ICA stenosis. Dr [name] [name] angiogram'd him on July 12, 2012, and put a stent in his right external iliac as well as 2 overlapping self-expanding stents in his mid right SFA. He had 3-vessel runoff. Unfortunately, he had some thrombus and needed to undergo aspiration thrombectomy. He had left calf claudication with angiographically documented 80% mid left SFA stenosis and on September 28, 2012, he underwent [place] orbital rotational atherectomy, PTA and stenting of his mid left SFA with an excellent result. His most recent Dopplers revealed ABIs in the mid to high 0.75 range bilaterally with patent stents. He denied leg pain to me. Since we saw him last he had eye surgery, his Plavix was held for a few days for this.    Current Outpatient Prescriptions  Medication Sig Dispense Refill  . aspirin EC 81 MG EC tablet Take 1 tablet (81 mg total) by mouth daily.  30 tablet    . clopidogrel (PLAVIX) 75 MG tablet Take 1 tablet (75 mg total) by mouth daily.  90 tablet  3  . lisinopril-hydrochlorothiazide (PRINZIDE,ZESTORETIC) 20-25 MG per tablet Take 1 tablet by mouth daily.  90 tablet  3  . mesalamine (CANASA) 1000 MG suppository Place 1 suppository (1,000 mg total) rectally at bedtime.  30 suppository  0  . pantoprazole (PROTONIX) 40 MG tablet Take 1 tablet (40 mg total) by mouth daily.  90 tablet  3  . SULFAZINE 500 MG tablet Take 2 tablets by mouth  twice a day  120 tablet  5  . TOBRADEX ophthalmic ointment       . zolpidem (AMBIEN) 10 MG tablet Take 1 tablet (10 mg total) by mouth at bedtime as needed for sleep.  30 tablet  5  . lovastatin  (MEVACOR) 40 MG tablet Take 1 tablet (40 mg total) by mouth at bedtime.  30 tablet  11   No current facility-administered medications for this visit.    Allergies  Allergen Reactions  . Contrast Media [Iodinated Diagnostic Agents]   . Lipitor [Atorvastatin] Other (See Comments)    myalgias    History   Social History  . Marital Status: Divorced    Spouse Name: N/A    Number of Children: 3  . Years of Education: N/A   Occupational History  . retired Administrator    Social History Main Topics  . Smoking status: Former Smoker    Quit date: 09/08/1972  . Smokeless tobacco: Current User    Types: Chew  . Alcohol Use: 3.0 oz/week    5 Cans of beer per week     Comment: 1 beer  . Drug Use: No  . Sexual Activity: Not on file   Other Topics Concern  . Not on file   Social History Narrative   Daily caffeine 1 cup coffee daily     Review of Systems: General: negative for chills, fever, night sweats or weight changes.  Cardiovascular: negative for chest pain, dyspnea on exertion, edema, orthopnea, palpitations, paroxysmal nocturnal dyspnea or shortness of breath Dermatological: negative for rash Respiratory: negative for cough or wheezing Urologic: negative for hematuria Abdominal: negative for nausea, vomiting, diarrhea, bright  red blood per rectum, melena, or hematemesis Neurologic: negative for visual changes, syncope, or dizziness All other systems reviewed and are otherwise negative except as noted above.    Blood pressure 140/60, pulse 84, height 5' 8"  (1.727 m), weight 238 lb 6.4 oz (108.138 kg).  General appearance: alert, cooperative, no distress and moderately obese Lungs: clear to auscultation bilaterally Heart: regular rate and rhythm Extremities: extremities normal, atraumatic, no cyanosis or edema, diminished pulses bilat  EKG NSR  ASSESSMENT AND PLAN:   PVD (peripheral vascular disease) with claudication, lifestyle limiting, ABI rt. 0.67, lt. 0.73 No  claudication, dopplers OK Feb 2015  HYPERTENSION Stable  HYPERLIPIDEMIA Statin intol  S/P insertion of iliac artery stent, external iliac and RT SFA  with DB athrectomy 07/12/12 .  S/P angioplasty with stent, and [place]boHawk athrectomy mid lt. SFA for life style limiting claudication 09/28/12 .    PLAN  Same Rx, f/u Dr [name] [name] 6 months [name] [name] [facility] 12/21/2013 11:04 AM

## 2013-12-21 NOTE — Assessment & Plan Note (Signed)
Stable

## 2013-12-21 NOTE — Assessment & Plan Note (Signed)
Statin intol

## 2014-01-10 ENCOUNTER — Ambulatory Visit (INDEPENDENT_AMBULATORY_CARE_PROVIDER_SITE_OTHER): Payer: Medicare Other | Admitting: Internal Medicine

## 2014-01-10 ENCOUNTER — Encounter: Payer: Self-pay | Admitting: Internal Medicine

## 2014-01-10 VITALS — BP 120/80 | HR 79 | Temp 98.4°F | Ht 68.0 in | Wt 236.1 lb

## 2014-01-10 DIAGNOSIS — E785 Hyperlipidemia, unspecified: Secondary | ICD-10-CM

## 2014-01-10 DIAGNOSIS — R7302 Impaired glucose tolerance (oral): Secondary | ICD-10-CM

## 2014-01-10 DIAGNOSIS — R7309 Other abnormal glucose: Secondary | ICD-10-CM

## 2014-01-10 DIAGNOSIS — I10 Essential (primary) hypertension: Secondary | ICD-10-CM

## 2014-01-10 MED ORDER — OMEGA-3-ACID ETHYL ESTERS 1 G PO CAPS: 1.0000 g | ORAL_CAPSULE | Freq: Every day | ORAL | Status: DC

## 2014-01-10 NOTE — Progress Notes (Signed)
Subjective:    Patient ID: [name] [name], male    DOB: 07/25/1941, 73 y.o.   MRN: [id number]  HPI  Here to f/u; overall doing ok,  Pt denies chest pain, increased sob or doe, wheezing, orthopnea, PND, increased LE swelling, palpitations, dizziness or syncope.  Pt denies polydipsia, polyuria, or low sugar symptoms such as weakness or confusion improved with po intake.  Pt denies new neurological symptoms such as new headache, or facial or extremity weakness or numbness.   Pt states overall good compliance with meds, has been trying to follow lower cholesterol diet, with wt overall stable,  but little exercise however. Had to stop the lovastatin similar to 3 prior statin due to leg pain.    Past Medical History  Diagnosis Date  . Abdominal pain, generalized 08/15/2008  . Abdominal pain, unspecified site 10/16/2009  . Anal fissure 04/24/2008  . BACK PAIN 01/10/2009  . BARRETTS ESOPHAGUS 08/16/2007  . BREAST HYPERTROPHY 02/16/2009  . CARBUNCLE, NECK 02/14/2008  . CHEST PAIN 10/10/2008  . DEPRESSION 10/16/2009  . DIVERTICULOSIS, COLON 06/28/2007  . GERD 06/28/2007  . GLUCOSE INTOLERANCE 08/15/2008  . Headache(784.0) 11/13/2009  . HERNIA, UMBILICAL W/OBSTRUCTION W/O GANGRENE 06/28/2007  . HIP PAIN, RIGHT 10/16/2009  . HYPERLIPIDEMIA 06/28/2007  . HYPERTENSION 06/28/2007  . INSOMNIA-SLEEP DISORDER-UNSPEC 10/10/2008  . LEG PAIN, BILATERAL 02/16/2009  . LOW BACK PAIN 06/28/2007  . MASTITIS 01/10/2009  . NECK PAIN 05/31/2008  . NEPHROLITHIASIS, HX OF 06/28/2007  . PROCTOSIGMOIDITIS, ULCERATIVE 1997  . TESTICULAR PAIN, RIGHT 10/25/2010  . VITAMIN D DEFICIENCY 11/13/2009  . Impaired glucose tolerance 04/13/2011  . Barrett's esophagus 1997  . Arthritis   . Cataract   . PVD (peripheral vascular disease) with claudication, lifestyle limiting, ABI rt. 0.67, lt. 0.73 07/12/2012  . S/P angioplasty with stent, after DB athrectomy to Rt. ext. iliac and Rt. SFA 07/12/2012  . HTN (hypertension) 07/12/2012  . Skin cancer    right hand  . Stenosis of left carotid artery, mod to severe by dopplers 09/29/2012  . Full dentures    Past Surgical History  Procedure Laterality Date  . Left knee arthroscopy    . Bilateral inguinal hernia with mesh and umbilical hernia repairs  09/2007  . Anal fissure surgury    . Cataract extraction      bilateral  . Colonoscopy    . Atherectomy  09/28/2012  . Hernia repair    . Groin dissection Right 07/20/2013    Procedure: GROIN EXPLORATION, EXCISIONAL BIOPSY RIGHT INGUINAL LYMPH NODES;  Surgeon: Harl Bowie, MD;  Location: [facility];  Service: General;  Laterality: Right;    reports that he quit smoking about 41 years ago. His smokeless tobacco use includes Chew. He reports that he drinks about 3 ounces of alcohol per week. He reports that he does not use illicit drugs. family history includes COPD in his father and mother; Cerebral aneurysm in his mother. There is no history of Colon cancer, Esophageal cancer, Rectal cancer, or Stomach cancer. Allergies  Allergen Reactions  . Contrast Media [Iodinated Diagnostic Agents]   . Lipitor [Atorvastatin] Other (See Comments)    myalgias  . Lovastatin     Muscle pain  . Statins     Legs pain   Current Outpatient Prescriptions on File Prior to Visit  Medication Sig Dispense Refill  . aspirin EC 81 MG EC tablet Take 1 tablet (81 mg total) by mouth daily.  30 tablet    . clopidogrel (  PLAVIX) 75 MG tablet Take 1 tablet (75 mg total) by mouth daily.  90 tablet  3  . lisinopril-hydrochlorothiazide (PRINZIDE,ZESTORETIC) 20-25 MG per tablet Take 1 tablet by mouth daily.  90 tablet  3  . lovastatin (MEVACOR) 40 MG tablet Take 1 tablet (40 mg total) by mouth at bedtime.  30 tablet  11  . mesalamine (CANASA) 1000 MG suppository Place 1 suppository (1,000 mg total) rectally at bedtime.  30 suppository  0  . pantoprazole (PROTONIX) 40 MG tablet Take 1 tablet (40 mg total) by mouth daily.  90 tablet  3  . SULFAZINE 500 MG  tablet Take 2 tablets by mouth  twice a day  120 tablet  5  . TOBRADEX ophthalmic ointment       . zolpidem (AMBIEN) 10 MG tablet Take 1 tablet (10 mg total) by mouth at bedtime as needed for sleep.  30 tablet  5   No current facility-administered medications on file prior to visit.    Review of Systems  Constitutional: Negative for unusual diaphoresis or other sweats  HENT: Negative for ringing in ear Eyes: Negative for double vision or worsening visual disturbance.  Respiratory: Negative for choking and stridor.   Gastrointestinal: Negative for vomiting or other signifcant bowel change Genitourinary: Negative for hematuria or decreased urine volume.  Musculoskeletal: Negative for other MSK pain or swelling Skin: Negative for color change and worsening wound.  Neurological: Negative for tremors and numbness other than noted  Psychiatric/Behavioral: Negative for decreased concentration or agitation other than above       Objective:   Physical Exam BP 120/80  Pulse 79  Temp(Src) 98.4 F (36.9 C) (Oral)  Ht 5' 8"  (1.727 m)  Wt 236 lb 2 oz (107.106 kg)  BMI 35.91 kg/m2  SpO2 98% VS noted,  Constitutional: Pt appears well-developed, well-nourished.  HENT: Head: NCAT.  Right Ear: External ear normal.  Left Ear: External ear normal.  Eyes: . Pupils are equal, round, and reactive to light. Conjunctivae and EOM are normal Neck: Normal range of motion. Neck supple.  Cardiovascular: Normal rate and regular rhythm.   Pulmonary/Chest: Effort normal and breath sounds normal.  Neurological: Pt is alert. Not confused , motor grossly intact Skin: Skin is warm. No rash No edema Psychiatric: Pt behavior is normal. No agitation.     Assessment & Plan:

## 2014-01-10 NOTE — Assessment & Plan Note (Signed)
stable overall by history and exam, recent data reviewed with pt, and pt to continue medical treatment as before,  to f/u any worsening symptoms or concerns Lab Results  Component Value Date   HGBA1C 5.6 10/14/2013

## 2014-01-10 NOTE — Progress Notes (Signed)
Pre visit review using our clinic review tool, if applicable. No additional management support is needed unless otherwise documented below in the visit note. 

## 2014-01-10 NOTE — Assessment & Plan Note (Signed)
Now considered statin intolerant, for lovaza asd if can afford, low chol diet,  to f/u any worsening symptoms or concerns  Lab Results  Component Value Date   LDLCALC 80 10/18/2012

## 2014-01-10 NOTE — Patient Instructions (Signed)
Please take all new medication as prescribed  - the lovaza if not too expensive  Please continue all other medications as before, and refills have been done if requested. Please have the pharmacy call with any other refills you may need.  Please continue your efforts at being more active, low cholesterol diet, and weight control.  Please return in 6 months, or sooner if needed

## 2014-01-10 NOTE — Assessment & Plan Note (Signed)
stable overall by history and exam, recent data reviewed with pt, and pt to continue medical treatment as before,  to f/u any worsening symptoms or concerns BP Readings from Last 3 Encounters:  01/10/14 120/80  12/21/13 140/60  12/02/13 133/70

## 2014-01-11 ENCOUNTER — Telehealth: Payer: Self-pay | Admitting: Internal Medicine

## 2014-01-11 NOTE — Telephone Encounter (Signed)
Relevant patient education mailed to patient.  

## 2014-01-22 ENCOUNTER — Other Ambulatory Visit: Payer: Self-pay | Admitting: Internal Medicine

## 2014-02-09 ENCOUNTER — Telehealth: Payer: Self-pay

## 2014-02-09 MED ORDER — TRAZODONE HCL 50 MG PO TABS: 25.0000 mg | ORAL_TABLET | Freq: Every evening | ORAL | Status: DC | PRN

## 2014-02-09 NOTE — Telephone Encounter (Signed)
PA required on Zolpidem or change to Trazodone

## 2014-02-09 NOTE — Telephone Encounter (Signed)
OK To try change to trazodone qhs prn - done erx, as ambien no covered by insurance  Please let pt know

## 2014-02-09 NOTE — Telephone Encounter (Signed)
Patient informed of change.

## 2014-02-09 NOTE — Telephone Encounter (Signed)
Called left message to call back 

## 2014-03-01 ENCOUNTER — Ambulatory Visit: Payer: Medicare Other | Admitting: Neurology

## 2014-04-05 ENCOUNTER — Telehealth: Payer: Self-pay | Admitting: Cardiovascular Disease

## 2014-04-11 ENCOUNTER — Telehealth (HOSPITAL_COMMUNITY): Payer: Self-pay | Admitting: *Deleted

## 2014-04-11 NOTE — Telephone Encounter (Signed)
Closed enounter °

## 2014-04-27 ENCOUNTER — Other Ambulatory Visit: Payer: Self-pay

## 2014-04-27 MED ORDER — ZOLPIDEM TARTRATE 10 MG PO TABS: 10.0000 mg | ORAL_TABLET | Freq: Every evening | ORAL | Status: DC | PRN

## 2014-04-27 NOTE — Telephone Encounter (Signed)
Faxed hardcopy to [employer].

## 2014-04-27 NOTE — Telephone Encounter (Signed)
Done hardcopy to robin  

## 2014-05-05 ENCOUNTER — Ambulatory Visit (HOSPITAL_COMMUNITY)
Admission: RE | Admit: 2014-05-05 | Discharge: 2014-05-05 | Disposition: A | Discharge status: Home / Self Care | Payer: Medicare Other | Source: Ambulatory Visit | Attending: Cardiology | Admitting: Cardiology

## 2014-05-05 ENCOUNTER — Ambulatory Visit (HOSPITAL_BASED_OUTPATIENT_CLINIC_OR_DEPARTMENT_OTHER)
Admission: RE | Admit: 2014-05-05 | Discharge: 2014-05-05 | Disposition: A | Discharge status: Home / Self Care | Payer: Medicare Other | Source: Ambulatory Visit | Attending: Cardiovascular Disease | Admitting: Cardiovascular Disease

## 2014-05-05 DIAGNOSIS — I739 Peripheral vascular disease, unspecified: Secondary | ICD-10-CM

## 2014-05-05 DIAGNOSIS — I6529 Occlusion and stenosis of unspecified carotid artery: Secondary | ICD-10-CM

## 2014-05-05 DIAGNOSIS — Z9889 Other specified postprocedural states: Secondary | ICD-10-CM | POA: Diagnosis not present

## 2014-05-05 NOTE — Progress Notes (Signed)
Carotid Duplex Completed. Daelon Dunivan, BS, RDMS, RVT  

## 2014-05-05 NOTE — Progress Notes (Signed)
Arterial Duplex Lower Ext. Completed. Yamira Papa, BS, RDMS, RVT  

## 2014-05-06 ENCOUNTER — Other Ambulatory Visit: Payer: Self-pay | Admitting: Gastroenterology

## 2014-05-10 ENCOUNTER — Encounter: Payer: Self-pay | Admitting: *Deleted

## 2014-05-11 ENCOUNTER — Encounter: Payer: Self-pay | Admitting: *Deleted

## 2014-06-01 ENCOUNTER — Other Ambulatory Visit: Payer: Self-pay | Admitting: Gastroenterology

## 2014-06-01 ENCOUNTER — Other Ambulatory Visit: Payer: Self-pay

## 2014-06-01 ENCOUNTER — Other Ambulatory Visit: Payer: Self-pay | Admitting: Internal Medicine

## 2014-06-01 MED ORDER — LISINOPRIL-HYDROCHLOROTHIAZIDE 20-25 MG PO TABS: 1.0000 | ORAL_TABLET | Freq: Every day | ORAL | Status: DC

## 2014-06-27 ENCOUNTER — Encounter: Payer: Self-pay | Admitting: Cardiovascular Disease

## 2014-06-27 ENCOUNTER — Ambulatory Visit (INDEPENDENT_AMBULATORY_CARE_PROVIDER_SITE_OTHER): Payer: Medicare Other | Admitting: Cardiovascular Disease

## 2014-06-27 VITALS — BP 126/86 | HR 74 | Ht 68.0 in | Wt 234.5 lb

## 2014-06-27 DIAGNOSIS — I10 Essential (primary) hypertension: Secondary | ICD-10-CM

## 2014-06-27 DIAGNOSIS — I6522 Occlusion and stenosis of left carotid artery: Secondary | ICD-10-CM

## 2014-06-27 DIAGNOSIS — I6529 Occlusion and stenosis of unspecified carotid artery: Secondary | ICD-10-CM

## 2014-06-27 DIAGNOSIS — I739 Peripheral vascular disease, unspecified: Secondary | ICD-10-CM

## 2014-06-27 NOTE — Progress Notes (Signed)
06/27/2014 [name] [name]   1941/01/08  [id number]  Primary Physician [name], MD Primary Cardiologist: [name] MD [name]   HPI:  The patient is a 73 year old, mildly overweight, divorced Caucasian male, father of 12, grandfather to 5 grandchildren who I last saw in the office 12 months ago.he saw [name] [name] [facility] in the office/15/15 I initially saw him June 28, 2012, for lifestyle-limiting claudication. 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 He had Dopplers in our office that suggested bilateral iliac and SFA disease. He had a negative Myoview and carotid Dopplers that did show moderately severe left ICA stenosis done because of an auscultated bruit. I angiogram'd him on July 12, 2012, and put a stent in his right external iliac as well as 2 overlapping self-expanding stents in his mid right SFA. He had 3-vessel runoff. Unfortunately, he had some thrombus form and needed to undergo aspiration thrombectomy administration of intravenous Integrilin which resulted in improvement in his blood flow. He did stay overnight in the ICU and was discharged home. Followup Dopplers showed normal ABIs on the right and he said he did have clinical improvement but had left calf claudication with angiographically documented 80% mid left SFA stenosis. On September 28, 2012, he underwent Diamondback orbital rotational atherectomy, PTA and stenting of his mid left SFA with an excellent result.his most recent Dopplers performed 05/05/14 revealed ABIs in the 0.9 range bilaterally with patent iliac and SFA stents. Carotid Dopplers performed on the same day showed stable moderate left ICA stenosis. He denies chest pain, shortness of breath or claudication.  Current Outpatient Prescriptions  Medication Sig Dispense Refill  . aspirin EC 81 MG EC tablet Take 1 tablet (81 mg total) by mouth daily.  30 tablet    .  clopidogrel (PLAVIX) 75 MG tablet Take 1 tablet by mouth  daily  90 tablet  3  . lisinopril-hydrochlorothiazide (PRINZIDE,ZESTORETIC) 20-25 MG per tablet Take 1 tablet by mouth daily.  90 tablet  3  . mesalamine (CANASA) 1000 MG suppository Place 1 suppository (1,000 mg total) rectally at bedtime.  30 suppository  0  . omega-3 acid ethyl esters (LOVAZA) 1 G capsule Take 1 capsule (1 g total) by mouth daily.  90 capsule  3  . pantoprazole (PROTONIX) 40 MG tablet Take 1 tablet (40 mg total) by mouth daily.  90 tablet  3  . sulfaSALAzine (AZULFIDINE) 500 MG tablet Take 2 tablets by mouth  twice a day  360 tablet  0  . TOBRADEX ophthalmic ointment       . traZODone (DESYREL) 50 MG tablet Take 0.5-1 tablets (25-50 mg total) by mouth at bedtime as needed for sleep.  30 tablet  3  . zolpidem (AMBIEN) 10 MG tablet Take 1 tablet (10 mg total) by mouth at bedtime as needed for sleep.  30 tablet  5   No current facility-administered medications for this visit.    Allergies  Allergen Reactions  . Contrast Media [Iodinated Diagnostic Agents]   . Lipitor [Atorvastatin] Other (See Comments)    myalgias  . Lovastatin     Muscle pain  . Statins     Legs pain    History   Social History  . Marital Status: Divorced    Spouse Name: N/A    Number of Children: 3  . Years of Education: N/A   Occupational History  . retired Administrator  Social History Main Topics  . Smoking status: Former Smoker    Quit date: 09/08/1972  . Smokeless tobacco: Current User    Types: Chew  . Alcohol Use: 3.0 oz/week    5 Cans of beer per week     Comment: 1 beer  . Drug Use: No  . Sexual Activity: Not on file   Other Topics Concern  . Not on file   Social History Narrative   Daily caffeine 1 cup coffee daily     Review of Systems: General: negative for chills, fever, night sweats or weight changes.  Cardiovascular: negative for chest pain, dyspnea on exertion, edema, orthopnea, palpitations, paroxysmal  nocturnal dyspnea or shortness of breath Dermatological: negative for rash Respiratory: negative for cough or wheezing Urologic: negative for hematuria Abdominal: negative for nausea, vomiting, diarrhea, bright red blood per rectum, melena, or hematemesis Neurologic: negative for visual changes, syncope, or dizziness All other systems reviewed and are otherwise negative except as noted above.    Blood pressure 126/86, pulse 74, height 5' 8"  (1.727 m), weight 234 lb 8 oz (106.369 kg).  General appearance: alert and no distress Neck: no adenopathy, no carotid bruit, no JVD, supple, symmetrical, trachea midline and thyroid not enlarged, symmetric, no tenderness/mass/nodules Lungs: clear to auscultation bilaterally Heart: regular rate and rhythm, S1, S2 normal, no murmur, click, rub or gallop Extremities: extremities normal, atraumatic, no cyanosis or edema  EKG not performed today  ASSESSMENT AND PLAN:   Stenosis of left carotid artery, mod to severe by dopplers Moderate left ICA stenosis which has remained stable by duplex ultrasound most recently checked 05/05/14. He is neurologically asymptomatic  Essential hypertension Controlled on current medications  HYPERLIPIDEMIA Statin intolerant with recent lipid profile performed 10/14/13 revealed a total cholesterol 236 , triglyceride level of 209, and HDL of 30.  PVD (peripheral vascular disease) with claudication, lifestyle limiting, ABI rt. 0.67, lt. 0.73 History of peripheral arterial disease with bilateral iliac and SFA disease status post stenting of his iliac arteries as well as intervention on his SFAs using diamondback orbital rotational atherectomy, PTA and stenting. His most recent lower extremity arterial Doppler studies performed in our office 04/27/14 revealed ABI .9 range bilaterally with patent iliac and SFA stents. He denies claudication.      [name] MD FACP,FACC,FAHA, [facility] 06/27/2014 12:20 PM

## 2014-06-27 NOTE — Assessment & Plan Note (Signed)
Moderate left ICA stenosis which has remained stable by duplex ultrasound most recently checked 05/05/14. He is neurologically asymptomatic

## 2014-06-27 NOTE — Assessment & Plan Note (Addendum)
History of peripheral arterial disease with bilateral iliac and SFA disease status post stenting of his iliac arteries as well as intervention on his SFAs using diamondback orbital rotational atherectomy, PTA and stenting. His most recent lower extremity arterial Doppler studies performed in our office 04/27/14 revealed ABI .9 range bilaterally with patent iliac and SFA stents. He denies claudication.

## 2014-06-27 NOTE — Assessment & Plan Note (Signed)
Controlled on current medications 

## 2014-06-27 NOTE — Assessment & Plan Note (Signed)
Statin intolerant with recent lipid profile performed 10/14/13 revealed a total cholesterol 236 , triglyceride level of 209, and HDL of 30.

## 2014-06-27 NOTE — Patient Instructions (Signed)
Your physician wants you to follow-up in: 1 year with Dr. Gwenlyn Found. You will receive a reminder letter in the mail two months in advance. If you don't receive a letter, please call our office to schedule the follow-up appointment.   Your physician has requested that you have a carotid duplex. This test is an ultrasound of the carotid arteries in your neck. It looks at blood flow through these arteries that supply the brain with blood. Allow one hour for this exam. There are no restrictions or special instructions. ** please schedule for 1 year - prior to your next office visit  Your physician has requested that you have a lower extremity arterial duplex. This test is an ultrasound of the arteries in the legs. It looks at arterial blood flow in the legs. Allow one hour for Lower Arterial scans. There are no restrictions or special instructions ** please schedule for 1 year - prior to your next office visit

## 2014-07-18 ENCOUNTER — Encounter: Payer: Self-pay | Admitting: Internal Medicine

## 2014-07-18 ENCOUNTER — Ambulatory Visit (INDEPENDENT_AMBULATORY_CARE_PROVIDER_SITE_OTHER): Payer: Medicare Other | Admitting: Internal Medicine

## 2014-07-18 ENCOUNTER — Other Ambulatory Visit (INDEPENDENT_AMBULATORY_CARE_PROVIDER_SITE_OTHER): Payer: Medicare Other

## 2014-07-18 ENCOUNTER — Other Ambulatory Visit: Payer: Self-pay | Admitting: Internal Medicine

## 2014-07-18 VITALS — BP 116/80 | HR 80 | Temp 98.3°F | Wt 234.0 lb

## 2014-07-18 DIAGNOSIS — R7302 Impaired glucose tolerance (oral): Secondary | ICD-10-CM

## 2014-07-18 DIAGNOSIS — Z0000 Encounter for general adult medical examination without abnormal findings: Secondary | ICD-10-CM

## 2014-07-18 DIAGNOSIS — Z23 Encounter for immunization: Secondary | ICD-10-CM

## 2014-07-18 DIAGNOSIS — E785 Hyperlipidemia, unspecified: Secondary | ICD-10-CM

## 2014-07-18 DIAGNOSIS — I10 Essential (primary) hypertension: Secondary | ICD-10-CM

## 2014-07-18 DIAGNOSIS — Z0189 Encounter for other specified special examinations: Secondary | ICD-10-CM

## 2014-07-18 LAB — HEPATIC FUNCTION PANEL
ALBUMIN: 3.6 g/dL (ref 3.5–5.2)
ALT: 20 U/L (ref 0–53)
AST: 18 U/L (ref 0–37)
Alkaline Phosphatase: 51 U/L (ref 39–117)
BILIRUBIN DIRECT: 0.1 mg/dL (ref 0.0–0.3)
Total Bilirubin: 0.7 mg/dL (ref 0.2–1.2)
Total Protein: 6.5 g/dL (ref 6.0–8.3)

## 2014-07-18 LAB — LIPID PANEL
Cholesterol: 256 mg/dL — ABNORMAL HIGH (ref 0–200)
HDL: 27.1 mg/dL — AB (ref >39.00)
LDL Cholesterol: 202 mg/dL — ABNORMAL HIGH (ref 0–99)
NONHDL: 228.9
TRIGLYCERIDES: 134 mg/dL (ref 0.0–149.0)
Total CHOL/HDL Ratio: 9
VLDL: 26.8 mg/dL (ref 0.0–40.0)

## 2014-07-18 LAB — BASIC METABOLIC PANEL
BUN: 16 mg/dL (ref 6–23)
CALCIUM: 9.2 mg/dL (ref 8.4–10.5)
CHLORIDE: 100 meq/L (ref 96–112)
CO2: 31 meq/L (ref 19–32)
CREATININE: 1 mg/dL (ref 0.4–1.5)
GFR: 80.62 mL/min (ref >60.00)
GLUCOSE: 111 mg/dL — AB (ref 70–99)
Potassium: 4.3 mEq/L (ref 3.5–5.1)
Sodium: 137 mEq/L (ref 135–145)

## 2014-07-18 LAB — HEMOGLOBIN A1C: Hgb A1c MFr Bld: 5.4 % (ref 4.6–6.5)

## 2014-07-18 NOTE — Progress Notes (Signed)
Subjective:    Patient ID: [name] [name], male    DOB: 12-11-1940, 73 y.o.   MRN: [id number]  HPI Here to f/u; overall doing ok,  Pt denies chest pain, increased sob or doe, wheezing, orthopnea, PND, increased LE swelling, palpitations, dizziness or syncope.  Pt denies polydipsia, polyuria, or low sugar symptoms such as weakness or confusion improved with po intake.  Pt denies new neurological symptoms such as new headache, or facial or extremity weakness or numbness.   Pt states overall good compliance with meds, has been trying to follow lower cholesterol, diet, with wt overall stable,  but little exercise however. Also taking fish oil, has been statin intol in past. Past Medical History  Diagnosis Date  . Abdominal pain, generalized 08/15/2008  . Abdominal pain, unspecified site 10/16/2009  . Anal fissure 04/24/2008  . BACK PAIN 01/10/2009  . BARRETTS ESOPHAGUS 08/16/2007  . BREAST HYPERTROPHY 02/16/2009  . CARBUNCLE, NECK 02/14/2008  . CHEST PAIN 10/10/2008  . DEPRESSION 10/16/2009  . DIVERTICULOSIS, COLON 06/28/2007  . GERD 06/28/2007  . GLUCOSE INTOLERANCE 08/15/2008  . Headache(784.0) 11/13/2009  . HERNIA, UMBILICAL W/OBSTRUCTION W/O GANGRENE 06/28/2007  . HIP PAIN, RIGHT 10/16/2009  . HYPERLIPIDEMIA 06/28/2007  . HYPERTENSION 06/28/2007  . INSOMNIA-SLEEP DISORDER-UNSPEC 10/10/2008  . LEG PAIN, BILATERAL 02/16/2009  . LOW BACK PAIN 06/28/2007  . MASTITIS 01/10/2009  . NECK PAIN 05/31/2008  . NEPHROLITHIASIS, HX OF 06/28/2007  . PROCTOSIGMOIDITIS, ULCERATIVE 1997  . TESTICULAR PAIN, RIGHT 10/25/2010  . VITAMIN D DEFICIENCY 11/13/2009  . Impaired glucose tolerance 04/13/2011  . Barrett's esophagus 1997  . Arthritis   . Cataract   . PVD (peripheral vascular disease) with claudication, lifestyle limiting, ABI rt. 0.67, lt. 0.73 07/12/2012  . S/P angioplasty with stent, after DB athrectomy to Rt. ext. iliac and Rt. SFA 07/12/2012  . HTN (hypertension) 07/12/2012  . Skin cancer     right hand  .  Stenosis of left carotid artery, mod to severe by dopplers 09/29/2012  . Full dentures    Past Surgical History  Procedure Laterality Date  . Left knee arthroscopy    . Bilateral inguinal hernia with mesh and umbilical hernia repairs  09/2007  . Anal fissure surgury    . Cataract extraction      bilateral  . Colonoscopy    . Atherectomy  09/28/2012  . Hernia repair    . Groin dissection Right 07/20/2013    Procedure: GROIN EXPLORATION, EXCISIONAL BIOPSY RIGHT INGUINAL LYMPH NODES;  Surgeon: Harl Bowie, MD;  Location: [place];  Service: General;  Laterality: Right;    reports that he quit smoking about 41 years ago. His smokeless tobacco use includes Chew. He reports that he drinks about 3.0 oz of alcohol per week. He reports that he does not use illicit drugs. family history includes COPD in his father and mother; Cerebral aneurysm in his mother. There is no history of Colon cancer, Esophageal cancer, Rectal cancer, or Stomach cancer. Allergies  Allergen Reactions  . Contrast Media [Iodinated Diagnostic Agents]   . Lipitor [Atorvastatin] Other (See Comments)    myalgias  . Lovastatin     Muscle pain  . Statins     Legs pain   Current Outpatient Prescriptions on File Prior to Visit  Medication Sig Dispense Refill  . aspirin EC 81 MG EC tablet Take 1 tablet (81 mg total) by mouth daily. 30 tablet   . clopidogrel (PLAVIX) 75 MG tablet Take 1 tablet by  mouth  daily 90 tablet 3  . lisinopril-hydrochlorothiazide (PRINZIDE,ZESTORETIC) 20-25 MG per tablet Take 1 tablet by mouth daily. 90 tablet 3  . mesalamine (CANASA) 1000 MG suppository Place 1 suppository (1,000 mg total) rectally at bedtime. 30 suppository 0  . omega-3 acid ethyl esters (LOVAZA) 1 G capsule Take 1 capsule (1 g total) by mouth daily. 90 capsule 3  . pantoprazole (PROTONIX) 40 MG tablet Take 1 tablet (40 mg total) by mouth daily. 90 tablet 3  . sulfaSALAzine (AZULFIDINE) 500 MG tablet Take 2  tablets by mouth  twice a day 360 tablet 0  . TOBRADEX ophthalmic ointment     . traZODone (DESYREL) 50 MG tablet Take 0.5-1 tablets (25-50 mg total) by mouth at bedtime as needed for sleep. 30 tablet 3  . zolpidem (AMBIEN) 10 MG tablet Take 1 tablet (10 mg total) by mouth at bedtime as needed for sleep. 30 tablet 5   No current facility-administered medications on file prior to visit.   Review of Systems  Constitutional: Negative for unusual diaphoresis or other sweats  HENT: Negative for ringing in ear Eyes: Negative for double vision or worsening visual disturbance.  Respiratory: Negative for choking and stridor.   Gastrointestinal: Negative for vomiting or other signifcant bowel change Genitourinary: Negative for hematuria or decreased urine volume.  Musculoskeletal: Negative for other MSK pain or swelling Skin: Negative for color change and worsening wound.  Neurological: Negative for tremors and numbness other than noted  Psychiatric/Behavioral: Negative for decreased concentration or agitation other than above       Objective:   Physical Exam BP 116/80 mmHg  Pulse 80  Temp(Src) 98.3 F (36.8 C) (Oral)  Wt 234 lb (106.142 kg)  SpO2 96% VS noted,  Constitutional: Pt appears well-developed, well-nourished.  HENT: Head: NCAT.  Right Ear: External ear normal.  Left Ear: External ear normal.  Eyes: . Pupils are equal, round, and reactive to light. Conjunctivae and EOM are normal Neck: Normal range of motion. Neck supple.  Cardiovascular: Normal rate and regular rhythm.   Pulmonary/Chest: Effort normal and breath sounds normal.  Abd:  Soft, NT, ND, + BS Neurological: Pt is alert. Not confused , motor grossly intact Skin: Skin is warm. No rash Psychiatric: Pt behavior is normal. No agitation.     Assessment & Plan:

## 2014-07-18 NOTE — Assessment & Plan Note (Signed)
stable overall by history and exam, recent data reviewed with pt, and pt to continue medical treatment as before,  to f/u any worsening symptoms or concerns Lab Results  Component Value Date   [name] 80 10/18/2012   For f/u lab, consider lipid clinic for pcsk9 inhibitor

## 2014-07-18 NOTE — Assessment & Plan Note (Signed)
stable overall by history and exam, recent data reviewed with pt, and pt to continue medical treatment as before,  to f/u any worsening symptoms or concerns BP Readings from Last 3 Encounters:  07/18/14 116/80  06/27/14 126/86  01/10/14 120/80

## 2014-07-18 NOTE — Patient Instructions (Signed)

## 2014-07-18 NOTE — Progress Notes (Signed)
Pre visit review using our clinic review tool, if applicable. No additional management support is needed unless otherwise documented below in the visit note. 

## 2014-07-18 NOTE — Assessment & Plan Note (Signed)
stable overall by history and exam, recent data reviewed with pt, and pt to continue medical treatment as before,  to f/u any worsening symptoms or concerns Lab Results  Component Value Date   HGBA1C 5.6 10/14/2013   For fu lab

## 2014-08-17 ENCOUNTER — Encounter (HOSPITAL_COMMUNITY): Payer: Self-pay | Admitting: Cardiovascular Disease

## 2014-08-18 ENCOUNTER — Ambulatory Visit (INDEPENDENT_AMBULATORY_CARE_PROVIDER_SITE_OTHER): Payer: Medicare Other | Admitting: Pharmacist Clinician (PhC)/ Clinical Pharmacy Specialist

## 2014-08-18 VITALS — Ht 68.0 in | Wt 233.8 lb

## 2014-08-18 DIAGNOSIS — E785 Hyperlipidemia, unspecified: Secondary | ICD-10-CM

## 2014-08-18 MED ORDER — EZETIMIBE 10 MG PO TABS: 10.0000 mg | ORAL_TABLET | Freq: Every day | ORAL | Status: DC

## 2014-08-18 NOTE — Patient Instructions (Signed)
Start taking Zetia 1/2 tablet daily for 8-10 days then increase to 1 tablet daily.  If you develop more sever muscle pains, stop and call in   Work on some changes in your diet.  Try to cut eggs down to no more than 4 eggs/day.  Eat less sausage and bacon, eat more Raisin Bran.   Eat more chicken and [place] in your sandwiches.  Try to switch to whole grain breads.    We will repeat cholesterol labs in 3 months.

## 2014-08-20 ENCOUNTER — Encounter: Payer: Self-pay | Admitting: Pharmacist Clinician (PhC)/ Clinical Pharmacy Specialist

## 2014-08-20 NOTE — Progress Notes (Signed)
08/20/2014 CHAITANYA AMEDEE [birth year] [id number]   HPI:  [name] [name] is a 73 y.o. male patient of Dr Gwenlyn Found, who presents today for a lipid clinic evaluation.  His medical history is significant for lifestyle-limiting claudication.  In November of 2013 he had a stent placed in the fight external iliac artery and  2 overlapping stents in the mid righ SFA.  In January of 2014 he had stents place in the mid left SFA.  Most recent dopplers in August of 2015 showed ABIs of 0.9 bilaterally with patent stent.  Meds: fish oil 2 gm/day   Intolerant: statins (myalgia)  Diet: includes 2 eggs/day, sausage, bacon, bologna sandwiches on white bread, pork chops.    Exercise:  None due to constant leg pain  Labs:  07/2014   TC 256, TG 134, HDL 27.1, LDL 202 10/2013     TC 236, TG 208, HDL 30.4, LDL 184.7  10/2012     TC 130, TG 86, HDL 33, LDL 80  (on atorvastatin 18m)   Current Outpatient Prescriptions  Medication Sig Dispense Refill  . aspirin EC 81 MG EC tablet Take 1 tablet (81 mg total) by mouth daily. 30 tablet   . clopidogrel (PLAVIX) 75 MG tablet Take 1 tablet by mouth  daily 90 tablet 3  . ezetimibe (ZETIA) 10 MG tablet Take 1 tablet (10 mg total) by mouth daily. 30 tablet 3  . lisinopril-hydrochlorothiazide (PRINZIDE,ZESTORETIC) 20-25 MG per tablet Take 1 tablet by mouth daily. 90 tablet 3  . mesalamine (CANASA) 1000 MG suppository Place 1 suppository (1,000 mg total) rectally at bedtime. 30 suppository 0  . omega-3 acid ethyl esters (LOVAZA) 1 G capsule Take 1 capsule (1 g total) by mouth daily. (Patient taking differently: Take 2 g by mouth daily. ) 90 capsule 3  . pantoprazole (PROTONIX) 40 MG tablet Take 1 tablet (40 mg total) by mouth daily. 90 tablet 3  . sulfaSALAzine (AZULFIDINE) 500 MG tablet Take 2 tablets by mouth  twice a day 360 tablet 0  . TOBRADEX ophthalmic ointment     . traZODone (DESYREL) 50 MG tablet Take 0.5-1 tablets (25-50 mg total) by mouth at bedtime as needed for  sleep. 30 tablet 3  . zolpidem (AMBIEN) 10 MG tablet Take 1 tablet (10 mg total) by mouth at bedtime as needed for sleep. 30 tablet 5   No current facility-administered medications for this visit.    Allergies  Allergen Reactions  . Contrast Media [Iodinated Diagnostic Agents]   . Lipitor [Atorvastatin] Other (See Comments)    myalgias  . Lovastatin     Muscle pain  . Statins     Legs pain    Past Medical History  Diagnosis Date  . Abdominal pain, generalized 08/15/2008  . Abdominal pain, unspecified site 10/16/2009  . Anal fissure 04/24/2008  . BACK PAIN 01/10/2009  . BARRETTS ESOPHAGUS 08/16/2007  . BREAST HYPERTROPHY 02/16/2009  . CARBUNCLE, NECK 02/14/2008  . CHEST PAIN 10/10/2008  . DEPRESSION 10/16/2009  . DIVERTICULOSIS, COLON 06/28/2007  . GERD 06/28/2007  . GLUCOSE INTOLERANCE 08/15/2008  . Headache(784.0) 11/13/2009  . HERNIA, UMBILICAL W/OBSTRUCTION W/O GANGRENE 06/28/2007  . HIP PAIN, RIGHT 10/16/2009  . HYPERLIPIDEMIA 06/28/2007  . HYPERTENSION 06/28/2007  . INSOMNIA-SLEEP DISORDER-UNSPEC 10/10/2008  . LEG PAIN, BILATERAL 02/16/2009  . LOW BACK PAIN 06/28/2007  . MASTITIS 01/10/2009  . NECK PAIN 05/31/2008  . NEPHROLITHIASIS, HX OF 06/28/2007  . PROCTOSIGMOIDITIS, ULCERATIVE 1997  . TESTICULAR PAIN, RIGHT 10/25/2010  . VITAMIN  D DEFICIENCY 11/13/2009  . Impaired glucose tolerance 04/13/2011  . Barrett's esophagus 1997  . Arthritis   . Cataract   . PVD (peripheral vascular disease) with claudication, lifestyle limiting, ABI rt. 0.67, lt. 0.73 07/12/2012  . S/P angioplasty with stent, after DB athrectomy to Rt. ext. iliac and Rt. SFA 07/12/2012  . HTN (hypertension) 07/12/2012  . Skin cancer     right hand  . Stenosis of left carotid artery, mod to severe by dopplers 09/29/2012  . Full dentures     Height 5' 8"  (1.727 m), weight 233 lb 12.8 oz (106.051 kg).   Tommy Medal PharmD CPP [facility]

## 2014-08-20 NOTE — Assessment & Plan Note (Signed)
[name] [name] has severely high LDL cholesterol.  Just 2 years ago it was well controlled on atorvastatin 20 mg daily.  He states that the statins caused his leg pain, but as the pain is still with him and he hasn't had any statin drugs since February 2015, when he tried a month of lovastatin 14m, I don't believe it is statin intolerance.  He describes the pain as pins and needles and burning, sounding more likely related to his claudication or even neuropathy.  Regardless, he is unable to add a statin at this time.  I will have his start at Zetia 5 mg once daily for 2-3 weeks, then increase to 1 tablet daily, assuming no increase in leg pain.  He might qualify for PCSK-9 therapy with his ASCVD, but I am not sure he is willing to self-inject.  We also had a long discussion on the need to adjust his diet to more healthy foods.  He needs to decrease his egg consumption to no more than twice weekly, and start trading out the bologna for sliced [place] chicken.  Suggested trying whole grain bread instead of white bread, and eas off the pork products as well.   He is to call if unable to tolerate the Zetia.  Otherwise I will have him repeat labs in 2-3 months.

## 2014-09-21 ENCOUNTER — Telehealth: Payer: Self-pay | Admitting: Cardiovascular Disease

## 2014-09-21 MED ORDER — EZETIMIBE 10 MG PO TABS: 10.0000 mg | ORAL_TABLET | Freq: Every day | ORAL | Status: DC

## 2014-09-21 NOTE — Telephone Encounter (Signed)
[name] [name] is calling because he was told to call back if the medication is working Zetia 92m and the medication is working . Needs a prescription sent to [facility] . 90 day supply . Please call if you have any questions ..[name]  Thanks

## 2014-09-21 NOTE — Telephone Encounter (Signed)
I spoke with patient. He has experienced some relief in the leg pain that he was feeling while taking the statin.  For that reason, we will continue the zetia.  I did sent in the RX to [facility].  As far as the burning sensation, I advised patient to contact his primary care doctor.  The discomfort sounds like some type of neuropathy pain.  Patient was agreeable and voiced understanding.

## 2014-09-21 NOTE — Telephone Encounter (Signed)
Also is wanting to know about the burn feeling in his legs . Wants to know if there is something that can be prescribe for for him .

## 2014-10-10 ENCOUNTER — Ambulatory Visit (INDEPENDENT_AMBULATORY_CARE_PROVIDER_SITE_OTHER): Payer: Medicare Other | Admitting: Internal Medicine

## 2014-10-10 ENCOUNTER — Other Ambulatory Visit (INDEPENDENT_AMBULATORY_CARE_PROVIDER_SITE_OTHER): Payer: Medicare Other

## 2014-10-10 ENCOUNTER — Encounter: Payer: Self-pay | Admitting: Internal Medicine

## 2014-10-10 VITALS — BP 130/64 | HR 87 | Temp 98.9°F | Ht 68.0 in | Wt 227.5 lb

## 2014-10-10 DIAGNOSIS — I10 Essential (primary) hypertension: Secondary | ICD-10-CM

## 2014-10-10 DIAGNOSIS — M79605 Pain in left leg: Secondary | ICD-10-CM | POA: Diagnosis not present

## 2014-10-10 DIAGNOSIS — M79604 Pain in right leg: Secondary | ICD-10-CM

## 2014-10-10 DIAGNOSIS — E538 Deficiency of other specified B group vitamins: Secondary | ICD-10-CM

## 2014-10-10 DIAGNOSIS — F329 Major depressive disorder, single episode, unspecified: Secondary | ICD-10-CM | POA: Diagnosis not present

## 2014-10-10 DIAGNOSIS — F32A Depression, unspecified: Secondary | ICD-10-CM

## 2014-10-10 LAB — VITAMIN B12: VITAMIN B 12: 132 pg/mL — AB (ref 211–911)

## 2014-10-10 MED ORDER — GABAPENTIN 300 MG PO CAPS: 300.0000 mg | ORAL_CAPSULE | Freq: Three times a day (TID) | ORAL | Status: DC

## 2014-10-10 NOTE — Assessment & Plan Note (Signed)
By hx most c/w bilat neuropathic pain, no back pain and neuro exam not helpufl, ok for gabapentin trial as well as NCS per neurology, check B12, consider LS spine MRI but no recent back pain worsening, consider neuro referral

## 2014-10-10 NOTE — Progress Notes (Signed)
Subjective:    Patient ID: [name] [name], male    DOB: [birth year], 74 y.o.   MRN: [id number]  HPI   Here to f/u, has persistent moderate burning bilat LE pain below knees only, ongoing for several years,  Has hx of myalgias with statins so did not pursue eval previouly -  Has seen Dr Olevia Perches - rx for lipitor due to hx of myalgia and leg pain with previous statin,, suggested not circulatoin related but  ? Nerve related pain per pt.  No recent lowre back pain. Has hx of LS spine mri 2007 with ls spine djd, and ? Left l3 nerve root. Nothing seems to make better.  Nothing makes worse. Asking for med tx.   Makes getting to sleep difficult despite sleep pill  Denies worsening depressive symptoms, suicidal ideation, or panic; Past Medical History  Diagnosis Date  . Abdominal pain, generalized 08/15/2008  . Abdominal pain, unspecified site 10/16/2009  . Anal fissure 04/24/2008  . BACK PAIN 01/10/2009  . BARRETTS ESOPHAGUS 08/16/2007  . BREAST HYPERTROPHY 02/16/2009  . CARBUNCLE, NECK 02/14/2008  . CHEST PAIN 10/10/2008  . DEPRESSION 10/16/2009  . DIVERTICULOSIS, COLON 06/28/2007  . GERD 06/28/2007  . GLUCOSE INTOLERANCE 08/15/2008  . Headache(784.0) 11/13/2009  . HERNIA, UMBILICAL W/OBSTRUCTION W/O GANGRENE 06/28/2007  . HIP PAIN, RIGHT 10/16/2009  . HYPERLIPIDEMIA 06/28/2007  . HYPERTENSION 06/28/2007  . INSOMNIA-SLEEP DISORDER-UNSPEC 10/10/2008  . LEG PAIN, BILATERAL 02/16/2009  . LOW BACK PAIN 06/28/2007  . MASTITIS 01/10/2009  . NECK PAIN 05/31/2008  . NEPHROLITHIASIS, HX OF 06/28/2007  . PROCTOSIGMOIDITIS, ULCERATIVE 1997  . TESTICULAR PAIN, RIGHT 10/25/2010  . VITAMIN D DEFICIENCY 11/13/2009  . Impaired glucose tolerance 04/13/2011  . Barrett's esophagus 1997  . Arthritis   . Cataract   . PVD (peripheral vascular disease) with claudication, lifestyle limiting, ABI rt. 0.67, lt. 0.73 07/12/2012  . S/P angioplasty with stent, after DB athrectomy to Rt. ext. iliac and Rt. SFA 07/12/2012  . HTN  (hypertension) 07/12/2012  . Skin cancer     right hand  . Stenosis of left carotid artery, mod to severe by dopplers 09/29/2012  . Full dentures    Past Surgical History  Procedure Laterality Date  . Left knee arthroscopy    . Bilateral inguinal hernia with mesh and umbilical hernia repairs  09/2007  . Anal fissure surgury    . Cataract extraction      bilateral  . Colonoscopy    . Atherectomy  09/28/2012  . Hernia repair    . Groin dissection Right 07/20/2013    Procedure: GROIN EXPLORATION, EXCISIONAL BIOPSY RIGHT INGUINAL LYMPH NODES;  Surgeon: Harl Bowie, MD;  Location: [place];  Service: General;  Laterality: Right;  . Atherectomy N/A 07/12/2012    Procedure: ATHERECTOMY;  Surgeon: [name], MD;  Location: [facility] CATH LAB;  Service: Cardiovascular;  Laterality: N/A;  . Atherectomy N/A 09/28/2012    Procedure: ATHERECTOMY;  Surgeon: [name], MD;  Location: [facility] CATH LAB;  Service: Cardiovascular;  Laterality: N/A;  . Percutaneous stent intervention  09/28/2012    Procedure: PERCUTANEOUS STENT INTERVENTION;  Surgeon: [name], MD;  Location: [facility] CATH LAB;  Service: Cardiovascular;;    reports that he quit smoking about 42 years ago. His smokeless tobacco use includes Chew. He reports that he drinks about 3.0 oz of alcohol per week. He reports that he does not use illicit drugs. family history includes COPD in his father and mother;  Cerebral aneurysm in his mother. There is no history of Colon cancer, Esophageal cancer, Rectal cancer, or Stomach cancer. Allergies  Allergen Reactions  . Contrast Media [Iodinated Diagnostic Agents]   . Lipitor [Atorvastatin] Other (See Comments)    myalgias  . Lovastatin     Muscle pain  . Statins     Legs pain   Current Outpatient Prescriptions on File Prior to Visit  Medication Sig Dispense Refill  . aspirin EC 81 MG EC tablet Take 1 tablet (81 mg total) by mouth daily. 30 tablet   . clopidogrel  (PLAVIX) 75 MG tablet Take 1 tablet by mouth  daily 90 tablet 3  . ezetimibe (ZETIA) 10 MG tablet Take 1 tablet (10 mg total) by mouth daily. 90 tablet 3  . lisinopril-hydrochlorothiazide (PRINZIDE,ZESTORETIC) 20-25 MG per tablet Take 1 tablet by mouth daily. 90 tablet 3  . pantoprazole (PROTONIX) 40 MG tablet Take 1 tablet (40 mg total) by mouth daily. 90 tablet 3  . sulfaSALAzine (AZULFIDINE) 500 MG tablet Take 2 tablets by mouth  twice a day 360 tablet 0  . zolpidem (AMBIEN) 10 MG tablet Take 1 tablet (10 mg total) by mouth at bedtime as needed for sleep. 30 tablet 5   No current facility-administered medications on file prior to visit.    Review of Systems  Constitutional: Negative for unusual diaphoresis or other sweats  HENT: Negative for ringing in ear Eyes: Negative for double vision or worsening visual disturbance.  Respiratory: Negative for choking and stridor.   Gastrointestinal: Negative for vomiting or other signifcant bowel change Genitourinary: Negative for hematuria or decreased urine volume.  Musculoskeletal: Negative for other MSK pain or swelling Skin: Negative for color change and worsening wound.  Neurological: Negative for tremors and numbness other than noted  Psychiatric/Behavioral: Negative for decreased concentration or agitation other than above       Objective:   Physical Exam BP 130/64 mmHg  Pulse 87  Temp(Src) 98.9 F (37.2 C) (Oral)  Ht 5' 8"  (1.727 m)  Wt 227 lb 8 oz (103.193 kg)  BMI 34.60 kg/m2  SpO2 95% VS noted,  Constitutional: Pt appears well-developed, well-nourished.  HENT: Head: NCAT.  Right Ear: External ear normal.  Left Ear: External ear normal.  Eyes: . Pupils are equal, round, and reactive to light. Conjunctivae and EOM are normal Neck: Normal range of motion. Neck supple.  Cardiovascular: Normal rate and regular rhythm.   Pulmonary/Chest: Effort normal and breath sounds without rales or wheezing.  Abd:  Soft, NT, ND, +  BS Neurological: Pt is alert. Not confused , motor grossly intact, sens intact to LT per pt Skin: Skin is warm. No rash Psychiatric: Pt behavior is normal. No agitation. not depressed affect    Assessment & Plan:

## 2014-10-10 NOTE — Progress Notes (Signed)
Pre visit review using our clinic review tool, if applicable. No additional management support is needed unless otherwise documented below in the visit note. 

## 2014-10-10 NOTE — Addendum Note (Signed)
Addended by: Biagio Borg on: 10/10/2014 05:14 PM   Modules accepted: Orders

## 2014-10-10 NOTE — Assessment & Plan Note (Signed)
stable overall by history and exam, recent data reviewed with pt, and pt to continue medical treatment as before,  to f/u any worsening symptoms or concerns BP Readings from Last 3 Encounters:  10/10/14 130/64  07/18/14 116/80  06/27/14 126/86

## 2014-10-10 NOTE — Patient Instructions (Addendum)
Please take all new medication as prescribed - the gabapentin 300 mg -   START with one at night only for 3 nights, then twice per day for 3 days, THEN 3 times per day after that  Please continue all other medications as before, and refills have been done if requested.  Please have the pharmacy call with any other refills you may need.  Please keep your appointments with your specialists as you may have planned  You will be contacted regarding the referral for: Nerve test for the legs (Nerve conduction study)  Please go to the LAB in the Basement (turn left off the elevator) for the tests to be done today - just the B12 level

## 2014-10-10 NOTE — Assessment & Plan Note (Signed)
stable overall by history and exam, recent data reviewed with pt, and pt to continue medical treatment as before,  to f/u any worsening symptoms or concerns Lab Results  Component Value Date   WBC 8.2 10/14/2013   HGB 16.1 10/14/2013   HCT 48.0 10/14/2013   PLT 211.0 10/14/2013   GLUCOSE 111* 07/18/2014   CHOL 256* 07/18/2014   TRIG 134.0 07/18/2014   HDL 27.10* 07/18/2014   LDLDIRECT 184.7 10/14/2013   LDLCALC 202* 07/18/2014   ALT 20 07/18/2014   AST 18 07/18/2014   NA 137 07/18/2014   K 4.3 07/18/2014   CL 100 07/18/2014   CREATININE 1.0 07/18/2014   BUN 16 07/18/2014   CO2 31 07/18/2014   TSH 1.41 10/14/2013   PSA 1.29 10/14/2013   HGBA1C 5.4 07/18/2014   MICROALBUR 1.4 10/14/2010

## 2014-10-11 ENCOUNTER — Encounter: Payer: Self-pay | Admitting: Internal Medicine

## 2014-10-11 ENCOUNTER — Other Ambulatory Visit (INDEPENDENT_AMBULATORY_CARE_PROVIDER_SITE_OTHER): Payer: Medicare Other

## 2014-10-11 ENCOUNTER — Telehealth: Payer: Self-pay | Admitting: Internal Medicine

## 2014-10-11 DIAGNOSIS — E538 Deficiency of other specified B group vitamins: Secondary | ICD-10-CM

## 2014-10-11 DIAGNOSIS — I10 Essential (primary) hypertension: Secondary | ICD-10-CM

## 2014-10-11 HISTORY — DX: Deficiency of other specified B group vitamins: E53.8

## 2014-10-11 LAB — LIPID PANEL
CHOLESTEROL: 175 mg/dL (ref 0–200)
HDL: 33.7 mg/dL — ABNORMAL LOW (ref >39.00)
LDL Cholesterol: 108 mg/dL — ABNORMAL HIGH (ref 0–99)
NonHDL: 141.3
Total CHOL/HDL Ratio: 5
Triglycerides: 168 mg/dL — ABNORMAL HIGH (ref 0.0–149.0)
VLDL: 33.6 mg/dL (ref 0.0–40.0)

## 2014-10-11 NOTE — Telephone Encounter (Signed)
Called back to return call.

## 2014-10-11 NOTE — Telephone Encounter (Signed)
Pt stated he is returning your call.

## 2014-10-12 ENCOUNTER — Ambulatory Visit (INDEPENDENT_AMBULATORY_CARE_PROVIDER_SITE_OTHER): Payer: Medicare Other

## 2014-10-12 ENCOUNTER — Other Ambulatory Visit: Payer: Self-pay | Admitting: *Deleted

## 2014-10-12 DIAGNOSIS — E538 Deficiency of other specified B group vitamins: Secondary | ICD-10-CM

## 2014-10-12 DIAGNOSIS — M79604 Pain in right leg: Secondary | ICD-10-CM

## 2014-10-12 DIAGNOSIS — M79605 Pain in left leg: Principal | ICD-10-CM

## 2014-10-12 MED ORDER — CYANOCOBALAMIN 1000 MCG/ML IJ SOLN
1000.0000 ug | Freq: Once | INTRAMUSCULAR | Status: AC
  Administered 2014-10-12: 1000 ug via INTRAMUSCULAR

## 2014-10-16 ENCOUNTER — Other Ambulatory Visit: Payer: Self-pay | Admitting: Gastroenterology

## 2014-10-18 ENCOUNTER — Other Ambulatory Visit: Payer: Self-pay | Admitting: *Deleted

## 2014-10-18 ENCOUNTER — Telehealth: Payer: Self-pay | Admitting: Gastroenterology

## 2014-10-18 MED ORDER — SULFASALAZINE 500 MG PO TABS: ORAL_TABLET | ORAL | Status: DC

## 2014-10-18 MED ORDER — ZOLPIDEM TARTRATE 10 MG PO TABS: 10.0000 mg | ORAL_TABLET | Freq: Every evening | ORAL | Status: DC | PRN

## 2014-10-18 NOTE — Telephone Encounter (Signed)
Prescription sent to patient's mail order pharmacy and told to keep appt for any further refills.

## 2014-10-18 NOTE — Telephone Encounter (Signed)
Done hardcopy to cindy

## 2014-10-18 NOTE — Telephone Encounter (Signed)
rx faxed to pharmacy

## 2014-11-14 ENCOUNTER — Ambulatory Visit (INDEPENDENT_AMBULATORY_CARE_PROVIDER_SITE_OTHER): Payer: Medicare Other | Admitting: Gastroenterology

## 2014-11-14 ENCOUNTER — Encounter: Payer: Self-pay | Admitting: Gastroenterology

## 2014-11-14 VITALS — BP 128/60 | HR 80 | Ht 68.0 in | Wt 227.2 lb

## 2014-11-14 DIAGNOSIS — K51911 Ulcerative colitis, unspecified with rectal bleeding: Secondary | ICD-10-CM | POA: Diagnosis not present

## 2014-11-14 DIAGNOSIS — K2270 Barrett's esophagus without dysplasia: Secondary | ICD-10-CM

## 2014-11-14 MED ORDER — PANTOPRAZOLE SODIUM 40 MG PO TBEC: DELAYED_RELEASE_TABLET | ORAL | Status: DC

## 2014-11-14 MED ORDER — MESALAMINE 800 MG PO TBEC: 2.0000 | DELAYED_RELEASE_TABLET | Freq: Three times a day (TID) | ORAL | Status: DC

## 2014-11-14 NOTE — Patient Instructions (Signed)
We have sent the following prescriptions to your mail in pharmacy: Asacol and Protonix.  If you have not heard from your mail in pharmacy within 1 week or if you have not received your medication in the mail, please contact us at [phone] so we may find out why.  Stop taking your sulfasalizine when you receive your Asacol in the mail.  Call back in 2 months if your symptoms are no better.   Please follow up with in Dr. Fuller Plan in 6 months. Thank you for choosing me and [facility].  [name]. Dagoberto Ligas., MD., Marval Regal

## 2014-11-14 NOTE — Progress Notes (Signed)
    History of Present Illness: This is a 74 year old male returning for follow-up of ulcerative colitis and Barrett's esophagus. His been having frequent episodes of small volume rectal bleeding with bowel movements. The symptoms have slightly worsened in severity over the past few months. He does not feel his ulcerative colitis is under as good as control since changing to sulfasalazine. He requested the change to sulfasalazine due to cost of other 5-ASA medications. His reflux symptoms are under excellent control on daily pantoprazole.  Current Medications, Allergies, Past Medical History, Past Surgical History, Family History and Social History were reviewed in [employer] record.  Physical Exam: General: Well developed , well nourished, no acute distress Head: Normocephalic and atraumatic Eyes:  sclerae anicteric, EOMI Ears: Normal auditory acuity Mouth: No deformity or lesions Lungs: Clear throughout to auscultation Heart: Regular rate and rhythm; no murmurs, rubs or bruits Abdomen: Soft, non tender and non distended. No masses, hepatosplenomegaly or hernias noted. Normal Bowel sounds Musculoskeletal: Symmetrical with no gross deformities  Pulses:  Normal pulses noted Extremities: No clubbing, cyanosis, edema or deformities noted Neurological: Alert oriented x 4, grossly nonfocal Psychological:  Alert and cooperative. Normal mood and affect  Assessment and Recommendations:  1. Long history of ulcerative colitis which has frequent activity with rectal bleeding.. Activity has increased since changing to sulfasalazine. Proctosigmoiditis was noted on colonoscopy last year. Discontinue sulfasalazine and begin Asacol 1.6 g 3 times a day. Additionally he was offered the option of using 5-ASA suppository or enema. He prefers to try Asacol alone at this time which I feel is reasonable. REV in 6 months. Patient is advised to call if his symptoms are not under improved  control with in the next 6-8 weeks. Due for two-year interval surveillance colonoscopy in 11/2015.  2. Barrett's esophagus-last EGD March 2015. Continue pantoprazole 40 mg daily. 3. History of adenomatous colon polyps. Surveillance colonoscopy as above. 3. Chronic antiplatelet therapy with Plavix 4. Peripheral vascular disease status post lower extremity angioplasties and stents 5. Hypertension 6. Hyperlipidemia

## 2014-11-16 ENCOUNTER — Telehealth: Payer: Self-pay | Admitting: Gastroenterology

## 2014-11-16 NOTE — Telephone Encounter (Signed)
Lialda 2.4g daily

## 2014-11-16 NOTE — Telephone Encounter (Signed)
Left a message for patient to return my call. 

## 2014-11-16 NOTE — Telephone Encounter (Signed)
Patient's daughter states the Asacol HD is not covered at all by insurance. She said she called the insurance and there preferred medicines are Apriso and Lialda. Which one do you want patient to take and at what dose?

## 2014-11-17 MED ORDER — MESALAMINE 1.2 G PO TBEC: 2.4000 g | DELAYED_RELEASE_TABLET | Freq: Every day | ORAL | Status: DC

## 2014-11-17 NOTE — Telephone Encounter (Signed)
Per patient's daughter ok to leave voicemail that Doristine Johns was sent to her fathers pharmacy. Told her to call if she does not receive medication in the mail or if they have any questions.

## 2014-11-27 ENCOUNTER — Ambulatory Visit (INDEPENDENT_AMBULATORY_CARE_PROVIDER_SITE_OTHER): Payer: Medicare Other | Admitting: Neurology

## 2014-11-27 DIAGNOSIS — M79605 Pain in left leg: Secondary | ICD-10-CM | POA: Diagnosis not present

## 2014-11-27 DIAGNOSIS — M79604 Pain in right leg: Secondary | ICD-10-CM | POA: Diagnosis not present

## 2014-11-27 DIAGNOSIS — M5417 Radiculopathy, lumbosacral region: Secondary | ICD-10-CM

## 2014-11-27 NOTE — Procedures (Signed)
[facility]  [address], [address]  [place], [place] [address] Tel: [phone] Fax:  [phone] Test Date:  11/27/2014  Patient: [name] [name] DOB: 06/01/1941 Physician: Narda Amber, DO  Sex: Male Height: 5' 8"  Ref Phys: [name]  ID#: [id number] Temp: 32.0C Technician: Laureen Ochs R. NCS T.   Patient Complaints: Patient is a 74 year old male here for evaluation of burning and stinging of the feet for several years.  NCV & EMG Findings: Extensive electrodiagnostic testing of the right lower extremity and additional studies of the left shows:  1. Bilateral sural and superficial peroneal sensory responses are within normal limits.  2. Bilateral tibial and peroneal motor responses are within normal limits.  3. Bilateral H reflex studies are within normal limits.  4. Chronic motor axon loss changes are seen affecting the rectus femoris and abductor longus muscles on the left only, without accompanied active denervation. Similar findings are not present in the right lower extremity.  Impression: 1. Chronic L3-4 radiculopathy affecting the left lower extremity, mild in degree electrically. 2. There is no evidence of a generalized sensorimotor polyneuropathy affecting the lower extremities. ___________________________ Narda Amber, DO    Nerve Conduction Studies Anti Sensory Summary Table   Site NR Peak (ms) Norm Peak (ms) P-T Amp (V) Norm P-T Amp  Left Sup Peroneal Anti Sensory (Ant Lat Mall)  32C  12 cm    3.4 <4.6 6.6 >3  Right Sup Peroneal Anti Sensory (Ant Lat Mall)  32C  12 cm    3.2 <4.6 5.7 >3  Left Sural Anti Sensory (Lat Mall)  Calf    3.9 <4.6 7.8 >3  Right Sural Anti Sensory (Lat Mall)  32C  Calf    4.0 <4.6 6.4 >3   Motor Summary Table   Site NR Onset (ms) Norm Onset (ms) O-P Amp (mV) Norm O-P Amp Site1 Site2 Delta-0 (ms) Dist (cm) Vel (m/s) Norm Vel (m/s)  Left Peroneal Motor (Ext Dig Brev)  32C  Ankle    4.0 <6.0 6.6 >2.5 B Fib Ankle  6.9 30.0 43 >40  B Fib    10.9  5.7  Poplt B Fib 2.0 9.0 45 >40  Poplt    12.9  5.6         Right Peroneal Motor (Ext Dig Brev)  32C  Ankle    4.1 <6.0 5.3 >2.5 B Fib Ankle 6.8 30.0 44 >40  B Fib    10.9  4.1  Poplt B Fib 2.1 12.0 57 >40  Poplt    13.0  3.7         Left Peroneal TA Motor (Tib Ant)  32C  Fib Head    2.1 <4.5 5.6 >3 Poplit Fib Head 2.0 11.0 55 >40  Poplit    4.1  5.2         Right Peroneal TA Motor (Tib Ant)  32C  Fib Head    1.6 <4.5 6.5 >3 Poplit Fib Head 2.6 13.0 50 >40  Poplit    4.2  5.4         Left Tibial Motor (Abd Hall Brev)  32C  Ankle    3.4 <6.0 8.9 >4 Knee Ankle 8.2 36.0 44 >40  Knee    11.6  6.9         Right Tibial Motor (Abd Hall Brev)  32C  Ankle    3.7 <6.0 7.4 >4 Knee Ankle 8.1 38.0 47 >40  Knee    11.8  5.5          H Reflex Studies   NR H-Lat (ms) Lat Norm (ms) L-R H-Lat (ms)  Left Tibial (Gastroc)     32.52 <35 1.49  Right Tibial (Gastroc)  32C     34.01 <35 1.49   EMG   Side Muscle Ins Act Fibs Psw Fasc Number Recrt Dur Dur. Amp Amp. Poly Poly. Comment  Right AntTibialis Nml Nml Nml Nml Nml Nml Nml Nml Nml Nml Nml Nml N/A  Right Gastroc Nml Nml Nml Nml Nml Nml Nml Nml Nml Nml Nml Nml N/A  Right Flex Dig Long Nml Nml Nml Nml Nml Nml Nml Nml Nml Nml Nml Nml N/A  Right RectFemoris Nml Nml Nml Nml Nml Nml Nml Nml Nml Nml Nml Nml N/A  Right GluteusMed Nml Nml Nml Nml Nml Nml Nml Nml Nml Nml Nml Nml N/A  Left AntTibialis Nml Nml Nml Nml Nml Nml Nml Nml Nml Nml Nml Nml N/A  Left RectFemoris Nml Nml Nml Nml 1- Mod-R Some 1+ Few 1+ Nml Nml N/A  Left AdductorLong Nml Nml Nml Nml 1- Mod-R Some 1+ Few 1+ Nml Nml N/A  Left GluteusMed Nml Nml Nml Nml Nml Nml Nml Nml Nml Nml Nml Nml N/A  Left BicepsFemS Nml Nml Nml Nml Nml Nml Nml Nml Nml Nml Nml Nml N/A      Waveforms:

## 2014-12-25 DIAGNOSIS — L720 Epidermal cyst: Secondary | ICD-10-CM | POA: Diagnosis not present

## 2014-12-25 DIAGNOSIS — L570 Actinic keratosis: Secondary | ICD-10-CM | POA: Diagnosis not present

## 2014-12-25 DIAGNOSIS — C44629 Squamous cell carcinoma of skin of left upper limb, including shoulder: Secondary | ICD-10-CM | POA: Diagnosis not present

## 2014-12-28 ENCOUNTER — Other Ambulatory Visit: Payer: Self-pay | Admitting: Dermatology

## 2014-12-28 DIAGNOSIS — L720 Epidermal cyst: Secondary | ICD-10-CM | POA: Diagnosis not present

## 2015-01-16 ENCOUNTER — Encounter: Payer: Self-pay | Admitting: Internal Medicine

## 2015-01-16 ENCOUNTER — Ambulatory Visit (INDEPENDENT_AMBULATORY_CARE_PROVIDER_SITE_OTHER): Payer: Medicare Other | Admitting: Internal Medicine

## 2015-01-16 ENCOUNTER — Other Ambulatory Visit (INDEPENDENT_AMBULATORY_CARE_PROVIDER_SITE_OTHER): Payer: Medicare Other

## 2015-01-16 VITALS — BP 128/60 | HR 67 | Temp 98.5°F | Wt 230.1 lb

## 2015-01-16 DIAGNOSIS — I10 Essential (primary) hypertension: Secondary | ICD-10-CM | POA: Diagnosis not present

## 2015-01-16 DIAGNOSIS — R7302 Impaired glucose tolerance (oral): Secondary | ICD-10-CM | POA: Diagnosis not present

## 2015-01-16 DIAGNOSIS — Z0000 Encounter for general adult medical examination without abnormal findings: Secondary | ICD-10-CM

## 2015-01-16 DIAGNOSIS — E785 Hyperlipidemia, unspecified: Secondary | ICD-10-CM | POA: Diagnosis not present

## 2015-01-16 LAB — URINALYSIS, ROUTINE W REFLEX MICROSCOPIC
Bilirubin Urine: NEGATIVE
HGB URINE DIPSTICK: NEGATIVE
KETONES UR: NEGATIVE
LEUKOCYTES UA: NEGATIVE
NITRITE: NEGATIVE
RBC / HPF: NONE SEEN (ref 0–?)
Specific Gravity, Urine: 1.015 (ref 1.000–1.030)
Total Protein, Urine: NEGATIVE
Urine Glucose: NEGATIVE
Urobilinogen, UA: 0.2 (ref 0.0–1.0)
WBC, UA: NONE SEEN (ref 0–?)
pH: 6 (ref 5.0–8.0)

## 2015-01-16 LAB — BASIC METABOLIC PANEL
BUN: 14 mg/dL (ref 6–23)
CO2: 29 mEq/L (ref 19–32)
CREATININE: 0.93 mg/dL (ref 0.40–1.50)
Calcium: 9.3 mg/dL (ref 8.4–10.5)
Chloride: 102 mEq/L (ref 96–112)
GFR: 84.51 mL/min (ref >60.00)
Glucose, Bld: 122 mg/dL — ABNORMAL HIGH (ref 70–99)
POTASSIUM: 4.4 meq/L (ref 3.5–5.1)
SODIUM: 137 meq/L (ref 135–145)

## 2015-01-16 LAB — CBC WITH DIFFERENTIAL/PLATELET
BASOS ABS: 0.1 10*3/uL (ref 0.0–0.1)
BASOS PCT: 0.6 % (ref 0.0–3.0)
EOS ABS: 0.3 10*3/uL (ref 0.0–0.7)
Eosinophils Relative: 3.4 % (ref 0.0–5.0)
HCT: 47.6 % (ref 39.0–52.0)
HEMOGLOBIN: 16.3 g/dL (ref 13.0–17.0)
LYMPHS PCT: 10.1 % — AB (ref 12.0–46.0)
Lymphs Abs: 0.9 10*3/uL (ref 0.7–4.0)
MCHC: 34.2 g/dL (ref 30.0–36.0)
MCV: 88.5 fl (ref 78.0–100.0)
MONOS PCT: 9.1 % (ref 3.0–12.0)
Monocytes Absolute: 0.8 10*3/uL (ref 0.1–1.0)
NEUTROS ABS: 6.8 10*3/uL (ref 1.4–7.7)
Neutrophils Relative %: 76.8 % (ref 43.0–77.0)
Platelets: 212 10*3/uL (ref 150.0–400.0)
RBC: 5.38 Mil/uL (ref 4.22–5.81)
RDW: 14.4 % (ref 11.5–15.5)
WBC: 8.9 10*3/uL (ref 4.0–10.5)

## 2015-01-16 LAB — LIPID PANEL
Cholesterol: 151 mg/dL (ref 0–200)
HDL: 33.3 mg/dL — ABNORMAL LOW (ref >39.00)
LDL Cholesterol: 96 mg/dL (ref 0–99)
NonHDL: 117.7
TRIGLYCERIDES: 107 mg/dL (ref 0.0–149.0)
Total CHOL/HDL Ratio: 5
VLDL: 21.4 mg/dL (ref 0.0–40.0)

## 2015-01-16 LAB — HEPATIC FUNCTION PANEL
ALBUMIN: 4 g/dL (ref 3.5–5.2)
ALK PHOS: 58 U/L (ref 39–117)
ALT: 24 U/L (ref 0–53)
AST: 19 U/L (ref 0–37)
Bilirubin, Direct: 0.1 mg/dL (ref 0.0–0.3)
Total Bilirubin: 0.6 mg/dL (ref 0.2–1.2)
Total Protein: 6.5 g/dL (ref 6.0–8.3)

## 2015-01-16 LAB — HEMOGLOBIN A1C: HEMOGLOBIN A1C: 5.7 % (ref 4.6–6.5)

## 2015-01-16 LAB — TSH: TSH: 1.28 u[IU]/mL (ref 0.35–4.50)

## 2015-01-16 LAB — PSA: PSA: 1.11 ng/mL (ref 0.10–4.00)

## 2015-01-16 NOTE — Patient Instructions (Signed)

## 2015-01-16 NOTE — Progress Notes (Signed)
Pre visit review using our clinic review tool, if applicable. No additional management support is needed unless otherwise documented below in the visit note. 

## 2015-01-16 NOTE — Assessment & Plan Note (Signed)

## 2015-01-16 NOTE — Assessment & Plan Note (Signed)
stable overall by history and exam, recent data reviewed with pt, and pt to continue medical treatment as before,  to f/u any worsening symptoms or concerns Lab Results  Component Value Date   HGBA1C 5.4 07/18/2014   For f/u lab with recent small 3 lb wt gain

## 2015-01-16 NOTE — Progress Notes (Signed)
Subjective:    Patient ID: [name] [name], male    DOB: 18-Jul-1941, 74 y.o.   MRN: [id number]  HPI  Here for wellness and f/u;  Overall doing ok;  Pt denies Chest pain, worsening SOB, DOE, wheezing, orthopnea, PND, worsening LE edema, palpitations, dizziness or syncope.  Pt denies neurological change such as new headache, facial or extremity weakness.  Pt denies polydipsia, polyuria, or low sugar symptoms. Pt states overall good compliance with treatment and medications, good tolerability, and has been trying to follow appropriate diet.  Pt denies worsening depressive symptoms, suicidal ideation or panic. No fever, night sweats, wt loss, loss of appetite, or other constitutional symptoms.  Pt states good ability with ADL's, has low fall risk, home safety reviewed and adequate, no other significant changes in hearing or vision, and only occasionally active with exercise. Tolerating current statin and meds  No complaints Past Medical History  Diagnosis Date  . Abdominal pain, generalized 08/15/2008  . Abdominal pain, unspecified site 10/16/2009  . Anal fissure 04/24/2008  . BACK PAIN 01/10/2009  . BARRETTS ESOPHAGUS 08/16/2007  . BREAST HYPERTROPHY 02/16/2009  . CARBUNCLE, NECK 02/14/2008  . CHEST PAIN 10/10/2008  . DEPRESSION 10/16/2009  . DIVERTICULOSIS, COLON 06/28/2007  . GERD 06/28/2007  . GLUCOSE INTOLERANCE 08/15/2008  . Headache(784.0) 11/13/2009  . HERNIA, UMBILICAL W/OBSTRUCTION W/O GANGRENE 06/28/2007  . HIP PAIN, RIGHT 10/16/2009  . HYPERLIPIDEMIA 06/28/2007  . HYPERTENSION 06/28/2007  . INSOMNIA-SLEEP DISORDER-UNSPEC 10/10/2008  . LEG PAIN, BILATERAL 02/16/2009  . LOW BACK PAIN 06/28/2007  . MASTITIS 01/10/2009  . NECK PAIN 05/31/2008  . NEPHROLITHIASIS, HX OF 06/28/2007  . PROCTOSIGMOIDITIS, ULCERATIVE 1997  . TESTICULAR PAIN, RIGHT 10/25/2010  . VITAMIN D DEFICIENCY 11/13/2009  . Impaired glucose tolerance 04/13/2011  . Barrett's esophagus 1997  . Arthritis   . Cataract   . PVD (peripheral  vascular disease) with claudication, lifestyle limiting, ABI rt. 0.67, lt. 0.73 07/12/2012  . S/P angioplasty with stent, after DB athrectomy to Rt. ext. iliac and Rt. SFA 07/12/2012  . HTN (hypertension) 07/12/2012  . Skin cancer     right hand  . Stenosis of left carotid artery, mod to severe by dopplers 09/29/2012  . Full dentures   . B12 deficiency 10/11/2014   Past Surgical History  Procedure Laterality Date  . Left knee arthroscopy    . Bilateral inguinal hernia with mesh and umbilical hernia repairs  09/2007  . Anal fissure surgury    . Cataract extraction      bilateral  . Colonoscopy    . Atherectomy  09/28/2012  . Hernia repair    . Groin dissection Right 07/20/2013    Procedure: GROIN EXPLORATION, EXCISIONAL BIOPSY RIGHT INGUINAL LYMPH NODES;  Surgeon: Harl Bowie, MD;  Location: [place];  Service: General;  Laterality: Right;  . Atherectomy N/A 07/12/2012    Procedure: ATHERECTOMY;  Surgeon: [name], MD;  Location[facility] CATH LAB;  Service: Cardiovascular;  Laterality: N/A;  . Atherectomy N/A 09/28/2012    Procedure: ATHERECTOMY;  Surgeon: [name], MD;  Location: [facility] CATH LAB;  Service: Cardiovascular;  Laterality: N/A;  . Percutaneous stent intervention  09/28/2012    Procedure: PERCUTANEOUS STENT INTERVENTION;  Surgeon: [name], MD;  Location: [facility] CATH LAB;  Service: Cardiovascular;;    reports that he quit smoking about 42 years ago. His smokeless tobacco use includes Chew. He reports that he drinks about 3.0 oz of alcohol per week. He reports that  he does not use illicit drugs. family history includes COPD in his father and mother; Cerebral aneurysm in his mother. There is no history of Colon cancer, Esophageal cancer, Rectal cancer, or Stomach cancer. Allergies  Allergen Reactions  . Contrast Media [Iodinated Diagnostic Agents]   . Lipitor [Atorvastatin] Other (See Comments)    myalgias  . Lovastatin     Muscle pain  . Statins      Legs pain   Current Outpatient Prescriptions on File Prior to Visit  Medication Sig Dispense Refill  . aspirin EC 81 MG EC tablet Take 1 tablet (81 mg total) by mouth daily. 30 tablet   . clopidogrel (PLAVIX) 75 MG tablet Take 1 tablet by mouth  daily 90 tablet 3  . ezetimibe (ZETIA) 10 MG tablet Take 1 tablet (10 mg total) by mouth daily. 90 tablet 3  . lisinopril-hydrochlorothiazide (PRINZIDE,ZESTORETIC) 20-25 MG per tablet Take 1 tablet by mouth daily. 90 tablet 3  . mesalamine (LIALDA) 1.2 G EC tablet Take 2 tablets (2.4 g total) by mouth daily with breakfast. 180 tablet 3  . pantoprazole (PROTONIX) 40 MG tablet Take 1 tablet (40 mg total) by mouth daily. 90 tablet 3  . zolpidem (AMBIEN) 10 MG tablet Take 1 tablet (10 mg total) by mouth at bedtime as needed for sleep. 30 tablet 5  . sulfaSALAzine (AZULFIDINE) 500 MG tablet Take 2 tablets by mouth  twice a day (Patient not taking: Reported on 01/16/2015) 360 tablet 0   No current facility-administered medications on file prior to visit.    Review of Systems Constitutional: Negative for increased diaphoresis, other activity, appetite or siginficant weight change other than noted HENT: Negative for worsening hearing loss, ear pain, facial swelling, mouth sores and neck stiffness.   Eyes: Negative for other worsening pain, redness or visual disturbance.  Respiratory: Negative for shortness of breath and wheezing  Cardiovascular: Negative for chest pain and palpitations.  Gastrointestinal: Negative for diarrhea, blood in stool, abdominal distention or other pain Genitourinary: Negative for hematuria, flank pain or change in urine volume.  Musculoskeletal: Negative for myalgias or other joint complaints.  Skin: Negative for color change and wound or drainage.  Neurological: Negative for syncope and numbness. other than noted Hematological: Negative for adenopathy. or other swelling Psychiatric/Behavioral: Negative for hallucinations,  SI, self-injury, decreased concentration or other worsening agitation.      Objective:   Physical Exam BP 128/60 mmHg  Pulse 67  Temp(Src) 98.5 F (36.9 C) (Oral)  Wt 230 lb 1.3 oz (104.364 kg)  SpO2 97% VS noted,  Constitutional: Pt is oriented to person, place, and time. Appears well-developed and well-nourished, in no significant distress Head: Normocephalic and atraumatic.  Right Ear: External ear normal.  Left Ear: External ear normal.  Nose: Nose normal.  Mouth/Throat: Oropharynx is clear and moist.  Eyes: Conjunctivae and EOM are normal. Pupils are equal, round, and reactive to light.  Neck: Normal range of motion. Neck supple. No JVD present. No tracheal deviation present or significant neck LA or mass Cardiovascular: Normal rate, regular rhythm, normal heart sounds and intact distal pulses.   Pulmonary/Chest: Effort normal and breath sounds without rales or wheezing  Abdominal: Soft. Bowel sounds are normal. NT. No HSM  Musculoskeletal: Normal range of motion. Exhibits no edema.  Lymphadenopathy:  Has no cervical adenopathy.  Neurological: Pt is alert and oriented to person, place, and time. Pt has normal reflexes. No cranial nerve deficit. Motor grossly intact Skin: Skin is warm and dry. No  rash noted.  Psychiatric:  Has normal mood and affect. Behavior is normal.     Assessment & Plan:

## 2015-01-31 ENCOUNTER — Telehealth: Payer: Self-pay | Admitting: Gastroenterology

## 2015-01-31 NOTE — Telephone Encounter (Signed)
[name] [name] (patient's daughter) states they went to get Lialda refilled and the insurance company is wanting to charge her father 700 dollars for [name] [name] now for 90 day supply due to a "insurance gap". [name] [name] states she is not sure what that means but cannot afford this. Told her I have never heard of a insurance gap. She states she has plenty of of sulfasalazine medicine left if he can take that instead. Told patient I read Dr. Lynne Leader office note from 11/16/14 and it states patient's UC was not under control while on this medicine. Told [name] [name] to contact [employer] and find out what medication that patient can be switched to that is covered right now and call me back with a decision. [name] [name] agreed and verbalized understanding.

## 2015-02-13 NOTE — Progress Notes (Signed)
Patient ID: [name] [name], male   DOB: Jun 28, 1941, 74 y.o.   MRN: [id number]   Received fax from [facility] that patient assistance for Lialda was approved for patient through 09/08/15. Patient has been notified per letter.

## 2015-02-23 ENCOUNTER — Telehealth: Payer: Self-pay | Admitting: Cardiovascular Disease

## 2015-02-23 NOTE — Telephone Encounter (Signed)
Closed encounter °

## 2015-02-27 ENCOUNTER — Telehealth: Payer: Self-pay | Admitting: Cardiovascular Disease

## 2015-02-27 NOTE — Telephone Encounter (Signed)
[name] (daughter) is calling about a letter in which Dr. Gwenlyn Found is needing to sign for [name] [name] to get [place] , because he is not able to afford thru [employer]. [name] He is unable to take any of the statins medication. Please call .[name] Can lmsg .Marland KitchenMarland KitchenWants to know if the letter has been sign and sent back and when it was sent back . Thanks

## 2015-02-27 NOTE — Telephone Encounter (Signed)
Left message for dtr, letter was signed and mailed out this morning.

## 2015-03-20 ENCOUNTER — Telehealth: Payer: Self-pay | Admitting: Cardiovascular Disease

## 2015-03-20 NOTE — Telephone Encounter (Signed)
Informed daughter , unable to locate the applications RN asked  Dr Kennon Holter nurse, CMA, RN looked on [name] [name]'s desk.  Daughter states she spoke to the company and that  they states they mailed the application back to our address. She states she will call the company and start all over.

## 2015-03-20 NOTE — Telephone Encounter (Signed)
Application for Zetia was sent back,because doctor's signature was missing. [employer] said this was sent back here on June 29th. She is checking to see if this was received.Please call and let her know whether it was received or not.

## 2015-04-11 ENCOUNTER — Other Ambulatory Visit: Payer: Self-pay

## 2015-04-11 MED ORDER — ZOLPIDEM TARTRATE 10 MG PO TABS: 10.0000 mg | ORAL_TABLET | Freq: Every evening | ORAL | Status: DC | PRN

## 2015-04-11 NOTE — Telephone Encounter (Signed)
Done hardcopy to Dahlia  

## 2015-04-11 NOTE — Telephone Encounter (Signed)
Rx faxed to pharmacy  

## 2015-04-25 ENCOUNTER — Telehealth: Payer: Self-pay | Admitting: *Deleted

## 2015-04-25 MED ORDER — CLOPIDOGREL BISULFATE 75 MG PO TABS: ORAL_TABLET | ORAL | Status: DC

## 2015-04-25 MED ORDER — LISINOPRIL-HYDROCHLOROTHIAZIDE 20-25 MG PO TABS: 1.0000 | ORAL_TABLET | Freq: Every day | ORAL | Status: DC

## 2015-04-25 NOTE — Telephone Encounter (Signed)
Left msg on triage needing refills on two meds. Called pt back spoke with daughter verify which meds. Inform will send to [facility]...[name] [name]

## 2015-05-24 ENCOUNTER — Ambulatory Visit (INDEPENDENT_AMBULATORY_CARE_PROVIDER_SITE_OTHER): Payer: Medicare Other | Admitting: Internal Medicine

## 2015-05-24 ENCOUNTER — Ambulatory Visit (INDEPENDENT_AMBULATORY_CARE_PROVIDER_SITE_OTHER)
Admission: RE | Admit: 2015-05-24 | Discharge: 2015-05-24 | Disposition: A | Discharge status: Home / Self Care | Payer: Medicare Other | Source: Ambulatory Visit | Attending: Internal Medicine | Admitting: Internal Medicine

## 2015-05-24 ENCOUNTER — Encounter: Payer: Self-pay | Admitting: Internal Medicine

## 2015-05-24 VITALS — BP 130/62 | HR 71 | Wt 230.0 lb

## 2015-05-24 DIAGNOSIS — I10 Essential (primary) hypertension: Secondary | ICD-10-CM

## 2015-05-24 DIAGNOSIS — R079 Chest pain, unspecified: Secondary | ICD-10-CM | POA: Diagnosis not present

## 2015-05-24 DIAGNOSIS — R7302 Impaired glucose tolerance (oral): Secondary | ICD-10-CM | POA: Diagnosis not present

## 2015-05-24 DIAGNOSIS — E785 Hyperlipidemia, unspecified: Secondary | ICD-10-CM

## 2015-05-24 DIAGNOSIS — Z0189 Encounter for other specified special examinations: Secondary | ICD-10-CM

## 2015-05-24 DIAGNOSIS — Z0000 Encounter for general adult medical examination without abnormal findings: Secondary | ICD-10-CM

## 2015-05-24 NOTE — Progress Notes (Signed)
Pre visit review using our clinic review tool, if applicable. No additional management support is needed unless otherwise documented below in the visit note. 

## 2015-05-24 NOTE — Assessment & Plan Note (Addendum)
ECG reviewed as per emr, atypical, doubt cardiac but cant completely r/o, pt adamant refuses stress testing, for cxr, cont PPI, f/u card as planned  Note:  Total time for pt hx, exam, review of record with pt in the room, determination of diagnoses and plan for further eval and tx is > 40 min, with over 50% spent in coordination and counseling of patient

## 2015-05-24 NOTE — Progress Notes (Signed)
Subjective:    Patient ID: [name] [name], male    DOB: 05-Sep-1941, 74 y.o.   MRN: [id number]  HPI  Here with chest congestion and ant discomfort - pressure like, recurrent , with sensation of "wave" to the neck and head diffusely "for a split second" when the pressure eases off.  "feels like a cold when the chest builds up" but has no fever, ST, cough and Pt denies  increased sob or doe, wheezing, orthopnea, PND, increased LE swelling, palpitations, dizziness or syncope, no diaphoresis or n/v as well.  Ongoing for 2 mo, intermittent, not more frequent, 2 or 3 episodes every 2-3 days.   Pt denies fever, wt loss, night sweats, loss of appetite, or other constitutional symptoms  Pt denies new neurological symptoms such as new headache, or facial or extremity weakness or numbness   Pt denies polydipsia, polyuria. Has seen Dr Olevia Perches several yrs ago, recalls neg stress test (no date or other details).  Coincidently has appt next mo with Dr Gwenlyn Found, to also see Dr Justin Mend later this month.   Takes protonix daily. Pt denies polydipsia, polyuria,  Past Medical History  Diagnosis Date  . Abdominal pain, generalized 08/15/2008  . Abdominal pain, unspecified site 10/16/2009  . Anal fissure 04/24/2008  . BACK PAIN 01/10/2009  . BARRETTS ESOPHAGUS 08/16/2007  . BREAST HYPERTROPHY 02/16/2009  . CARBUNCLE, NECK 02/14/2008  . CHEST PAIN 10/10/2008  . DEPRESSION 10/16/2009  . DIVERTICULOSIS, COLON 06/28/2007  . GERD 06/28/2007  . GLUCOSE INTOLERANCE 08/15/2008  . Headache(784.0) 11/13/2009  . HERNIA, UMBILICAL W/OBSTRUCTION W/O GANGRENE 06/28/2007  . HIP PAIN, RIGHT 10/16/2009  . HYPERLIPIDEMIA 06/28/2007  . HYPERTENSION 06/28/2007  . INSOMNIA-SLEEP DISORDER-UNSPEC 10/10/2008  . LEG PAIN, BILATERAL 02/16/2009  . LOW BACK PAIN 06/28/2007  . MASTITIS 01/10/2009  . NECK PAIN 05/31/2008  . NEPHROLITHIASIS, HX OF 06/28/2007  . PROCTOSIGMOIDITIS, ULCERATIVE 1997  . TESTICULAR PAIN, RIGHT 10/25/2010  . VITAMIN D DEFICIENCY  11/13/2009  . Impaired glucose tolerance 04/13/2011  . Barrett's esophagus 1997  . Arthritis   . Cataract   . PVD (peripheral vascular disease) with claudication, lifestyle limiting, ABI rt. 0.67, lt. 0.73 07/12/2012  . S/P angioplasty with stent, after DB athrectomy to Rt. ext. iliac and Rt. SFA 07/12/2012  . HTN (hypertension) 07/12/2012  . Skin cancer     right hand  . Stenosis of left carotid artery, mod to severe by dopplers 09/29/2012  . Full dentures   . B12 deficiency 10/11/2014   Past Surgical History  Procedure Laterality Date  . Left knee arthroscopy    . Bilateral inguinal hernia with mesh and umbilical hernia repairs  09/2007  . Anal fissure surgury    . Cataract extraction      bilateral  . Colonoscopy    . Atherectomy  09/28/2012  . Hernia repair    . Groin dissection Right 07/20/2013    Procedure: GROIN EXPLORATION, EXCISIONAL BIOPSY RIGHT INGUINAL LYMPH NODES;  Surgeon: Harl Bowie, MD;  Location: [place];  Service: General;  Laterality: Right;  . Atherectomy N/A 07/12/2012    Procedure: ATHERECTOMY;  Surgeon: [name], MD;  Location: [facility] CATH LAB;  Service: Cardiovascular;  Laterality: N/A;  . Atherectomy N/A 09/28/2012    Procedure: ATHERECTOMY;  Surgeon: [name], MD;  Location: [facility] CATH LAB;  Service: Cardiovascular;  Laterality: N/A;  . Percutaneous stent intervention  09/28/2012    Procedure: PERCUTANEOUS STENT INTERVENTION;  Surgeon: [name], MD;  Location: [place] CATH LAB;  Service: Cardiovascular;;    reports that he quit smoking about 42 years ago. His smokeless tobacco use includes Chew. He reports that he drinks about 3.0 oz of alcohol per week. He reports that he does not use illicit drugs. family history includes COPD in his father and mother; Cerebral aneurysm in his mother. There is no history of Colon cancer, Esophageal cancer, Rectal cancer, or Stomach cancer. Allergies  Allergen Reactions  . Contrast Media  [Iodinated Diagnostic Agents]   . Lipitor [Atorvastatin] Other (See Comments)    myalgias  . Lovastatin     Muscle pain  . Statins     Legs pain   Current Outpatient Prescriptions on File Prior to Visit  Medication Sig Dispense Refill  . aspirin EC 81 MG EC tablet Take 1 tablet (81 mg total) by mouth daily. 30 tablet   . clopidogrel (PLAVIX) 75 MG tablet Take 1 tablet by mouth  daily 90 tablet 2  . ezetimibe (ZETIA) 10 MG tablet Take 1 tablet (10 mg total) by mouth daily. 90 tablet 3  . lisinopril-hydrochlorothiazide (PRINZIDE,ZESTORETIC) 20-25 MG per tablet Take 1 tablet by mouth daily. 90 tablet 2  . mesalamine (LIALDA) 1.2 G EC tablet Take 2 tablets (2.4 g total) by mouth daily with breakfast. 180 tablet 3  . pantoprazole (PROTONIX) 40 MG tablet Take 1 tablet (40 mg total) by mouth daily. 90 tablet 3  . zolpidem (AMBIEN) 10 MG tablet Take 1 tablet (10 mg total) by mouth at bedtime as needed for sleep. 30 tablet 5   No current facility-administered medications on file prior to visit.    Review of Systems  Constitutional: Negative for unusual diaphoresis or night sweats HENT: Negative for ringing in ear or discharge Eyes: Negative for double vision or worsening visual disturbance.  Respiratory: Negative for choking and stridor.   Gastrointestinal: Negative for vomiting or other signifcant bowel change Genitourinary: Negative for hematuria or change in urine volume.  Musculoskeletal: Negative for other MSK pain or swelling Skin: Negative for color change and worsening wound.  Neurological: Negative for tremors and numbness other than noted  Psychiatric/Behavioral: Negative for decreased concentration or agitation other than above       Objective:   Physical Exam BP 130/62 mmHg  Pulse 71  Wt 230 lb (104.327 kg)  SpO2 98% VS noted,  Constitutional: Pt appears in no significant distress HENT: Head: NCAT.  Right Ear: External ear normal.  Left Ear: External ear normal.  Eyes:  . Pupils are equal, round, and reactive to light. Conjunctivae and EOM are normal Neck: Normal range of motion. Neck supple.  Cardiovascular: Normal rate and regular rhythm.   Pulmonary/Chest: Effort normal and breath sounds without rales or wheezing.  Abd:  Soft, NT, ND, + BS Neurological: Pt is alert. Not confused , motor grossly intact Skin: Skin is warm. No rash, no LE edema Psychiatric: Pt behavior is normal. No agitation.     Assessment & Plan:

## 2015-05-24 NOTE — Patient Instructions (Addendum)
Your EKG was OK today  Please call if you change your mind about having the NON-walking stress test done  Please continue all other medications as before, and refills have been done if requested.  Please have the pharmacy call with any other refills you may need.  Please keep your appointments with your specialists as you may have planned  Please go to the XRAY Department in the Basement (go straight as you get off the elevator) for the x-ray testing  You will be contacted by phone if any changes need to be made immediately.  Otherwise, you will receive a letter about your results with an explanation, but please check with MyChart first.  Please remember to sign up for MyChart if you have not done so, as this will be important to you in the future with finding out test results, communicating by private email, and scheduling acute appointments online when needed.  Please return in 6 months, or sooner if needed, with Lab testing done 3-5 days before

## 2015-05-25 ENCOUNTER — Encounter: Payer: Self-pay | Admitting: Internal Medicine

## 2015-05-27 NOTE — Assessment & Plan Note (Signed)
stable overall by history and exam, recent data reviewed with pt, and pt to continue medical treatment as before,  to f/u any worsening symptoms or concerns BP Readings from Last 3 Encounters:  05/24/15 130/62  01/16/15 128/60  11/14/14 128/60

## 2015-05-27 NOTE — Assessment & Plan Note (Signed)
stable overall by history and exam, recent data reviewed with pt, and pt to continue medical treatment as before,  to f/u any worsening symptoms or concerns Lab Results  Component Value Date   LDLCALC 96 01/16/2015   , for f/u labs

## 2015-05-27 NOTE — Assessment & Plan Note (Signed)
stable overall by history and exam, recent data reviewed with pt, and pt to continue medical treatment as before,  to f/u any worsening symptoms or concerns Lab Results  Component Value Date   HGBA1C 5.7 01/16/2015

## 2015-05-29 ENCOUNTER — Ambulatory Visit (INDEPENDENT_AMBULATORY_CARE_PROVIDER_SITE_OTHER): Payer: Medicare Other | Admitting: Cardiovascular Disease

## 2015-05-29 ENCOUNTER — Encounter: Payer: Self-pay | Admitting: Cardiovascular Disease

## 2015-05-29 VITALS — BP 120/62 | HR 91 | Ht 68.0 in | Wt 231.8 lb

## 2015-05-29 DIAGNOSIS — I6529 Occlusion and stenosis of unspecified carotid artery: Secondary | ICD-10-CM | POA: Diagnosis not present

## 2015-05-29 DIAGNOSIS — I739 Peripheral vascular disease, unspecified: Secondary | ICD-10-CM

## 2015-05-29 DIAGNOSIS — E785 Hyperlipidemia, unspecified: Secondary | ICD-10-CM | POA: Diagnosis not present

## 2015-05-29 DIAGNOSIS — I6522 Occlusion and stenosis of left carotid artery: Secondary | ICD-10-CM

## 2015-05-29 NOTE — Assessment & Plan Note (Signed)
History of hyperlipidemia intolerant to statin drugs currently on Zetia with his most recent lipid profile performed 01/16/15 revealing total cholesterol 151, LDL 96 and HDL of 33.

## 2015-05-29 NOTE — Progress Notes (Signed)
05/29/2015 [name] [name]   01-Jan-1941  [id number]  Primary Physician [name] Cower, MD Primary Cardiologist: [name] Harp MD [name] [name]   HPI:  The patient is a 74 year old, mildly overweight, divorced Caucasian male, father of 60, grandfather to 5 grandchildren who I last saw in the office 12 months ago.I initially saw him June 28, 2012, for lifestyle-limiting claudication. 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 He had Dopplers in our office that suggested bilateral iliac and SFA disease. He had a negative Myoview and carotid Dopplers that did show moderately severe left ICA stenosis done because of an auscultated bruit. I angiogram'd him on July 12, 2012, and put a stent in his right external iliac as well as 2 overlapping self-expanding stents in his mid right SFA. He had 3-vessel runoff. Unfortunately, he had some thrombus form and needed to undergo aspiration thrombectomy administration of intravenous Integrilin which resulted in improvement in his blood flow. He did stay overnight in the ICU and was discharged home. Followup Dopplers showed normal ABIs on the right and he said he did have clinical improvement but had left calf claudication with angiographically documented 80% mid left SFA stenosis. On September 28, 2012, he underwent Diamondback orbital rotational atherectomy, PTA and stenting of his mid left SFA with an excellent result.his most recent Dopplers performed 05/05/14 revealed ABIs in the 0.9 range bilaterally with patent iliac and SFA stents. Carotid Dopplers performed on the same day showed stable moderate left ICA stenosis. He denies chest pain, shortness of breath or claudication.   Current Outpatient Prescriptions  Medication Sig Dispense Refill  . aspirin EC 81 MG EC tablet Take 1 tablet (81 mg total) by mouth daily. 30 tablet   . clopidogrel (PLAVIX) 75 MG tablet Take 1 tablet by  mouth  daily 90 tablet 2  . ezetimibe (ZETIA) 10 MG tablet Take 1 tablet (10 mg total) by mouth daily. 90 tablet 3  . lisinopril-hydrochlorothiazide (PRINZIDE,ZESTORETIC) 20-25 MG per tablet Take 1 tablet by mouth daily. 90 tablet 2  . mesalamine (LIALDA) 1.2 G EC tablet Take 2 tablets (2.4 g total) by mouth daily with breakfast. 180 tablet 3  . pantoprazole (PROTONIX) 40 MG tablet Take 1 tablet (40 mg total) by mouth daily. 90 tablet 3  . zolpidem (AMBIEN) 10 MG tablet Take 1 tablet (10 mg total) by mouth at bedtime as needed for sleep. 30 tablet 5   No current facility-administered medications for this visit.    Allergies  Allergen Reactions  . Contrast Media [Iodinated Diagnostic Agents]   . Lipitor [Atorvastatin] Other (See Comments)    myalgias  . Lovastatin     Muscle pain  . Statins     Legs pain    Social History   Social History  . Marital Status: Divorced    Spouse Name: N/A  . Number of Children: 3  . Years of Education: N/A   Occupational History  . retired Administrator    Social History Main Topics  . Smoking status: Former Smoker    Quit date: 09/08/1972  . Smokeless tobacco: Current User    Types: Chew  . Alcohol Use: 3.0 oz/week    5 Cans of beer per week     Comment: 1 beer  . Drug Use: No  . Sexual Activity: Not on file   Other Topics Concern  . Not on file   Social History Narrative  Daily caffeine 1 cup coffee daily     Review of Systems: General: negative for chills, fever, night sweats or weight changes.  Cardiovascular: negative for chest pain, dyspnea on exertion, edema, orthopnea, palpitations, paroxysmal nocturnal dyspnea or shortness of breath Dermatological: negative for rash Respiratory: negative for cough or wheezing Urologic: negative for hematuria Abdominal: negative for nausea, vomiting, diarrhea, bright red blood per rectum, melena, or hematemesis Neurologic: negative for visual changes, syncope, or dizziness All other  systems reviewed and are otherwise negative except as noted above.    Blood pressure 120/62, pulse 91, height 5' 8"  (1.727 m), weight 231 lb 12.8 oz (105.144 kg).  General appearance: alert and no distress Neck: no adenopathy, no carotid bruit, no JVD, supple, symmetrical, trachea midline and thyroid not enlarged, symmetric, no tenderness/mass/nodules Lungs: clear to auscultation bilaterally Heart: regular rate and rhythm, S1, S2 normal, no murmur, click, rub or gallop Extremities: extremities normal, atraumatic, no cyanosis or edema Pulses: 2+ and symmetric Skin: Skin color, texture, turgor normal. No rashes or lesions Neurologic: Grossly normal  EKG not performed today  ASSESSMENT AND PLAN:   Stenosis of left carotid artery, mod to severe by dopplers History of moderate left ICA stenosis by duplex ultrasound last checked 05/05/14. He does have a soft bruit on that side. He is neurologically symptomatic. We'll recheck carotid Doppler studies  PVD (peripheral vascular disease) with claudication, lifestyle limiting, ABI rt. 0.67, lt. 0.73 History of peripheral arterial disease status post right external iliac and SFA intervention 07/12/12 with staged intervention of his left SFA 09/28/12 with diamondback orbital rotational atherectomy and nitinol self expanding stent. His most recent Dopplers of his lower extremities performed 05/05/14 revealed a right ABI 0.91 and left of 0.86. He denies claudication. He remains on dual antiplatelet therapy. We will recheck lower extremity arterial Doppler studies.  Hyperlipidemia History of hyperlipidemia intolerant to statin drugs currently on Zetia with his most recent lipid profile performed 01/16/15 revealing total cholesterol 151, LDL 96 and HDL of 33.      [name] Harp MD FACP,FACC,FAHA, [facility] 05/29/2015 9:56 AM

## 2015-05-29 NOTE — Assessment & Plan Note (Signed)
History of peripheral arterial disease status post right external iliac and SFA intervention 07/12/12 with staged intervention of his left SFA 09/28/12 with diamondback orbital rotational atherectomy and nitinol self expanding stent. His most recent Dopplers of his lower extremities performed 05/05/14 revealed a right ABI 0.91 and left of 0.86. He denies claudication. He remains on dual antiplatelet therapy. We will recheck lower extremity arterial Doppler studies.

## 2015-05-29 NOTE — Patient Instructions (Signed)
Medication Instructions:  Your physician recommends that you continue on your current medications as directed. Please refer to the Current Medication list given to you today.   Labwork: NONE  Testing/Procedures: Your physician has requested that you have a lower extremity arterial doppler- During this test, ultrasound is used to evaluate arterial blood flow in the legs. Allow approximately one hour for this exam.   Your physician has requested that you have a carotid duplex. This test is an ultrasound of the carotid arteries in your neck. It looks at blood flow through these arteries that supply the brain with blood. Allow one hour for this exam. There are no restrictions or special instructions.   Follow-Up: Your physician wants you to follow-up in: 12 months with Dr. Gwenlyn Found. You will receive a reminder letter in the mail two months in advance. If you don't receive a letter, please call our office to schedule the follow-up appointment.   Any Other Special Instructions Will Be Listed Below (If Applicable).

## 2015-05-29 NOTE — Assessment & Plan Note (Signed)
History of moderate left ICA stenosis by duplex ultrasound last checked 05/05/14. He does have a soft bruit on that side. He is neurologically symptomatic. We'll recheck carotid Doppler studies

## 2015-06-06 ENCOUNTER — Other Ambulatory Visit: Payer: Self-pay | Admitting: Cardiovascular Disease

## 2015-06-06 ENCOUNTER — Ambulatory Visit: Payer: Self-pay | Admitting: Gastroenterology

## 2015-06-06 DIAGNOSIS — I739 Peripheral vascular disease, unspecified: Secondary | ICD-10-CM

## 2015-06-13 ENCOUNTER — Ambulatory Visit (HOSPITAL_COMMUNITY)
Admission: RE | Admit: 2015-06-13 | Discharge: 2015-06-13 | Disposition: A | Discharge status: Home / Self Care | Payer: Medicare Other | Source: Ambulatory Visit | Attending: Cardiovascular Disease | Admitting: Cardiovascular Disease

## 2015-06-13 ENCOUNTER — Ambulatory Visit (HOSPITAL_BASED_OUTPATIENT_CLINIC_OR_DEPARTMENT_OTHER)
Admission: RE | Admit: 2015-06-13 | Discharge: 2015-06-13 | Disposition: A | Discharge status: Home / Self Care | Payer: Medicare Other | Source: Ambulatory Visit | Attending: Cardiovascular Disease | Admitting: Cardiovascular Disease

## 2015-06-13 DIAGNOSIS — I6523 Occlusion and stenosis of bilateral carotid arteries: Secondary | ICD-10-CM | POA: Diagnosis not present

## 2015-06-13 DIAGNOSIS — I700 Atherosclerosis of aorta: Secondary | ICD-10-CM | POA: Insufficient documentation

## 2015-06-13 DIAGNOSIS — I739 Peripheral vascular disease, unspecified: Secondary | ICD-10-CM | POA: Diagnosis not present

## 2015-06-13 DIAGNOSIS — I10 Essential (primary) hypertension: Secondary | ICD-10-CM | POA: Diagnosis not present

## 2015-06-13 DIAGNOSIS — E785 Hyperlipidemia, unspecified: Secondary | ICD-10-CM | POA: Insufficient documentation

## 2015-06-13 DIAGNOSIS — I6522 Occlusion and stenosis of left carotid artery: Secondary | ICD-10-CM

## 2015-06-13 DIAGNOSIS — I6529 Occlusion and stenosis of unspecified carotid artery: Secondary | ICD-10-CM

## 2015-06-27 ENCOUNTER — Ambulatory Visit: Payer: Medicare Other | Admitting: Cardiovascular Disease

## 2015-07-03 ENCOUNTER — Telehealth: Payer: Self-pay

## 2015-07-03 NOTE — Telephone Encounter (Signed)
Patient returned my call. Informed him that he needs to come by the office to fill out his part of the patient assistance. Patient states his daughter helps him do this stuff. He states he will come by the office to pick it up and fill it out. Then he will bring it back. Informed patient that was fine and he will pick it up tomorrow.

## 2015-07-03 NOTE — Telephone Encounter (Signed)
Received Lialda patient assistance paperwork by fax to fill out for patient. Filled out our part and called and left a message for patient to return my call.

## 2015-07-04 NOTE — Telephone Encounter (Signed)
[name] (patient's daughter) states she just got off the phone with [facility] care and patient has not met the gab coverage for this year. She states the paper work that I started can be sent in after he had paid his co pay for the next year. [name] states they are supposed to be sending a refill request so patient can get Lialda through the rest of the year. Told patient I will fax it back once they send me the request.

## 2015-07-11 ENCOUNTER — Ambulatory Visit (INDEPENDENT_AMBULATORY_CARE_PROVIDER_SITE_OTHER): Payer: Medicare Other | Admitting: Gastroenterology

## 2015-07-11 ENCOUNTER — Encounter: Payer: Self-pay | Admitting: Gastroenterology

## 2015-07-11 VITALS — BP 116/62 | HR 64 | Ht 68.0 in | Wt 230.8 lb

## 2015-07-11 DIAGNOSIS — K5100 Ulcerative (chronic) pancolitis without complications: Secondary | ICD-10-CM | POA: Diagnosis not present

## 2015-07-11 DIAGNOSIS — K2270 Barrett's esophagus without dysplasia: Secondary | ICD-10-CM | POA: Diagnosis not present

## 2015-07-11 NOTE — Assessment & Plan Note (Signed)
Continue Lialda 2.4 g daily. Surveillance colonoscopy recommended at a 2 year interval in March 2017.

## 2015-07-11 NOTE — Assessment & Plan Note (Addendum)
Continue pantoprazole 40 mg daily and standard antireflux measures. Surveillance endoscopy recommended at a four-year interval in March 2019 to coincide with colonoscopy recall.

## 2015-07-11 NOTE — Progress Notes (Signed)
    History of Present Illness: This is a 74 year old male with ulcerative colitis returning for follow-up. Surveillance colonoscopy was performed in March 2015 with mild inflammatory changes in the rectum and sigmoid colon with the proximal colon mucosa appearing normal. Biopsies showing mildly active left sided colitis and minimally active right-sided colitis. His reflux symptoms are under very good control on daily pantoprazole. He has occasional episodes of loose stools but in general his ulcerative colitis symptoms have been under very good control on Lialda.  Current Medications, Allergies, Past Medical History, Past Surgical History, Family History and Social History were reviewed in [employer] record.  Physical Exam: General: Well developed, well nourished, no acute distress Head: Normocephalic and atraumatic Eyes:  sclerae anicteric, EOMI Ears: Normal auditory acuity Mouth: No deformity or lesions Lungs: Clear throughout to auscultation Heart: Regular rate and rhythm; no murmurs, rubs or bruits Abdomen: Soft, non tender and non distended. No masses, hepatosplenomegaly or hernias noted. Normal Bowel sounds Musculoskeletal: Symmetrical with no gross deformities  Pulses:  Normal pulses noted Extremities: No clubbing, cyanosis, edema or deformities noted Neurological: Alert oriented x 4, grossly nonfocal Psychological:  Alert and cooperative. Normal mood and affect  Assessment and Recommendations:

## 2015-07-11 NOTE — Patient Instructions (Signed)
You have your daughter contact me if you need refills on protonix or Lialda.   Thank you for choosing me and [facility].  [name]. Dagoberto Ligas., MD., [facility]

## 2015-08-22 ENCOUNTER — Other Ambulatory Visit: Payer: Self-pay | Admitting: Gastroenterology

## 2015-09-27 ENCOUNTER — Other Ambulatory Visit: Payer: Self-pay | Admitting: Internal Medicine

## 2015-09-27 NOTE — Telephone Encounter (Signed)
Done hardcopy to dahlia

## 2015-09-27 NOTE — Telephone Encounter (Signed)
Rx faxed to pharmacy  

## 2015-10-02 ENCOUNTER — Telehealth: Payer: Self-pay | Admitting: Gastroenterology

## 2015-10-02 ENCOUNTER — Telehealth: Payer: Self-pay | Admitting: Cardiovascular Disease

## 2015-10-02 MED ORDER — MESALAMINE 1.2 G PO TBEC: 2.4000 g | DELAYED_RELEASE_TABLET | Freq: Every day | ORAL | Status: DC

## 2015-10-02 MED ORDER — EZETIMIBE 10 MG PO TABS: 10.0000 mg | ORAL_TABLET | Freq: Every day | ORAL | Status: DC

## 2015-10-02 NOTE — Telephone Encounter (Signed)
Informed [name] that I sent her father's prescription of Lialda to [facility]. She verbalized understanding.

## 2015-10-02 NOTE — Telephone Encounter (Signed)
°*  STAT* If patient is at the pharmacy, call can be transferred to refill team.   1. Which medications need to be refilled? (please list name of each medication and dose if known) Zeita  2. Which pharmacy/location (including street and city if local pharmacy) is medication to be sent to?[facility]   3. Do they need a 30 day or 90 day supply? [place]

## 2015-10-02 NOTE — Telephone Encounter (Signed)
Refill sent to the pharmacy electronically.  

## 2015-10-09 ENCOUNTER — Other Ambulatory Visit: Payer: Self-pay | Admitting: *Deleted

## 2015-10-09 MED ORDER — EZETIMIBE 10 MG PO TABS: 10.0000 mg | ORAL_TABLET | Freq: Every day | ORAL | Status: DC

## 2015-10-12 ENCOUNTER — Telehealth: Payer: Self-pay | Admitting: Cardiovascular Disease

## 2015-10-12 MED ORDER — EZETIMIBE 10 MG PO TABS: 10.0000 mg | ORAL_TABLET | Freq: Every day | ORAL | Status: DC

## 2015-10-12 NOTE — Telephone Encounter (Signed)
°*  STAT* If patient is at the pharmacy, call can be transferred to refill team.   1. Which medications need to be refilled? (please list name of each medication and dose if known) Zeita 22m   2. Which pharmacy/location (including street and city if local pharmacy) is medication to be sent to?[facility] [phone]  3. Do they need a 30 day or 90 day supply? [place]

## 2015-10-12 NOTE — Telephone Encounter (Signed)
Refill sent.

## 2015-10-17 ENCOUNTER — Encounter: Payer: Self-pay | Admitting: Gastroenterology

## 2015-11-14 ENCOUNTER — Other Ambulatory Visit: Payer: Self-pay

## 2015-11-14 ENCOUNTER — Telehealth: Payer: Self-pay

## 2015-11-14 MED ORDER — CLOPIDOGREL BISULFATE 75 MG PO TABS: ORAL_TABLET | ORAL | Status: DC

## 2015-11-14 NOTE — Telephone Encounter (Signed)
Medication sent to pharmacy  

## 2015-11-16 ENCOUNTER — Other Ambulatory Visit (INDEPENDENT_AMBULATORY_CARE_PROVIDER_SITE_OTHER): Payer: Medicare Other

## 2015-11-16 DIAGNOSIS — Z0189 Encounter for other specified special examinations: Secondary | ICD-10-CM | POA: Diagnosis not present

## 2015-11-16 DIAGNOSIS — R7302 Impaired glucose tolerance (oral): Secondary | ICD-10-CM | POA: Diagnosis not present

## 2015-11-16 DIAGNOSIS — Z0000 Encounter for general adult medical examination without abnormal findings: Secondary | ICD-10-CM

## 2015-11-16 LAB — CBC WITH DIFFERENTIAL/PLATELET
BASOS ABS: 0 10*3/uL (ref 0.0–0.1)
Basophils Relative: 0.4 % (ref 0.0–3.0)
Eosinophils Absolute: 0.3 10*3/uL (ref 0.0–0.7)
Eosinophils Relative: 4.4 % (ref 0.0–5.0)
HCT: 47.1 % (ref 39.0–52.0)
Hemoglobin: 16 g/dL (ref 13.0–17.0)
LYMPHS ABS: 1.1 10*3/uL (ref 0.7–4.0)
Lymphocytes Relative: 16.3 % (ref 12.0–46.0)
MCHC: 34.1 g/dL (ref 30.0–36.0)
MCV: 86.7 fl (ref 78.0–100.0)
MONO ABS: 0.8 10*3/uL (ref 0.1–1.0)
MONOS PCT: 11.3 % (ref 3.0–12.0)
NEUTROS PCT: 67.6 % (ref 43.0–77.0)
Neutro Abs: 4.7 10*3/uL (ref 1.4–7.7)
Platelets: 219 10*3/uL (ref 150.0–400.0)
RBC: 5.43 Mil/uL (ref 4.22–5.81)
RDW: 14.4 % (ref 11.5–15.5)
WBC: 7 10*3/uL (ref 4.0–10.5)

## 2015-11-16 LAB — BASIC METABOLIC PANEL
BUN: 14 mg/dL (ref 6–23)
CALCIUM: 9.1 mg/dL (ref 8.4–10.5)
CO2: 30 mEq/L (ref 19–32)
Chloride: 99 mEq/L (ref 96–112)
Creatinine, Ser: 1.06 mg/dL (ref 0.40–1.50)
GFR: 72.51 mL/min (ref >60.00)
GLUCOSE: 126 mg/dL — AB (ref 70–99)
Potassium: 4.1 mEq/L (ref 3.5–5.1)
SODIUM: 137 meq/L (ref 135–145)

## 2015-11-16 LAB — HEPATIC FUNCTION PANEL
ALBUMIN: 4.2 g/dL (ref 3.5–5.2)
ALK PHOS: 48 U/L (ref 39–117)
ALT: 21 U/L (ref 0–53)
AST: 17 U/L (ref 0–37)
Bilirubin, Direct: 0.1 mg/dL (ref 0.0–0.3)
TOTAL PROTEIN: 6.7 g/dL (ref 6.0–8.3)
Total Bilirubin: 0.7 mg/dL (ref 0.2–1.2)

## 2015-11-16 LAB — LIPID PANEL
CHOLESTEROL: 148 mg/dL (ref 0–200)
HDL: 30 mg/dL — ABNORMAL LOW (ref >39.00)
LDL CALC: 94 mg/dL (ref 0–99)
NonHDL: 118.05
Total CHOL/HDL Ratio: 5
Triglycerides: 118 mg/dL (ref 0.0–149.0)
VLDL: 23.6 mg/dL (ref 0.0–40.0)

## 2015-11-16 LAB — URINALYSIS, ROUTINE W REFLEX MICROSCOPIC
BILIRUBIN URINE: NEGATIVE
Hgb urine dipstick: NEGATIVE
KETONES UR: NEGATIVE
LEUKOCYTES UA: NEGATIVE
Nitrite: NEGATIVE
PH: 6 (ref 5.0–8.0)
RBC / HPF: NONE SEEN (ref 0–?)
Specific Gravity, Urine: 1.015 (ref 1.000–1.030)
TOTAL PROTEIN, URINE-UPE24: NEGATIVE
UROBILINOGEN UA: 0.2 (ref 0.0–1.0)
Urine Glucose: NEGATIVE
WBC, UA: NONE SEEN (ref 0–?)

## 2015-11-16 LAB — HEMOGLOBIN A1C: Hgb A1c MFr Bld: 5.9 % (ref 4.6–6.5)

## 2015-11-16 LAB — PSA: PSA: 1.36 ng/mL (ref 0.10–4.00)

## 2015-11-16 LAB — TSH: TSH: 1.59 u[IU]/mL (ref 0.35–4.50)

## 2015-11-21 ENCOUNTER — Ambulatory Visit (INDEPENDENT_AMBULATORY_CARE_PROVIDER_SITE_OTHER): Payer: Medicare Other | Admitting: Internal Medicine

## 2015-11-21 ENCOUNTER — Encounter: Payer: Self-pay | Admitting: Internal Medicine

## 2015-11-21 ENCOUNTER — Telehealth: Payer: Self-pay

## 2015-11-21 VITALS — BP 136/74 | HR 77 | Temp 98.7°F | Resp 20 | Wt 239.0 lb

## 2015-11-21 DIAGNOSIS — R7302 Impaired glucose tolerance (oral): Secondary | ICD-10-CM | POA: Diagnosis not present

## 2015-11-21 DIAGNOSIS — E785 Hyperlipidemia, unspecified: Secondary | ICD-10-CM

## 2015-11-21 DIAGNOSIS — I10 Essential (primary) hypertension: Secondary | ICD-10-CM | POA: Diagnosis not present

## 2015-11-21 DIAGNOSIS — G4700 Insomnia, unspecified: Secondary | ICD-10-CM | POA: Diagnosis not present

## 2015-11-21 DIAGNOSIS — Z0000 Encounter for general adult medical examination without abnormal findings: Secondary | ICD-10-CM

## 2015-11-21 MED ORDER — LISINOPRIL-HYDROCHLOROTHIAZIDE 20-25 MG PO TABS: 1.0000 | ORAL_TABLET | Freq: Every day | ORAL | Status: DC

## 2015-11-21 MED ORDER — ZOLPIDEM TARTRATE ER 12.5 MG PO TBCR
12.5000 mg | EXTENDED_RELEASE_TABLET | Freq: Every evening | ORAL | Status: DC | PRN
Start: 2015-11-21 — End: 2016-05-09

## 2015-11-21 MED ORDER — CLOPIDOGREL BISULFATE 75 MG PO TABS
ORAL_TABLET | ORAL | Status: DC
Start: 2015-11-21 — End: 2016-11-06

## 2015-11-21 NOTE — Progress Notes (Signed)
Pre visit review using our clinic review tool, if applicable. No additional management support is needed unless otherwise documented below in the visit note. 

## 2015-11-21 NOTE — Progress Notes (Signed)
Subjective:    Patient ID: [name] [name], male    DOB: [birth year], 75 y.o.   MRN: [id number]  HPI  Here for wellness and f/u;  Overall doing ok;  Pt denies Chest pain, worsening SOB, DOE, wheezing, orthopnea, PND, worsening LE edema, palpitations, dizziness or syncope.  Pt denies neurological change such as new headache, facial or extremity weakness.  Pt denies polydipsia, polyuria, or low sugar symptoms. Pt states overall good compliance with treatment and medications, good tolerability, and has been trying to follow appropriate diet.  Pt denies worsening depressive symptoms, suicidal ideation or panic. No fever, night sweats, wt loss, loss of appetite, or other constitutional symptoms.  Pt states good ability with ADL's, has low fall risk, home safety reviewed and adequate, no other significant changes in hearing or vision, and only occasionally active with exercise.  Ambien 10 mg no longer effective, took 20 mg once night and slept well.  Has not tried other,  Past Medical History  Diagnosis Date  . Abdominal pain, generalized 08/15/2008  . Abdominal pain, unspecified site 10/16/2009  . Anal fissure 04/24/2008  . BACK PAIN 01/10/2009  . BARRETTS ESOPHAGUS 08/16/2007  . BREAST HYPERTROPHY 02/16/2009  . CARBUNCLE, NECK 02/14/2008  . CHEST PAIN 10/10/2008  . DEPRESSION 10/16/2009  . DIVERTICULOSIS, COLON 06/28/2007  . GERD 06/28/2007  . GLUCOSE INTOLERANCE 08/15/2008  . Headache(784.0) 11/13/2009  . HERNIA, UMBILICAL W/OBSTRUCTION W/O GANGRENE 06/28/2007  . HIP PAIN, RIGHT 10/16/2009  . HYPERLIPIDEMIA 06/28/2007  . HYPERTENSION 06/28/2007  . INSOMNIA-SLEEP DISORDER-UNSPEC 10/10/2008  . LEG PAIN, BILATERAL 02/16/2009  . LOW BACK PAIN 06/28/2007  . MASTITIS 01/10/2009  . NECK PAIN 05/31/2008  . NEPHROLITHIASIS, HX OF 06/28/2007  . PROCTOSIGMOIDITIS, ULCERATIVE 1997  . TESTICULAR PAIN, RIGHT 10/25/2010  . VITAMIN D DEFICIENCY 11/13/2009  . Impaired glucose tolerance 04/13/2011  . Barrett's esophagus 1997  .  Arthritis   . Cataract   . PVD (peripheral vascular disease) with claudication, lifestyle limiting, ABI rt. 0.67, lt. 0.73 07/12/2012  . S/P angioplasty with stent, after DB athrectomy to Rt. ext. iliac and Rt. SFA 07/12/2012  . HTN (hypertension) 07/12/2012  . Skin cancer     right hand  . Stenosis of left carotid artery, mod to severe by dopplers 09/29/2012  . Full dentures   . B12 deficiency 10/11/2014   Past Surgical History  Procedure Laterality Date  . Left knee arthroscopy    . Bilateral inguinal hernia with mesh and umbilical hernia repairs  09/2007  . Anal fissure surgury    . Cataract extraction      bilateral  . Colonoscopy    . Atherectomy  09/28/2012  . Hernia repair    . Groin dissection Right 07/20/2013    Procedure: GROIN EXPLORATION, EXCISIONAL BIOPSY RIGHT INGUINAL LYMPH NODES;  Surgeon: Harl Bowie, MD;  Location: [facility];  Service: General;  Laterality: Right;  . Atherectomy N/A 07/12/2012    Procedure: ATHERECTOMY;  Surgeon: [name], MD;  Location: [facility] CATH LAB;  Service: Cardiovascular;  Laterality: N/A;  . Atherectomy N/A 09/28/2012    Procedure: ATHERECTOMY;  Surgeon: [name], MD;  Location: [facility] CATH LAB;  Service: Cardiovascular;  Laterality: N/A;  . Percutaneous stent intervention  09/28/2012    Procedure: PERCUTANEOUS STENT INTERVENTION;  Surgeon: [name], MD;  Location: [facility] CATH LAB;  Service: Cardiovascular;;    reports that he quit smoking about 43 years ago. His smokeless tobacco use includes Chew. He reports  that he drinks about 3.0 oz of alcohol per week. He reports that he does not use illicit drugs. family history includes COPD in his father and mother; Cerebral aneurysm in his mother. There is no history of Colon cancer, Esophageal cancer, Rectal cancer, or Stomach cancer. Allergies  Allergen Reactions  . Contrast Media [Iodinated Diagnostic Agents]   . Lipitor [Atorvastatin] Other (See Comments)     myalgias  . Lovastatin     Muscle pain  . Statins     Legs pain   Current Outpatient Prescriptions on File Prior to Visit  Medication Sig Dispense Refill  . aspirin EC 81 MG EC tablet Take 1 tablet (81 mg total) by mouth daily. 30 tablet   . ezetimibe (ZETIA) 10 MG tablet Take 1 tablet (10 mg total) by mouth daily. 90 tablet 2  . mesalamine (LIALDA) 1.2 g EC tablet Take 2 tablets (2.4 g total) by mouth daily with breakfast. 180 tablet 1  . pantoprazole (PROTONIX) 40 MG tablet Take 1 tablet by mouth  daily 90 tablet 3   No current facility-administered medications on file prior to visit.   Review of Systems Constitutional: Negative for increased diaphoresis, other activity, appetite or siginficant weight change other than noted HENT: Negative for worsening hearing loss, ear pain, facial swelling, mouth sores and neck stiffness.   Eyes: Negative for other worsening pain, redness or visual disturbance.  Respiratory: Negative for shortness of breath and wheezing  Cardiovascular: Negative for chest pain and palpitations.  Gastrointestinal: Negative for diarrhea, blood in stool, abdominal distention or other pain Genitourinary: Negative for hematuria, flank pain or change in urine volume.  Musculoskeletal: Negative for myalgias or other joint complaints.  Skin: Negative for color change and wound or drainage.  Neurological: Negative for syncope and numbness. other than noted Hematological: Negative for adenopathy. or other swelling Psychiatric/Behavioral: Negative for hallucinations, SI, self-injury, decreased concentration or other worsening agitation.      Objective:   Physical Exam BP 136/74 mmHg  Pulse 77  Temp(Src) 98.7 F (37.1 C) (Oral)  Resp 20  Wt 239 lb (108.41 kg)  SpO2 93% VS noted,  Constitutional: Pt is oriented to person, place, and time. Appears well-developed and well-nourished, in no significant distress Head: Normocephalic and atraumatic.  Right Ear: External  ear normal.  Left Ear: External ear normal.  Nose: Nose normal.  Mouth/Throat: Oropharynx is clear and moist.  Eyes: Conjunctivae and EOM are normal. Pupils are equal, round, and reactive to light.  Neck: Normal range of motion. Neck supple. No JVD present. No tracheal deviation present or significant neck LA or mass Cardiovascular: Normal rate, regular rhythm, normal heart sounds and intact distal pulses.   Pulmonary/Chest: Effort normal and breath sounds without rales or wheezing  Abdominal: Soft. Bowel sounds are normal. NT. No HSM  Musculoskeletal: Normal range of motion. Exhibits no edema.  Lymphadenopathy:  Has no cervical adenopathy.  Neurological: Pt is alert and oriented to person, place, and time. Pt has normal reflexes. No cranial nerve deficit. Motor grossly intact Skin: Skin is warm and dry. No rash noted.  Psychiatric:  Has normal mood and affect. Behavior is normal.     Assessment & Plan:

## 2015-11-21 NOTE — Patient Instructions (Signed)
OK to stop the ambien 10 mg  Please take all new medication as prescribed  - the ambien 12.5 mg  Please continue all other medications as before, and refills have been done if requested.  Please have the pharmacy call with any other refills you may need.  Please continue your efforts at being more active, low cholesterol diet, and weight control.  You are otherwise up to date with prevention measures today.  Please keep your appointments with your specialists as you may have planned  Please return in 6 months, or sooner if needed, with Lab testing done 3-5 days before

## 2015-11-21 NOTE — Telephone Encounter (Signed)
Medication ordered

## 2015-11-22 ENCOUNTER — Telehealth: Payer: Self-pay

## 2015-11-22 NOTE — Telephone Encounter (Signed)
Zolpidem increased from 10 mg to 12.5 mg PA started. KEYLillard Anes

## 2015-11-23 NOTE — Telephone Encounter (Signed)
Clinical data submitted to cover my meds.

## 2015-11-24 NOTE — Assessment & Plan Note (Signed)
stable overall by history and exam, recent data reviewed with pt, and pt to continue medical treatment as before,  to f/u any worsening symptoms or concerns Lab Results  Component Value Date   HGBA1C 5.9 11/16/2015

## 2015-11-24 NOTE — Assessment & Plan Note (Signed)
stable overall by history and exam, recent data reviewed with pt, and pt to continue medical treatment as before,  to f/u any worsening symptoms or concerns Lab Results  Component Value Date   LDLCALC 94 11/16/2015

## 2015-11-24 NOTE — Assessment & Plan Note (Signed)

## 2015-11-24 NOTE — Assessment & Plan Note (Signed)
Mild uncontrolled, for change ambien to 12.5 gm qhs prn,  to f/u any worsening symptoms or concerns

## 2015-11-24 NOTE — Assessment & Plan Note (Signed)
stable overall by history and exam, recent data reviewed with pt, and pt to continue medical treatment as before,  to f/u any worsening symptoms or concerns BP Readings from Last 3 Encounters:  11/21/15 136/74  07/11/15 116/62  05/29/15 120/62

## 2015-11-26 NOTE — Telephone Encounter (Signed)
Received fax that PA is approved until 09/07/2016.   Patient dtr informed of PA approval.   Faxed approval to [facility].

## 2015-12-10 ENCOUNTER — Encounter: Payer: Self-pay | Admitting: Gastroenterology

## 2015-12-10 ENCOUNTER — Ambulatory Visit (INDEPENDENT_AMBULATORY_CARE_PROVIDER_SITE_OTHER): Payer: Medicare Other | Admitting: Gastroenterology

## 2015-12-10 ENCOUNTER — Telehealth: Payer: Self-pay

## 2015-12-10 VITALS — BP 116/64 | HR 80 | Ht 68.0 in | Wt 231.8 lb

## 2015-12-10 DIAGNOSIS — K5100 Ulcerative (chronic) pancolitis without complications: Secondary | ICD-10-CM | POA: Diagnosis not present

## 2015-12-10 DIAGNOSIS — Z7901 Long term (current) use of anticoagulants: Secondary | ICD-10-CM | POA: Diagnosis not present

## 2015-12-10 DIAGNOSIS — K2270 Barrett's esophagus without dysplasia: Secondary | ICD-10-CM

## 2015-12-10 MED ORDER — NA SULFATE-K SULFATE-MG SULF 17.5-3.13-1.6 GM/177ML PO SOLN: 1.0000 | Freq: Once | ORAL | Status: DC

## 2015-12-10 NOTE — Assessment & Plan Note (Signed)
Continue Lialda. He is due for surveillance colonoscopy. Schedule colonoscopy. The risks (including bleeding, perforation, infection, missed lesions, medication reactions and possible hospitalization or surgery if complications occur), benefits, and alternatives to colonoscopy with possible biopsy and possible polypectomy were discussed with the patient and they consent to proceed. Hold Plavix 5 days before procedure - will instruct when and how to resume after procedure. Low but real risk of cardiovascular event such as heart attack, stroke, embolism, thrombosis or ischemia/infarct of other organs off Palvix explained and need to seek urgent help if this occurs. The patient consents to proceed. Will communicate by phone or EMR with patient's prescribing provider to confirm that holding Plavix is reasonable in this case.

## 2015-12-10 NOTE — Assessment & Plan Note (Signed)
Continue pantoprazole 40 mg daily and standard antireflux measures. Surveillance EGD due in 11/2017.

## 2015-12-10 NOTE — Patient Instructions (Signed)
You have been scheduled for a colonoscopy. Please follow written instructions given to you at your visit today.  Please pick up your prep supplies at the pharmacy within the next 1-3 days. If you use inhalers (even only as needed), please bring them with you on the day of your procedure.  Thank you for choosing me and [facility].  [name]. Dagoberto Ligas., MD., Marval Regal

## 2015-12-10 NOTE — Telephone Encounter (Signed)
Okay to interrupt antiplatelet therapy 7 days prior to the procedure.

## 2015-12-10 NOTE — Progress Notes (Signed)
    History of Present Illness: This is a 75 year old male returning for follow-up of ulcerative colitis and Barrett's esophagus. His reflux symptoms are under excellent control. He states he has occasional urgency and loose diarrhea without rectal bleeding. No other complaints.  Current Medications, Allergies, Past Medical History, Past Surgical History, Family History and Social History were reviewed in [employer] record.  Physical Exam: General: Well developed, well nourished, no acute distress Head: Normocephalic and atraumatic Eyes:  sclerae anicteric, EOMI Ears: Normal auditory acuity Mouth: No deformity or lesions Lungs: Clear throughout to auscultation Heart: Regular rate and rhythm; no murmurs, rubs or bruits Abdomen: Soft, non tender and non distended. No masses, hepatosplenomegaly or hernias noted. Normal Bowel sounds Rectal: deferred to colonoscopy Musculoskeletal: Symmetrical with no gross deformities  Pulses:  Normal pulses noted Extremities: No clubbing, cyanosis, edema or deformities noted Neurological: Alert oriented x 4, grossly nonfocal Psychological:  Alert and cooperative. Normal mood and affect  Assessment and Recommendations:

## 2015-12-10 NOTE — Telephone Encounter (Signed)
  12/10/2015   RE: [name] [name] DOB: [birth year] MRN: [id number]   Dear Dr. Gwenlyn Found,    We have scheduled the above patient for an endoscopic procedure. Our records show that he is on anticoagulation therapy.   Please advise as to how long the patient may come off his therapy of Plavix prior to the procedure, which is scheduled for 01/24/16.  Please route your answer to Marlon Pel, [place].  Sincerely,    Marlon Pel, CMA

## 2015-12-11 NOTE — Telephone Encounter (Signed)
Patient informed he can hold Plavix 5 days before his procedure.

## 2015-12-25 ENCOUNTER — Telehealth: Payer: Self-pay | Admitting: Cardiovascular Disease

## 2015-12-25 NOTE — Telephone Encounter (Signed)
New Message  Pt states he dropped off a form for medications around 10 am this morning 12/25/2015. This is for his medication Zetia for Cholesterol.Pt request a call back to determine if he can come to pick it up. Please call

## 2015-12-26 NOTE — Telephone Encounter (Signed)
Pt here to pick up letter. Letter is signed and completed. Letter given to pt. Copy of letter placed to be scanned into our system.

## 2016-01-09 ENCOUNTER — Telehealth: Payer: Self-pay | Admitting: Cardiovascular Disease

## 2016-01-09 NOTE — Telephone Encounter (Signed)
Spoke with pt dtr, the paperwork was returned to [facility] regarding the zetia. She has contacted [facility] and they report they have sent the paperwork back to dr berry because he did not sign in the correct areas. The paperwork was mailed back to [place] on 01/04/16. She wanted to know if we have received and the statis of that paperwork. Will forward to [facility]

## 2016-01-09 NOTE — Telephone Encounter (Signed)
New Message  Pt requested to speak w. RN- did not specify nature of phone call. Please call back and discuss.

## 2016-01-10 NOTE — Telephone Encounter (Signed)
Pt's daughter call to follow up on pt's medications please give her a call back.

## 2016-01-11 ENCOUNTER — Encounter: Payer: Self-pay | Admitting: Gastroenterology

## 2016-01-11 NOTE — Telephone Encounter (Signed)
Attempted to call pt's daughter, ok per DPR. No answer.  Call the pt who answered. He did not know exactly what was going on but that the doctor needs to sign a portion of the form from [employer] for his zetia. Pt said his duaghter was very unhappy that she was not able to talk to anyone when she was on her break at work. The pt has about 2 weeks of his medication left.  Called [employer] to see when they mailed the letter back. They mailed it on 01/04/16. No sign of the letter for Dr Gwenlyn Found at this time. [employer] could not fax the letter because the letter is in the mail and they do not do faxes.  Will continue to look out for letter.  Will forward to [employer].

## 2016-01-11 NOTE — Telephone Encounter (Signed)
Follow Up  Pt is requesting call back states this is her 3rd attempt. Please call

## 2016-01-11 NOTE — Telephone Encounter (Signed)
Received call back from pt's daughter, ok per DPR. Pt understood that the process may take a few more days until the letter is received. She said she would check back next week to see if we had received it yet.

## 2016-01-16 NOTE — Telephone Encounter (Signed)
[place] to let them know we had not received the letter yet.  We have the scanned copy of the form, they approved to accept the copy with the physician's signature on it. This was approved by the supervisor at [employer]. Pelia is the representative from [employer], said the pt also needed to fill out another form about exception that they had mailed back to [place]. She will put another copy into the mail to the patient to fill out. Then the pt needs to bring that form to the office and mail it together with the original or copy of the [employer] patient assistance program enrollment form. Application # [id number] needs to be on the paperwork and mailed to Attn: [employer], [address], [place] [address].  Called Pt's daughter, Arrie Aran, ok per DPR, back and let her know this information. She is concerned that this is taking so long. I apologized and explained that the mail sometimes takes a while to get to the proper places. She verbalized understanding and said she will get the form filled out and brought to [place] as soon as her father receives it.  Will route to [employer] and [employer].

## 2016-01-16 NOTE — Telephone Encounter (Signed)
Follow Up  Pt daughter is returning call

## 2016-01-24 ENCOUNTER — Encounter: Payer: Self-pay | Admitting: Gastroenterology

## 2016-01-24 ENCOUNTER — Ambulatory Visit (AMBULATORY_SURGERY_CENTER): Payer: Medicare Other | Admitting: Gastroenterology

## 2016-01-24 VITALS — BP 124/61 | HR 69 | Temp 97.8°F | Resp 14 | Ht 68.0 in | Wt 231.0 lb

## 2016-01-24 DIAGNOSIS — K5190 Ulcerative colitis, unspecified, without complications: Secondary | ICD-10-CM | POA: Diagnosis not present

## 2016-01-24 DIAGNOSIS — K5100 Ulcerative (chronic) pancolitis without complications: Secondary | ICD-10-CM | POA: Diagnosis not present

## 2016-01-24 DIAGNOSIS — K529 Noninfective gastroenteritis and colitis, unspecified: Secondary | ICD-10-CM | POA: Diagnosis not present

## 2016-01-24 DIAGNOSIS — Z1211 Encounter for screening for malignant neoplasm of colon: Secondary | ICD-10-CM

## 2016-01-24 MED ORDER — SODIUM CHLORIDE 0.9 % IV SOLN: 500.0000 mL | INTRAVENOUS | Status: DC

## 2016-01-24 NOTE — Op Note (Signed)
[facility] Patient Name: [name] [name] Procedure Date: 01/24/2016 11:24 AM MRN: [id number] Endoscopist: Ladene Artist , MD Age: 75 Referring MD:  Date of Birth: 10-Jul-1941 Gender: Male Procedure:                Colonoscopy Indications:              High risk colon cancer surveillance: Ulcerative                            pancolitis of 8 (or more) years duration Medicines:                Monitored Anesthesia Care Procedure:                Pre-Anesthesia Assessment:                           - Prior to the procedure, a History and Physical                            was performed, and patient medications and                            allergies were reviewed. The patient's tolerance of                            previous anesthesia was also reviewed. The risks                            and benefits of the procedure and the sedation                            options and risks were discussed with the patient.                            All questions were answered, and informed consent                            was obtained. Prior Anticoagulants: The patient has                            taken Plavix (clopidogrel), last dose was 5 days                            prior to procedure. ASA Grade Assessment: II - A                            patient with mild systemic disease. After reviewing                            the risks and benefits, the patient was deemed in                            satisfactory condition to undergo the procedure.  After obtaining informed consent, the colonoscope                            was passed under direct vision. Throughout the                            procedure, the patient's blood pressure, pulse, and                            oxygen saturations were monitored continuously. The                            Model PCF-H190DL [id number]) scope was introduced                            through the anus and advanced  to the the cecum,                            identified by appendiceal orifice and ileocecal                            valve. The colonoscopy was performed without                            difficulty. The patient tolerated the procedure                            well. The quality of the bowel preparation was                            good. The ileocecal valve, appendiceal orifice, and                            rectum were photographed. Scope In: 11:29:33 AM Scope Out: 11:43:43 AM Scope Withdrawal Time: 0 hours 12 minutes 16 seconds  Total Procedure Duration: 0 hours 14 minutes 10 seconds  Findings:                 The digital rectal exam was normal.                           Localized mild inflammation characterized by                            erythema, granularity and loss of vascularity was                            found in the sigmoid colon in area of                            diverticulosis. Biopsies were taken with a cold                            forceps for histology. The more distal distal  sigmoid colon appeared normal.                           Localized mild inflammation characterized by                            erythema and granularity was found in the rectum.                            Biopsies were taken with a cold forceps for                            histology.                           Multiple medium-mouthed diverticula were found in                            the sigmoid colon. There was narrowing of the colon                            in association with the diverticular opening. There                            was evidence of diverticular spasm. Erythema was                            seen in association with the diverticular opening.                            There was no evidence of diverticular bleeding.                            Biopsies were taken with a cold forceps for                            histology.                            The exam was otherwise normal throughout the                            examined colon.                           The retroflexed view of the distal rectum and anal                            verge otherwise was normal and showed no anal or                            rectal abnormalities. Complications:            No immediate complications. Estimated Blood Loss:     Estimated blood loss was minimal. Impression:               -  Localized mild inflammation was found in the                            sigmoid colon secondary to left-sided colitis.                            Biopsied.                           - Localized mild inflammation was found in the                            rectum secondary to proctitis. Biopsied.                           - Moderate diverticulosis in the sigmoid colon.                            There was narrowing of the colon in association                            with the diverticular opening. There was evidence                            of diverticular spasm. Erythema was seen in                            association with the diverticular opening. There                            was no evidence of diverticular bleeding. Biopsied. Recommendation:           - Patient has a contact number available for                            emergencies. The signs and symptoms of potential                            delayed complications were discussed with the                            patient. Return to normal activities tomorrow.                            Written discharge instructions were provided to the                            patient.                           - Resume previous diet.                           - Continue present medications.                           -  Resume Plavix (clopidogrel) at prior dose                            tomorrow. Refer to managing physician for further                            adjustment of therapy.                            - Await pathology results.                           - Repeat colonoscopy in 2-3 years for surveillance                            pending pathology review. Ladene Artist, MD 01/24/2016 11:52:24 AM This report has been signed electronically.

## 2016-01-24 NOTE — Progress Notes (Signed)
A/ox3 pleased with MAC, report to [employer]

## 2016-01-24 NOTE — Patient Instructions (Signed)
YOU HAD AN ENDOSCOPIC PROCEDURE TODAY AT [facility]:   Refer to the procedure report that was given to you for any specific questions about what was found during the examination.  If the procedure report does not answer your questions, please call your gastroenterologist to clarify.  If you requested that your care partner not be given the details of your procedure findings, then the procedure report has been included in a sealed envelope for you to review at your convenience later.  YOU SHOULD EXPECT: Some feelings of bloating in the abdomen. Passage of more gas than usual.  Walking can help get rid of the air that was put into your GI tract during the procedure and reduce the bloating. If you had a lower endoscopy (such as a colonoscopy or flexible sigmoidoscopy) you may notice spotting of blood in your stool or on the toilet paper. If you underwent a bowel prep for your procedure, you may not have a normal bowel movement for a few days.  Please Note:  You might notice some irritation and congestion in your nose or some drainage.  This is from the oxygen used during your procedure.  There is no need for concern and it should clear up in a day or so.  SYMPTOMS TO REPORT IMMEDIATELY:   Following lower endoscopy (colonoscopy or flexible sigmoidoscopy):  Excessive amounts of blood in the stool  Significant tenderness or worsening of abdominal pains  Swelling of the abdomen that is new, acute  Fever of 100F or higher  For urgent or emergent issues, a gastroenterologist can be reached at any hour by calling [phone].   DIET: Your first meal following the procedure should be a small meal and then it is ok to progress to your normal diet. Heavy or fried foods are harder to digest and may make you feel nauseous or bloated.  Likewise, meals heavy in dairy and vegetables can increase bloating.  Drink plenty of fluids but you should avoid alcoholic beverages for 24  hours.  ACTIVITY:  You should plan to take it easy for the rest of today and you should NOT DRIVE or use heavy machinery until tomorrow (because of the sedation medicines used during the test).    FOLLOW UP: Our staff will call the number listed on your records the next business day following your procedure to check on you and address any questions or concerns that you may have regarding the information given to you following your procedure. If we do not reach you, we will leave a message.  However, if you are feeling well and you are not experiencing any problems, there is no need to return our call.  We will assume that you have returned to your regular daily activities without incident.  If any biopsies were taken you will be contacted by phone or by letter within the next 1-3 weeks.  Please call us at [phone] if you have not heard about the biopsies in 3 weeks.    SIGNATURES/CONFIDENTIALITY: You and/or your care partner have signed paperwork which will be entered into your electronic medical record.  These signatures attest to the fact that that the information above on your After Visit Summary has been reviewed and is understood.  Full responsibility of the confidentiality of this discharge information lies with you and/or your care-partner.  Resume Plavix tomorrow.  Await pathology results. Next colonoscopy in 2-3 years.

## 2016-01-24 NOTE — Progress Notes (Signed)
Called to room to assist during endoscopic procedure.  Patient ID and intended procedure confirmed with present staff. Received instructions for my participation in the procedure from the performing physician.  

## 2016-01-25 ENCOUNTER — Telehealth: Payer: Self-pay | Admitting: *Deleted

## 2016-01-25 NOTE — Telephone Encounter (Signed)
  Follow up Call-  Call back number 01/24/2016 12/02/2013  Post procedure Call Back phone  # [phone]  Permission to leave phone message Yes Yes     Patient questions:  Do you have a fever, pain , or abdominal swelling? No. Pain Score  0 *  Have you tolerated food without any problems? Yes.    Have you been able to return to your normal activities? Yes.    Do you have any questions about your discharge instructions: Diet   No. Medications  No. Follow up visit  No.  Do you have questions or concerns about your Care? No.  Actions: * If pain score is 4 or above: No action needed, pain <4.

## 2016-01-29 ENCOUNTER — Encounter: Payer: Self-pay | Admitting: Gastroenterology

## 2016-04-10 ENCOUNTER — Encounter: Payer: Self-pay | Admitting: Family

## 2016-04-10 ENCOUNTER — Other Ambulatory Visit (INDEPENDENT_AMBULATORY_CARE_PROVIDER_SITE_OTHER): Payer: Medicare Other

## 2016-04-10 ENCOUNTER — Ambulatory Visit (INDEPENDENT_AMBULATORY_CARE_PROVIDER_SITE_OTHER): Payer: Medicare Other | Admitting: Family

## 2016-04-10 DIAGNOSIS — N644 Mastodynia: Secondary | ICD-10-CM

## 2016-04-10 LAB — LUTEINIZING HORMONE: LH: 6.43 m[IU]/mL (ref 3.10–34.60)

## 2016-04-10 LAB — TESTOSTERONE: TESTOSTERONE: 382.01 ng/dL (ref 300.00–890.00)

## 2016-04-10 MED ORDER — NAPROXEN 500 MG PO TABS: 500.0000 mg | ORAL_TABLET | Freq: Two times a day (BID) | ORAL | 0 refills | Status: DC

## 2016-04-10 NOTE — Patient Instructions (Addendum)
Thank you for choosing [facility].  Summary/Instructions:  Start take Naproxen 500 mg twice daily as needed.  We will follow up pending blood work for additional treatment.   Your prescription(s) have been submitted to your pharmacy or been printed and provided for you. Please take as directed and contact our office if you believe you are having problem(s) with the medication(s) or have any questions.  Please stop by the lab on the lower level of the building for your blood work. Your results will be released to [place] (or called to you) after review, usually within 72 hours after test completion. If any changes need to be made, you will be notified at that same time.  1. The lab is open from 7:30am to 5:30 pm Monday-Friday  2. No appointment is necessary  3. Fasting (if needed) is 6-8 hours after food and drink; black coffee and water  are okay   If your symptoms worsen or fail to improve, please contact our office for further instruction, or in case of emergency go directly to the emergency room at the closest medical facility.

## 2016-04-10 NOTE — Assessment & Plan Note (Signed)
Physical exam consistent with gynecomastia most likely the cause of his mastalgia. Previous mammogram completed with results unavailable at this time. Obtain testosterone, LH, hCG, and estradiol. Start naproxen sodium as needed for discomfort. Follow-up pending blood work for additional treatment if indicated.

## 2016-04-10 NOTE — Progress Notes (Signed)
Subjective:    Patient ID: [name] [name], male    DOB: May 14, 1941, 75 y.o.   MRN: [id number]  Chief Complaint  Patient presents with  . chest discomfort    feels like chest is "burining"    HPI:  YESHAYA VATH is a 75 y.o. male who  has a past medical history of Abdominal pain, generalized (08/15/2008); Abdominal pain, unspecified site (10/16/2009); Anal fissure (04/24/2008); Arthritis; B12 deficiency (10/11/2014); BACK PAIN (01/10/2009); Barrett's esophagus (1997); BARRETTS ESOPHAGUS (08/16/2007); BREAST HYPERTROPHY (02/16/2009); CARBUNCLE, NECK (02/14/2008); Cataract; CHEST PAIN (10/10/2008); DEPRESSION (10/16/2009); DIVERTICULOSIS, COLON (06/28/2007); Full dentures; GERD (06/28/2007); GLUCOSE INTOLERANCE (08/15/2008); ELTRVUYE(334.3) (11/13/2009); HERNIA, UMBILICAL W/OBSTRUCTION W/O GANGRENE (06/28/2007); HIP PAIN, RIGHT (10/16/2009); HTN (hypertension) (07/12/2012); HYPERLIPIDEMIA (06/28/2007); HYPERTENSION (06/28/2007); Impaired glucose tolerance (04/13/2011); INSOMNIA-SLEEP DISORDER-UNSPEC (10/10/2008); LEG PAIN, BILATERAL (02/16/2009); LOW BACK PAIN (06/28/2007); MASTITIS (01/10/2009); NECK PAIN (05/31/2008); NEPHROLITHIASIS, HX OF (06/28/2007); PROCTOSIGMOIDITIS, ULCERATIVE (1997); PVD (peripheral vascular disease) with claudication, lifestyle limiting, ABI rt. 0.67, lt. 0.73 (07/12/2012); S/P angioplasty with stent, after DB athrectomy to Rt. ext. iliac and Rt. SFA (07/12/2012); Skin cancer; Stenosis of left carotid artery, mod to severe by dopplers (09/29/2012); TESTICULAR PAIN, RIGHT (10/25/2010); and VITAMIN D DEFICIENCY (11/13/2009). and presents today for a follow up office visit.   Associated symptom symptom of pain located in his chest and described as burning has been going on for about 1 year. Describes the burning sensation occurs when he touches it or his arm lies across his chest. Denies any modifying factors or attempted treatments. Course of the symptoms appears to be worsening. Not effected by food intake. No  chest trauma. No chest pain, shortness of breath or heart palpitations. Describes previous mammogram.    Allergies  Allergen Reactions  . Contrast Media [Iodinated Diagnostic Agents]     Break out in welts  . Lipitor [Atorvastatin] Other (See Comments)    myalgias  . Lovastatin     Muscle pain  . Statins     Legs pain     Current Outpatient Prescriptions on File Prior to Visit  Medication Sig Dispense Refill  . aspirin EC 81 MG EC tablet Take 1 tablet (81 mg total) by mouth daily. 30 tablet   . clopidogrel (PLAVIX) 75 MG tablet Take 1 tablet by mouth  daily 90 tablet 2  . ezetimibe (ZETIA) 10 MG tablet Take 1 tablet (10 mg total) by mouth daily. 90 tablet 2  . lisinopril-hydrochlorothiazide (PRINZIDE,ZESTORETIC) 20-25 MG tablet Take 1 tablet by mouth daily. 90 tablet 2  . mesalamine (LIALDA) 1.2 g EC tablet Take 2 tablets (2.4 g total) by mouth daily with breakfast. 180 tablet 1  . pantoprazole (PROTONIX) 40 MG tablet Take 1 tablet by mouth  daily 90 tablet 3  . zolpidem (AMBIEN CR) 12.5 MG CR tablet Take 1 tablet (12.5 mg total) by mouth at bedtime as needed for sleep. 30 tablet 5   No current facility-administered medications on file prior to visit.      Review of Systems  Constitutional: Negative for fever.  Respiratory: Negative for chest tightness, shortness of breath and wheezing.   Cardiovascular: Positive for chest pain. Negative for palpitations and leg swelling.  Neurological: Negative for weakness and numbness.      Objective:    BP 122/66 (BP Location: Left Arm, Patient Position: Sitting, Cuff Size: Normal)   Pulse 73   Temp 97.7 F (36.5 C) (Oral)   Ht 5' 8"  (1.727 m)   Wt 232 lb 4 oz (105.3 kg)  SpO2 98%   BMI 35.31 kg/m  Nursing note and vital signs reviewed.  Physical Exam  Constitutional: He is oriented to person, place, and time. He appears well-developed and well-nourished. No distress.  Cardiovascular: Normal rate, regular rhythm, normal heart  sounds and intact distal pulses.   Pulmonary/Chest: Effort normal and breath sounds normal.  Chest appears with gynecomastia and enlarged breasts. Mild tenderness elicited over right breast. No masses or lesions noted. No nipple discharge.  Neurological: He is alert and oriented to person, place, and time.  Skin: Skin is warm and dry.  Psychiatric: He has a normal mood and affect. His behavior is normal. Judgment and thought content normal.       Assessment & Plan:   Problem List Items Addressed This Visit      Other   Mastalgia    Physical exam consistent with gynecomastia most likely the cause of his mastalgia. Previous mammogram completed with results unavailable at this time. Obtain testosterone, LH, hCG, and estradiol. Start naproxen sodium as needed for discomfort. Follow-up pending blood work for additional treatment if indicated.      Relevant Orders   hCG, serum, qualitative   Estradiol   LH   Testosterone    Other Visit Diagnoses   None.      I am having Mr. Osley maintain his aspirin, pantoprazole, mesalamine, ezetimibe, lisinopril-hydrochlorothiazide, clopidogrel, and zolpidem.   Follow-up: Pending blood work.  Mauricio Po, FNP

## 2016-04-10 NOTE — Progress Notes (Signed)
Pre visit review using our clinic review tool, if applicable. No additional management support is needed unless otherwise documented below in the visit note. 

## 2016-04-11 LAB — ESTRADIOL: ESTRADIOL: 49 pg/mL — AB (ref <=39)

## 2016-04-11 LAB — HCG, SERUM, QUALITATIVE: PREG SERUM: NEGATIVE

## 2016-04-14 ENCOUNTER — Other Ambulatory Visit: Payer: Self-pay | Admitting: Family

## 2016-04-14 DIAGNOSIS — N62 Hypertrophy of breast: Secondary | ICD-10-CM

## 2016-04-17 ENCOUNTER — Telehealth: Payer: Self-pay

## 2016-04-17 NOTE — Telephone Encounter (Signed)
Patient walked in because he had been playing telephone tag with our office trying to reach him about lab  Work---I have explained lab,  endocrinology referral and reason for that referral, that office will call patient to schedule appt----I have printed copy of labs for patient to carry with him

## 2016-04-23 ENCOUNTER — Telehealth: Payer: Self-pay | Admitting: Gastroenterology

## 2016-04-23 NOTE — Telephone Encounter (Signed)
[name] (patient's daughter) states her father's [name] [name] is over 600 dollars now and he cannot afford it. [name] states she would like for him to apply again for [facility] patient assistance. Informed [name] to go ahead print the online form and fill out there part. Then she can bring it to the office for our part to filled out. Asked [name] if her father had enough medication and she states he has around two more weeks left. Informed her that I can give him a few boxes of Lialda samples until we can find out if he is approved for the patient assistance. I did let [name] know that this could take them a few weeks to process the forms so if she can bring the forms sooner it would be better. [name] states she will come by this Friday with the forms.

## 2016-04-28 NOTE — Telephone Encounter (Signed)
Received [facility] patient assistance from patient. Mailed paper work to [facility] and awaiting response. Also gave patient 2 weeks of Lialda samples until we here from patient assistance.

## 2016-05-01 DIAGNOSIS — Z85828 Personal history of other malignant neoplasm of skin: Secondary | ICD-10-CM | POA: Diagnosis not present

## 2016-05-01 DIAGNOSIS — L820 Inflamed seborrheic keratosis: Secondary | ICD-10-CM | POA: Diagnosis not present

## 2016-05-01 DIAGNOSIS — L821 Other seborrheic keratosis: Secondary | ICD-10-CM | POA: Diagnosis not present

## 2016-05-09 ENCOUNTER — Other Ambulatory Visit: Payer: Self-pay | Admitting: Internal Medicine

## 2016-05-09 NOTE — Telephone Encounter (Signed)
Done hardcopy to Corinne  

## 2016-05-09 NOTE — Telephone Encounter (Signed)
Rx faxed to pharmacy  

## 2016-05-15 NOTE — Telephone Encounter (Signed)
Received letter this a.m. Stating patient was approved for Lialda patient assistance through [employer] until 08/2016. Called patient and notified him and daughter Arrie Aran.

## 2016-05-23 ENCOUNTER — Ambulatory Visit (INDEPENDENT_AMBULATORY_CARE_PROVIDER_SITE_OTHER): Payer: Medicare Other | Admitting: Internal Medicine

## 2016-05-23 ENCOUNTER — Encounter: Payer: Self-pay | Admitting: Internal Medicine

## 2016-05-23 VITALS — BP 126/72 | HR 82 | Temp 97.4°F | Resp 20 | Wt 233.0 lb

## 2016-05-23 DIAGNOSIS — R7302 Impaired glucose tolerance (oral): Secondary | ICD-10-CM

## 2016-05-23 DIAGNOSIS — M5441 Lumbago with sciatica, right side: Secondary | ICD-10-CM | POA: Diagnosis not present

## 2016-05-23 DIAGNOSIS — I10 Essential (primary) hypertension: Secondary | ICD-10-CM

## 2016-05-23 DIAGNOSIS — M5442 Lumbago with sciatica, left side: Secondary | ICD-10-CM

## 2016-05-23 DIAGNOSIS — E785 Hyperlipidemia, unspecified: Secondary | ICD-10-CM | POA: Diagnosis not present

## 2016-05-23 DIAGNOSIS — Z23 Encounter for immunization: Secondary | ICD-10-CM

## 2016-05-23 NOTE — Progress Notes (Signed)
Subjective:    Patient ID: [name] [name], male    DOB: [birth year], 75 y.o.   MRN: [id number]  HPI    Here to f/u, c/o persistent > 3 mo LBP with bilat pain radiating to the legs with exertion of only 100 yds or less, overall mild to mod but persistent, intermittent and bothersome.  Nothing else makes better or worse.  Has long hx of vascular disease but after tx per Dr Gwenlyn Found, this pain seems different.  Declines all pain med b/c "they make me stupid", has tried gabapentin in the past, and delcines MRI due to claustrophobia, and cost.  Decelines ortho due to cost  Asks for handicapped parking application No recent falls.  Pt denies fever, wt loss, night sweats, loss of appetite, or other constitutional symptoms  Denies urinary symptoms such as dysuria, frequency, urgency, flank pain, hematuria or n/v, fever, chills.  Denies worsening reflux, abd pain, dysphagia, n/v, bowel change or blood.  Pt denies polydipsia, polyuria,  Pt states overall good compliance with meds, trying to follow lower cholesterol, diabetic diet, wt overall stable but little exercise however. Due to pain. Past Medical History:  Diagnosis Date  . Abdominal pain, generalized 08/15/2008  . Abdominal pain, unspecified site 10/16/2009  . Anal fissure 04/24/2008  . Arthritis   . B12 deficiency 10/11/2014  . BACK PAIN 01/10/2009  . Barrett's esophagus 1997  . BARRETTS ESOPHAGUS 08/16/2007  . BREAST HYPERTROPHY 02/16/2009  . CARBUNCLE, NECK 02/14/2008  . Cataract   . CHEST PAIN 10/10/2008  . DEPRESSION 10/16/2009  . DIVERTICULOSIS, COLON 06/28/2007  . Full dentures   . GERD 06/28/2007  . GLUCOSE INTOLERANCE 08/15/2008  . Headache(784.0) 11/13/2009  . HERNIA, UMBILICAL W/OBSTRUCTION W/O GANGRENE 06/28/2007  . HIP PAIN, RIGHT 10/16/2009  . HTN (hypertension) 07/12/2012  . HYPERLIPIDEMIA 06/28/2007  . HYPERTENSION 06/28/2007  . Impaired glucose tolerance 04/13/2011  . INSOMNIA-SLEEP DISORDER-UNSPEC 10/10/2008  . LEG PAIN, BILATERAL 02/16/2009  .  LOW BACK PAIN 06/28/2007  . MASTITIS 01/10/2009  . NECK PAIN 05/31/2008  . NEPHROLITHIASIS, HX OF 06/28/2007  . PROCTOSIGMOIDITIS, ULCERATIVE 1997  . PVD (peripheral vascular disease) with claudication, lifestyle limiting, ABI rt. 0.67, lt. 0.73 07/12/2012  . S/P angioplasty with stent, after DB athrectomy to Rt. ext. iliac and Rt. SFA 07/12/2012  . Skin cancer    right hand  . Stenosis of left carotid artery, mod to severe by dopplers 09/29/2012  . TESTICULAR PAIN, RIGHT 10/25/2010  . VITAMIN D DEFICIENCY 11/13/2009   Past Surgical History:  Procedure Laterality Date  . anal fissure surgury     pt unsure of this  . ATHERECTOMY  09/28/2012  . ATHERECTOMY N/A 07/12/2012   Procedure: ATHERECTOMY;  Surgeon: [name], MD;  Location: [facility] CATH LAB;  Service: Cardiovascular;  Laterality: N/A;  . ATHERECTOMY N/A 09/28/2012   Procedure: ATHERECTOMY;  Surgeon: [name], MD;  Location: [facility] CATH LAB;  Service: Cardiovascular;  Laterality: N/A;  . bilateral inguinal hernia with mesh and umbilical hernia repairs  09/2007  . CATARACT EXTRACTION     bilateral  . COLONOSCOPY    . GROIN DISSECTION Right 07/20/2013   Procedure: GROIN EXPLORATION, EXCISIONAL BIOPSY RIGHT INGUINAL LYMPH NODES;  Surgeon: Harl Bowie, MD;  Location: [facility];  Service: General;  Laterality: Right;  . HERNIA REPAIR    . left knee arthroscopy    . PERCUTANEOUS STENT INTERVENTION  09/28/2012   Procedure: PERCUTANEOUS STENT INTERVENTION;  Surgeon: [name],  MD;  Location: [facility] LAB;  Service: Cardiovascular;;    reports that he quit smoking about 43 years ago. His smokeless tobacco use includes Chew. He reports that he drinks about 3.0 oz of alcohol per week . He reports that he does not use drugs. family history includes COPD in his father and mother; Cerebral aneurysm in his mother. Allergies  Allergen Reactions  . Contrast Media [Iodinated Diagnostic Agents]     Break out in welts  .  Lipitor [Atorvastatin] Other (See Comments)    myalgias  . Lovastatin     Muscle pain  . Statins     Legs pain   Current Outpatient Prescriptions on File Prior to Visit  Medication Sig Dispense Refill  . aspirin EC 81 MG EC tablet Take 1 tablet (81 mg total) by mouth daily. 30 tablet   . clopidogrel (PLAVIX) 75 MG tablet Take 1 tablet by mouth  daily 90 tablet 2  . ezetimibe (ZETIA) 10 MG tablet Take 1 tablet (10 mg total) by mouth daily. 90 tablet 2  . lisinopril-hydrochlorothiazide (PRINZIDE,ZESTORETIC) 20-25 MG tablet Take 1 tablet by mouth daily. 90 tablet 2  . mesalamine (LIALDA) 1.2 g EC tablet Take 2 tablets (2.4 g total) by mouth daily with breakfast. 180 tablet 1  . naproxen (NAPROSYN) 500 MG tablet Take 1 tablet (500 mg total) by mouth 2 (two) times daily with a meal. 60 tablet 0  . pantoprazole (PROTONIX) 40 MG tablet Take 1 tablet by mouth  daily 90 tablet 3  . zolpidem (AMBIEN CR) 12.5 MG CR tablet TAKE 1 TABLET BY MOUTH AT BEDTIME AS NEEDED FOR SLEEP. 30 tablet 5   No current facility-administered medications on file prior to visit.    Review of Systems  Constitutional: Negative for unusual diaphoresis or night sweats HENT: Negative for ear swelling or discharge Eyes: Negative for worsening visual haziness  Respiratory: Negative for choking and stridor.   Gastrointestinal: Negative for distension or worsening eructation Genitourinary: Negative for retention or change in urine volume.  Musculoskeletal: Negative for other MSK pain or swelling Skin: Negative for color change and worsening wound Neurological: Negative for tremors and numbness other than noted  Psychiatric/Behavioral: Negative for decreased concentration or agitation other than above       Objective:   Physical Exam BP 126/72   Pulse 82   Temp 97.4 F (36.3 C) (Oral)   Resp 20   Wt 233 lb (105.7 kg)   SpO2 98%   BMI 35.43 kg/m  VS noted,  Constitutional: Pt appears in no apparent distress HENT:  Head: NCAT.  Right Ear: External ear normal.  Left Ear: External ear normal.  Eyes: . Pupils are equal, round, and reactive to light. Conjunctivae and EOM are normal Neck: Normal range of motion. Neck supple.  Cardiovascular: Normal rate and regular rhythm.   Pulmonary/Chest: Effort normal and breath sounds without rales or wheezing.  Abd:  Soft, NT, ND, + BS Spine nontender Neurological: Pt is alert. Not confused , motor 5/5 intact Skin: Skin is warm. No rash, no LE edema Psychiatric: Pt behavior is normal. No agitation.      Assessment & Plan:

## 2016-05-23 NOTE — Patient Instructions (Addendum)
You had the flu shot today  Your handicapped parking application is signed today  Please continue all other medications as before, and refills have been done if requested.  Please have the pharmacy call with any other refills you may need.  Please continue your efforts at being more active, low cholesterol diet, and weight control.  You are otherwise up to date with prevention measures today.  Please keep your appointments with your specialists as you may have planned  We can hold on more blood work today  Please call if you change your mind about the MRI for the lower back, and seeing Dr Gladstone Lighter.  Please return in 6 months, or sooner if needed, with Lab testing done 3-5 days before

## 2016-05-23 NOTE — Progress Notes (Signed)
Pre visit review using our clinic review tool, if applicable. No additional management support is needed unless otherwise documented below in the visit note. 

## 2016-05-24 NOTE — Assessment & Plan Note (Signed)
Suspect underlying lumbar disc dz, spinal stenosis and possible neurogenic claudiation, but pt declines pain control, MRI or referral to Dr Christophe Louis whom he has seen several yrs ago; ok for handicapped parking applic filled out

## 2016-05-24 NOTE — Assessment & Plan Note (Signed)
stable overall by history and exam, recent data reviewed with pt, and pt to continue medical treatment as before,  to f/u any worsening symptoms or concerns' Lab Results  Component Value Date   HGBA1C 5.9 11/16/2015

## 2016-05-24 NOTE — Assessment & Plan Note (Signed)
Goal < 70, stable overall by history and exam, recent data reviewed with pt, and pt to continue medical treatment as before,  to f/u any worsening symptoms or concerns Lab Results  Component Value Date   LDLCALC 94 11/16/2015   For f/u lab

## 2016-05-24 NOTE — Assessment & Plan Note (Signed)
stable overall by history and exam, recent data reviewed with pt, and pt to continue medical treatment as before,  to f/u any worsening symptoms or concerns BP Readings from Last 3 Encounters:  05/23/16 126/72  04/10/16 122/66  01/24/16 124/61

## 2016-06-10 ENCOUNTER — Encounter: Payer: Self-pay | Admitting: Internal Medicine

## 2016-06-10 ENCOUNTER — Ambulatory Visit (INDEPENDENT_AMBULATORY_CARE_PROVIDER_SITE_OTHER): Payer: Medicare Other | Admitting: Internal Medicine

## 2016-06-10 VITALS — BP 128/82 | HR 81 | Ht 68.5 in | Wt 235.0 lb

## 2016-06-10 DIAGNOSIS — N62 Hypertrophy of breast: Secondary | ICD-10-CM | POA: Diagnosis not present

## 2016-06-10 LAB — PROLACTIN: PROLACTIN: 4.1 ng/mL (ref 2.0–18.0)

## 2016-06-10 MED ORDER — TAMOXIFEN CITRATE 20 MG PO TABS: 20.0000 mg | ORAL_TABLET | Freq: Every day | ORAL | 1 refills | Status: DC

## 2016-06-10 NOTE — Patient Instructions (Addendum)
Please stop at the lab.  Please reduce alcohol to less than 2 drinks daily.  We will schedule your breast ultrasound and call you with the schedule. Please let me know if you were not called within next week.  Please start Tamoxifen 20 mg daily after results of the Ultrasound are back.  Please come back for a follow-up appointment in 4 months.

## 2016-06-10 NOTE — Progress Notes (Addendum)
Patient ID: [name] [name], male   DOB: 12/26/1940, 75 y.o.   MRN: [id number]   HPI: [name] [name] is a 75 y.o.-year-old man, referred by his PCP, [name], MD, for evaluation and management of gynecomastia.  Pt noticed breast pain and felt a knot in L breast ~2008 >> he had a mammogram (per review of the chart, in 2010):   He had L surgery (liposuction? - I was not able to find the records in his chart) >> benign tissue. No milk or blood discharge. He describes that 2 years ago, he started to have more sensitivity and pain in both breasts.   No changes in his meds recently. He is on an ACE inhibitor, but not Captopril or Enalapril. He denies steroids po or intra-articular, and is not on opiates.  He denies OTC meds or supplements, denies drug use including THC, no smoking, no chewing tobacco, no cigars. No anabolic steroid use. However, he drinks more than 3-4 beers a day. He has been doing this for a long time.  He has no problems with erections (however, we did not discuss about this in detail, in the presence of his daughter) but does have decreased libido. He does not have hot flushes.   No HAs, visual problems except an episode of double vision (resolved) 2/2 a previous stroke. Denies increase in hands and feet size.   No fractures or h/o osteoporosis. no history of height loss.  No h/o Prostate cancer; no enlargement/shrinking of testicles, testicular masses, or pain. No penile discharge. She had 3 hernia surgeries in 2009: 1 ventral and bilateral inguinal.  He went through puberty normally.   Denies mm fasciculations, tremors.   No h/o liver ds., CKD. + PVD - s/p stents.   Unknown FH of breast or ovarian cancer.   Reviewed pertinent labs: Normal LFTs,beta hCG not increased, testosterone and LH normal. Estradiol slightly high. Component     Latest Ref Rng & Units 11/16/2015 04/10/2016  Total Bilirubin     0.2 - 1.2 mg/dL 0.7   Bilirubin, Direct     0.0 - 0.3 mg/dL 0.1     Alkaline Phosphatase     39 - 117 U/L 48   AST     0 - 37 U/L 17   ALT     0 - 53 U/L 21   Total Protein     6.0 - 8.3 g/dL 6.7   Albumin     3.5 - 5.2 g/dL 4.2   Preg, Serum       NEGATIVE  Estradiol     <=39 pg/mL  49 (H)  LH     3.10 - 34.60 mIU/mL  6.43  Testosterone     300.00 - 890.00 ng/dL  382.01   No hyperthyroidism: Lab Results  Component Value Date   TSH 1.59 11/16/2015   TSH 1.28 01/16/2015   TSH 1.41 10/14/2013   TSH 1.93 10/18/2012   TSH 2.41 10/20/2011   TSH 2.60 10/16/2009   TSH 1.94 08/08/2008   TSH 2.78 08/16/2007   She had normal adrenal on CT abdomen in 2011.  ROS: Constitutional: + weight gain/loss, no fatigue, no subjective hyperthermia/hypothermia, + Poor sleep Eyes: no blurry vision, no xerophthalmia ENT: no sore throat, no nodules palpated in throat, no dysphagia/odynophagia, no hoarseness, + hypoacusis, + tinnitus Cardiovascular: no CP/SOB/palpitations/leg swelling Respiratory: no cough/SOB Gastrointestinal: no N/V/D/C/ + heartburn Musculoskeletal: + muscle/+ joint aches Skin: no rashes, + easy bruising Neurological: no tremors/numbness/tingling/dizziness Psychiatric: no  depression/anxiety + See history of present illness  Past Medical History:  Diagnosis Date  . Abdominal pain, generalized 08/15/2008  . Abdominal pain, unspecified site 10/16/2009  . Anal fissure 04/24/2008  . Arthritis   . B12 deficiency 10/11/2014  . BACK PAIN 01/10/2009  . Barrett's esophagus 1997  . BARRETTS ESOPHAGUS 08/16/2007  . BREAST HYPERTROPHY 02/16/2009  . CARBUNCLE, NECK 02/14/2008  . Cataract   . CHEST PAIN 10/10/2008  . DEPRESSION 10/16/2009  . DIVERTICULOSIS, COLON 06/28/2007  . Full dentures   . GERD 06/28/2007  . GLUCOSE INTOLERANCE 08/15/2008  . Headache(784.0) 11/13/2009  . HERNIA, UMBILICAL W/OBSTRUCTION W/O GANGRENE 06/28/2007  . HIP PAIN, RIGHT 10/16/2009  . HTN (hypertension) 07/12/2012  . HYPERLIPIDEMIA 06/28/2007  . HYPERTENSION 06/28/2007  .  Impaired glucose tolerance 04/13/2011  . INSOMNIA-SLEEP DISORDER-UNSPEC 10/10/2008  . LEG PAIN, BILATERAL 02/16/2009  . LOW BACK PAIN 06/28/2007  . MASTITIS 01/10/2009  . NECK PAIN 05/31/2008  . NEPHROLITHIASIS, HX OF 06/28/2007  . PROCTOSIGMOIDITIS, ULCERATIVE 1997  . PVD (peripheral vascular disease) with claudication, lifestyle limiting, ABI rt. 0.67, lt. 0.73 07/12/2012  . S/P angioplasty with stent, after DB athrectomy to Rt. ext. iliac and Rt. SFA 07/12/2012  . Skin cancer    right hand  . Stenosis of left carotid artery, mod to severe by dopplers 09/29/2012  . TESTICULAR PAIN, RIGHT 10/25/2010  . VITAMIN D DEFICIENCY 11/13/2009   Past Surgical History:  Procedure Laterality Date  . anal fissure surgury     pt unsure of this  . ATHERECTOMY  09/28/2012  . ATHERECTOMY N/A 07/12/2012   Procedure: ATHERECTOMY;  Surgeon: [name], MD;  [facility] C[facility] LAB;  Service: Cardiovascular;  Laterality: N/A;  . ATHERECTOMY N/A 09/28/2012   Procedure: ATHERECTOMY;  Surgeon: [name], MD;  Location: [facility] CATH LAB;  Service: Cardiovascular;  Laterality: N/A;  . bilateral inguinal hernia with mesh and umbilical hernia repairs  09/2007  . CATARACT EXTRACTION     bilateral  . COLONOSCOPY    . GROIN DISSECTION Right 07/20/2013   Procedure: GROIN EXPLORATION, EXCISIONAL BIOPSY RIGHT INGUINAL LYMPH NODES;  Surgeon: Harl Bowie, MD;  Location: [facility];  Service: General;  Laterality: Right;  . HERNIA REPAIR    . left knee arthroscopy    . PERCUTANEOUS STENT INTERVENTION  09/28/2012   Procedure: PERCUTANEOUS STENT INTERVENTION;  Surgeon: [name], MD;  Location: [facility] CATH LAB;  Service: Cardiovascular;;   Social History   Social History  . Marital status: Divorced    Spouse name: N/A  . Number of children: 3  . Years of education: N/A   Occupational History  . retired Administrator    Social History Main Topics  . Smoking status: Former Smoker    Quit date:  09/08/1972  . Smokeless tobacco: Current User    Types: Chew  . Alcohol use     3-4 Cans of beer per day       . Drug use: No   Social History Narrative   Daily caffeine 1 cup coffee daily   Current Outpatient Prescriptions on File Prior to Visit  Medication Sig Dispense Refill  . aspirin EC 81 MG EC tablet Take 1 tablet (81 mg total) by mouth daily. 30 tablet   . clopidogrel (PLAVIX) 75 MG tablet Take 1 tablet by mouth  daily 90 tablet 2  . ezetimibe (ZETIA) 10 MG tablet Take 1 tablet (10 mg total) by mouth daily. 90 tablet 2  .  lisinopril-hydrochlorothiazide (PRINZIDE,ZESTORETIC) 20-25 MG tablet Take 1 tablet by mouth daily. 90 tablet 2  . mesalamine (LIALDA) 1.2 g EC tablet Take 2 tablets (2.4 g total) by mouth daily with breakfast. 180 tablet 1  . naproxen (NAPROSYN) 500 MG tablet Take 1 tablet (500 mg total) by mouth 2 (two) times daily with a meal. 60 tablet 0  . pantoprazole (PROTONIX) 40 MG tablet Take 1 tablet by mouth  daily 90 tablet 3  . zolpidem (AMBIEN CR) 12.5 MG CR tablet TAKE 1 TABLET BY MOUTH AT BEDTIME AS NEEDED FOR SLEEP. 30 tablet 5   No current facility-administered medications on file prior to visit.    Allergies  Allergen Reactions  . Contrast Media [Iodinated Diagnostic Agents]     Break out in welts  . Lipitor [Atorvastatin] Other (See Comments)    myalgias  . Lovastatin     Muscle pain  . Statins     Legs pain   Family History  Problem Relation Age of Onset  . COPD Mother   . Cerebral aneurysm Mother   . COPD Father   . Colon cancer Neg Hx   . Esophageal cancer Neg Hx   . Rectal cancer Neg Hx   . Stomach cancer Neg Hx     PE: BP 128/82 (BP Location: Left Arm, Patient Position: Sitting)   Pulse 81   Ht 5' 8.5" (1.74 m)   Wt 235 lb (106.6 kg)   SpO2 98%   BMI 35.21 kg/m   Wt Readings from Last 3 Encounters:  06/10/16 235 lb (106.6 kg)  05/23/16 233 lb (105.7 kg)  04/10/16 232 lb 4 oz (105.3 kg)   Constitutional: obese, in NAD Eyes:  PERRLA, EOMI, no exophthalmos ENT: moist mucous membranes, no thyromegaly, no cervical lymphadenopathy Cardiovascular: RRR, No MRG Respiratory: CTA B Gastrointestinal: abdomen soft, NT, ND, BS+ Musculoskeletal: no deformities, strength intact in all 4 Skin: moist, warm, no rashes Neurological: no tremor with outstretched hands, DTR normal in all 4 Breast exam:+ Breasts appear enlarged, however I could not feel breast tissue in either of the breasts, no axillary lymphadenopathy  ASSESSMENT: 1. Gynecomastia - ? Possible pseudogynecomastia - Bilateral  PLAN:  1. Gynecomastia Pt with bilateral breast enlargement, without discharges, but with increased sensitivity. His breast enlargement is chronic, dating at least since 2008. Previous mammogram it show enlarged breast tissue and per his description, he had surgery in his left breast >> benign findings. I could not find the records of the surgery in his chart. Evaluated multiple possible causes: - Patient is obese, also has slightly increased estradiol, most likely due to our aromatization of testosterone in fat tissue. Estrogen is minimally elevated, and is not associated with decreased LH to suggest a possible testicular tumor. In this case, no further investigation is necessary. - No evidence of hypogonadism - pt without signif. Kidney ds, without liver disease, without thyroid disease - not using THC, anabolic steroids or OTC supplements, however, drinks >3 drinks of beer a day - not on narcotics - no h/o gynecomastia during puberty - no testicular masses or pain (beta hCG normal >> less likely to have Leydig cell tumor) - will check a prolactin level (if elevated, it could indicate possible prolactinoma, although I doubt this. - no mm fasciculations or tremors (to suggest spinal or bulbar mm atrophy) - no h/o prepubertal onset/reduced height/incomplete virilization (to suggest aromatase excess)  Possible causes: - Obesity +/- alcohol  use  - We'll plan to get the new  bilateral breast ultrasound - Today we'll check a prolactin in the new estradiol - if labs and ultrasound normal, patient agrees to start Tamoxifen 20 mg daily for the next 3 months to see if helps in terms of breast size and pain. I explained that this is not a long-term medicine, however, being on the medicine for 3-6 months usually helps to relieve the breast discomfort. In terms of breast size, since he has had increased breast size for almost 10 years, I doubt that this would be influenced by tamoxifen. I discussed with them that liposuction can help with this. - pt and his daughter were given Up to Date handout: Patient information: Gynecomastia (breast enlargement in men) (Beyond the Basics) - RTC in 4 months - recheck size of breasts/pain level    Orders Placed This Encounter  Procedures  . [place] Chest  . Prolactin  . Estradiol   First contact: Daughter's phone number: [phone]  - time spent with the patient: 1 hour, of which >50% was spent in obtaining information about his symptoms, reviewing his previous labs, evaluations, and treatments, counseling him about his condition (please see the discussed topics above), and developing a plan to further investigate and treat it.  Office Visit on 06/10/2016  Component Date Value Ref Range Status  . Prolactin 06/10/2016 4.1  2.0 - 18.0 ng/mL Final  . Estradiol, Free 06/18/2016 1.27* ADULTS: < OR = 0.45 pg/mL Final  . Estradiol 06/18/2016 68* ADULTS: < OR = 29 pg/mL Final   Comment:   This test was developed and its analytical performance characteristics have been determined by [facility]. It has not been cleared or approved by FDA. This assay has been validated pursuant to the CLIA regulations and is used for clinical purposes.    Prolactin level is normal. Estradiol is slightly higher. Therefore, will add a scrotal ultrasound.  Addendum: 07/04/2016:  Breast ultrasound: Minimal bilateral gynecomastia. No sonographic evidence of malignancy involving either breast. 07/04/2016: Testicular ultrasound: No significant abnormality identified. 1.4 cm right epididymal cyst. Pulsed Doppler interrogation of both testes demonstrates normal low resistance arterial and venous waveforms bilaterally.  We'll try to start tamoxifen 20 mg daily for the next 3 months.  Philemon Kingdom, MD PhD [facility]

## 2016-06-12 ENCOUNTER — Other Ambulatory Visit: Payer: Self-pay | Admitting: Internal Medicine

## 2016-06-12 DIAGNOSIS — N62 Hypertrophy of breast: Secondary | ICD-10-CM

## 2016-06-18 LAB — ESTRADIOL, FREE
ESTRADIOL FREE: 1.27 pg/mL — AB (ref <=0.45)
ESTRADIOL: 68 pg/mL — AB (ref <=29)

## 2016-06-19 ENCOUNTER — Telehealth: Payer: Self-pay

## 2016-06-19 ENCOUNTER — Other Ambulatory Visit: Payer: Self-pay | Admitting: Internal Medicine

## 2016-06-19 NOTE — Telephone Encounter (Signed)
Called number and it was the daughter. she states she is on the way over to their house, and will call back so we can discuss the ultrasounds.

## 2016-06-24 ENCOUNTER — Telehealth: Payer: Self-pay | Admitting: Internal Medicine

## 2016-06-24 ENCOUNTER — Other Ambulatory Visit: Payer: Self-pay | Admitting: Internal Medicine

## 2016-06-24 DIAGNOSIS — R7989 Other specified abnormal findings of blood chemistry: Secondary | ICD-10-CM

## 2016-06-24 DIAGNOSIS — N62 Hypertrophy of breast: Secondary | ICD-10-CM

## 2016-06-24 NOTE — Telephone Encounter (Signed)
I ordered a testicular ultrasound. Please have them look at the actual order for the breast ultrasound. I wrote the following in the order: "Gynecomastia. Please do not perform mammogram - pt had this in the past and had a very traumatic experience".

## 2016-06-24 NOTE — Telephone Encounter (Signed)
Pt daughter called, she had not heard from [place] yet to set up Pt appointment, she called [facility], and they told her that they only have the referral to do the breast ultrasound, however they said the Pt would need to do a Mammogram first.  Pt's daughter said that he would not agree to do a mammogram and [facility] told her that they need permission from [place] to do the breast ultrasound without mammogram.  They said they also still need the referral for the testicular imaging.  Pt's daughter would like to get these appointments scheduled as soon as possible.

## 2016-06-25 NOTE — Telephone Encounter (Signed)
[facility] calling to have a ulterior US arterial venous flow doppler IMG # [id number] added to testicular ultrasound please

## 2016-06-26 ENCOUNTER — Other Ambulatory Visit: Payer: Self-pay | Admitting: Internal Medicine

## 2016-06-26 ENCOUNTER — Other Ambulatory Visit: Payer: Self-pay

## 2016-06-26 DIAGNOSIS — E280 Estrogen excess: Secondary | ICD-10-CM

## 2016-06-26 NOTE — Telephone Encounter (Signed)
Why? I do not understand why this is needed. I never ordered this.Marland KitchenMarland Kitchen

## 2016-07-04 ENCOUNTER — Ambulatory Visit
Admission: RE | Admit: 2016-07-04 | Discharge: 2016-07-04 | Disposition: A | Discharge status: Home / Self Care | Payer: Medicare Other | Source: Ambulatory Visit | Attending: Internal Medicine | Admitting: Internal Medicine

## 2016-07-04 DIAGNOSIS — N503 Cyst of epididymis: Secondary | ICD-10-CM | POA: Diagnosis not present

## 2016-07-04 DIAGNOSIS — N644 Mastodynia: Secondary | ICD-10-CM | POA: Diagnosis not present

## 2016-07-07 ENCOUNTER — Ambulatory Visit (INDEPENDENT_AMBULATORY_CARE_PROVIDER_SITE_OTHER): Payer: Medicare Other | Admitting: Podiatry

## 2016-07-07 DIAGNOSIS — M79672 Pain in left foot: Secondary | ICD-10-CM

## 2016-07-07 DIAGNOSIS — M79671 Pain in right foot: Secondary | ICD-10-CM

## 2016-07-07 DIAGNOSIS — L84 Corns and callosities: Secondary | ICD-10-CM | POA: Diagnosis not present

## 2016-07-07 DIAGNOSIS — L851 Acquired keratosis [keratoderma] palmaris et plantaris: Secondary | ICD-10-CM | POA: Diagnosis not present

## 2016-07-07 NOTE — Progress Notes (Signed)
   Subjective:    Patient ID: EREK KOWAL, male    DOB: [birth year], 75 y.o.   MRN: [id number]  HPI    Review of Systems  Gastrointestinal: Positive for blood in stool.  All other systems reviewed and are negative.      Objective:   Physical Exam        Assessment & Plan:

## 2016-07-07 NOTE — Progress Notes (Signed)
Patient ID: [name] [name], male   DOB: [birth year], 75 y.o.   MRN: [id number] Subjective: Patient presents to the office today for chief complaint of painful callus lesions of the feet. Patient states that the pain is ongoing and is affecting their ability to ambulate without pain. Patient presents today for further treatment and evaluation.  Objective:  Physical Exam General: Alert and oriented x3 in no acute distress  Dermatology: Hyperkeratotic lesion present on the plantar aspect of the right foot 4. Pain on palpation with a central nucleated core noted.  Skin is warm, dry and supple bilateral lower extremities. Negative for open lesions or macerations.  Vascular: Palpable pedal pulses bilaterally. No edema or erythema noted. Capillary refill within normal limits.  Neurological: Epicritic and protective threshold grossly intact bilaterally.   Musculoskeletal Exam: Pain on palpation at the keratotic lesion noted. Range of motion within normal limits bilateral. Muscle strength 5/5 in all groups bilateral.  Assessment: #1 porokeratosis right plantar foot 4 #2 pain in right foot   Plan of Care:  #1 Patient evaluated #2 Excisional debridement of  keratoic lesion using a chisel blade was performed without incident.  #3 Treated area(s) with Salinocaine and dressed with light dressing. #4 Patient is to return to the clinic PRN.   Edrick Kins, [place]

## 2016-07-08 ENCOUNTER — Telehealth: Payer: Self-pay

## 2016-07-08 NOTE — Telephone Encounter (Signed)
Spoke with patient daughter. She had no questions about the ultrasounds and would let her dad know the news.

## 2016-08-14 ENCOUNTER — Other Ambulatory Visit: Payer: Self-pay | Admitting: Sports Medicine

## 2016-08-14 DIAGNOSIS — M5441 Lumbago with sciatica, right side: Secondary | ICD-10-CM | POA: Diagnosis not present

## 2016-08-14 DIAGNOSIS — M5442 Lumbago with sciatica, left side: Principal | ICD-10-CM

## 2016-08-14 DIAGNOSIS — G8929 Other chronic pain: Secondary | ICD-10-CM | POA: Diagnosis not present

## 2016-08-15 ENCOUNTER — Ambulatory Visit
Admission: RE | Admit: 2016-08-15 | Discharge: 2016-08-15 | Disposition: A | Discharge status: Home / Self Care | Payer: Medicare Other | Source: Ambulatory Visit | Attending: Sports Medicine | Admitting: Sports Medicine

## 2016-08-15 DIAGNOSIS — M5441 Lumbago with sciatica, right side: Principal | ICD-10-CM

## 2016-08-15 DIAGNOSIS — G8929 Other chronic pain: Secondary | ICD-10-CM

## 2016-08-15 DIAGNOSIS — M5126 Other intervertebral disc displacement, lumbar region: Secondary | ICD-10-CM | POA: Diagnosis not present

## 2016-08-15 DIAGNOSIS — M5442 Lumbago with sciatica, left side: Principal | ICD-10-CM

## 2016-08-25 ENCOUNTER — Other Ambulatory Visit: Payer: Self-pay | Admitting: Internal Medicine

## 2016-08-25 ENCOUNTER — Other Ambulatory Visit: Payer: Self-pay | Admitting: Gastroenterology

## 2016-08-27 ENCOUNTER — Other Ambulatory Visit: Payer: Self-pay | Admitting: Gastroenterology

## 2016-08-29 DIAGNOSIS — M5441 Lumbago with sciatica, right side: Secondary | ICD-10-CM | POA: Diagnosis not present

## 2016-08-29 DIAGNOSIS — G8929 Other chronic pain: Secondary | ICD-10-CM | POA: Diagnosis not present

## 2016-08-29 DIAGNOSIS — M48062 Spinal stenosis, lumbar region with neurogenic claudication: Secondary | ICD-10-CM | POA: Diagnosis not present

## 2016-08-29 DIAGNOSIS — M5442 Lumbago with sciatica, left side: Secondary | ICD-10-CM | POA: Diagnosis not present

## 2016-09-16 ENCOUNTER — Telehealth: Payer: Self-pay | Admitting: Cardiovascular Disease

## 2016-09-16 DIAGNOSIS — M48062 Spinal stenosis, lumbar region with neurogenic claudication: Secondary | ICD-10-CM | POA: Diagnosis not present

## 2016-09-16 DIAGNOSIS — M5442 Lumbago with sciatica, left side: Secondary | ICD-10-CM | POA: Diagnosis not present

## 2016-09-16 DIAGNOSIS — M5441 Lumbago with sciatica, right side: Secondary | ICD-10-CM | POA: Diagnosis not present

## 2016-09-16 DIAGNOSIS — G8929 Other chronic pain: Secondary | ICD-10-CM | POA: Diagnosis not present

## 2016-09-16 NOTE — Telephone Encounter (Signed)
Will need pharmacologic Myoview stress test to risk stratify him. Okay to interrupt antiplatelet therapy for his lumbar surgery however.

## 2016-09-16 NOTE — Telephone Encounter (Signed)
Requesting surgical clearance:  1. Type of surgery: Lumbar Decompression  2. Surgeon: Johnn Hai, MD  3.Surgical Date:  pending  4. Medications that need to be held: ASA 81 & Plavix--7 days prior   5. CAD: No  6. I will defer to:  Dr. Pearla Dubonnet Information:   [facility] Fax:  [phone]

## 2016-09-19 ENCOUNTER — Other Ambulatory Visit: Payer: Self-pay | Admitting: Cardiovascular Disease

## 2016-09-19 DIAGNOSIS — Z01810 Encounter for preprocedural cardiovascular examination: Secondary | ICD-10-CM

## 2016-09-19 NOTE — Telephone Encounter (Signed)
Left message on vm--ok per DPR. Message sent to scheduling to set up [place].

## 2016-09-22 ENCOUNTER — Telehealth: Payer: Self-pay

## 2016-09-22 NOTE — Telephone Encounter (Signed)
Faxed [employer] patient assistance papers for patient. Waiting on response. Patient notified we will contact him as soon as we here from [place].

## 2016-09-23 ENCOUNTER — Telehealth (HOSPITAL_COMMUNITY): Payer: Self-pay

## 2016-09-23 NOTE — Telephone Encounter (Signed)
Lexiscan scheduled for 09/25/16. Routing message to Dr. Tonita Cong.

## 2016-09-23 NOTE — Telephone Encounter (Signed)
Encounter complete. 

## 2016-09-25 ENCOUNTER — Inpatient Hospital Stay (HOSPITAL_COMMUNITY): Admission: RE | Admit: 2016-09-25 | Payer: Medicare Other | Source: Ambulatory Visit

## 2016-09-29 NOTE — Telephone Encounter (Signed)
[name] [name] rescheduled to 09/30/16.

## 2016-09-30 ENCOUNTER — Ambulatory Visit (HOSPITAL_COMMUNITY)
Admission: RE | Admit: 2016-09-30 | Discharge: 2016-09-30 | Disposition: A | Discharge status: Home / Self Care | Payer: Medicare Other | Source: Ambulatory Visit | Attending: Cardiovascular Disease | Admitting: Cardiovascular Disease

## 2016-09-30 DIAGNOSIS — Z01810 Encounter for preprocedural cardiovascular examination: Secondary | ICD-10-CM | POA: Diagnosis not present

## 2016-09-30 LAB — MYOCARDIAL PERFUSION IMAGING
CHL CUP NUCLEAR SDS: 1
CHL CUP NUCLEAR SRS: 1
CHL CUP NUCLEAR SSS: 2
LV dias vol: 85 mL (ref 62–150)
LVSYSVOL: 37 mL
NUC STRESS TID: 1.22
Peak HR: 84 {beats}/min
Rest HR: 72 {beats}/min

## 2016-09-30 MED ORDER — REGADENOSON 0.4 MG/5ML IV SOLN
0.4000 mg | Freq: Once | INTRAVENOUS | Status: AC
  Administered 2016-09-30: 0.4 mg via INTRAVENOUS

## 2016-09-30 MED ORDER — AMINOPHYLLINE 25 MG/ML IV SOLN
75.0000 mg | Freq: Once | INTRAVENOUS | Status: AC
  Administered 2016-09-30: 75 mg via INTRAVENOUS

## 2016-09-30 MED ORDER — TECHNETIUM TC 99M TETROFOSMIN IV KIT
10.5000 | PACK | Freq: Once | INTRAVENOUS | Status: AC | PRN
  Administered 2016-09-30: 10.5 via INTRAVENOUS
  Filled 2016-09-30: qty 11

## 2016-09-30 MED ORDER — TECHNETIUM TC 99M TETROFOSMIN IV KIT
30.1000 | PACK | Freq: Once | INTRAVENOUS | Status: AC | PRN
  Administered 2016-09-30: 30.1 via INTRAVENOUS
  Filled 2016-09-30: qty 31

## 2016-10-02 NOTE — Telephone Encounter (Signed)
[name] Harp, MD  [name], MD        Essentially normal study. Repeat when clinically indicated.    Is pt cleared for procedure?

## 2016-10-03 ENCOUNTER — Telehealth: Payer: Self-pay | Admitting: *Deleted

## 2016-10-03 NOTE — Telephone Encounter (Signed)
Patient is needing clearance for lumbar decompression and also directions regarding plavix. Will forward for dr berry's review and advise.

## 2016-10-05 NOTE — Telephone Encounter (Signed)
MV low risk. Cleared for lumbar laminectomy at low risk. OK to interrupt anti platelet Rx

## 2016-10-07 NOTE — Telephone Encounter (Signed)
Received fax from [facility] patient assistance that we are missing proof of income, high deductible and physician signature. Called [facility] [employer] and they states the date on the paper work that Dr. Fuller Plan signed states 09/20/15. Asked if I could give a verbal ok to get the patient assistance going. She states no but they are also missing the other things as well. She states that the proof of income does not match the paperwork and we need a test claim ran for the proof of co-pay for patient. Informed her that I will get these things and re-fax paperwork. Called patient's daughter Arrie Aran) and she states she is waiting on the proof of co-pay to be faxed to them but the W-2 should match the paperwork. I informed [name] that after I faxed the paperwork I mailed the personal info back to her dad since I thought he would want to keep it and did not have that scanned into our system. [name] states as soon as gets the info we need mailed to her dad, she will bring the w-2 and the proof of co-pay up to the office. [name] did say her dad will run out of Lialda this weekend so if cannot get the appropriate paper-work this week then she might have to switch him to what he was taking two years ago. Also may need a new prescription for it.

## 2016-10-07 NOTE — Telephone Encounter (Signed)
Will fax this note to the number provided. 

## 2016-10-08 ENCOUNTER — Ambulatory Visit: Payer: Self-pay | Admitting: Orthopedic Surgery

## 2016-10-09 ENCOUNTER — Other Ambulatory Visit: Payer: Self-pay

## 2016-10-09 MED ORDER — MESALAMINE 1.2 G PO TBEC: DELAYED_RELEASE_TABLET | ORAL | 0 refills | Status: DC

## 2016-10-13 ENCOUNTER — Ambulatory Visit: Payer: Medicare Other | Admitting: Internal Medicine

## 2016-10-13 ENCOUNTER — Other Ambulatory Visit: Payer: Self-pay | Admitting: Cardiovascular Disease

## 2016-10-14 ENCOUNTER — Ambulatory Visit: Payer: Self-pay | Admitting: Orthopedic Surgery

## 2016-10-14 NOTE — H&P (Signed)
[name] [name] is an 76 y.o. male.   Chief Complaint: back and leg pain HPI: The patient is a 76 year old male who presents with back pain. The patient is here today in referral from Dr. Delilah Shan. The patient reports low back symptoms including pain which began year(s) ago without any known injury. Symptoms are reported to be located in the low back and Symptoms include pain, tingling (feet) and weakness (BLE). The pain radiates to the left buttock, left posterior thigh, left posterior lower leg, right buttock, right posterior thigh and right posterior lower leg. The patient describes the pain as aching. The patient describes the severity of their symptoms as moderate to severe. Symptoms are exacerbated by standing (and walking). The patient is not currently being treated for this problem. Prior to being seen today the patient was previously evaluated by Dr. Delilah Shan. Past evaluation has included x-ray of the lumbar spine and MRI of the lumbar spine. Past treatment has included activity modification.  Caedmon Louque follows up. He is having chronic back pain, mainly into the leg, worse with activity, better with rest. Dr. Delilah Shan had obtained an MRI of the lumbar spine which indicated multilevel spinal stenosis from epidural lipomatosis and degenerative changes at L2-3, L3-4 and L4-5, particularly at L4-5. There is a minimal offset at L4-5.  He reports he cannot walk. He has to sit. His legs give out when he attempts to walk to the mailbox. He does not want injections. He is trying a home exercise program, activity modification. He states this has been worsening over the past 10 years. He would like to discuss surgical options for it.  Past Medical History:  Diagnosis Date  . Abdominal pain, generalized 08/15/2008  . Abdominal pain, unspecified site 10/16/2009  . Anal fissure 04/24/2008  . Arthritis   . B12 deficiency 10/11/2014  . BACK PAIN 01/10/2009  . Barrett's esophagus 1997  . BARRETTS ESOPHAGUS 08/16/2007   . BREAST HYPERTROPHY 02/16/2009  . CARBUNCLE, NECK 02/14/2008  . Cataract   . CHEST PAIN 10/10/2008  . DEPRESSION 10/16/2009  . DIVERTICULOSIS, COLON 06/28/2007  . Full dentures   . GERD 06/28/2007  . GLUCOSE INTOLERANCE 08/15/2008  . Headache(784.0) 11/13/2009  . HERNIA, UMBILICAL W/OBSTRUCTION W/O GANGRENE 06/28/2007  . HIP PAIN, RIGHT 10/16/2009  . HTN (hypertension) 07/12/2012  . HYPERLIPIDEMIA 06/28/2007  . HYPERTENSION 06/28/2007  . Impaired glucose tolerance 04/13/2011  . INSOMNIA-SLEEP DISORDER-UNSPEC 10/10/2008  . LEG PAIN, BILATERAL 02/16/2009  . LOW BACK PAIN 06/28/2007  . MASTITIS 01/10/2009  . NECK PAIN 05/31/2008  . NEPHROLITHIASIS, HX OF 06/28/2007  . PROCTOSIGMOIDITIS, ULCERATIVE 1997  . PVD (peripheral vascular disease) with claudication, lifestyle limiting, ABI rt. 0.67, lt. 0.73 07/12/2012  . S/P angioplasty with stent, after DB athrectomy to Rt. ext. iliac and Rt. SFA 07/12/2012  . Skin cancer    right hand  . Stenosis of left carotid artery, mod to severe by dopplers 09/29/2012  . TESTICULAR PAIN, RIGHT 10/25/2010  . VITAMIN D DEFICIENCY 11/13/2009    Past Surgical History:  Procedure Laterality Date  . anal fissure surgury     pt unsure of this  . ATHERECTOMY  09/28/2012  . ATHERECTOMY N/A 07/12/2012   Procedure: ATHERECTOMY;  Surgeon: [name], MD;  Location: [facility] CATH LAB;  Service: Cardiovascular;  Laterality: N/A;  . ATHERECTOMY N/A 09/28/2012   Procedure: ATHERECTOMY;  Surgeon: [name], MD;  Location: [facility] CATH LAB;  Service: Cardiovascular;  Laterality: N/A;  . bilateral inguinal hernia  with mesh and umbilical hernia repairs  09/2007  . CATARACT EXTRACTION     bilateral  . COLONOSCOPY    . GROIN DISSECTION Right 07/20/2013   Procedure: GROIN EXPLORATION, EXCISIONAL BIOPSY RIGHT INGUINAL LYMPH NODES;  Surgeon: Harl Bowie, MD;  Location: [place];  Service: General;  Laterality: Right;  . HERNIA REPAIR    . left knee arthroscopy     . PERCUTANEOUS STENT INTERVENTION  09/28/2012   Procedure: PERCUTANEOUS STENT INTERVENTION;  Surgeon: [name], MD;  Location: [facility] C[facility] LAB;  Service: Cardiovascular;;    Family History  Problem Relation Age of Onset  . COPD Mother   . Cerebral aneurysm Mother   . COPD Father   . Colon cancer Neg Hx   . Esophageal cancer Neg Hx   . Rectal cancer Neg Hx   . Stomach cancer Neg Hx    Social History:  reports that he quit smoking about 44 years ago. His smokeless tobacco use includes Chew. He reports that he drinks about 3.0 oz of alcohol per week . He reports that he does not use drugs.  Allergies:  Allergies  Allergen Reactions  . Contrast Media [Iodinated Diagnostic Agents]     Break out in welts  . Lipitor [Atorvastatin] Other (See Comments)    myalgias  . Lovastatin     Muscle pain  . Statins     Legs pain     (Not in a hospital admission)  No results found for this or any previous visit (from the past 48 hour(s)). No results found.  Review of Systems  Constitutional: Negative.   HENT: Negative.   Eyes: Negative.   Respiratory: Negative.   Cardiovascular: Negative.   Gastrointestinal: Negative.   Genitourinary: Negative.   Musculoskeletal: Positive for back pain.  Skin: Negative.   Neurological: Positive for sensory change and focal weakness.  Psychiatric/Behavioral: Negative.     There were no vitals taken for this visit. Physical Exam  Constitutional: He is oriented to person, place, and time. He appears well-developed.  HENT:  Head: Normocephalic.  Eyes: Pupils are equal, round, and reactive to light.  Neck: Normal range of motion.  Cardiovascular: Normal rate.   Respiratory: Effort normal.  GI: Soft.  Musculoskeletal:  On examination, in moderate distress, walks with an antalgic gait, forward flexed. Mood and affect are appropriate. Straight leg raise, buttock and thigh pain bilaterally. EHL is 4+/5 bilaterally. Quad strength is 5-/5  bilaterally. Limited extension and flexion of the lumbar spine.  Lumbar spine exam reveals no evidence of soft tissue swelling, deformity or skin ecchymosis. On palpation there is no tenderness of the lumbar spine. No flank pain with percussion. The abdomen is soft and nontender. Nontender over the trochanters. No cellulitis or lymphadenopathy.  Motor is 5/5 including tibialis anterior, plantar flexion, and hamstrings. Patient is normoreflexic. There is no Babinski or clonus. Sensory exam is intact to light touch. Patient has good distal pulses. No DVT. No pain and normal range of motion without instability of the hips, knees and ankles.  Neurological: He is alert and oriented to person, place, and time.  Skin: Skin is warm and dry.    Three-view radiographs of the lumbar spine; AP, lateral, flexion and extension demonstrate a minimal listhesis at L4-5. No significant instability in flexion and extension. Multilevel degenerative changes are noted.  MRI of December 8 demonstrates multifactorial stenosis at L2-3, L3-4 and L4-5. Facet arthropathy, epidural lipomatosis.  Assessment/Plan Disabling neurogenic claudication secondary to  spinal stenosis at L2-3, L3-4 and L4-5, multifactorial, epidural lipomatosis, facet arthropathy.  We discussed living with these symptoms with activity modification. I am in agreement that I do not feel that an epidural steroid series would be particularly beneficial to him in the near or the long term. Other option is to decompress those three levels. I had an extensive discussion of the risks and benefits of the lumbar decompression with the patient including bleeding, infection, damage to neurovascular structures, epidural fibrosis, CSF leak requiring repair. We also discussed increase in pain, adjacent segment disease, recurrent disc herniation, need for future surgery including repeat decompression and/or fusion. We also discussed risks of postoperative hematoma,  paralysis, anesthetic complications including DVT, PE, death, cardiopulmonary dysfunction. In addition, the perioperative and postoperative courses were discussed in detail including the rehabilitative time and return to functional activity and work. I provided the patient with an illustrated handout and utilized the appropriate surgical models. We discussed lateral mass fusion of L4-5 just to lay the native bone in the lateral gutter and possibly augment it with allograft. Given this light minimal listhesis at that point in time, it would not require instrumentation. We discussed specifically the risk of CSF leakage, perioperative concerns. He will require preoperative clearance. He has had bilateral stents placed at some point in time, previously was on Plavix. He discontinue his own aspirin due to GI bleeding. He will require to be off his Plavix five days before and five days after any type of surgical intervention. We will utilize a brace postoperatively. Followup in two weeks postop. We spent up to 45 minutes discussing all these issues. We will proceed with clearance from Dr. Jenny Reichmann as well as from his cardiologist, but certainly he feels that he is disabled by his symptoms and it is reasonable given his underlying pathology and his history.  Plan microlumbar decompression L2-3, L3-4, L4-5, lateral mass fusion L4-5 with local autologous bone graft  Cecilie Kicks., PA-C for Dr. Tonita Cong 10/14/2016, 9:05 AM

## 2016-10-16 MED ORDER — LIALDA 1.2 G PO TBEC: DELAYED_RELEASE_TABLET | ORAL | 0 refills | Status: DC

## 2016-10-16 NOTE — Telephone Encounter (Signed)
[name] (patient's daughter) states that her father decided to just pay for the Lialda out of pocket since [facility] never sent them the info they needed to fax to patient assistance. [name] states she would like me to send a new prescription to [facility] for the brand name Lialda since they are trying to send her father the generic. She states she would prefer for him to get to brand name since he is paying full price for the medication anyway. Prescription for Lialda sent to [facility] Rx dispense as written.

## 2016-10-28 ENCOUNTER — Other Ambulatory Visit (HOSPITAL_COMMUNITY): Payer: Self-pay | Admitting: *Deleted

## 2016-10-28 NOTE — Progress Notes (Signed)
STRESS TEST WITH RESTING EKG 09-30-16 EPIC CARDIAC CLEARANCE TELEPHONE NOTE DR Gwenlyn Found 09-10-16 EPIC

## 2016-10-28 NOTE — Patient Instructions (Signed)
RAYNER ERMAN  10/28/2016   Your procedure is scheduled on: 11-05-16  Report to [facility]  Entrance take [facility]  elevators to 3rd floor to  [place] at  530 AM.  Call this number if you have problems the morning of surgery [phone]   Remember: ONLY 1 PERSON MAY GO WITH YOU TO SHORT STAY TO GET  READY MORNING OF [place].  Do not eat food or drink liquids :After Midnight.     Take these medicines the morning of surgery with A SIP OF WATER: ZETIA, PANTAPRAZOLE (Green)              You may not have any metal on your body including hair pins and              piercings  Do not wear jewelry, make-up, lotions, powders or perfumes, deodorant             Do not wear nail polish.  Do not shave  48 hours prior to surgery.              Men may shave face and neck.   Do not bring valuables to the hospital. [place].  Contacts, dentures or bridgework may not be worn into surgery.  Leave suitcase in the car. After surgery it may be brought to your room.                  Please read over the following fact sheets you were given: _____________________________________________________________________             [facility] - Preparing for Surgery Before surgery, you can play an important role.  Because skin is not sterile, your skin needs to be as free of germs as possible.  You can reduce the number of germs on your skin by washing with CHG (chlorahexidine gluconate) soap before surgery.  CHG is an antiseptic cleaner which kills germs and bonds with the skin to continue killing germs even after washing. Please DO NOT use if you have an allergy to CHG or antibacterial soaps.  If your skin becomes reddened/irritated stop using the CHG and inform your nurse when you arrive at Short Stay. Do not shave (including legs and underarms) for at least 48 hours prior to the first CHG shower.  You may shave your  face/neck. Please follow these instructions carefully:  1.  Shower with CHG Soap the night before surgery and the  morning of Surgery.  2.  If you choose to wash your hair, wash your hair first as usual with your  normal  shampoo.  3.  After you shampoo, rinse your hair and body thoroughly to remove the  shampoo.                           4.  Use CHG as you would any other liquid soap.  You can apply chg directly  to the skin and wash                       Gently with a scrungie or clean washcloth.  5.  Apply the CHG Soap to your body ONLY FROM THE NECK DOWN.   Do not use on face/  open                           Wound or open sores. Avoid contact with eyes, ears mouth and genitals (private parts).                       Wash face,  Genitals (private parts) with your normal soap.             6.  Wash thoroughly, paying special attention to the area where your surgery  will be performed.  7.  Thoroughly rinse your body with warm water from the neck down.  8.  DO NOT shower/wash with your normal soap after using and rinsing off  the CHG Soap.                9.  Pat yourself dry with a clean towel.            10.  Wear clean pajamas.            11.  Place clean sheets on your bed the night of your first shower and do not  sleep with pets. Day of Surgery : Do not apply any lotions/deodorants the morning of surgery.  Please wear clean clothes to the hospital/surgery center.  FAILURE TO FOLLOW THESE INSTRUCTIONS MAY RESULT IN THE CANCELLATION OF YOUR SURGERY PATIENT SIGNATURE_________________________________  NURSE SIGNATURE__________________________________  ________________________________________________________________________   Adam Phenix  An incentive spirometer is a tool that can help keep your lungs clear and active. This tool measures how well you are filling your lungs with each breath. Taking long deep breaths may help reverse or decrease the chance of developing breathing  (pulmonary) problems (especially infection) following:  A long period of time when you are unable to move or be active. BEFORE THE PROCEDURE   If the spirometer includes an indicator to show your best effort, your nurse or respiratory therapist will set it to a desired goal.  If possible, sit up straight or lean slightly forward. Try not to slouch.  Hold the incentive spirometer in an upright position. INSTRUCTIONS FOR USE  1. Sit on the edge of your bed if possible, or sit up as far as you can in bed or on a chair. 2. Hold the incentive spirometer in an upright position. 3. Breathe out normally. 4. Place the mouthpiece in your mouth and seal your lips tightly around it. 5. Breathe in slowly and as deeply as possible, raising the piston or the ball toward the top of the column. 6. Hold your breath for 3-5 seconds or for as long as possible. Allow the piston or ball to fall to the bottom of the column. 7. Remove the mouthpiece from your mouth and breathe out normally. 8. Rest for a few seconds and repeat Steps 1 through 7 at least 10 times every 1-2 hours when you are awake. Take your time and take a few normal breaths between deep breaths. 9. The spirometer may include an indicator to show your best effort. Use the indicator as a goal to work toward during each repetition. 10. After each set of 10 deep breaths, practice coughing to be sure your lungs are clear. If you have an incision (the cut made at the time of surgery), support your incision when coughing by placing a pillow or rolled up towels firmly against it. Once you are able to get out of bed, walk around indoors and  cough well. You may stop using the incentive spirometer when instructed by your caregiver.  RISKS AND COMPLICATIONS  Take your time so you do not get dizzy or light-headed.  If you are in pain, you may need to take or ask for pain medication before doing incentive spirometry. It is harder to take a deep breath if you  are having pain. AFTER USE  Rest and breathe slowly and easily.  It can be helpful to keep track of a log of your progress. Your caregiver can provide you with a simple table to help with this. If you are using the spirometer at home, follow these instructions: [place] IF:   You are having difficultly using the spirometer.  You have trouble using the spirometer as often as instructed.  Your pain medication is not giving enough relief while using the spirometer.  You develop fever of 100.5 F (38.1 C) or higher. SEEK IMMEDIATE MEDICAL CARE IF:   You cough up bloody sputum that had not been present before.  You develop fever of 102 F (38.9 C) or greater.  You develop worsening pain at or near the incision site. MAKE SURE YOU:   Understand these instructions.  Will watch your condition.  Will get help right away if you are not doing well or get worse. Document Released: 01/05/2007 Document Revised: 11/17/2011 Document Reviewed: 03/08/2007 [facility] Patient Information 2014 [place], [place].   ________________________________________________________________________

## 2016-10-29 ENCOUNTER — Encounter (HOSPITAL_COMMUNITY)
Admission: RE | Admit: 2016-10-29 | Discharge: 2016-10-29 | Disposition: A | Discharge status: Home / Self Care | Payer: Medicare Other | Source: Ambulatory Visit | Attending: Specialist | Admitting: Specialist

## 2016-10-29 ENCOUNTER — Encounter (HOSPITAL_COMMUNITY): Payer: Self-pay

## 2016-10-29 ENCOUNTER — Ambulatory Visit (HOSPITAL_COMMUNITY)
Admission: RE | Admit: 2016-10-29 | Discharge: 2016-10-29 | Disposition: A | Discharge status: Home / Self Care | Payer: Medicare Other | Source: Ambulatory Visit | Attending: Orthopedic Surgery | Admitting: Orthopedic Surgery

## 2016-10-29 DIAGNOSIS — I700 Atherosclerosis of aorta: Secondary | ICD-10-CM | POA: Diagnosis not present

## 2016-10-29 DIAGNOSIS — Z01818 Encounter for other preprocedural examination: Secondary | ICD-10-CM | POA: Insufficient documentation

## 2016-10-29 DIAGNOSIS — M5126 Other intervertebral disc displacement, lumbar region: Secondary | ICD-10-CM

## 2016-10-29 DIAGNOSIS — M5136 Other intervertebral disc degeneration, lumbar region: Secondary | ICD-10-CM | POA: Insufficient documentation

## 2016-10-29 DIAGNOSIS — Z01812 Encounter for preprocedural laboratory examination: Secondary | ICD-10-CM | POA: Insufficient documentation

## 2016-10-29 HISTORY — DX: Spinal stenosis, lumbar region without neurogenic claudication: M48.061

## 2016-10-29 LAB — BASIC METABOLIC PANEL
ANION GAP: 5 (ref 5–15)
BUN: 12 mg/dL (ref 6–20)
CO2: 30 mmol/L (ref 22–32)
CREATININE: 1.14 mg/dL (ref 0.61–1.24)
Calcium: 9.1 mg/dL (ref 8.9–10.3)
Chloride: 103 mmol/L (ref 101–111)
GLUCOSE: 118 mg/dL — AB (ref 65–99)
Potassium: 4.7 mmol/L (ref 3.5–5.1)
Sodium: 138 mmol/L (ref 135–145)

## 2016-10-29 LAB — SURGICAL PCR SCREEN
MRSA, PCR: NEGATIVE
STAPHYLOCOCCUS AUREUS: NEGATIVE

## 2016-10-29 LAB — CBC
HCT: 45.9 % (ref 39.0–52.0)
Hemoglobin: 15.4 g/dL (ref 13.0–17.0)
MCH: 29.3 pg (ref 26.0–34.0)
MCHC: 33.6 g/dL (ref 30.0–36.0)
MCV: 87.3 fL (ref 78.0–100.0)
PLATELETS: 156 10*3/uL (ref 150–400)
RBC: 5.26 MIL/uL (ref 4.22–5.81)
RDW: 14.7 % (ref 11.5–15.5)
WBC: 6.4 10*3/uL (ref 4.0–10.5)

## 2016-11-03 ENCOUNTER — Other Ambulatory Visit: Payer: Self-pay | Admitting: Internal Medicine

## 2016-11-04 NOTE — Telephone Encounter (Signed)
Done hardcopy to [employer]

## 2016-11-05 ENCOUNTER — Encounter (HOSPITAL_COMMUNITY)
Admission: RE | Disposition: A | Discharge status: Home / Self Care | Payer: Self-pay | Source: Ambulatory Visit | Attending: Specialist

## 2016-11-05 ENCOUNTER — Observation Stay (HOSPITAL_COMMUNITY)
Admission: RE | Admit: 2016-11-05 | Discharge: 2016-11-06 | Disposition: A | Discharge status: Home / Self Care | Payer: Medicare Other | Source: Ambulatory Visit | Attending: Specialist | Admitting: Specialist

## 2016-11-05 ENCOUNTER — Ambulatory Visit (HOSPITAL_COMMUNITY): Payer: Medicare Other

## 2016-11-05 ENCOUNTER — Ambulatory Visit (HOSPITAL_COMMUNITY): Payer: Medicare Other | Admitting: Anesthesiology

## 2016-11-05 ENCOUNTER — Encounter (HOSPITAL_COMMUNITY): Payer: Self-pay | Admitting: *Deleted

## 2016-11-05 DIAGNOSIS — Z95820 Peripheral vascular angioplasty status with implants and grafts: Secondary | ICD-10-CM | POA: Insufficient documentation

## 2016-11-05 DIAGNOSIS — Z800 Family history of malignant neoplasm of digestive organs: Secondary | ICD-10-CM | POA: Diagnosis not present

## 2016-11-05 DIAGNOSIS — E538 Deficiency of other specified B group vitamins: Secondary | ICD-10-CM | POA: Insufficient documentation

## 2016-11-05 DIAGNOSIS — M48061 Spinal stenosis, lumbar region without neurogenic claudication: Secondary | ICD-10-CM | POA: Diagnosis not present

## 2016-11-05 DIAGNOSIS — Z9889 Other specified postprocedural states: Secondary | ICD-10-CM | POA: Insufficient documentation

## 2016-11-05 DIAGNOSIS — K219 Gastro-esophageal reflux disease without esophagitis: Secondary | ICD-10-CM | POA: Insufficient documentation

## 2016-11-05 DIAGNOSIS — Z9842 Cataract extraction status, left eye: Secondary | ICD-10-CM | POA: Diagnosis not present

## 2016-11-05 DIAGNOSIS — E7439 Other disorders of intestinal carbohydrate absorption: Secondary | ICD-10-CM | POA: Insufficient documentation

## 2016-11-05 DIAGNOSIS — I700 Atherosclerosis of aorta: Secondary | ICD-10-CM | POA: Insufficient documentation

## 2016-11-05 DIAGNOSIS — Z79899 Other long term (current) drug therapy: Secondary | ICD-10-CM | POA: Insufficient documentation

## 2016-11-05 DIAGNOSIS — Z9841 Cataract extraction status, right eye: Secondary | ICD-10-CM | POA: Insufficient documentation

## 2016-11-05 DIAGNOSIS — G4700 Insomnia, unspecified: Secondary | ICD-10-CM | POA: Diagnosis not present

## 2016-11-05 DIAGNOSIS — I739 Peripheral vascular disease, unspecified: Secondary | ICD-10-CM | POA: Insufficient documentation

## 2016-11-05 DIAGNOSIS — Z419 Encounter for procedure for purposes other than remedying health state, unspecified: Secondary | ICD-10-CM

## 2016-11-05 DIAGNOSIS — Z888 Allergy status to other drugs, medicaments and biological substances status: Secondary | ICD-10-CM | POA: Insufficient documentation

## 2016-11-05 DIAGNOSIS — D179 Benign lipomatous neoplasm, unspecified: Secondary | ICD-10-CM | POA: Diagnosis not present

## 2016-11-05 DIAGNOSIS — Z791 Long term (current) use of non-steroidal anti-inflammatories (NSAID): Secondary | ICD-10-CM | POA: Insufficient documentation

## 2016-11-05 DIAGNOSIS — Z8249 Family history of ischemic heart disease and other diseases of the circulatory system: Secondary | ICD-10-CM | POA: Insufficient documentation

## 2016-11-05 DIAGNOSIS — I6522 Occlusion and stenosis of left carotid artery: Secondary | ICD-10-CM | POA: Insufficient documentation

## 2016-11-05 DIAGNOSIS — Z91041 Radiographic dye allergy status: Secondary | ICD-10-CM | POA: Diagnosis not present

## 2016-11-05 DIAGNOSIS — Z87442 Personal history of urinary calculi: Secondary | ICD-10-CM | POA: Insufficient documentation

## 2016-11-05 DIAGNOSIS — Z825 Family history of asthma and other chronic lower respiratory diseases: Secondary | ICD-10-CM | POA: Diagnosis not present

## 2016-11-05 DIAGNOSIS — Z87891 Personal history of nicotine dependence: Secondary | ICD-10-CM | POA: Insufficient documentation

## 2016-11-05 DIAGNOSIS — M5136 Other intervertebral disc degeneration, lumbar region: Secondary | ICD-10-CM | POA: Diagnosis not present

## 2016-11-05 DIAGNOSIS — M4306 Spondylolysis, lumbar region: Secondary | ICD-10-CM | POA: Diagnosis not present

## 2016-11-05 DIAGNOSIS — E882 Lipomatosis, not elsewhere classified: Secondary | ICD-10-CM | POA: Diagnosis not present

## 2016-11-05 DIAGNOSIS — M48062 Spinal stenosis, lumbar region with neurogenic claudication: Secondary | ICD-10-CM | POA: Diagnosis not present

## 2016-11-05 DIAGNOSIS — I10 Essential (primary) hypertension: Secondary | ICD-10-CM | POA: Insufficient documentation

## 2016-11-05 DIAGNOSIS — G8929 Other chronic pain: Secondary | ICD-10-CM | POA: Diagnosis not present

## 2016-11-05 DIAGNOSIS — M4316 Spondylolisthesis, lumbar region: Secondary | ICD-10-CM | POA: Diagnosis not present

## 2016-11-05 DIAGNOSIS — K2270 Barrett's esophagus without dysplasia: Secondary | ICD-10-CM | POA: Insufficient documentation

## 2016-11-05 DIAGNOSIS — Z7902 Long term (current) use of antithrombotics/antiplatelets: Secondary | ICD-10-CM | POA: Insufficient documentation

## 2016-11-05 DIAGNOSIS — Z85828 Personal history of other malignant neoplasm of skin: Secondary | ICD-10-CM | POA: Insufficient documentation

## 2016-11-05 DIAGNOSIS — E785 Hyperlipidemia, unspecified: Secondary | ICD-10-CM | POA: Insufficient documentation

## 2016-11-05 HISTORY — PX: LUMBAR LAMINECTOMY/DECOMPRESSION MICRODISCECTOMY: SHX5026

## 2016-11-05 SURGERY — LUMBAR LAMINECTOMY/DECOMPRESSION MICRODISCECTOMY 3 LEVELS
Anesthesia: General | Site: Back

## 2016-11-05 MED ORDER — EPHEDRINE 5 MG/ML INJ
INTRAVENOUS | Status: AC
  Filled 2016-11-05: qty 10

## 2016-11-05 MED ORDER — PANTOPRAZOLE SODIUM 40 MG PO TBEC
40.0000 mg | DELAYED_RELEASE_TABLET | Freq: Every day | ORAL | Status: DC
  Administered 2016-11-06: 40 mg via ORAL
  Filled 2016-11-05: qty 1

## 2016-11-05 MED ORDER — DEXTROSE 5 % IV SOLN
500.0000 mg | Freq: Four times a day (QID) | INTRAVENOUS | Status: DC | PRN
  Administered 2016-11-05: 500 mg via INTRAVENOUS
  Filled 2016-11-05: qty 550
  Filled 2016-11-05: qty 5

## 2016-11-05 MED ORDER — ROCURONIUM BROMIDE 10 MG/ML (PF) SYRINGE
PREFILLED_SYRINGE | INTRAVENOUS | Status: DC | PRN
  Administered 2016-11-05: 50 mg via INTRAVENOUS
  Administered 2016-11-05 (×2): 10 mg via INTRAVENOUS

## 2016-11-05 MED ORDER — EPHEDRINE SULFATE-NACL 50-0.9 MG/10ML-% IV SOSY
PREFILLED_SYRINGE | INTRAVENOUS | Status: DC | PRN
  Administered 2016-11-05 (×5): 5 mg via INTRAVENOUS

## 2016-11-05 MED ORDER — MESALAMINE 1.2 G PO TBEC
2.4000 g | DELAYED_RELEASE_TABLET | Freq: Every day | ORAL | Status: DC
  Administered 2016-11-06: 07:00:00 2.4 g via ORAL
  Filled 2016-11-05: qty 2

## 2016-11-05 MED ORDER — CEFAZOLIN SODIUM-DEXTROSE 2-4 GM/100ML-% IV SOLN
INTRAVENOUS | Status: AC
  Filled 2016-11-05: qty 100

## 2016-11-05 MED ORDER — ACETAMINOPHEN 325 MG PO TABS: 650.0000 mg | ORAL_TABLET | ORAL | Status: DC | PRN

## 2016-11-05 MED ORDER — ONDANSETRON HCL 4 MG/2ML IJ SOLN
INTRAMUSCULAR | Status: DC | PRN
  Administered 2016-11-05: 4 mg via INTRAVENOUS

## 2016-11-05 MED ORDER — MENTHOL 3 MG MT LOZG: 1.0000 | LOZENGE | OROMUCOSAL | Status: DC | PRN

## 2016-11-05 MED ORDER — MAGNESIUM CITRATE PO SOLN: 1.0000 | Freq: Once | ORAL | Status: DC | PRN

## 2016-11-05 MED ORDER — RISAQUAD PO CAPS
1.0000 | ORAL_CAPSULE | Freq: Every day | ORAL | Status: DC
  Administered 2016-11-06: 1 via ORAL
  Filled 2016-11-05: qty 1

## 2016-11-05 MED ORDER — LIDOCAINE 2% (20 MG/ML) 5 ML SYRINGE
INTRAMUSCULAR | Status: AC
  Filled 2016-11-05: qty 5

## 2016-11-05 MED ORDER — ZOLPIDEM TARTRATE 5 MG PO TABS: 5.0000 mg | ORAL_TABLET | Freq: Every evening | ORAL | Status: DC | PRN

## 2016-11-05 MED ORDER — LISINOPRIL 20 MG PO TABS
20.0000 mg | ORAL_TABLET | Freq: Every day | ORAL | Status: DC
  Administered 2016-11-06: 20 mg via ORAL
  Filled 2016-11-05: qty 1

## 2016-11-05 MED ORDER — PHENOL 1.4 % MT LIQD: 1.0000 | OROMUCOSAL | Status: DC | PRN

## 2016-11-05 MED ORDER — SODIUM CHLORIDE 0.9 % IR SOLN
Status: AC
  Filled 2016-11-05: qty 500000

## 2016-11-05 MED ORDER — METHOCARBAMOL 500 MG PO TABS: 500.0000 mg | ORAL_TABLET | Freq: Four times a day (QID) | ORAL | Status: DC | PRN

## 2016-11-05 MED ORDER — SUFENTANIL CITRATE 50 MCG/ML IV SOLN
INTRAVENOUS | Status: AC
  Filled 2016-11-05: qty 1

## 2016-11-05 MED ORDER — SODIUM CHLORIDE 0.9 % IR SOLN
Status: DC | PRN
  Administered 2016-11-05: 500 mL

## 2016-11-05 MED ORDER — ROCURONIUM BROMIDE 50 MG/5ML IV SOSY
PREFILLED_SYRINGE | INTRAVENOUS | Status: AC
  Filled 2016-11-05: qty 5

## 2016-11-05 MED ORDER — POLYETHYLENE GLYCOL 3350 17 G PO PACK: 17.0000 g | PACK | Freq: Every day | ORAL | Status: DC | PRN

## 2016-11-05 MED ORDER — DEXAMETHASONE SODIUM PHOSPHATE 10 MG/ML IJ SOLN
INTRAMUSCULAR | Status: DC | PRN
  Administered 2016-11-05: 10 mg via INTRAVENOUS

## 2016-11-05 MED ORDER — LISINOPRIL-HYDROCHLOROTHIAZIDE 20-25 MG PO TABS: 1.0000 | ORAL_TABLET | Freq: Every day | ORAL | Status: DC

## 2016-11-05 MED ORDER — HYDROMORPHONE HCL 1 MG/ML IJ SOLN
INTRAMUSCULAR | Status: AC
  Administered 2016-11-05: 0.5 mg via INTRAVENOUS
  Filled 2016-11-05: qty 1

## 2016-11-05 MED ORDER — SUGAMMADEX SODIUM 500 MG/5ML IV SOLN
INTRAVENOUS | Status: DC | PRN
  Administered 2016-11-05: 300 mg via INTRAVENOUS

## 2016-11-05 MED ORDER — CEFAZOLIN SODIUM-DEXTROSE 2-4 GM/100ML-% IV SOLN
2.0000 g | Freq: Three times a day (TID) | INTRAVENOUS | Status: AC
  Administered 2016-11-05 – 2016-11-06 (×3): 2 g via INTRAVENOUS
  Filled 2016-11-05 (×3): qty 100

## 2016-11-05 MED ORDER — LIDOCAINE 2% (20 MG/ML) 5 ML SYRINGE
INTRAMUSCULAR | Status: DC | PRN
  Administered 2016-11-05: 100 mg via INTRAVENOUS

## 2016-11-05 MED ORDER — HYDROMORPHONE HCL 1 MG/ML IJ SOLN
0.2500 mg | INTRAMUSCULAR | Status: DC | PRN
  Administered 2016-11-05 (×2): 0.5 mg via INTRAVENOUS

## 2016-11-05 MED ORDER — ONDANSETRON HCL 4 MG/2ML IJ SOLN
4.0000 mg | INTRAMUSCULAR | Status: DC | PRN
  Administered 2016-11-06 (×2): 4 mg via INTRAVENOUS
  Filled 2016-11-05 (×2): qty 2

## 2016-11-05 MED ORDER — BISACODYL 5 MG PO TBEC: 5.0000 mg | DELAYED_RELEASE_TABLET | Freq: Every day | ORAL | Status: DC | PRN

## 2016-11-05 MED ORDER — ACETAMINOPHEN 650 MG RE SUPP: 650.0000 mg | RECTAL | Status: DC | PRN

## 2016-11-05 MED ORDER — CEFAZOLIN SODIUM-DEXTROSE 2-3 GM-% IV SOLR
INTRAVENOUS | Status: DC | PRN
  Administered 2016-11-05: 2 g via INTRAVENOUS

## 2016-11-05 MED ORDER — HYDROCODONE-ACETAMINOPHEN 5-325 MG PO TABS: 1.0000 | ORAL_TABLET | ORAL | 0 refills | Status: DC | PRN

## 2016-11-05 MED ORDER — PROPOFOL 10 MG/ML IV BOLUS
INTRAVENOUS | Status: DC | PRN
  Administered 2016-11-05: 158 mg via INTRAVENOUS

## 2016-11-05 MED ORDER — DOCUSATE SODIUM 100 MG PO CAPS: 100.0000 mg | ORAL_CAPSULE | Freq: Two times a day (BID) | ORAL | 1 refills | Status: DC | PRN

## 2016-11-05 MED ORDER — ONDANSETRON HCL 4 MG/2ML IJ SOLN
INTRAMUSCULAR | Status: AC
  Filled 2016-11-05: qty 2

## 2016-11-05 MED ORDER — KCL IN DEXTROSE-NACL 20-5-0.45 MEQ/L-%-% IV SOLN
INTRAVENOUS | Status: AC
  Administered 2016-11-05 – 2016-11-06 (×2): via INTRAVENOUS
  Filled 2016-11-05 (×2): qty 1000

## 2016-11-05 MED ORDER — THROMBIN 5000 UNITS EX SOLR
CUTANEOUS | Status: AC
  Filled 2016-11-05: qty 10000

## 2016-11-05 MED ORDER — VITAMIN B-12 1000 MCG PO TABS
1000.0000 ug | ORAL_TABLET | Freq: Every day | ORAL | Status: DC
  Administered 2016-11-06: 1000 ug via ORAL
  Filled 2016-11-05: qty 1

## 2016-11-05 MED ORDER — ACETAMINOPHEN 10 MG/ML IV SOLN
1000.0000 mg | INTRAVENOUS | Status: AC
  Administered 2016-11-05: 1000 mg via INTRAVENOUS

## 2016-11-05 MED ORDER — EZETIMIBE 10 MG PO TABS
10.0000 mg | ORAL_TABLET | Freq: Every day | ORAL | Status: DC
  Administered 2016-11-06: 10 mg via ORAL
  Filled 2016-11-05: qty 1

## 2016-11-05 MED ORDER — HYDROCHLOROTHIAZIDE 25 MG PO TABS
25.0000 mg | ORAL_TABLET | Freq: Every day | ORAL | Status: DC
  Administered 2016-11-06: 25 mg via ORAL
  Filled 2016-11-05: qty 1

## 2016-11-05 MED ORDER — THROMBIN 5000 UNITS EX SOLR
CUTANEOUS | Status: DC | PRN
  Administered 2016-11-05: 10000 [IU] via TOPICAL

## 2016-11-05 MED ORDER — ACETAMINOPHEN 10 MG/ML IV SOLN
INTRAVENOUS | Status: AC
  Filled 2016-11-05: qty 100

## 2016-11-05 MED ORDER — DOCUSATE SODIUM 100 MG PO CAPS
100.0000 mg | ORAL_CAPSULE | Freq: Two times a day (BID) | ORAL | Status: DC
  Administered 2016-11-05 – 2016-11-06 (×2): 100 mg via ORAL
  Filled 2016-11-05 (×2): qty 1

## 2016-11-05 MED ORDER — PROMETHAZINE HCL 25 MG/ML IJ SOLN: 6.2500 mg | INTRAMUSCULAR | Status: DC | PRN

## 2016-11-05 MED ORDER — ACETAMINOPHEN 325 MG PO TABS: 650.0000 mg | ORAL_TABLET | Freq: Four times a day (QID) | ORAL | Status: DC | PRN

## 2016-11-05 MED ORDER — HYDROMORPHONE HCL 1 MG/ML IJ SOLN
0.5000 mg | INTRAMUSCULAR | Status: DC | PRN
  Administered 2016-11-05 (×2): 0.5 mg via INTRAVENOUS
  Filled 2016-11-05 (×2): qty 0.5

## 2016-11-05 MED ORDER — ALUM & MAG HYDROXIDE-SIMETH 200-200-20 MG/5ML PO SUSP: 30.0000 mL | Freq: Four times a day (QID) | ORAL | Status: DC | PRN

## 2016-11-05 MED ORDER — DEXAMETHASONE SODIUM PHOSPHATE 10 MG/ML IJ SOLN
INTRAMUSCULAR | Status: AC
  Filled 2016-11-05: qty 1

## 2016-11-05 MED ORDER — ACETAMINOPHEN 650 MG RE SUPP: 650.0000 mg | Freq: Four times a day (QID) | RECTAL | Status: DC | PRN

## 2016-11-05 MED ORDER — LACTATED RINGERS IV SOLN
INTRAVENOUS | Status: DC | PRN
  Administered 2016-11-05 (×2): via INTRAVENOUS

## 2016-11-05 MED ORDER — SUFENTANIL CITRATE 50 MCG/ML IV SOLN
INTRAVENOUS | Status: DC | PRN
  Administered 2016-11-05 (×2): 5 ug via INTRAVENOUS

## 2016-11-05 MED ORDER — BUPIVACAINE-EPINEPHRINE (PF) 0.5% -1:200000 IJ SOLN
INTRAMUSCULAR | Status: DC | PRN
  Administered 2016-11-05: 14 mL

## 2016-11-05 MED ORDER — BUPIVACAINE-EPINEPHRINE (PF) 0.5% -1:200000 IJ SOLN
INTRAMUSCULAR | Status: AC
  Filled 2016-11-05: qty 30

## 2016-11-05 MED ORDER — PROPOFOL 10 MG/ML IV BOLUS
INTRAVENOUS | Status: AC
  Filled 2016-11-05: qty 40

## 2016-11-05 MED ORDER — HYDROCODONE-ACETAMINOPHEN 5-325 MG PO TABS
1.0000 | ORAL_TABLET | ORAL | Status: DC | PRN
  Administered 2016-11-05: 2 via ORAL
  Administered 2016-11-05 (×2): 1 via ORAL
  Administered 2016-11-06 (×3): 2 via ORAL
  Filled 2016-11-05 (×2): qty 2
  Filled 2016-11-05: qty 1
  Filled 2016-11-05 (×2): qty 2
  Filled 2016-11-05: qty 1
  Filled 2016-11-05: qty 2

## 2016-11-05 SURGICAL SUPPLY — 53 items
ADAPTER CBCII HEMOVAC (AUTOTRANSFUSION) ×3 IMPLANT
BAG ZIPLOCK 12X15 (MISCELLANEOUS) IMPLANT
CLEANER TIP ELECTROSURG 2X2 (MISCELLANEOUS) ×3 IMPLANT
CLOSURE WOUND 1/2 X4 (GAUZE/BANDAGES/DRESSINGS) ×1
CLOTH 2% CHLOROHEXIDINE 3PK (PERSONAL CARE ITEMS) ×3 IMPLANT
DRAPE MICROSCOPE LEICA (MISCELLANEOUS) ×3 IMPLANT
DRAPE SHEET LG 3/4 BI-LAMINATE (DRAPES) IMPLANT
DRAPE SURG 17X11 SM STRL (DRAPES) ×3 IMPLANT
DRAPE UTILITY XL STRL (DRAPES) ×3 IMPLANT
DRSG AQUACEL AG ADV 3.5X 4 (GAUZE/BANDAGES/DRESSINGS) IMPLANT
DRSG AQUACEL AG ADV 3.5X 6 (GAUZE/BANDAGES/DRESSINGS) IMPLANT
DURAPREP 26ML APPLICATOR (WOUND CARE) ×3 IMPLANT
DURASEAL SPINE SEALANT 3ML (MISCELLANEOUS) IMPLANT
ELECT BLADE TIP CTD 4 INCH (ELECTRODE) ×3 IMPLANT
ELECT REM PT RETURN 9FT ADLT (ELECTROSURGICAL) ×3
ELECTRODE REM PT RTRN 9FT ADLT (ELECTROSURGICAL) ×1 IMPLANT
GAUZE SPONGE 4X4 12PLY STRL (GAUZE/BANDAGES/DRESSINGS) ×3 IMPLANT
GLOVE BIOGEL PI IND STRL 7.0 (GLOVE) ×1 IMPLANT
GLOVE BIOGEL PI INDICATOR 7.0 (GLOVE) ×2
GLOVE SURG SS PI 7.0 STRL IVOR (GLOVE) ×3 IMPLANT
GLOVE SURG SS PI 8.0 STRL IVOR (GLOVE) ×6 IMPLANT
GOWN STRL REUS W/TWL XL LVL3 (GOWN DISPOSABLE) ×6 IMPLANT
HEMOSTAT SPONGE AVITENE ULTRA (HEMOSTASIS) IMPLANT
IV CATH 14GX2 1/4 (CATHETERS) ×3 IMPLANT
KIT BASIN OR (CUSTOM PROCEDURE TRAY) ×3 IMPLANT
KIT POSITIONING SURG ANDREWS (MISCELLANEOUS) ×3 IMPLANT
MANIFOLD NEPTUNE II (INSTRUMENTS) ×3 IMPLANT
MARKER SKIN DUAL TIP RULER LAB (MISCELLANEOUS) ×3 IMPLANT
NEEDLE SPNL 18GX3.5 QUINCKE PK (NEEDLE) ×6 IMPLANT
PACK LAMINECTOMY ORTHO (CUSTOM PROCEDURE TRAY) ×3 IMPLANT
PAD ABD 8X10 STRL (GAUZE/BANDAGES/DRESSINGS) ×3 IMPLANT
PATTIES SURGICAL .5 X.5 (GAUZE/BANDAGES/DRESSINGS) ×3 IMPLANT
PATTIES SURGICAL .75X.75 (GAUZE/BANDAGES/DRESSINGS) ×3 IMPLANT
PATTIES SURGICAL 1X1 (DISPOSABLE) IMPLANT
RUBBERBAND STERILE (MISCELLANEOUS) ×3 IMPLANT
SPONGE LAP 4X18 X RAY DECT (DISPOSABLE) IMPLANT
SPONGE SURGIFOAM ABS GEL 100 (HEMOSTASIS) ×3 IMPLANT
STAPLER VISISTAT (STAPLE) IMPLANT
STRIP CLOSURE SKIN 1/2X4 (GAUZE/BANDAGES/DRESSINGS) ×2 IMPLANT
SUT NURALON 4 0 TR CR/8 (SUTURE) IMPLANT
SUT PROLENE 3 0 PS 2 (SUTURE) IMPLANT
SUT STRATAFIX 1PDS 45CM VIOLET (SUTURE) ×3 IMPLANT
SUT VIC AB 1 CT1 27 (SUTURE) ×4
SUT VIC AB 1 CT1 27XBRD ANTBC (SUTURE) ×2 IMPLANT
SUT VIC AB 1-0 CT2 27 (SUTURE) IMPLANT
SUT VIC AB 2-0 CT1 27 (SUTURE) ×4
SUT VIC AB 2-0 CT1 TAPERPNT 27 (SUTURE) ×2 IMPLANT
SUT VIC AB 2-0 CT2 27 (SUTURE) IMPLANT
SYR 3ML LL SCALE MARK (SYRINGE) IMPLANT
TAPE CLOTH SURG 6X10 WHT LF (GAUZE/BANDAGES/DRESSINGS) ×3 IMPLANT
TOWEL OR 17X26 10 PK STRL BLUE (TOWEL DISPOSABLE) ×3 IMPLANT
TOWEL OR NON WOVEN STRL DISP B (DISPOSABLE) ×3 IMPLANT
YANKAUER SUCT BULB TIP NO VENT (SUCTIONS) ×3 IMPLANT

## 2016-11-05 NOTE — Anesthesia Procedure Notes (Signed)
Procedure Name: Intubation Date/Time: 11/05/2016 7:42 AM Performed by: Halvor Behrend, Virgel Gess Pre-anesthesia Checklist: Patient identified, Emergency Drugs available, Suction available, Patient being monitored and Timeout performed Patient Re-evaluated:Patient Re-evaluated prior to inductionOxygen Delivery Method: Circle system utilized Preoxygenation: Pre-oxygenation with 100% oxygen Intubation Type: IV induction Ventilation: Oral airway inserted - appropriate to patient size Laryngoscope Size: Mac and 4 Grade View: Grade I Tube type: Oral Tube size: 7.5 mm Number of attempts: 1 Airway Equipment and Method: Stylet Placement Confirmation: ETT inserted through vocal cords under direct vision,  positive ETCO2 and breath sounds checked- equal and bilateral Secured at: 22 cm Tube secured with: Tape Dental Injury: Teeth and Oropharynx as per pre-operative assessment  Comments: Intubated by Jackquline Bosch SRNA

## 2016-11-05 NOTE — Brief Op Note (Signed)
11/05/2016  10:13 AM  PATIENT:  [name] [name]  76 y.o. male  PRE-OPERATIVE DIAGNOSIS:  Stenosis L2-L5  POST-OPERATIVE DIAGNOSIS:  Stenosis L2-L5  PROCEDURE:  Procedure(s): Microlumbar decompression L2-3, L3-4, L4-5, lateral mass fusion L4-5 (N/A)  SURGEON:  Surgeon(s) and Role:    * Susa Day, MD - Primary  PHYSICIAN ASSISTANT:   ASSISTANTS: [place]   ANESTHESIA:   spinal  EBL:  Total I/O In: 1000 [I.V.:1000] Out: 900 [Urine:500; Blood:400]  BLOOD ADMINISTERED:none  DRAINS: none   LOCAL MEDICATIONS USED:  MARCAINE     SPECIMEN:  No Specimen  DISPOSITION OF SPECIMEN:  N/A  COUNTS:  YES  TOURNIQUET:  * No tourniquets in log *  DICTATION: .Other Dictation: Dictation Number  [id number]  PLAN OF CARE: Admit for overnight observation  PATIENT DISPOSITION:  PACU - hemodynamically stable.   Delay start of Pharmacological VTE agent (>24hrs) due to surgical blood loss or risk of bleeding: yes

## 2016-11-05 NOTE — Transfer of Care (Signed)
Immediate Anesthesia Transfer of Care Note  Patient: [name] [name]  Procedure(s) Performed: Procedure(s): Microlumbar decompression L2-3, L3-4, L4-5, lateral mass fusion L4-5 (N/A)  Patient Location: PACU  Anesthesia Type:General  Level of Consciousness:  sedated, patient cooperative and responds to stimulation  Airway & Oxygen Therapy:Patient Spontanous Breathing and Patient connected to face mask oxgen  Post-op Assessment:  Report given to PACU RN and Post -op Vital signs reviewed and stable  Post vital signs:  Reviewed and stable  Last Vitals:  Vitals:   11/05/16 0511  BP: (!) 162/70  Pulse: 84  Resp: 18  Temp: 65.5 C    Complications: No apparent anesthesia complications

## 2016-11-05 NOTE — Anesthesia Postprocedure Evaluation (Addendum)
Anesthesia Post Note  Patient: [name] [name]  Procedure(s) Performed: Procedure(s) (LRB): Microlumbar decompression L2-3, L3-4, L4-5, lateral mass fusion L4-5 (N/A)  Patient location during evaluation: PACU Anesthesia Type: General Level of consciousness: awake and alert Pain management: pain level controlled Vital Signs Assessment: post-procedure vital signs reviewed and stable Respiratory status: spontaneous breathing, nonlabored ventilation, respiratory function stable and patient connected to nasal cannula oxygen Cardiovascular status: blood pressure returned to baseline and stable Postop Assessment: no signs of nausea or vomiting Anesthetic complications: no       Last Vitals:  Vitals:   11/05/16 1100 11/05/16 1115  BP: 134/61 (!) 121/54  Pulse: 69 71  Resp: 10 19  Temp:      Last Pain:  Vitals:   11/05/16 1100  TempSrc:   PainSc: 5                  [name] S

## 2016-11-05 NOTE — H&P (View-Only) (Signed)
[name] [name] is an 76 y.o. male.   Chief Complaint: back and leg pain HPI: The patient is a 76 year old male who presents with back pain. The patient is here today in referral from Dr. Delilah Shan. The patient reports low back symptoms including pain which began year(s) ago without any known injury. Symptoms are reported to be located in the low back and Symptoms include pain, tingling (feet) and weakness (BLE). The pain radiates to the left buttock, left posterior thigh, left posterior lower leg, right buttock, right posterior thigh and right posterior lower leg. The patient describes the pain as aching. The patient describes the severity of their symptoms as moderate to severe. Symptoms are exacerbated by standing (and walking). The patient is not currently being treated for this problem. Prior to being seen today the patient was previously evaluated by Dr. Delilah Shan. Past evaluation has included x-ray of the lumbar spine and MRI of the lumbar spine. Past treatment has included activity modification.  [name] follows up. He is having chronic back pain, mainly into the leg, worse with activity, better with rest. Dr. Delilah Shan had obtained an MRI of the lumbar spine which indicated multilevel spinal stenosis from epidural lipomatosis and degenerative changes at L2-3, L3-4 and L4-5, particularly at L4-5. There is a minimal offset at L4-5.  He reports he cannot walk. He has to sit. His legs give out when he attempts to walk to the mailbox. He does not want injections. He is trying a home exercise program, activity modification. He states this has been worsening over the past 10 years. He would like to discuss surgical options for it.  Past Medical History:  Diagnosis Date  . Abdominal pain, generalized 08/15/2008  . Abdominal pain, unspecified site 10/16/2009  . Anal fissure 04/24/2008  . Arthritis   . B12 deficiency 10/11/2014  . BACK PAIN 01/10/2009  . Barrett's esophagus 1997  . BARRETTS ESOPHAGUS 08/16/2007   . BREAST HYPERTROPHY 02/16/2009  . CARBUNCLE, NECK 02/14/2008  . Cataract   . CHEST PAIN 10/10/2008  . DEPRESSION 10/16/2009  . DIVERTICULOSIS, COLON 06/28/2007  . Full dentures   . GERD 06/28/2007  . GLUCOSE INTOLERANCE 08/15/2008  . Headache(784.0) 11/13/2009  . HERNIA, UMBILICAL W/OBSTRUCTION W/O GANGRENE 06/28/2007  . HIP PAIN, RIGHT 10/16/2009  . HTN (hypertension) 07/12/2012  . HYPERLIPIDEMIA 06/28/2007  . HYPERTENSION 06/28/2007  . Impaired glucose tolerance 04/13/2011  . INSOMNIA-SLEEP DISORDER-UNSPEC 10/10/2008  . LEG PAIN, BILATERAL 02/16/2009  . LOW BACK PAIN 06/28/2007  . MASTITIS 01/10/2009  . NECK PAIN 05/31/2008  . NEPHROLITHIASIS, HX OF 06/28/2007  . PROCTOSIGMOIDITIS, ULCERATIVE 1997  . PVD (peripheral vascular disease) with claudication, lifestyle limiting, ABI rt. 0.67, lt. 0.73 07/12/2012  . S/P angioplasty with stent, after DB athrectomy to Rt. ext. iliac and Rt. SFA 07/12/2012  . Skin cancer    right hand  . Stenosis of left carotid artery, mod to severe by dopplers 09/29/2012  . TESTICULAR PAIN, RIGHT 10/25/2010  . VITAMIN D DEFICIENCY 11/13/2009    Past Surgical History:  Procedure Laterality Date  . anal fissure surgury     pt unsure of this  . ATHERECTOMY  09/28/2012  . ATHERECTOMY N/A 07/12/2012   Procedure: ATHERECTOMY;  Surgeon: [name], MD;  Location: [facility] CATH LAB;  Service: Cardiovascular;  Laterality: N/A;  . ATHERECTOMY N/A 09/28/2012   Procedure: ATHERECTOMY;  Surgeon: [name], MD;  Location: [facility] CATH LAB;  Service: Cardiovascular;  Laterality: N/A;  . bilateral inguinal hernia  with mesh and umbilical hernia repairs  09/2007  . CATARACT EXTRACTION     bilateral  . COLONOSCOPY    . GROIN DISSECTION Right 07/20/2013   Procedure: GROIN EXPLORATION, EXCISIONAL BIOPSY RIGHT INGUINAL LYMPH NODES;  Surgeon: Harl Bowie, MD;  Location: [facility];  Service: General;  Laterality: Right;  . HERNIA REPAIR    . left knee arthroscopy     . PERCUTANEOUS STENT INTERVENTION  09/28/2012   Procedure: PERCUTANEOUS STENT INTERVENTION;  Surgeon: [name], MD;  Location: [facility] CATH LAB;  Service: Cardiovascular;;    Family History  Problem Relation Age of Onset  . COPD Mother   . Cerebral aneurysm Mother   . COPD Father   . Colon cancer Neg Hx   . Esophageal cancer Neg Hx   . Rectal cancer Neg Hx   . Stomach cancer Neg Hx    Social History:  reports that he quit smoking about 44 years ago. His smokeless tobacco use includes Chew. He reports that he drinks about 3.0 oz of alcohol per week . He reports that he does not use drugs.  Allergies:  Allergies  Allergen Reactions  . Contrast Media [Iodinated Diagnostic Agents]     Break out in welts  . Lipitor [Atorvastatin] Other (See Comments)    myalgias  . Lovastatin     Muscle pain  . Statins     Legs pain     (Not in a hospital admission)  No results found for this or any previous visit (from the past 48 hour(s)). No results found.  Review of Systems  Constitutional: Negative.   HENT: Negative.   Eyes: Negative.   Respiratory: Negative.   Cardiovascular: Negative.   Gastrointestinal: Negative.   Genitourinary: Negative.   Musculoskeletal: Positive for back pain.  Skin: Negative.   Neurological: Positive for sensory change and focal weakness.  Psychiatric/Behavioral: Negative.     There were no vitals taken for this visit. Physical Exam  Constitutional: He is oriented to person, place, and time. He appears well-developed.  HENT:  Head: Normocephalic.  Eyes: Pupils are equal, round, and reactive to light.  Neck: Normal range of motion.  Cardiovascular: Normal rate.   Respiratory: Effort normal.  GI: Soft.  Musculoskeletal:  On examination, in moderate distress, walks with an antalgic gait, forward flexed. Mood and affect are appropriate. Straight leg raise, buttock and thigh pain bilaterally. EHL is 4+/5 bilaterally. Quad strength is 5-/5  bilaterally. Limited extension and flexion of the lumbar spine.  Lumbar spine exam reveals no evidence of soft tissue swelling, deformity or skin ecchymosis. On palpation there is no tenderness of the lumbar spine. No flank pain with percussion. The abdomen is soft and nontender. Nontender over the trochanters. No cellulitis or lymphadenopathy.  Motor is 5/5 including tibialis anterior, plantar flexion, and hamstrings. Patient is normoreflexic. There is no Babinski or clonus. Sensory exam is intact to light touch. Patient has good distal pulses. No DVT. No pain and normal range of motion without instability of the hips, knees and ankles.  Neurological: He is alert and oriented to person, place, and time.  Skin: Skin is warm and dry.    Three-view radiographs of the lumbar spine; AP, lateral, flexion and extension demonstrate a minimal listhesis at L4-5. No significant instability in flexion and extension. Multilevel degenerative changes are noted.  MRI of December 8 demonstrates multifactorial stenosis at L2-3, L3-4 and L4-5. Facet arthropathy, epidural lipomatosis.  Assessment/Plan Disabling neurogenic claudication secondary to  spinal stenosis at L2-3, L3-4 and L4-5, multifactorial, epidural lipomatosis, facet arthropathy.  We discussed living with these symptoms with activity modification. I am in agreement that I do not feel that an epidural steroid series would be particularly beneficial to him in the near or the long term. Other option is to decompress those three levels. I had an extensive discussion of the risks and benefits of the lumbar decompression with the patient including bleeding, infection, damage to neurovascular structures, epidural fibrosis, CSF leak requiring repair. We also discussed increase in pain, adjacent segment disease, recurrent disc herniation, need for future surgery including repeat decompression and/or fusion. We also discussed risks of postoperative hematoma,  paralysis, anesthetic complications including DVT, PE, death, cardiopulmonary dysfunction. In addition, the perioperative and postoperative courses were discussed in detail including the rehabilitative time and return to functional activity and work. I provided the patient with an illustrated handout and utilized the appropriate surgical models. We discussed lateral mass fusion of L4-5 just to lay the native bone in the lateral gutter and possibly augment it with allograft. Given this light minimal listhesis at that point in time, it would not require instrumentation. We discussed specifically the risk of CSF leakage, perioperative concerns. He will require preoperative clearance. He has had bilateral stents placed at some point in time, previously was on Plavix. He discontinue his own aspirin due to GI bleeding. He will require to be off his Plavix five days before and five days after any type of surgical intervention. We will utilize a brace postoperatively. Followup in two weeks postop. We spent up to 45 minutes discussing all these issues. We will proceed with clearance from Dr. Jenny Reichmann as well as from his cardiologist, but certainly he feels that he is disabled by his symptoms and it is reasonable given his underlying pathology and his history.  Plan microlumbar decompression L2-3, L3-4, L4-5, lateral mass fusion L4-5 with local autologous bone graft  Cecilie Kicks., PA-C for Dr. Tonita Cong 10/14/2016, 9:05 AM

## 2016-11-05 NOTE — Interval H&P Note (Signed)
History and Physical Interval Note:  11/05/2016 7:19 AM  [name] [name]  has presented today for surgery, with the diagnosis of Stenosis L2-L5  The various methods of treatment have been discussed with the patient and family. After consideration of risks, benefits and other options for treatment, the patient has consented to  Procedure(s): Microlumbar decompression L2-3, L3-4, L4-5, lateral mass fusion L4-5 (N/A) as a surgical intervention .  The patient's history has been reviewed, patient examined, no change in status, stable for surgery.  I have reviewed the patient's chart and labs.  Questions were answered to the patient's satisfaction.     [name]

## 2016-11-05 NOTE — Anesthesia Preprocedure Evaluation (Signed)
Anesthesia Evaluation  Patient identified by MRN, date of birth, ID band Patient awake    Reviewed: Allergy & Precautions, NPO status , Patient's Chart, lab work & pertinent test results  Airway Mallampati: II  TM Distance: >3 FB Neck ROM: Full    Dental no notable dental hx.    Pulmonary neg pulmonary ROS, former smoker,    Pulmonary exam normal breath sounds clear to auscultation       Cardiovascular hypertension, + Peripheral Vascular Disease  Normal cardiovascular exam Rhythm:Regular Rate:Normal     Neuro/Psych negative neurological ROS  negative psych ROS   GI/Hepatic negative GI ROS, Neg liver ROS,   Endo/Other  negative endocrine ROS  Renal/GU negative Renal ROS  negative genitourinary   Musculoskeletal negative musculoskeletal ROS (+)   Abdominal   Peds negative pediatric ROS (+)  Hematology negative hematology ROS (+)   Anesthesia Other Findings   Reproductive/Obstetrics negative OB ROS                             Anesthesia Physical Anesthesia Plan  ASA: III  Anesthesia Plan: General   Post-op Pain Management:    Induction: Intravenous  Airway Management Planned: Oral ETT  Additional Equipment:   Intra-op Plan:   Post-operative Plan: Extubation in OR  Informed Consent: I have reviewed the patients History and Physical, chart, labs and discussed the procedure including the risks, benefits and alternatives for the proposed anesthesia with the patient or authorized representative who has indicated his/her understanding and acceptance.   Dental advisory given  Plan Discussed with: CRNA and Surgeon  Anesthesia Plan Comments:         Anesthesia Quick Evaluation

## 2016-11-05 NOTE — Op Note (Signed)
[name] [name], [name] [name] NO.:  [id number]  MEDICAL RECORD NO.:  [id number]  LOCATION:                                 FACILITY:  PHYSICIAN:  Susa Day, M.D.    DATE OF BIRTH:  [birth year]  DATE OF PROCEDURE:  11/05/2016 DATE OF DISCHARGE:                              OPERATIVE REPORT   PREOPERATIVE DIAGNOSES: 1. Spinal stenosis and epidural lipomatosis L2-L3, L3-L4, L4-L5. 2. Spondylolisthesis L4-L5.  POSTOPERATIVE DIAGNOSES: 1. Spinal stenosis and epidural lipomatosis L2-L3, L3-L4, L4-L5. 2. Spondylolisthesis L4-L5.  PROCEDURE PERFORMED: 1. Microlumbar decompression L2-L3, L3-L4, L4-L5 bilaterally. 2. Gill laminectomies of L3 and L4. 3. Bilateral foraminotomies L3, L4, L5. 4. Lateral mass fusion L4-L5 with autologous bone graft.  ANESTHESIA:  General.  ASSISTANT:  Cleophas Dunker, PA.  HISTORY:  A 76 year old with neurogenic claudication secondary to severe spinal stenosis, multifactorial L4-L5 with associated epidural lipomatosis extending from L2-L3 down to below L4-L5.  Neurogenic claudication with a limited ambulatory range.  He had a fairly well- maintained disk space at L4-L5 with slight listhesis at L4-L5.  It was felt to decompress at L2-L3, L3-L4, and L4-L5 and then autologous bone into the lateral recesses at L4-L5, but had some feature of stability given the disk space height with a slight offset.  Risks and benefits discussed including bleeding, infection, damage to the neurovascular structures, no change in symptoms or worsening symptoms, DVT, PE, anesthetic complications, etc.  TECHNIQUE:  With the patient in supine position, after induction of adequate general anesthesia, 2 g Kefzol, placed prone on the [place] frame.  All bony prominences were well padded.  Foley to gravity.  Belly free, abdominal areas free.  Lumbar region was prepped and draped in the usual sterile fashion.  Three 18-gauge spinal needles were utilized  to localize L2 to below L5.  Incision was made from the spinous process of L2 to below L5.  Subcutaneous tissue was dissected.  Electrocautery was utilized to achieve hemostasis.  A 0.25% Marcaine with epinephrine was infiltrated in the perimuscular tissues.  Divided the dorsolumbar fascia in line with the skin incision.  Elevated the paraspinous musculature of L2-L3, L3-L4, and L4-L5.  McCullough retractor was then placed. Confirmatory radiograph obtained.  Operating microscope was draped and brought on the surgical field.  Next, we skeletonized the spinous processes of L3, L4 and L5.  Following this, these were removed with a Leksell rongeur, morselized for future bone graft.  It was soaked in evacuate blood.  Attention was then turned 1st towards the space at L4-L5.  Used a micro curette to detach ligamentum flavum from the cephalad edge of L5 and from beneath the caudad edge of L4.  I then used a 2 mm Kerrison centrally and laterally to remove the neural arch of L4.  Then removed ligamentum flavum from the interspace with a Woodson retractor protecting the thecal sac at all times.  There was severe ligamentum flavum hypertrophy and facet hypertrophy bilaterally here.  I decompressed the lateral recesses to the medial border of pedicle bilaterally, performed partial and medial hemi facetectomies.  Epidural lipomatosis was noted as well.  I performed foraminotomies of L4 and L5 and hemilaminotomies  of L5 bilaterally.  We performed a Gill laminectomy of L4, complete removal of the neural arch.  We then in a similar fashion, detached ligamentum flavum from the caudad edge of L3 and the cephalad edge of L4.  Patties beneath the ligament.  The neural arch of L3 was removed as well.  We performed a Gill laminectomy of L3, decompressed lateral recess to the medial border of pedicle bilaterally.  There was severe lateral recess stenosis secondary to ligamentum flavum facet hypertrophy, but  extensive epidural lipomatosis.  Portion of the lipoma was excised.  We continued cephalad, removed ligamentum from the interspace at L2-L3 as well. Again, exuding and removing the epidural lipomatosis.  Bone wax was placed on the cancellous surfaces.  We used bipolar cautery, utilized to achieve hemostasis and thrombin-soaked Gelfoam.  We obtained confirmatory radiograph with a Woodson in the foramen of L2 and below L5.  Good restoration of thecal sac was noted.  No CSF leakage or active bleeding.  We copiously irrigated with antibiotic irrigation.  We then morselized our autograft from the spinous processes and curetted the lateral aspect of the sets at L4-L5 of the pars and then deposited bone graft in the lateral gutter and impacted it digitally palpating the spinous processes of L4 and L5 bilaterally.  Following this, inspection revealed there was no evidence of active bleeding or CSF leakage.  Any free cancellous bone fragments were removed.  Again copiously irrigated the wounds.  We removed the retractors.  Again paraspinous muscles inspected, no evidence of active bleeding.  Placed a Hemovac and brought out through a stab wound in the fascia and carefully reapproximated dorsolumbar fascia with 1 Vicryl and then oversewn with the Stratafix again taking care not to incarcerate the Hemovac.  Copiously irrigated the subcutaneous tissue, 2-0 Vicryl, staples, wound was dressed sterilely.  He was then placed supine on the hospital bed, extubated without difficulty, and transported to the recovery room in satisfactory condition.  The patient tolerated the procedure well.  No complications.  Blood loss 400 mL.     Susa Day, M.D.   ______________________________ Susa Day, M.D.    Geralynn Rile  D:  11/05/2016  T:  11/05/2016  Job:  [id number]

## 2016-11-05 NOTE — Op Note (Deleted)
  The note originally documented on this encounter has been moved the the encounter in which it belongs.  

## 2016-11-05 NOTE — Telephone Encounter (Signed)
Faxed script back to [facility]...Johny Chess

## 2016-11-06 DIAGNOSIS — Z87442 Personal history of urinary calculi: Secondary | ICD-10-CM | POA: Diagnosis not present

## 2016-11-06 DIAGNOSIS — M5136 Other intervertebral disc degeneration, lumbar region: Secondary | ICD-10-CM | POA: Diagnosis not present

## 2016-11-06 DIAGNOSIS — E538 Deficiency of other specified B group vitamins: Secondary | ICD-10-CM | POA: Diagnosis not present

## 2016-11-06 DIAGNOSIS — E785 Hyperlipidemia, unspecified: Secondary | ICD-10-CM | POA: Diagnosis not present

## 2016-11-06 DIAGNOSIS — Z9842 Cataract extraction status, left eye: Secondary | ICD-10-CM | POA: Diagnosis not present

## 2016-11-06 DIAGNOSIS — Z95820 Peripheral vascular angioplasty status with implants and grafts: Secondary | ICD-10-CM | POA: Diagnosis not present

## 2016-11-06 DIAGNOSIS — Z9841 Cataract extraction status, right eye: Secondary | ICD-10-CM | POA: Diagnosis not present

## 2016-11-06 DIAGNOSIS — Z791 Long term (current) use of non-steroidal anti-inflammatories (NSAID): Secondary | ICD-10-CM | POA: Diagnosis not present

## 2016-11-06 DIAGNOSIS — E882 Lipomatosis, not elsewhere classified: Secondary | ICD-10-CM | POA: Diagnosis not present

## 2016-11-06 DIAGNOSIS — M48062 Spinal stenosis, lumbar region with neurogenic claudication: Secondary | ICD-10-CM | POA: Diagnosis not present

## 2016-11-06 DIAGNOSIS — Z79899 Other long term (current) drug therapy: Secondary | ICD-10-CM | POA: Diagnosis not present

## 2016-11-06 DIAGNOSIS — G4700 Insomnia, unspecified: Secondary | ICD-10-CM | POA: Diagnosis not present

## 2016-11-06 DIAGNOSIS — Z800 Family history of malignant neoplasm of digestive organs: Secondary | ICD-10-CM | POA: Diagnosis not present

## 2016-11-06 DIAGNOSIS — Z91041 Radiographic dye allergy status: Secondary | ICD-10-CM | POA: Diagnosis not present

## 2016-11-06 DIAGNOSIS — Z825 Family history of asthma and other chronic lower respiratory diseases: Secondary | ICD-10-CM | POA: Diagnosis not present

## 2016-11-06 DIAGNOSIS — K219 Gastro-esophageal reflux disease without esophagitis: Secondary | ICD-10-CM | POA: Diagnosis not present

## 2016-11-06 DIAGNOSIS — Z9889 Other specified postprocedural states: Secondary | ICD-10-CM | POA: Diagnosis not present

## 2016-11-06 DIAGNOSIS — I10 Essential (primary) hypertension: Secondary | ICD-10-CM | POA: Diagnosis not present

## 2016-11-06 DIAGNOSIS — E7439 Other disorders of intestinal carbohydrate absorption: Secondary | ICD-10-CM | POA: Diagnosis not present

## 2016-11-06 DIAGNOSIS — G8929 Other chronic pain: Secondary | ICD-10-CM | POA: Diagnosis not present

## 2016-11-06 DIAGNOSIS — Z888 Allergy status to other drugs, medicaments and biological substances status: Secondary | ICD-10-CM | POA: Diagnosis not present

## 2016-11-06 DIAGNOSIS — Z7902 Long term (current) use of antithrombotics/antiplatelets: Secondary | ICD-10-CM | POA: Diagnosis not present

## 2016-11-06 DIAGNOSIS — I700 Atherosclerosis of aorta: Secondary | ICD-10-CM | POA: Diagnosis not present

## 2016-11-06 DIAGNOSIS — M4316 Spondylolisthesis, lumbar region: Secondary | ICD-10-CM | POA: Diagnosis not present

## 2016-11-06 LAB — BASIC METABOLIC PANEL
ANION GAP: 9 (ref 5–15)
BUN: 15 mg/dL (ref 6–20)
CALCIUM: 8.6 mg/dL — AB (ref 8.9–10.3)
CO2: 26 mmol/L (ref 22–32)
Chloride: 101 mmol/L (ref 101–111)
Creatinine, Ser: 1.17 mg/dL (ref 0.61–1.24)
GFR calc Af Amer: >60 mL/min (ref >60)
GFR, EST NON AFRICAN AMERICAN: 59 mL/min — AB (ref >60)
GLUCOSE: 121 mg/dL — AB (ref 65–99)
Potassium: 4.2 mmol/L (ref 3.5–5.1)
SODIUM: 136 mmol/L (ref 135–145)

## 2016-11-06 LAB — CBC
HCT: 39.7 % (ref 39.0–52.0)
Hemoglobin: 13.1 g/dL (ref 13.0–17.0)
MCH: 28.5 pg (ref 26.0–34.0)
MCHC: 33 g/dL (ref 30.0–36.0)
MCV: 86.5 fL (ref 78.0–100.0)
PLATELETS: 192 10*3/uL (ref 150–400)
RBC: 4.59 MIL/uL (ref 4.22–5.81)
RDW: 14.8 % (ref 11.5–15.5)
WBC: 14.6 10*3/uL — AB (ref 4.0–10.5)

## 2016-11-06 MED ORDER — ASPIRIN 81 MG PO TBEC: 81.0000 mg | DELAYED_RELEASE_TABLET | Freq: Every day | ORAL | 12 refills | Status: DC

## 2016-11-06 MED ORDER — LIALDA 1.2 G PO TBEC: 2.4000 g | DELAYED_RELEASE_TABLET | Freq: Every day | ORAL | Status: DC

## 2016-11-06 MED ORDER — CLOPIDOGREL BISULFATE 75 MG PO TABS: 75.0000 mg | ORAL_TABLET | Freq: Every day | ORAL | Status: DC

## 2016-11-06 NOTE — Discharge Summary (Signed)
Physician Discharge Summary   Patient ID: [name] [name] MRN: [id number] DOB/AGE: 06/02/1941 76 y.o.  Admit date: 11/05/2016 Discharge date: 11/06/2016  Primary Diagnosis:   Stenosis L2-L5  Admission Diagnoses:  Past Medical History:  Diagnosis Date  . Abdominal pain, unspecified site 10/16/2009  . Anal fissure 04/24/2008  . Arthritis   . B12 deficiency 10/11/2014  . BACK PAIN 01/10/2009  . Barrett's esophagus 1997  . BARRETTS ESOPHAGUS 08/16/2007  . BREAST HYPERTROPHY 02/16/2009  . CARBUNCLE, NECK 02/14/2008  . Cataract   . DIVERTICULOSIS, COLON 06/28/2007  . Full dentures   . GERD 06/28/2007  . GLUCOSE INTOLERANCE 08/15/2008  . Headache(784.0) 11/13/2009  . HERNIA, UMBILICAL W/OBSTRUCTION W/O GANGRENE 06/28/2007  . HTN (hypertension) 07/12/2012  . HYPERLIPIDEMIA 06/28/2007  . HYPERTENSION 06/28/2007  . Impaired glucose tolerance 04/13/2011  . INSOMNIA-SLEEP DISORDER-UNSPEC 10/10/2008  . LEG PAIN, BILATERAL 02/16/2009  . LOW BACK PAIN 06/28/2007  . MASTITIS 01/10/2009  . NECK PAIN 05/31/2008  . NEPHROLITHIASIS, HX OF 06/28/2007  . PROCTOSIGMOIDITIS, ULCERATIVE 1997  . PVD (peripheral vascular disease) with claudication, lifestyle limiting, ABI rt. 0.67, lt. 0.73 07/12/2012  . S/P angioplasty with stent, after DB athrectomy to Rt. ext. iliac and Rt. SFA 07/12/2012  . Skin cancer    right hand  . Spinal stenosis at L4-L5 level   . Stenosis of left carotid artery, mod to severe by dopplers 09/29/2012  . TESTICULAR PAIN, RIGHT 10/25/2010  . VITAMIN D DEFICIENCY 11/13/2009   Discharge Diagnoses:   Principal Problem:   Spinal stenosis of lumbar region Active Problems:   Spinal stenosis at L4-L5 level  Procedure:  Procedure(s) (LRB): Microlumbar decompression L2-3, L3-4, L4-5, lateral mass fusion L4-5 (N/A)   Consults: None  HPI:  see H&P    Laboratory Data: Hospital Outpatient Visit on 10/29/2016  Component Date Value Ref Range Status  . Sodium 10/29/2016 138  135 - 145 mmol/L Final    . Potassium 10/29/2016 4.7  3.5 - 5.1 mmol/L Final  . Chloride 10/29/2016 103  101 - 111 mmol/L Final  . CO2 10/29/2016 30  22 - 32 mmol/L Final  . Glucose, Bld 10/29/2016 118* 65 - 99 mg/dL Final  . BUN 10/29/2016 12  6 - 20 mg/dL Final  . Creatinine, Ser 10/29/2016 1.14  0.61 - 1.24 mg/dL Final  . Calcium 10/29/2016 9.1  8.9 - 10.3 mg/dL Final  . GFR calc non Af Amer 10/29/2016 >60  >60 mL/min Final  . GFR calc Af Amer 10/29/2016 >60  >60 mL/min Final   Comment: (NOTE) The eGFR has been calculated using the CKD EPI equation. This calculation has not been validated in all clinical situations. eGFR's persistently <60 mL/min signify possible Chronic Kidney Disease.   . Anion gap 10/29/2016 5  5 - 15 Final  . WBC 10/29/2016 6.4  4.0 - 10.5 K/uL Final  . RBC 10/29/2016 5.26  4.22 - 5.81 MIL/uL Final  . Hemoglobin 10/29/2016 15.4  13.0 - 17.0 g/dL Final  . HCT 10/29/2016 45.9  39.0 - 52.0 % Final  . MCV 10/29/2016 87.3  78.0 - 100.0 fL Final  . MCH 10/29/2016 29.3  26.0 - 34.0 pg Final  . MCHC 10/29/2016 33.6  30.0 - 36.0 g/dL Final  . RDW 10/29/2016 14.7  11.5 - 15.5 % Final  . Platelets 10/29/2016 156  150 - 400 K/uL Final  . MRSA, PCR 10/29/2016 NEGATIVE  NEGATIVE Final  . Staphylococcus aureus 10/29/2016 NEGATIVE  NEGATIVE Final   Comment:  The Xpert SA Assay (FDA approved for NASAL specimens in patients over 80 years of age), is one component of a comprehensive surveillance program.  Test performance has been validated by [facility] for patients greater than or equal to 35 year old. It is not intended to diagnose infection nor to guide or monitor treatment.     Recent Labs  11/06/16 0421  HGB 13.1    Recent Labs  11/06/16 0421  WBC 14.6*  RBC 4.59  HCT 39.7  PLT 192    Recent Labs  11/06/16 0421  NA 136  K 4.2  CL 101  CO2 26  BUN 15  CREATININE 1.17  GLUCOSE 121*  CALCIUM 8.6*   No results for input(s): LABPT, INR in the last 72  hours.  X-Rays:Dg Lumbar Spine 2-3 Views  Result Date: 10/29/2016 CLINICAL DATA:  76 year old male with a history of preoperative study for lumbar decompression L2 through L5. EXAM: LUMBAR SPINE - 2-3 VIEW COMPARISON:  CT abdomen 12/07/2009 FINDINGS: Lumbar Spine: Note the numbering system: Using CT dated 12/07/2009, there are 6 non rib-bearing lumbar type vertebral bodies with the L6/S1 body demonstrating bilateral TP pseudoarticulation with the sacrum. Also, the MRI performed 08/15/2016 does not use the current numbering system. Vertebral elements maintain alignment with no subluxation. Trace anterolisthesis of L5 on L6. No fracture line identified.  Vertebral body heights maintained. Disc space narrowing most pronounced at L1-L2. Facet changes are most pronounced from L3 through S2. Aortic atherosclerosis and iliac calcifications, with vascular stent within the right iliac system. IMPRESSION: Note the numbering system has been assigned using CT dated 12/07/2009, with the presence of 6 non rib-bearing lumbar vertebral bodies. A different numbering system was used on the MR dated 08/15/2016. No radiographic evidence of acute fracture malalignment. Degenerative disc disease present throughout the lumbar spine, with worst facet disease spanning L3 through S2. Aortic atherosclerosis, with arterial stent in the right iliac system. Electronically Signed   By: Corrie Mckusick D.O.   On: 10/29/2016 13:28   Dg Spine Portable 1 View  Result Date: 11/05/2016 CLINICAL DATA:  Patient undergoing low lumbar surgery. Intraoperative localization film. EXAM: PORTABLE SPINE - 1 VIEW COMPARISON:  Intraoperative localization films earlier today. Plain films lumbar spine 10/29/2016. Lumbar spine MRI 08/15/2016. FINDINGS: Single lateral view is provided. Probes are seen at the level of the L2-3 disc interspace and L5 pedicles. Clamps on the L3 and L4 spinous processes are seen. IMPRESSION: Localization as above. Electronically  Signed   By: Inge Rise M.D.   On: 11/05/2016 09:47   Dg Spine Portable 1 View  Result Date: 11/05/2016 CLINICAL DATA:  Spinal numbering.  L2-3, L3-4, and L4-5. EXAM: PORTABLE SPINE - 1 VIEW COMPARISON:  Radiography from today at 7:38 a.m. FINDINGS: Same spinal numbering as used on earlier film and preoperative MRI 08/15/2016. This differs from numbering at radiography 10/29/2016. Discussed with Dr Tonita Cong in the [facility], we will use the MRI numbering scheme. Retractors overlap the L3 and L4 spinous processes. A probe projects at the L4-5 interspinous space. IMPRESSION: Surgical probe projects at the L4-5 interspinous space. Electronically Signed   By: Monte Fantasia M.D.   On: 11/05/2016 08:38   Dg Spine Portable 1 View  Result Date: 11/05/2016 CLINICAL DATA:  Intraoperative localization EXAM: PORTABLE SPINE - 1 VIEW COMPARISON:  08/15/2016 FINDINGS: Needles are noted in the posterior soft tissues at the L2-3, L3-4 and L4-5 levels. This numbering nomenclature reflects to that utilized on prior MRI examination from  08/15/2016. Confirmation was obtained from Dr. Tonita Cong in the operating room to assure that the appropriate numbering system was utilized. IMPRESSION: Intraoperative localization at L2-3, L3-4 and L4-5. Electronically Signed   By: Inez Catalina M.D.   On: 11/05/2016 08:09    EKG: Orders placed or performed in visit on 05/24/15  . EKG 12-Lead     Hospital Course: Patient was admitted to [facility] and taken to the OR and underwent the above state procedure without complications.  Patient tolerated the procedure well and was later transferred to the recovery room and then to the orthopaedic floor for postoperative care.  They were given PO and IV analgesics for pain control following their surgery.  They were given 24 hours of postoperative antibiotics.   PT was consulted postop to assist with mobility and transfers.  The patient was allowed to be WBAT with therapy and was taught back  precautions. Discharge planning was consulted to help with postop disposition and equipment needs.  Patient had a good night on the evening of surgery and started to get up OOB with therapy on day one. Patient was seen in rounds and was ready to go home on day one.  They were given discharge instructions and dressing directions. Drain was pulled and dressing changed. They were instructed on when to follow up in the office with Dr. Tonita Cong.   Diet: Regular diet Activity:WBAT; Lspine precautions Follow-up:in 10-14 days Disposition - Home Discharged Condition: good   Discharge Instructions    Call MD / Call 911    Complete by:  As directed    If you experience chest pain or shortness of breath, CALL 911 and be transported to the hospital emergency room.  If you develope a fever above 101 F, pus (white drainage) or increased drainage or redness at the wound, or calf pain, call your surgeon's office.   Constipation Prevention    Complete by:  As directed    Drink plenty of fluids.  Prune juice may be helpful.  You may use a stool softener, such as Colace (over the counter) 100 mg twice a day.  Use MiraLax (over the counter) for constipation as needed.   Diet - low sodium heart healthy    Complete by:  As directed    Increase activity slowly as tolerated    Complete by:  As directed      Allergies as of 11/06/2016      Reactions   Contrast Media [iodinated Diagnostic Agents]    Break out in welts   Lipitor [atorvastatin] Other (See Comments)   myalgias   Lovastatin    Muscle pain   Statins    Legs pain      Medication List    STOP taking these medications   naproxen 500 MG tablet Commonly known as:  NAPROSYN   tamoxifen 20 MG tablet Commonly known as:  NOLVADEX     TAKE these medications   aspirin 81 MG EC tablet Take 1 tablet (81 mg total) by mouth daily. Resume 4 days post-op What changed:  additional instructions   clopidogrel 75 MG tablet Commonly known as:  PLAVIX Take  1 tablet (75 mg total) by mouth daily. Resume 5 days post-op. Take 1 tablet by mouth  daily What changed:  how much to take  how to take this  when to take this  additional instructions  Another medication with the same name was removed. Continue taking this medication, and follow the directions you see  here.   cyanocobalamin 1000 MCG tablet Take 1,000 mcg by mouth daily. On hold for procedure   docusate sodium 100 MG capsule Commonly known as:  COLACE Take 1 capsule (100 mg total) by mouth 2 (two) times daily as needed for mild constipation.   ezetimibe 10 MG tablet Commonly known as:  ZETIA TAKE 1 TABLET BY MOUTH  DAILY   HIBICLENS 4 % external liquid Generic drug:  chlorhexidine Apply topically daily. DO 5 DAYS PRIOR TO SURGERY PER DR BENAE   HYDROcodone-acetaminophen 5-325 MG tablet Commonly known as:  NORCO/VICODIN Take 1-2 tablets by mouth every 4 (four) hours as needed for severe pain.   LIALDA 1.2 g EC tablet Generic drug:  mesalamine Take 2 tablets (2.4 g total) by mouth daily with breakfast. Resume 5 days post-op. TAKE 2 TABLETS BY MOUTH  DAILY WITH BREAKFAST What changed:  how much to take  how to take this  when to take this  additional instructions   lisinopril-hydrochlorothiazide 20-25 MG tablet Commonly known as:  PRINZIDE,ZESTORETIC TAKE 1 TABLET BY MOUTH  DAILY   mupirocin ointment 2 % Commonly known as:  BACTROBAN Place 1 application into the nose 2 (two) times daily. 5 DAYS PRIOR TO SURGERY UP NOSE   pantoprazole 40 MG tablet Commonly known as:  PROTONIX TAKE 1 TABLET BY MOUTH  DAILY   zolpidem 12.5 MG CR tablet Commonly known as:  AMBIEN CR TAKE 1 TABLET BY MOUTH AT BEDTIME AS NEEDED FOR SLEEP      Follow-up Information    BEANE,JEFFREY C, MD Follow up in 2 week(s).   Specialty:  Orthopedic Surgery Contact information: [address] [place] [address] [phone]           Signed: Lacie Draft,  PA-C Orthopaedic Surgery 11/06/2016, 2:03 PM

## 2016-11-06 NOTE — Progress Notes (Signed)
Pt d/c'd home with daughter. Aquacel applied to incision. Patient's pain is well controlled. Patient voiding without difficulty. Prescriptions given and explained. AVS reviewed. Patient understands follow-up appointments, medications, and signs and symptoms of infection. Patient has no concerns and is excited about d/c home.

## 2016-11-06 NOTE — Evaluation (Signed)
Physical Therapy Evaluation Patient Details Name: [name] [name] MRN: [id number] DOB: [birth year] Today's Date: 11/06/2016   History of Present Illness  Microlumbar decompression L2-3, L3-4, L4-5, lateral mass fusion L4-5   Clinical Impression  Patient evaluated by Physical Therapy with no further acute PT needs identified. All education has been completed and the patient has no further questions. See below for any follow-up Physical Therapy or equipment needs. PT is signing off. Thank you for this referral.     Follow Up Recommendations No PT follow up    Equipment Recommendations  None recommended by PT    Recommendations for Other Services       Precautions / Restrictions Precautions Precautions: Back      Mobility  Bed Mobility               General bed mobility comments: pt in chair; verbally reviewed log roll; pt is a side sleeper   Transfers Overall transfer level: Needs assistance Equipment used: Rolling walker (2 wheeled) Transfers: Sit to/from Stand Sit to Stand: Supervision;Modified independent (Device/Increase time)         General transfer comment: cues for technique/posture  Ambulation/Gait Ambulation/Gait assistance: Min guard Ambulation Distance (Feet): 200 Feet Assistive device: Rolling walker (2 wheeled) Gait Pattern/deviations: Step-through pattern;Decreased stride length     General Gait Details: cues for posture  Stairs Stairs: Yes Stairs assistance: Min guard Stair Management: Two rails;Step to pattern Number of Stairs: 5 General stair comments: cues for safety and sequence  Wheelchair Mobility    Modified Rankin (Stroke Patients Only)       Balance Overall balance assessment: Needs assistance           Standing balance-Leahy Scale: Fair                               Pertinent Vitals/Pain Pain Assessment: 0-10 Pain Score: 2  Pain Location: back Pain Descriptors / Indicators: Sore Pain  Intervention(s): Limited activity within patient's tolerance;Monitored during session    Home Living Family/patient expects to be discharged to:: Private residence Living Arrangements: Children Available Help at Discharge: Family Type of Home: House Home Access: Stairs to enter     Home Layout: One level Home Equipment: Environmental consultant - 2 wheels      Prior Function Level of Independence: Independent               Hand Dominance        Extremity/Trunk Assessment   Upper Extremity Assessment Upper Extremity Assessment: Defer to OT evaluation    Lower Extremity Assessment Lower Extremity Assessment: Overall WFL for tasks assessed       Communication   Communication: No difficulties  Cognition Arousal/Alertness: Awake/alert Behavior During Therapy: WFL for tasks assessed/performed Overall Cognitive Status: Within Functional Limits for tasks assessed                      General Comments      Exercises     Assessment/Plan    PT Assessment Patent does not need any further PT services  PT Problem List         PT Treatment Interventions      PT Goals (Current goals can be found in the Care Plan section)  Acute Rehab PT Goals Patient Stated Goal: home asap PT Goal Formulation: All assessment and education complete, DC therapy    Frequency     Barriers to discharge  Co-evaluation               End of Session   Activity Tolerance: Patient tolerated treatment well Patient left: with call bell/phone within reach;in chair;with family/visitor present;with chair alarm set Nurse Communication: Mobility status PT Visit Diagnosis: Pain;Difficulty in walking, not elsewhere classified (R26.2) Pain - part of body:  (back)    Functional Assessment Tool Used: AM-PAC 6 Clicks Basic Mobility;Clinical judgement Functional Limitation: Mobility: Walking and moving around Mobility: Walking and Moving Around Current Status (R7408): At least 1 percent  but less than 20 percent impaired, limited or restricted Mobility: Walking and Moving Around Goal Status [phone]): At least 1 percent but less than 20 percent impaired, limited or restricted Mobility: Walking and Moving Around Discharge Status [phone]): At least 1 percent but less than 20 percent impaired, limited or restricted    Time: 4970-2637 PT Time Calculation (min) (ACUTE ONLY): 20 min   Charges:   PT Evaluation $PT Eval Low Complexity: 1 Procedure     PT G Codes:   PT G-Codes **NOT FOR INPATIENT CLASS** Functional Assessment Tool Used: AM-PAC 6 Clicks Basic Mobility;Clinical judgement Functional Limitation: Mobility: Walking and moving around Mobility: Walking and Moving Around Current Status (C5885): At least 1 percent but less than 20 percent impaired, limited or restricted Mobility: Walking and Moving Around Goal Status [phone]): At least 1 percent but less than 20 percent impaired, limited or restricted Mobility: Walking and Moving Around Discharge Status [phone]): At least 1 percent but less than 20 percent impaired, limited or restricted     [facility] 11/06/2016, 11:48 AM

## 2016-11-06 NOTE — Evaluation (Signed)
Occupational Therapy Evaluation Patient Details Name: [name] [name] MRN: [id number] DOB: 12/05/1940 Today's Date: 11/06/2016    History of Present Illness Microlumbar decompression L2-3, L3-4, L4-5, lateral mass fusion L4-5    Clinical Impression   This 76 year old man was admitted for the above sx. All education was completed. No further OT is needed at this time    Follow Up Recommendations  No OT follow up;Supervision/Assistance - 24 hour    Equipment Recommendations  None recommended by OT    Recommendations for Other Services       Precautions / Restrictions Precautions Precautions: Back Precaution Comments: no brace Restrictions Weight Bearing Restrictions: No      Mobility Bed Mobility               General bed mobility comments: pt in chair; verbally reviewed log roll; pt is a side sleeper   Transfers Overall transfer level: Needs assistance Equipment used: Rolling walker (2 wheeled) Transfers: Sit to/from Stand Sit to Stand: Supervision;Modified independent (Device/Increase time)         General transfer comment: cues for technique/posture    Balance Overall balance assessment: Needs assistance           Standing balance-Leahy Scale: Fair                              ADL Overall ADL's : Needs assistance/impaired Eating/Feeding: Independent   Grooming: Oral care;Supervision/safety;Standing   Upper Body Bathing: Set up;Sitting   Lower Body Bathing: Sit to/from stand;supervision;With adaptive equipment   Upper Body Dressing : Set up;Sitting   Lower Body Dressing: Minimal/Moderate assistance;Sit to/from stand;With adaptive equipment   Toilet Transfer: Min guard;Comfort height toilet;RW Armed forces technical officer Details (indicate cue type and reason): pt has a standard commode at home:  he does not want DME for this. Able to get up from comfort height following precautions Toileting- Clothing Manipulation and Hygiene: Minimal  assistance Toileting - Clothing Manipulation Details (indicate cue type and reason): educated on toilet aide options Tub/ Shower Transfer: Walk-in shower;Min guard;Ambulation;Shower seat   Functional mobility during ADLs: Min guard General ADL Comments: all education completed regarding ADL options (AE, positioning or help) following back precautions     Vision         Perception     Praxis      Pertinent Vitals/Pain Pain Assessment: 0-10 Pain Score: 5  Pain Location: back Pain Descriptors / Indicators: Other (Comment) (incisional) Pain Intervention(s): Limited activity within patient's tolerance;Monitored during session;Patient requesting pain meds-RN notified;Repositioned     Hand Dominance     Extremity/Trunk Assessment Upper Extremity Assessment Upper Extremity Assessment: Overall WFL for tasks assessed   Lower Extremity Assessment Lower Extremity Assessment: Overall WFL for tasks assessed       Communication Communication Communication: No difficulties   Cognition Arousal/Alertness: Awake/alert Behavior During Therapy: WFL for tasks assessed/performed Overall Cognitive Status: Within Functional Limits for tasks assessed                     General Comments       Exercises       Shoulder Instructions      Home Living Family/patient expects to be discharged to:: Private residence Living Arrangements: Children Available Help at Discharge: Family Type of Home: House Home Access: Stairs to enter     Home Layout: One level     Bathroom Shower/Tub: Occupational psychologist: Standard  Home Equipment: Gilford Rile - 2 wheels          Prior Functioning/Environment Level of Independence: Independent                 OT Problem List:        OT Treatment/Interventions:      OT Goals(Current goals can be found in the care plan section) Acute Rehab OT Goals Patient Stated Goal: home asap  OT Frequency:     Barriers to D/C:             Co-evaluation              End of Session    Activity Tolerance: Patient tolerated treatment well Patient left: in chair;with family/visitor present;with call bell/phone within reach;with chair alarm set  OT Visit Diagnosis: Muscle weakness (generalized) (M62.81)                ADL either performed or assessed with clinical judgement  Time: 1138-1203 OT Time Calculation (min): 25 min Charges:  OT General Charges $OT Visit: 1 Procedure OT Evaluation $OT Eval Low Complexity: 1 Procedure G-Codes: OT G-codes **NOT FOR INPATIENT CLASS** Functional Assessment Tool Used: AM-PAC 6 Clicks Daily Activity;Clinical judgement Functional Limitation: Self care Self Care Current Status (B1694): At least 20 percent but less than 40 percent impaired, limited or restricted Self Care Goal Status (H0388): At least 20 percent but less than 40 percent impaired, limited or restricted Self Care Discharge Status [phone]): At least 20 percent but less than 40 percent impaired, limited or restricted   Lesle Chris, OTR/L [phone] 11/06/2016  [name] [name] 11/06/2016, 1:36 PM

## 2016-11-06 NOTE — Progress Notes (Signed)
Subjective: 1 Day Post-Op Procedure(s) (LRB): Microlumbar decompression L2-3, L3-4, L4-5, lateral mass fusion L4-5 (N/A) Patient reports pain as 3 on 0-10 scale.    Objective: Vital signs in last 24 hours: Temp:  [97.4 F (36.3 C)-98.6 F (37 C)] 97.5 F (36.4 C) (03/01 0931) Pulse Rate:  [61-81] 70 (03/01 0931) Resp:  [10-19] 16 (03/01 0527) BP: (114-174)/(47-69) 130/50 (03/01 0931) SpO2:  [95 %-100 %] 96 % (03/01 0931)  Intake/Output from previous day: 02/28 0701 - 03/01 0700 In: 3384.2 [P.O.:1020; I.V.:1914.2; IV Piggyback:250] Out: 3070 [Urine:2435; Drains:235; Blood:400] Intake/Output this shift: Total I/O In: 480 [P.O.:480] Out: 340 [Urine:300; Drains:40]   Recent Labs  11/06/16 0421  HGB 13.1    Recent Labs  11/06/16 0421  WBC 14.6*  RBC 4.59  HCT 39.7  PLT 192    Recent Labs  11/06/16 0421  NA 136  K 4.2  CL 101  CO2 26  BUN 15  CREATININE 1.17  GLUCOSE 121*  CALCIUM 8.6*   No results for input(s): LABPT, INR in the last 72 hours.  Neurologically intact Neurovascular intact Sensation intact distally Intact pulses distally Dorsiflexion/Plantar flexion intact Incision: moderate drainage Dressing changed sterally. HV D/C'd tip intact. Assessment/Plan: 1 Day Post-Op Procedure(s) (LRB): Microlumbar decompression L2-3, L3-4, L4-5, lateral mass fusion L4-5 (N/A) Advance diet Up with therapy D/C IV fluids  Possible D/C  Natiya Seelinger C 11/06/2016, 10:31 AM

## 2016-11-06 NOTE — Discharge Instructions (Signed)
Walk As Tolerated utilizing back precautions.  No bending, twisting, or lifting.  No driving for 2 weeks.   Aquacel dressing may remain in place until follow up. May shower with aquacel dressing in place. If the dressing peels off or becomes saturated, you may remove aquacel dressing and place gauze and tape dressing which should be kept clean and dry and changed daily. Do not remove steri-strips if they are present. See Dr. Tonita Cong in office in 10 to 14 days. Begin taking aspirin 63m per day starting 4 days after your surgery and resume Plavix 739m5 days after surgery if not allergic to aspirin or on another blood thinner. Resume anti-inflammatories 5 days post-op as needed. Walk daily even outside. Use a cane or walker only if necessary. Avoid sitting on soft sofas.

## 2016-11-26 ENCOUNTER — Encounter: Payer: Medicare Other | Admitting: Internal Medicine

## 2016-11-28 NOTE — Op Note (Signed)
NAME:  [name] [name], [name] [name]                     ACCOUNT NO.:  MEDICAL RECORD NO.:  [id number]  LOCATION:                                 FACILITY:  PHYSICIAN:  Susa Day, M.D.    DATE OF BIRTH:  1941-04-26  DATE OF PROCEDURE:  11/05/2016 DATE OF DISCHARGE:                              OPERATIVE REPORT   ADDENDUM:  Under procedures performed, instead of Gill laminectomies of L3 and L4, put central laminectomies of L3 and L4.     Susa Day, M.D.     Geralynn Rile  D:  11/27/2016  T:  11/27/2016  Job:  [id number]

## 2016-12-12 ENCOUNTER — Other Ambulatory Visit: Payer: Self-pay

## 2016-12-12 MED ORDER — LIALDA 1.2 G PO TBEC: 2.4000 g | DELAYED_RELEASE_TABLET | Freq: Every day | ORAL | 0 refills | Status: DC

## 2016-12-15 ENCOUNTER — Other Ambulatory Visit: Payer: Self-pay | Admitting: Internal Medicine

## 2016-12-15 ENCOUNTER — Other Ambulatory Visit: Payer: Self-pay | Admitting: Gastroenterology

## 2016-12-16 ENCOUNTER — Other Ambulatory Visit: Payer: Self-pay | Admitting: Internal Medicine

## 2016-12-19 DIAGNOSIS — M4807 Spinal stenosis, lumbosacral region: Secondary | ICD-10-CM | POA: Diagnosis not present

## 2016-12-23 ENCOUNTER — Ambulatory Visit (INDEPENDENT_AMBULATORY_CARE_PROVIDER_SITE_OTHER): Payer: Medicare Other | Admitting: Internal Medicine

## 2016-12-23 ENCOUNTER — Encounter: Payer: Self-pay | Admitting: Internal Medicine

## 2016-12-23 ENCOUNTER — Other Ambulatory Visit (INDEPENDENT_AMBULATORY_CARE_PROVIDER_SITE_OTHER): Payer: Medicare Other

## 2016-12-23 VITALS — BP 124/64 | HR 82 | Ht 68.0 in | Wt 223.0 lb

## 2016-12-23 DIAGNOSIS — Z0000 Encounter for general adult medical examination without abnormal findings: Secondary | ICD-10-CM | POA: Diagnosis not present

## 2016-12-23 DIAGNOSIS — R7302 Impaired glucose tolerance (oral): Secondary | ICD-10-CM | POA: Diagnosis not present

## 2016-12-23 LAB — CBC WITH DIFFERENTIAL/PLATELET
BASOS ABS: 0.1 10*3/uL (ref 0.0–0.1)
Basophils Relative: 0.6 % (ref 0.0–3.0)
EOS ABS: 0.2 10*3/uL (ref 0.0–0.7)
EOS PCT: 2.6 % (ref 0.0–5.0)
HCT: 47.4 % (ref 39.0–52.0)
HEMOGLOBIN: 15.8 g/dL (ref 13.0–17.0)
Lymphocytes Relative: 9.8 % — ABNORMAL LOW (ref 12.0–46.0)
Lymphs Abs: 0.9 10*3/uL (ref 0.7–4.0)
MCHC: 33.4 g/dL (ref 30.0–36.0)
MCV: 88.1 fl (ref 78.0–100.0)
MONO ABS: 0.8 10*3/uL (ref 0.1–1.0)
Monocytes Relative: 8.6 % (ref 3.0–12.0)
Neutro Abs: 7 10*3/uL (ref 1.4–7.7)
Neutrophils Relative %: 78.4 % — ABNORMAL HIGH (ref 43.0–77.0)
Platelets: 245 10*3/uL (ref 150.0–400.0)
RBC: 5.38 Mil/uL (ref 4.22–5.81)
RDW: 15.2 % (ref 11.5–15.5)
WBC: 8.9 10*3/uL (ref 4.0–10.5)

## 2016-12-23 LAB — HEPATIC FUNCTION PANEL
ALT: 15 U/L (ref 0–53)
AST: 18 U/L (ref 0–37)
Albumin: 4.1 g/dL (ref 3.5–5.2)
Alkaline Phosphatase: 61 U/L (ref 39–117)
BILIRUBIN TOTAL: 0.5 mg/dL (ref 0.2–1.2)
Bilirubin, Direct: 0.1 mg/dL (ref 0.0–0.3)
TOTAL PROTEIN: 6.6 g/dL (ref 6.0–8.3)

## 2016-12-23 LAB — LIPID PANEL
CHOL/HDL RATIO: 6
Cholesterol: 186 mg/dL (ref 0–200)
HDL: 29.5 mg/dL — ABNORMAL LOW (ref >39.00)
LDL Cholesterol: 128 mg/dL — ABNORMAL HIGH (ref 0–99)
NonHDL: 156.48
Triglycerides: 143 mg/dL (ref 0.0–149.0)
VLDL: 28.6 mg/dL (ref 0.0–40.0)

## 2016-12-23 LAB — BASIC METABOLIC PANEL
BUN: 11 mg/dL (ref 6–23)
CHLORIDE: 101 meq/L (ref 96–112)
CO2: 29 mEq/L (ref 19–32)
CREATININE: 1.07 mg/dL (ref 0.40–1.50)
Calcium: 9.2 mg/dL (ref 8.4–10.5)
GFR: 71.51 mL/min (ref >60.00)
Glucose, Bld: 111 mg/dL — ABNORMAL HIGH (ref 70–99)
Potassium: 4.7 mEq/L (ref 3.5–5.1)
Sodium: 138 mEq/L (ref 135–145)

## 2016-12-23 LAB — URINALYSIS, ROUTINE W REFLEX MICROSCOPIC
BILIRUBIN URINE: NEGATIVE
Hgb urine dipstick: NEGATIVE
KETONES UR: NEGATIVE
Leukocytes, UA: NEGATIVE
NITRITE: NEGATIVE
RBC / HPF: NONE SEEN (ref 0–?)
Specific Gravity, Urine: 1.015 (ref 1.000–1.030)
Total Protein, Urine: NEGATIVE
UROBILINOGEN UA: 0.2 (ref 0.0–1.0)
Urine Glucose: NEGATIVE
WBC UA: NONE SEEN (ref 0–?)
pH: 7.5 (ref 5.0–8.0)

## 2016-12-23 LAB — TSH: TSH: 1.58 u[IU]/mL (ref 0.35–4.50)

## 2016-12-23 LAB — HEMOGLOBIN A1C: Hgb A1c MFr Bld: 5.7 % (ref 4.6–6.5)

## 2016-12-23 LAB — PSA: PSA: 1.88 ng/mL (ref 0.10–4.00)

## 2016-12-23 NOTE — Assessment & Plan Note (Signed)
Also for a1c,  to f/u any worsening symptoms or concerns, cont work on diet and wt loss efforts

## 2016-12-23 NOTE — Progress Notes (Signed)
Pre visit review using our clinic review tool, if applicable. No additional management support is needed unless otherwise documented below in the visit note. 

## 2016-12-23 NOTE — Progress Notes (Signed)
Subjective:    Patient ID: [name] [name], male    DOB: 04-Dec-1940, 76 y.o.   MRN: [id number]  HPI  Here for wellness and f/u;  Overall doing ok;  Pt denies Chest pain, worsening SOB, DOE, wheezing, orthopnea, PND, worsening LE edema, palpitations, dizziness or syncope.  Pt denies neurological change such as new headache, facial or extremity weakness.  Pt denies polydipsia, polyuria, or low sugar symptoms. Pt states overall good compliance with treatment and medications, good tolerability, and has been trying to follow appropriate diet.  Pt denies worsening depressive symptoms, suicidal ideation or panic. No fever, night sweats, wt loss, loss of appetite, or other constitutional symptoms.  Pt states good ability with ADL's, has low fall risk, home safety reviewed and adequate, no other significant changes in hearing or vision, and not active with exercise as he is now 6 wks out from lumbar surgury with still some lower back soreness but tolerable and able to do all ADL;s Past Medical History:  Diagnosis Date  . Abdominal pain, unspecified site 10/16/2009  . Anal fissure 04/24/2008  . Arthritis   . B12 deficiency 10/11/2014  . BACK PAIN 01/10/2009  . Barrett's esophagus 1997  . BARRETTS ESOPHAGUS 08/16/2007  . BREAST HYPERTROPHY 02/16/2009  . CARBUNCLE, NECK 02/14/2008  . Cataract   . DIVERTICULOSIS, COLON 06/28/2007  . Full dentures   . GERD 06/28/2007  . GLUCOSE INTOLERANCE 08/15/2008  . Headache(784.0) 11/13/2009  . HERNIA, UMBILICAL W/OBSTRUCTION W/O GANGRENE 06/28/2007  . HTN (hypertension) 07/12/2012  . HYPERLIPIDEMIA 06/28/2007  . HYPERTENSION 06/28/2007  . Impaired glucose tolerance 04/13/2011  . INSOMNIA-SLEEP DISORDER-UNSPEC 10/10/2008  . LEG PAIN, BILATERAL 02/16/2009  . LOW BACK PAIN 06/28/2007  . MASTITIS 01/10/2009  . NECK PAIN 05/31/2008  . NEPHROLITHIASIS, HX OF 06/28/2007  . PROCTOSIGMOIDITIS, ULCERATIVE 1997  . PVD (peripheral vascular disease) with claudication, lifestyle limiting,  ABI rt. 0.67, lt. 0.73 07/12/2012  . S/P angioplasty with stent, after DB athrectomy to Rt. ext. iliac and Rt. SFA 07/12/2012  . Skin cancer    right hand  . Spinal stenosis at L4-L5 level   . Stenosis of left carotid artery, mod to severe by dopplers 09/29/2012  . TESTICULAR PAIN, RIGHT 10/25/2010  . VITAMIN D DEFICIENCY 11/13/2009   Past Surgical History:  Procedure Laterality Date  . anal fissure surgury     pt unsure of this  . ATHERECTOMY  09/28/2012  . ATHERECTOMY N/A 07/12/2012   Procedure: ATHERECTOMY;  Surgeon: [name], MD;  Location: [facility] CATH LAB;  Service: Cardiovascular;  Laterality: N/A;  . ATHERECTOMY N/A 09/28/2012   Procedure: ATHERECTOMY;  Surgeon: [name], MD;  Location: [facility] CATH LAB;  Service: Cardiovascular;  Laterality: N/A;  . bilateral inguinal hernia with mesh and umbilical hernia repairs  09/2007  . CATARACT EXTRACTION Bilateral    bilateral  . COLONOSCOPY    . GROIN DISSECTION Right 07/20/2013   Procedure: GROIN EXPLORATION, EXCISIONAL BIOPSY RIGHT INGUINAL LYMPH NODES;  Surgeon: Harl Bowie, MD;  Location: [facility];  Service: General;  Laterality: Right;  . HERNIA REPAIR    . left knee arthroscopy    . LUMBAR LAMINECTOMY/DECOMPRESSION MICRODISCECTOMY N/A 11/05/2016   Procedure: Microlumbar decompression L2-3, L3-4, L4-5, lateral mass fusion L4-5;  Surgeon: Susa Day, MD;  Location: [facility];  Service: Orthopedics;  Laterality: N/A;  . PERCUTANEOUS STENT INTERVENTION  09/28/2012   Procedure: PERCUTANEOUS STENT INTERVENTION;  Surgeon: [name], MD;  Location: [facility] CATH LAB;  Service: Cardiovascular;;  . UPPER GI ENDOSCOPY      reports that he quit smoking about 44 years ago. His smokeless tobacco use includes Chew. He reports that he drinks about 3.0 oz of alcohol per week . He reports that he does not use drugs. family history includes COPD in his father and mother; Cerebral aneurysm in his mother. Allergies  Allergen  Reactions  . Contrast Media [Iodinated Diagnostic Agents]     Break out in welts  . Lipitor [Atorvastatin] Other (See Comments)    myalgias  . Lovastatin     Muscle pain  . Statins     Legs pain   Current Outpatient Prescriptions on File Prior to Visit  Medication Sig Dispense Refill  . aspirin 81 MG EC tablet Take 1 tablet (81 mg total) by mouth daily. Resume 4 days post-op 30 tablet 12  . chlorhexidine (HIBICLENS) 4 % external liquid Apply topically daily. DO 5 DAYS PRIOR TO SURGERY PER DR BENAE    . clopidogrel (PLAVIX) 75 MG tablet TAKE 1 TABLET BY MOUTH  DAILY 30 tablet 0  . cyanocobalamin 1000 MCG tablet Take 1,000 mcg by mouth daily. On hold for procedure    . docusate sodium (COLACE) 100 MG capsule Take 1 capsule (100 mg total) by mouth 2 (two) times daily as needed for mild constipation. 30 capsule 1  . ezetimibe (ZETIA) 10 MG tablet TAKE 1 TABLET BY MOUTH  DAILY 90 tablet 1  . HYDROcodone-acetaminophen (NORCO/VICODIN) 5-325 MG tablet Take 1-2 tablets by mouth every 4 (four) hours as needed for severe pain. 40 tablet 0  . LIALDA 1.2 g EC tablet Take 2 tablets (2.4 g total) by mouth daily with breakfast. Call for yearly appt. 180 tablet 0  . lisinopril-hydrochlorothiazide (PRINZIDE,ZESTORETIC) 20-25 MG tablet TAKE 1 TABLET BY MOUTH  DAILY 90 tablet 1  . mupirocin ointment (BACTROBAN) 2 % Place 1 application into the nose 2 (two) times daily. 5 DAYS PRIOR TO SURGERY UP NOSE    . pantoprazole (PROTONIX) 40 MG tablet TAKE 1 TABLET BY MOUTH  DAILY 90 tablet 0  . zolpidem (AMBIEN CR) 12.5 MG CR tablet TAKE 1 TABLET BY MOUTH AT BEDTIME AS NEEDED FOR SLEEP 30 tablet 5   No current facility-administered medications on file prior to visit.    Review of Systems Constitutional: Negative for other unusual diaphoresis, sweats, appetite or weight changes HENT: Negative for other worsening hearing loss, ear pain, facial swelling, mouth sores or neck stiffness.   Eyes: Negative for other  worsening pain, redness or other visual disturbance.  Respiratory: Negative for other stridor or swelling Cardiovascular: Negative for other palpitations or other chest pain  Gastrointestinal: Negative for worsening diarrhea or loose stools, blood in stool, distention or other pain Genitourinary: Negative for hematuria, flank pain or other change in urine volume.  Musculoskeletal: Negative for myalgias or other joint swelling.  Skin: Negative for other color change, or other wound or worsening drainage.  Neurological: Negative for other syncope or numbness. Hematological: Negative for other adenopathy or swelling Psychiatric/Behavioral: Negative for hallucinations, other worsening agitation, SI, self-injury, or new decreased concentration All other system neg per pt    Objective:   Physical Exam BP 124/64   Pulse 82   Ht 5' 8"  (1.727 m)   Wt 223 lb (101.2 kg)   SpO2 99%   BMI 33.91 kg/m  VS noted, not ill appearing, obese Constitutional: Pt is oriented to person, place, and time. Appears well-developed and well-nourished, in  no significant distress and comfortable Head: Normocephalic and atraumatic  Eyes: Conjunctivae and EOM are normal. Pupils are equal, round, and reactive to light Right Ear: External ear normal without discharge Left Ear: External ear normal without discharge Nose: Nose without discharge or deformity Mouth/Throat: Oropharynx is without other ulcerations and moist  Neck: Normal range of motion. Neck supple. No JVD present. No tracheal deviation present or significant neck LA or mass Cardiovascular: Normal rate, regular rhythm, normal heart sounds and intact distal pulses.   Pulmonary/Chest: WOB normal and breath sounds without rales or wheezing  Abdominal: Soft. Bowel sounds are normal. NT. No HSM  Musculoskeletal: Normal range of motion. Exhibits no edema Lymphadenopathy: Has no other cervical adenopathy.  Neurological: Pt is alert and oriented to person, place,  and time. Pt has normal reflexes. No cranial nerve deficit. Motor grossly intact, Gait intact Skin: Skin is warm and dry. No rash noted or new ulcerations Psychiatric:  Has normal mood and affect. Behavior is normal without agitation No other exam findings  ECg today I have personally interpreted Sinus  Rhythm - WNL    Assessment & Plan:

## 2016-12-23 NOTE — Assessment & Plan Note (Signed)

## 2016-12-23 NOTE — Patient Instructions (Signed)
Please continue all other medications as before, and refills have been done if requested.  Please have the pharmacy call with any other refills you may need.  Please continue your efforts at being more active, low cholesterol diet, and weight control.  You are otherwise up to date with prevention measures today.  Please keep your appointments with your specialists as you may have planned  Please go to the LAB in the Basement (turn left off the elevator) for the tests to be done today  You will be contacted by phone if any changes need to be made immediately.  Otherwise, you will receive a letter about your results with an explanation, but please check with MyChart first.  Please remember to sign up for MyChart if you have not done so, as this will be important to you in the future with finding out test results, communicating by private email, and scheduling acute appointments online when needed.  If you have Medicare related insurance (such as traditional Medicare, [employer] or [employer], or similar), Please make an appointment at the [facility] with Sharee Pimple, the [employer], for your Wellness Visit in this office, which is a benefit with your insurance.  Please return in 1 year for your yearly visit, or sooner if needed, with Lab testing done 3-5 days before

## 2017-02-07 ENCOUNTER — Other Ambulatory Visit: Payer: Self-pay | Admitting: Gastroenterology

## 2017-02-09 NOTE — Addendum Note (Signed)
Addendum  created 02/09/17 1142 by Myrtie Soman, MD   Sign clinical note

## 2017-02-13 ENCOUNTER — Other Ambulatory Visit: Payer: Self-pay | Admitting: Gastroenterology

## 2017-02-13 ENCOUNTER — Other Ambulatory Visit: Payer: Self-pay | Admitting: Internal Medicine

## 2017-02-13 DIAGNOSIS — L720 Epidermal cyst: Secondary | ICD-10-CM | POA: Diagnosis not present

## 2017-03-04 ENCOUNTER — Other Ambulatory Visit: Payer: Self-pay | Admitting: Internal Medicine

## 2017-03-04 DIAGNOSIS — L720 Epidermal cyst: Secondary | ICD-10-CM | POA: Diagnosis not present

## 2017-03-26 ENCOUNTER — Telehealth: Payer: Self-pay | Admitting: Gastroenterology

## 2017-03-26 NOTE — Telephone Encounter (Signed)
Patient with a history of UC.  He is asking for a jury excuse.  Are you willing to write?

## 2017-03-26 NOTE — Telephone Encounter (Signed)
His UC is under good control so there is no legitimate jury excuse.

## 2017-03-26 NOTE — Telephone Encounter (Signed)
Patient notified of response.

## 2017-04-23 ENCOUNTER — Other Ambulatory Visit: Payer: Self-pay | Admitting: Internal Medicine

## 2017-04-23 NOTE — Telephone Encounter (Signed)
Done hardcopy to Shirron  

## 2017-05-10 ENCOUNTER — Other Ambulatory Visit: Payer: Self-pay | Admitting: Gastroenterology

## 2017-05-10 ENCOUNTER — Other Ambulatory Visit: Payer: Self-pay | Admitting: Internal Medicine

## 2017-05-14 ENCOUNTER — Other Ambulatory Visit: Payer: Self-pay

## 2017-05-14 MED ORDER — LIALDA 1.2 G PO TBEC: DELAYED_RELEASE_TABLET | ORAL | 0 refills | Status: DC

## 2017-06-28 ENCOUNTER — Other Ambulatory Visit: Payer: Self-pay | Admitting: Gastroenterology

## 2017-07-06 ENCOUNTER — Encounter: Payer: Self-pay | Admitting: Gastroenterology

## 2017-07-06 ENCOUNTER — Encounter (INDEPENDENT_AMBULATORY_CARE_PROVIDER_SITE_OTHER): Payer: Self-pay

## 2017-07-06 ENCOUNTER — Ambulatory Visit (INDEPENDENT_AMBULATORY_CARE_PROVIDER_SITE_OTHER): Payer: Medicare Other | Admitting: Gastroenterology

## 2017-07-06 VITALS — BP 130/62 | HR 86 | Ht 68.0 in | Wt 227.4 lb

## 2017-07-06 DIAGNOSIS — K2270 Barrett's esophagus without dysplasia: Secondary | ICD-10-CM | POA: Diagnosis not present

## 2017-07-06 DIAGNOSIS — K5100 Ulcerative (chronic) pancolitis without complications: Secondary | ICD-10-CM

## 2017-07-06 MED ORDER — LIALDA 1.2 G PO TBEC: DELAYED_RELEASE_TABLET | ORAL | 12 refills | Status: DC

## 2017-07-06 MED ORDER — PANTOPRAZOLE SODIUM 40 MG PO TBEC: 40.0000 mg | DELAYED_RELEASE_TABLET | Freq: Every day | ORAL | 12 refills | Status: DC

## 2017-07-06 NOTE — Progress Notes (Signed)
    History of Present Illness: This is a 76 year old male returning for follow-up of UC and Barrett's esophagus.  He relates intermittent difficulties with diarrhea and states he takes Imodium when his diarrhea is active and sometimes on a preventative basis when he knows he needs to be out of the house for several hours.  This plan has worked quite well to manage his symptoms.  He notes many days in a row with normal bowel movements and then occasionally he will have a day or two with looser, more urgent stools.  Blood work from April reviewed.  Colonoscopy 01/2016 - Localized mild inflammation was found in the sigmoid colon secondary to left-sided colitis. Biopsied. - Localized mild inflammation was found in the rectum secondary to proctitis. Biopsied. - Moderate diverticulosis in the sigmoid colon. There was narrowing of the colon in association with the diverticular opening. There was evidence of diverticular spasm. Erythema was seen in association with the diverticular opening. There was no evidence of diverticular bleeding. Biopsied.  1. Surgical [P], random sites right - BENIGN COLONIC MUCOSA. NO ACTIVE COLITIS OR DYSPLASIA IDENTIFIED. 2. Surgical [P], random sites left side - BENIGN COLONIC MUCOSA. NO ACTIVE COLITIS OR DYSPLASIA IDENTIFIED. 3. Surgical [P], random sites recto-sig - CHRONIC, MILDLY ACTIVE COLITIS CONSISTENT WITH INFLAMMATORY BOWEL DISEASE. - NO DYSPLASIA OR MALIGNANCY IDENTIFIED.   Current Medications, Allergies, Past Medical History, Past Surgical History, Family History and Social History were reviewed in [employer] record.  Physical Exam: General: Well developed, well nourished, no acute distress Head: Normocephalic and atraumatic Eyes:  sclerae anicteric, EOMI Ears: Normal auditory acuity Mouth: No deformity or lesions Lungs: Clear throughout to auscultation Heart: Regular rate and rhythm; no murmurs, rubs or bruits Abdomen: Soft,  non tender and non distended. No masses, hepatosplenomegaly or hernias noted. Normal Bowel sounds Rectal: not done Musculoskeletal: Symmetrical with no gross deformities  Pulses:  Normal pulses noted Extremities: No clubbing, cyanosis, edema or deformities noted Neurological: Alert oriented x 4, grossly nonfocal Psychological:  Alert and cooperative. Normal mood and affect  Assessment and Recommendations:  1. Ulcerative colitis, pancolitis. Surveillance colonoscopy 01/2019.  Intermittent diarrhea does not appear to be related to active colitis, likely functional.  Continue Imodium twice daily as needed.  Avoid foods that exacerbate symptoms.  Renew Lialda 2.4 g every morning.  2. Barrett's esophagus. Surveillance EGD in 11/2017. REV in 4 months.   3. PVD with stent maintained on Plavix.

## 2017-07-06 NOTE — Patient Instructions (Addendum)
If you are age 76 or older, your body mass index should be between 23-30. Your Body mass index is 34.57 kg/m. If this is out of the aforementioned range listed, please consider follow up with your Primary Care Provider.  If you are age 30 or younger, your body mass index should be between 19-25. Your Body mass index is 34.57 kg/m. If this is out of the aformentioned range listed, please consider follow up with your Primary Care Provider.   We have sent the following medications to your pharmacy for you to pick up at your convenience:  Lialda  Protonix  Dr. Fuller Plan has requested that you follow up with him in the office in February of 2019. This appointment will be to discuss your upper endoscopy.  We will contact you by letter when it is time to schedule this office visit.  Thank you.

## 2017-10-10 ENCOUNTER — Other Ambulatory Visit: Payer: Self-pay | Admitting: Internal Medicine

## 2017-10-12 NOTE — Telephone Encounter (Signed)
Done erx 

## 2017-10-22 ENCOUNTER — Telehealth: Payer: Self-pay | Admitting: Gastroenterology

## 2017-10-22 NOTE — Telephone Encounter (Signed)
Patient's daughter Arrie Aran) states that her father was told by Dr. Fuller Plan that their is another strength of Imodium that is stronger than the OTC one he has been taking. Informed patient that their is not another strength of Imodium and per Dr. Lynne Leader last office visit note, patient should be taking Imodium twice daily as needed. [name] states her father has been taking it every day and still has been nervous about leaving his house. He is scared that he will have diarrhea in public. [name] is wondering if their is a stronger medication we can prescribed to help with his diarrhea symptoms. Please advise Dr. Fuller Plan.

## 2017-10-23 NOTE — Telephone Encounter (Signed)
Best option is increasing the dose of Imodium. take 1-2 qid prn

## 2017-10-26 NOTE — Telephone Encounter (Signed)
Informed patient's daughter to have her father increase the Imodium to 1-2 tablets qid prn. [name] verbalized understanding.

## 2017-11-04 ENCOUNTER — Encounter: Payer: Self-pay | Admitting: Gastroenterology

## 2017-12-19 ENCOUNTER — Other Ambulatory Visit: Payer: Self-pay

## 2017-12-19 ENCOUNTER — Emergency Department (HOSPITAL_COMMUNITY): Payer: Medicare Other

## 2017-12-19 ENCOUNTER — Emergency Department (HOSPITAL_COMMUNITY)
Admission: EM | Admit: 2017-12-19 | Discharge: 2017-12-19 | Disposition: A | Discharge status: Home / Self Care | Payer: Medicare Other | Attending: Emergency Medicine | Admitting: Emergency Medicine

## 2017-12-19 ENCOUNTER — Encounter (HOSPITAL_COMMUNITY): Payer: Self-pay

## 2017-12-19 DIAGNOSIS — I10 Essential (primary) hypertension: Secondary | ICD-10-CM | POA: Diagnosis not present

## 2017-12-19 DIAGNOSIS — Z79899 Other long term (current) drug therapy: Secondary | ICD-10-CM | POA: Diagnosis not present

## 2017-12-19 DIAGNOSIS — Z7901 Long term (current) use of anticoagulants: Secondary | ICD-10-CM | POA: Insufficient documentation

## 2017-12-19 DIAGNOSIS — H81399 Other peripheral vertigo, unspecified ear: Secondary | ICD-10-CM | POA: Diagnosis not present

## 2017-12-19 DIAGNOSIS — R42 Dizziness and giddiness: Secondary | ICD-10-CM | POA: Diagnosis not present

## 2017-12-19 DIAGNOSIS — R9389 Abnormal findings on diagnostic imaging of other specified body structures: Secondary | ICD-10-CM | POA: Insufficient documentation

## 2017-12-19 DIAGNOSIS — Z87891 Personal history of nicotine dependence: Secondary | ICD-10-CM | POA: Insufficient documentation

## 2017-12-19 DIAGNOSIS — I6502 Occlusion and stenosis of left vertebral artery: Secondary | ICD-10-CM

## 2017-12-19 DIAGNOSIS — R51 Headache: Secondary | ICD-10-CM | POA: Insufficient documentation

## 2017-12-19 LAB — CBC
HCT: 48.1 % (ref 39.0–52.0)
HEMOGLOBIN: 16.6 g/dL (ref 13.0–17.0)
MCH: 29.7 pg (ref 26.0–34.0)
MCHC: 34.5 g/dL (ref 30.0–36.0)
MCV: 86 fL (ref 78.0–100.0)
Platelets: 174 10*3/uL (ref 150–400)
RBC: 5.59 MIL/uL (ref 4.22–5.81)
RDW: 14.1 % (ref 11.5–15.5)
WBC: 7.8 10*3/uL (ref 4.0–10.5)

## 2017-12-19 LAB — BASIC METABOLIC PANEL
ANION GAP: 9 (ref 5–15)
BUN: 7 mg/dL (ref 6–20)
CALCIUM: 9.1 mg/dL (ref 8.9–10.3)
CHLORIDE: 102 mmol/L (ref 101–111)
CO2: 25 mmol/L (ref 22–32)
Creatinine, Ser: 0.93 mg/dL (ref 0.61–1.24)
GFR calc non Af Amer: >60 mL/min (ref >60)
GLUCOSE: 121 mg/dL — AB (ref 65–99)
Potassium: 3.4 mmol/L — ABNORMAL LOW (ref 3.5–5.1)
Sodium: 136 mmol/L (ref 135–145)

## 2017-12-19 LAB — I-STAT TROPONIN, ED: TROPONIN I, POC: 0 ng/mL (ref 0.00–0.08)

## 2017-12-19 MED ORDER — LORAZEPAM 2 MG/ML IJ SOLN
2.0000 mg | Freq: Once | INTRAMUSCULAR | Status: AC
  Administered 2017-12-19: 2 mg via INTRAVENOUS
  Filled 2017-12-19: qty 1

## 2017-12-19 MED ORDER — MECLIZINE HCL 12.5 MG PO TABS: 12.5000 mg | ORAL_TABLET | Freq: Three times a day (TID) | ORAL | 0 refills | Status: DC | PRN

## 2017-12-19 MED ORDER — ACETAMINOPHEN 500 MG PO TABS
1000.0000 mg | ORAL_TABLET | Freq: Once | ORAL | Status: AC
  Administered 2017-12-19: 1000 mg via ORAL
  Filled 2017-12-19: qty 2

## 2017-12-19 MED ORDER — LORAZEPAM 2 MG/ML IJ SOLN
1.0000 mg | Freq: Once | INTRAMUSCULAR | Status: AC
  Administered 2017-12-19: 1 mg via INTRAVENOUS
  Filled 2017-12-19: qty 1

## 2017-12-19 MED ORDER — DIAZEPAM 5 MG PO TABS: 5.0000 mg | ORAL_TABLET | Freq: Two times a day (BID) | ORAL | 0 refills | Status: DC | PRN

## 2017-12-19 MED ORDER — DIPHENHYDRAMINE HCL 50 MG/ML IJ SOLN
12.5000 mg | Freq: Once | INTRAMUSCULAR | Status: AC
  Administered 2017-12-19: 12.5 mg via INTRAVENOUS
  Filled 2017-12-19: qty 1

## 2017-12-19 MED ORDER — SODIUM CHLORIDE 0.9 % IV BOLUS
500.0000 mL | Freq: Once | INTRAVENOUS | Status: AC
  Administered 2017-12-19: 500 mL via INTRAVENOUS

## 2017-12-19 MED ORDER — METOCLOPRAMIDE HCL 5 MG/ML IJ SOLN
5.0000 mg | Freq: Once | INTRAMUSCULAR | Status: AC
  Administered 2017-12-19: 5 mg via INTRAVENOUS
  Filled 2017-12-19: qty 2

## 2017-12-19 MED ORDER — POTASSIUM CHLORIDE CRYS ER 20 MEQ PO TBCR
40.0000 meq | EXTENDED_RELEASE_TABLET | Freq: Once | ORAL | Status: AC
  Administered 2017-12-19: 40 meq via ORAL
  Filled 2017-12-19: qty 2

## 2017-12-19 NOTE — Discharge Instructions (Signed)
It is very important that you keep taking your medications as prescribed.  Follow-up with your primary doctor in the next week.

## 2017-12-19 NOTE — ED Triage Notes (Signed)
Pt endorses having dizzy spells x 2 weeks worse with position changes and a constant headache x 2 days. No neuro deficits. VSS. Denies CP or shob.

## 2017-12-19 NOTE — Progress Notes (Signed)
4/13 @ 1911. Pt claustrophobic, refused to continue scan. Became agitated and started thrashing in scanner.

## 2017-12-19 NOTE — Progress Notes (Signed)
4/13 @ 1626, Pt refusing MRI unless under anesthesia per RN.

## 2017-12-19 NOTE — ED Notes (Signed)
Pt ambulated back to room. Gait was steady and pt wasn't experiencing any dizziness when asked.

## 2017-12-19 NOTE — ED Provider Notes (Signed)
[facility] EMERGENCY DEPARTMENT Provider Note   CSN: [id number] Arrival date & time: 12/19/17  1155     History   Chief Complaint Chief Complaint  Patient presents with  . Dizziness  . Headache    HPI [name] [name] is a 77 y.o. male.  HPI 77 year old male with extensive past medical history as below including known hypertension, hyperlipidemia, peripheral vascular disease, coronary disease, here with intermittent dizziness.  The patient symptoms have been ongoing for the last several days.  He states that when he sits up or moves his head, he has transient onset of a dizzy, room spinning sensation.  Over the last 2 days, he is also had gradual onset of progressive worsening aching, throbbing, left-sided headache behind his ear.  He has not had any tinnitus.  He has had no vomiting.  He has not had any focal numbness or weakness.  Patient has had no history of similar symptoms in the past.  Is been taking his medications as prescribed.  Of note, he does have a history of stroke.  Symptoms seem worse with position changes and are transient, last less than 1-2 minutes.  No alleviating factors.  Past Medical History:  Diagnosis Date  . Abdominal pain, unspecified site 10/16/2009  . Anal fissure 04/24/2008  . Arthritis   . B12 deficiency 10/11/2014  . BACK PAIN 01/10/2009  . Barrett's esophagus 1997  . BARRETTS ESOPHAGUS 08/16/2007  . BREAST HYPERTROPHY 02/16/2009  . CARBUNCLE, NECK 02/14/2008  . Cataract   . DIVERTICULOSIS, COLON 06/28/2007  . Full dentures   . GERD 06/28/2007  . GLUCOSE INTOLERANCE 08/15/2008  . Headache(784.0) 11/13/2009  . HERNIA, UMBILICAL W/OBSTRUCTION W/O GANGRENE 06/28/2007  . HTN (hypertension) 07/12/2012  . HYPERLIPIDEMIA 06/28/2007  . HYPERTENSION 06/28/2007  . Impaired glucose tolerance 04/13/2011  . INSOMNIA-SLEEP DISORDER-UNSPEC 10/10/2008  . LEG PAIN, BILATERAL 02/16/2009  . LOW BACK PAIN 06/28/2007  . MASTITIS 01/10/2009  . NECK PAIN 05/31/2008   . NEPHROLITHIASIS, HX OF 06/28/2007  . PROCTOSIGMOIDITIS, ULCERATIVE 1997  . PVD (peripheral vascular disease) with claudication, lifestyle limiting, ABI rt. 0.67, lt. 0.73 07/12/2012  . S/P angioplasty with stent, after DB athrectomy to Rt. ext. iliac and Rt. SFA 07/12/2012  . Skin cancer    right hand  . Spinal stenosis at L4-L5 level   . Stenosis of left carotid artery, mod to severe by dopplers 09/29/2012  . TESTICULAR PAIN, RIGHT 10/25/2010  . VITAMIN D DEFICIENCY 11/13/2009    Patient Active Problem List   Diagnosis Date Noted  . Spinal stenosis of lumbar region 11/05/2016  . Spinal stenosis at L4-L5 level 11/05/2016  . Mastalgia 04/10/2016  . B12 deficiency 10/11/2014  . Bilateral leg pain 10/10/2014  . Double vision with both eyes open 10/13/2013  . Stenosis of left carotid artery, mod to severe by dopplers 09/29/2012  . S/P angioplasty with stent, and [place] athrectomy mid lt. SFA for life style limiting claudication 09/28/12 09/29/2012  . PVD (peripheral vascular disease) with claudication, lifestyle limiting, ABI rt. 0.67, lt. 0.73 07/12/2012  . Thromboembolism, distal  Rt SFA  treated with aspiration thrombectomy, 07/12/12 07/12/2012  . S/P insertion of iliac artery stent, external iliac and RT SFA  with DB athrectomy 07/12/12 07/12/2012  . Impaired glucose tolerance 04/13/2011  . Preventative health care 04/13/2011  . Chronic universal ulcerative colitis ([place]) 11/14/2009  . VITAMIN D DEFICIENCY 11/13/2009  . Depression 10/16/2009  . SHOULDER PAIN, LEFT 10/16/2009  . HIP PAIN, RIGHT 10/16/2009  .  Gynecomastia 02/16/2009  . BACK PAIN 01/10/2009  . INSOMNIA-SLEEP DISORDER-UNSPEC 10/10/2008  . Chest pain 10/10/2008  . NECK PAIN 05/31/2008  . Hx of ulcerative colitis 04/24/2008  . Anal fissure 04/24/2008  . Personal history of colonic polyps 04/20/2008  . CARBUNCLE, NECK 02/14/2008  . BARRETTS ESOPHAGUS 08/16/2007  . Hyperlipidemia 06/28/2007  . Essential hypertension  06/28/2007  . GERD 06/28/2007  . DIVERTICULOSIS, COLON 06/28/2007  . Lower back pain 06/28/2007  . NEPHROLITHIASIS, HX OF 06/28/2007    Past Surgical History:  Procedure Laterality Date  . anal fissure surgury     pt unsure of this  . ATHERECTOMY  09/28/2012  . ATHERECTOMY N/A 07/12/2012   Procedure: ATHERECTOMY;  Surgeon: [name], MD;  Location[facility] C[facility] LAB;  Service: Cardiovascular;  Laterality: N/A;  . ATHERECTOMY N/A 09/28/2012   Procedure: ATHERECTOMY;  Surgeon: [name], MD;  Location: [facility] CATH LAB;  Service: Cardiovascular;  Laterality: N/A;  . bilateral inguinal hernia with mesh and umbilical hernia repairs  09/2007  . CATARACT EXTRACTION Bilateral    bilateral  . COLONOSCOPY    . GROIN DISSECTION Right 07/20/2013   Procedure: GROIN EXPLORATION, EXCISIONAL BIOPSY RIGHT INGUINAL LYMPH NODES;  Surgeon: Harl Bowie, MD;  Location: [facility];  Service: General;  Laterality: Right;  . HERNIA REPAIR    . left knee arthroscopy    . LUMBAR LAMINECTOMY/DECOMPRESSION MICRODISCECTOMY N/A 11/05/2016   Procedure: Microlumbar decompression L2-3, L3-4, L4-5, lateral mass fusion L4-5;  Surgeon: Susa Day, MD;  Location: [facility];  Service: Orthopedics;  Laterality: N/A;  . PERCUTANEOUS STENT INTERVENTION  09/28/2012   Procedure: PERCUTANEOUS STENT INTERVENTION;  Surgeon: [name], MD;  Location: [facility] CATH LAB;  Service: Cardiovascular;;  . UPPER GI ENDOSCOPY          Home Medications    Prior to Admission medications   Medication Sig Start Date End Date Taking? Authorizing Provider  clopidogrel (PLAVIX) 75 MG tablet TAKE 1 TABLET BY MOUTH  DAILY 03/04/17   Biagio Borg, MD  cyanocobalamin 1000 MCG tablet Take 1,000 mcg by mouth daily. On hold for procedure    [provider]  diazepam (VALIUM) 5 MG tablet Take 1 tablet (5 mg total) by mouth every 12 (twelve) hours as needed (severe vertigo). 12/19/17   Duffy Bruce, MD  docusate  sodium (COLACE) 100 MG capsule Take 1 capsule (100 mg total) by mouth 2 (two) times daily as needed for mild constipation. 11/05/16   Susa Day, MD  ezetimibe (ZETIA) 10 MG tablet TAKE 1 TABLET BY MOUTH  DAILY 10/13/16   [name], MD  LIALDA 1.2 g EC tablet TAKE 2 TABLETS BY MOUTH  DAILY WITH BREAKFAST 07/06/17   Ladene Artist, MD  lisinopril-hydrochlorothiazide (PRINZIDE,ZESTORETIC) 20-25 MG tablet TAKE 1 TABLET BY MOUTH  DAILY 05/12/17   Biagio Borg, MD  Loperamide HCl (IMODIUM PO) Take by mouth. As needed    [provider]  meclizine (ANTIVERT) 12.5 MG tablet Take 1 tablet (12.5 mg total) by mouth 3 (three) times daily as needed for dizziness. 12/19/17   Duffy Bruce, MD  pantoprazole (PROTONIX) 40 MG tablet Take 1 tablet (40 mg total) by mouth daily. 07/06/17   Ladene Artist, MD  zolpidem (AMBIEN CR) 12.5 MG CR tablet TAKE 1 TABLET BY MOUTH AT BEDTIME AS NEEDED FOR SLEEP 10/12/17   Biagio Borg, MD    Family History Family History  Problem Relation Age of Onset  .  COPD Mother   . Cerebral aneurysm Mother   . COPD Father   . Colon cancer Neg Hx   . Esophageal cancer Neg Hx   . Rectal cancer Neg Hx   . Stomach cancer Neg Hx     Social History Social History   Tobacco Use  . Smoking status: Former Smoker    Last attempt to quit: 09/08/1972    Years since quitting: 45.3  . Smokeless tobacco: Current User    Types: Chew  Substance Use Topics  . Alcohol use: Yes    Alcohol/week: 3.0 oz    Types: 5 Cans of beer per week    Comment: 1 TO 2 BEERS PER DAY  . Drug use: No     Allergies   Contrast media [iodinated diagnostic agents]; Lipitor [atorvastatin]; Lovastatin; and Statins   Review of Systems Review of Systems  Constitutional: Negative for chills, fatigue and fever.  HENT: Negative for congestion and rhinorrhea.   Eyes: Negative for visual disturbance.  Respiratory: Negative for cough, shortness of breath and wheezing.   Cardiovascular:  Negative for chest pain and leg swelling.  Gastrointestinal: Negative for abdominal pain, diarrhea, nausea and vomiting.  Genitourinary: Negative for dysuria and flank pain.  Musculoskeletal: Negative for neck pain and neck stiffness.  Skin: Negative for rash and wound.  Allergic/Immunologic: Negative for immunocompromised state.  Neurological: Positive for dizziness and headaches. Negative for syncope and weakness.  All other systems reviewed and are negative.    Physical Exam Updated Vital Signs BP (!) 157/91   Pulse 68   Temp 98.9 F (37.2 C)   Resp 14   Ht 5' 8"  (1.727 m)   Wt 104.3 kg (230 lb)   SpO2 98%   BMI 34.97 kg/m   Physical Exam  Constitutional: He is oriented to person, place, and time. He appears well-developed and well-nourished. No distress.  HENT:  Head: Normocephalic and atraumatic.  Eyes: Conjunctivae are normal.  No effusion of the tympanic membranes below.  No mastoid erythema or tenderness.  No asymmetry.  Neck: Neck supple.  Cardiovascular: Normal rate, regular rhythm and normal heart sounds. Exam reveals no friction rub.  No murmur heard. Pulmonary/Chest: Effort normal and breath sounds normal. No respiratory distress. He has no wheezes. He has no rales.  Abdominal: He exhibits no distension.  Musculoskeletal: He exhibits no edema.  Neurological: He is alert and oriented to person, place, and time. He exhibits normal muscle tone.  Skin: Skin is warm. Capillary refill takes less than 2 seconds.  Psychiatric: He has a normal mood and affect.  Nursing note and vitals reviewed.   Neurological Exam:  Mental Status: Alert and oriented to person, place, and time. Attention and concentration normal. Speech clear. Recent memory is intact. Cranial Nerves: Visual fields grossly intact. EOMI and PERRLA. No nystagmus noted. Facial sensation intact at forehead, maxillary cheek, and chin/mandible bilaterally. No facial asymmetry or weakness. Hearing grossly  normal. Uvula is midline, and palate elevates symmetrically. Normal SCM and trapezius strength. Tongue midline without fasciculations. Motor: Muscle strength 5/5 in proximal and distal UE and LE bilaterally. No pronator drift. Muscle tone normal. Reflexes: 2+ and symmetrical in all four extremities.  Sensation: Intact to light touch in upper and lower extremities distally bilaterally.  Gait: Normal without ataxia. Coordination: Normal FTN bilaterally.     ED Treatments / Results  Labs (all labs ordered are listed, but only abnormal results are displayed) Labs Reviewed  BASIC METABOLIC PANEL - Abnormal; Notable for  the following components:      Result Value   Potassium 3.4 (*)    Glucose, Bld 121 (*)    All other components within normal limits  CBC  I-STAT TROPONIN, ED    EKG EKG Interpretation  Date/Time:  Saturday December 19 2017 12:07:23 EDT Ventricular Rate:  79 PR Interval:  140 QRS Duration: 78 QT Interval:  374 QTC Calculation: 428 R Axis:   -27 Text Interpretation:  Normal sinus rhythm Normal ECG No significant change since last tracing Confirmed by Duffy Bruce [id number]) on 12/19/2017 3:52:13 PM   Radiology Ct Head Wo Contrast  Result Date: 12/19/2017 CLINICAL DATA:  Acute severe headache since yesterday, worst of life EXAM: CT HEAD WITHOUT CONTRAST TECHNIQUE: Contiguous axial images were obtained from the base of the skull through the vertex without intravenous contrast. COMPARISON:  None; correlation MR brain 10/26/2013 FINDINGS: Brain: Generalized atrophy. Normal ventricular morphology. No midline shift or mass effect. Small vessel chronic ischemic changes of deep cerebral white matter. Old infarct at lateral LEFT frontal region. No intracranial hemorrhage, mass lesion, evidence of acute infarction, or extra-axial fluid collection. Vascular: Atherosclerotic calcifications of internal carotid arteries at skull base. No hyperdense vessels. Skull: Intact Sinuses/Orbits:  Clear Other: N/A IMPRESSION: Atrophy with small vessel chronic ischemic changes of deep cerebral white matter. Old infarct lateral LEFT frontal lobe. No acute intracranial abnormalities. Electronically Signed   By: Lavonia Dana M.D.   On: 12/19/2017 12:42   Mr Brain Wo Contrast  Result Date: 12/19/2017 CLINICAL DATA:  Episodic, peripheral vertigo. EXAM: MRI HEAD WITHOUT CONTRAST TECHNIQUE: Multiplanar, multiecho pulse sequences of the brain and surrounding structures were obtained without intravenous contrast. COMPARISON:  Head CT from earlier today. Brain MRI and MRA 10/26/2013 FINDINGS: Brain: Partial and motion degraded study. Only diffusion, T2, and thin slice posterior fossa Fiesta imaging could be acquired. Diffusion imaging is diagnostic and negative for acute infarct. There is chronic small vessel ischemic change in the cerebral white matter and pons without noted progression. Remote lateral left frontal cortical and subcortical infarct. No evidence of hemorrhage, hydrocephalus, or collection. Thin-section imaging is negative for asymmetric or abnormal labyrinthine signal. No mass is seen along the vestibulocochlear nerve complexes. Vascular: Loss of left vertebral flow void at the V4 segment, new from 2015 Skull and upper cervical spine: Negative marrow space. Sinuses/Orbits: Bilateral cataract resection. IMPRESSION: 1. Partial exam due to patient condition. 2. Absent or slow flow in the left vertebral artery, new from 2015. 3. Negative for acute infarct. 4. Moderate chronic small vessel ischemic change. Remote left frontal infarct. 5. Thin-section imaging of the internal auditory canals was acquired and is negative for mass or other asymmetry. Electronically Signed   By: Monte Fantasia M.D.   On: 12/19/2017 19:28    Procedures Procedures (including critical care time)  Medications Ordered in ED Medications  LORazepam (ATIVAN) injection 1 mg (1 mg Intravenous Given 12/19/17 1608)  metoCLOPramide  (REGLAN) injection 5 mg (5 mg Intravenous Given 12/19/17 1608)  diphenhydrAMINE (BENADRYL) injection 12.5 mg (12.5 mg Intravenous Given 12/19/17 1609)  acetaminophen (TYLENOL) tablet 1,000 mg (1,000 mg Oral Given 12/19/17 1607)  sodium chloride 0.9 % bolus 500 mL (0 mLs Intravenous Stopped 12/19/17 1735)  LORazepam (ATIVAN) injection 2 mg (2 mg Intravenous Given 12/19/17 1808)  potassium chloride SA (K-DUR,KLOR-CON) CR tablet 40 mEq (40 mEq Oral Given 12/19/17 2009)     Initial Impression / Assessment and Plan / ED Course  I have reviewed the triage vital signs  and the nursing notes.  Pertinent labs & imaging results that were available during my care of the patient were reviewed by me and considered in my medical decision making (see chart for details).     77 year old male here with left-sided headache and dizziness.  Dizziness is reproducible with head position changes and he has unidirectional nystagmus only.  History is consistent with possible peripheral vertigo, though given his age and risk factors, will obtain an MRI.  He has no dysphagia, dysarthria, or other symptoms to suggest cerebellar abnormality.  Regarding his headache, this is unclear etiology.  He does have some tinnitus in his left ear and this may be related to a possible vestibulopathy.  CT head negative.  Headache actually began gradually, is progressively worsening, is not consistent with subarachnoid.  Will follow-up MRI.  MRI shows no acute stroke, mass, or other abnormality. Incidentally, pt has decreased flow L vert. Given that this is his area of pain, discussed with Dr. Lorraine Lax. Likely old PVD but treatment at thsi point would be ASA/Plavix, which pt is already on. He states he only takes ASA intermittently - discussed strict importance of this, particulrly acutely. Otherwise, no apparent emergent need for hospitalization or work-up per labs, vitals, and discussion with Neuro. Will treat, d/c with outpt follow-up.  Final  Clinical Impressions(s) / ED Diagnoses   Final diagnoses:  Vertigo  Abnormal MRI  Vertebral artery narrowing, left    ED Discharge Orders        Ordered    meclizine (ANTIVERT) 12.5 MG tablet  3 times daily PRN     12/19/17 1958    diazepam (VALIUM) 5 MG tablet  Every 12 hours PRN     12/19/17 1958       Duffy Bruce, MD 12/19/17 2041

## 2017-12-19 NOTE — ED Notes (Signed)
Patient transported to MRI 

## 2017-12-21 ENCOUNTER — Other Ambulatory Visit: Payer: Self-pay | Admitting: Internal Medicine

## 2017-12-28 ENCOUNTER — Other Ambulatory Visit (INDEPENDENT_AMBULATORY_CARE_PROVIDER_SITE_OTHER): Payer: Medicare Other

## 2017-12-28 DIAGNOSIS — Z0000 Encounter for general adult medical examination without abnormal findings: Secondary | ICD-10-CM | POA: Diagnosis not present

## 2017-12-28 DIAGNOSIS — R7302 Impaired glucose tolerance (oral): Secondary | ICD-10-CM | POA: Diagnosis not present

## 2017-12-28 LAB — PSA: PSA: 1.45 ng/mL (ref 0.10–4.00)

## 2017-12-28 LAB — HEPATIC FUNCTION PANEL
ALK PHOS: 47 U/L (ref 39–117)
ALT: 16 U/L (ref 0–53)
AST: 15 U/L (ref 0–37)
Albumin: 4 g/dL (ref 3.5–5.2)
BILIRUBIN TOTAL: 0.8 mg/dL (ref 0.2–1.2)
Bilirubin, Direct: 0.2 mg/dL (ref 0.0–0.3)
Total Protein: 6.2 g/dL (ref 6.0–8.3)

## 2017-12-28 LAB — URINALYSIS, ROUTINE W REFLEX MICROSCOPIC
BILIRUBIN URINE: NEGATIVE
Hgb urine dipstick: NEGATIVE
KETONES UR: NEGATIVE
Leukocytes, UA: NEGATIVE
NITRITE: NEGATIVE
PH: 6 (ref 5.0–8.0)
Specific Gravity, Urine: 1.02 (ref 1.000–1.030)
Total Protein, Urine: NEGATIVE
Urine Glucose: NEGATIVE
Urobilinogen, UA: 1 (ref 0.0–1.0)

## 2017-12-28 LAB — BASIC METABOLIC PANEL
BUN: 14 mg/dL (ref 6–23)
CALCIUM: 9 mg/dL (ref 8.4–10.5)
CO2: 28 meq/L (ref 19–32)
Chloride: 98 mEq/L (ref 96–112)
Creatinine, Ser: 0.93 mg/dL (ref 0.40–1.50)
GFR: 83.84 mL/min (ref >60.00)
Glucose, Bld: 124 mg/dL — ABNORMAL HIGH (ref 70–99)
POTASSIUM: 3.6 meq/L (ref 3.5–5.1)
Sodium: 136 mEq/L (ref 135–145)

## 2017-12-28 LAB — CBC WITH DIFFERENTIAL/PLATELET
BASOS ABS: 0 10*3/uL (ref 0.0–0.1)
BASOS PCT: 0.6 % (ref 0.0–3.0)
EOS ABS: 0.2 10*3/uL (ref 0.0–0.7)
Eosinophils Relative: 4 % (ref 0.0–5.0)
HCT: 46.7 % (ref 39.0–52.0)
HEMOGLOBIN: 16.4 g/dL (ref 13.0–17.0)
LYMPHS PCT: 12.4 % (ref 12.0–46.0)
Lymphs Abs: 0.8 10*3/uL (ref 0.7–4.0)
MCHC: 35.2 g/dL (ref 30.0–36.0)
MCV: 85.6 fl (ref 78.0–100.0)
MONO ABS: 0.5 10*3/uL (ref 0.1–1.0)
Monocytes Relative: 8.7 % (ref 3.0–12.0)
Neutro Abs: 4.6 10*3/uL (ref 1.4–7.7)
Neutrophils Relative %: 74.3 % (ref 43.0–77.0)
PLATELETS: 190 10*3/uL (ref 150.0–400.0)
RBC: 5.45 Mil/uL (ref 4.22–5.81)
RDW: 14.3 % (ref 11.5–15.5)
WBC: 6.2 10*3/uL (ref 4.0–10.5)

## 2017-12-28 LAB — HEMOGLOBIN A1C: Hgb A1c MFr Bld: 5.9 % (ref 4.6–6.5)

## 2017-12-28 LAB — LIPID PANEL
CHOLESTEROL: 168 mg/dL (ref 0–200)
HDL: 32 mg/dL — AB (ref >39.00)
LDL Cholesterol: 112 mg/dL — ABNORMAL HIGH (ref 0–99)
NonHDL: 135.93
TRIGLYCERIDES: 119 mg/dL (ref 0.0–149.0)
Total CHOL/HDL Ratio: 5
VLDL: 23.8 mg/dL (ref 0.0–40.0)

## 2017-12-28 LAB — TSH: TSH: 1.3 u[IU]/mL (ref 0.35–4.50)

## 2017-12-30 ENCOUNTER — Encounter: Payer: Self-pay | Admitting: Internal Medicine

## 2017-12-30 ENCOUNTER — Ambulatory Visit (INDEPENDENT_AMBULATORY_CARE_PROVIDER_SITE_OTHER): Payer: Medicare Other | Admitting: Internal Medicine

## 2017-12-30 VITALS — BP 114/64 | HR 78 | Temp 97.8°F | Ht 68.0 in | Wt 228.0 lb

## 2017-12-30 DIAGNOSIS — Z0000 Encounter for general adult medical examination without abnormal findings: Secondary | ICD-10-CM | POA: Diagnosis not present

## 2017-12-30 DIAGNOSIS — R7302 Impaired glucose tolerance (oral): Secondary | ICD-10-CM | POA: Diagnosis not present

## 2017-12-30 MED ORDER — ZOLPIDEM TARTRATE ER 12.5 MG PO TBCR: 12.5000 mg | EXTENDED_RELEASE_TABLET | Freq: Every evening | ORAL | 5 refills | Status: DC | PRN

## 2017-12-30 NOTE — Progress Notes (Signed)
Subjective:    Patient ID: [name] [name], male    DOB: 06-Mar-1941, 77 y.o.   MRN: [id number]  HPI  Here for wellness and f/u;  Overall doing ok;  Pt denies Chest pain, worsening SOB, DOE, wheezing, orthopnea, PND, worsening LE edema, palpitations, dizziness or syncope.  Pt denies neurological change such as new headache, facial or extremity weakness.  Pt denies polydipsia, polyuria, or low sugar symptoms. Pt states overall good compliance with treatment and medications, good tolerability, and has been trying to follow appropriate diet.  Pt denies worsening depressive symptoms, suicidal ideation or panic. No fever, night sweats, wt loss, loss of appetite, or other constitutional symptoms.  Pt states good ability with ADL's, has low fall risk, home safety reviewed and adequate, no other significant changes in hearing or vision, and only occasionally active with exercise.  Statin intolerant. No other new complaints Past Medical History:  Diagnosis Date  . Abdominal pain, unspecified site 10/16/2009  . Anal fissure 04/24/2008  . Arthritis   . B12 deficiency 10/11/2014  . BACK PAIN 01/10/2009  . Barrett's esophagus 1997  . BARRETTS ESOPHAGUS 08/16/2007  . BREAST HYPERTROPHY 02/16/2009  . CARBUNCLE, NECK 02/14/2008  . Cataract   . DIVERTICULOSIS, COLON 06/28/2007  . Full dentures   . GERD 06/28/2007  . GLUCOSE INTOLERANCE 08/15/2008  . Headache(784.0) 11/13/2009  . HERNIA, UMBILICAL W/OBSTRUCTION W/O GANGRENE 06/28/2007  . HTN (hypertension) 07/12/2012  . HYPERLIPIDEMIA 06/28/2007  . HYPERTENSION 06/28/2007  . Impaired glucose tolerance 04/13/2011  . INSOMNIA-SLEEP DISORDER-UNSPEC 10/10/2008  . LEG PAIN, BILATERAL 02/16/2009  . LOW BACK PAIN 06/28/2007  . MASTITIS 01/10/2009  . NECK PAIN 05/31/2008  . NEPHROLITHIASIS, HX OF 06/28/2007  . PROCTOSIGMOIDITIS, ULCERATIVE 1997  . PVD (peripheral vascular disease) with claudication, lifestyle limiting, ABI rt. 0.67, lt. 0.73 07/12/2012  . S/P angioplasty with  stent, after DB athrectomy to Rt. ext. iliac and Rt. SFA 07/12/2012  . Skin cancer    right hand  . Spinal stenosis at L4-L5 level   . Stenosis of left carotid artery, mod to severe by dopplers 09/29/2012  . TESTICULAR PAIN, RIGHT 10/25/2010  . VITAMIN D DEFICIENCY 11/13/2009   Past Surgical History:  Procedure Laterality Date  . anal fissure surgury     pt unsure of this  . ATHERECTOMY  09/28/2012  . ATHERECTOMY N/A 07/12/2012   Procedure: ATHERECTOMY;  Surgeon: [name], MD;  Location[facility] CATH LAB;  Service: Cardiovascular;  Laterality: N/A;  . ATHERECTOMY N/A 09/28/2012   Procedure: ATHERECTOMY;  Surgeon: [name], MD;  Location: [facility] CATH LAB;  Service: Cardiovascular;  Laterality: N/A;  . bilateral inguinal hernia with mesh and umbilical hernia repairs  09/2007  . CATARACT EXTRACTION Bilateral    bilateral  . COLONOSCOPY    . GROIN DISSECTION Right 07/20/2013   Procedure: GROIN EXPLORATION, EXCISIONAL BIOPSY RIGHT INGUINAL LYMPH NODES;  Surgeon: Harl Bowie, MD;  Location: [place];  Service: General;  Laterality: Right;  . HERNIA REPAIR    . left knee arthroscopy    . LUMBAR LAMINECTOMY/DECOMPRESSION MICRODISCECTOMY N/A 11/05/2016   Procedure: Microlumbar decompression L2-3, L3-4, L4-5, lateral mass fusion L4-5;  Surgeon: Susa Day, MD;  Location: [facility];  Service: Orthopedics;  Laterality: N/A;  . PERCUTANEOUS STENT INTERVENTION  09/28/2012   Procedure: PERCUTANEOUS STENT INTERVENTION;  Surgeon: [name], MD;  Location: [facility] CATH LAB;  Service: Cardiovascular;;  . UPPER GI ENDOSCOPY      reports that he  quit smoking about 45 years ago. His smokeless tobacco use includes chew. He reports that he drinks about 3.0 oz of alcohol per week. He reports that he does not use drugs. family history includes COPD in his father and mother; Cerebral aneurysm in his mother. Allergies  Allergen Reactions  . Contrast Media [Iodinated Diagnostic Agents]       Break out in welts  . Lipitor [Atorvastatin] Other (See Comments)    myalgias  . Lovastatin     Muscle pain  . Statins     Legs pain   Current Outpatient Medications on File Prior to Visit  Medication Sig Dispense Refill  . clopidogrel (PLAVIX) 75 MG tablet TAKE 1 TABLET BY MOUTH  DAILY 90 tablet 3  . cyanocobalamin 1000 MCG tablet Take 1,000 mcg by mouth daily. On hold for procedure    . diazepam (VALIUM) 5 MG tablet Take 1 tablet (5 mg total) by mouth every 12 (twelve) hours as needed (severe vertigo). 10 tablet 0  . docusate sodium (COLACE) 100 MG capsule Take 1 capsule (100 mg total) by mouth 2 (two) times daily as needed for mild constipation. 30 capsule 1  . ezetimibe (ZETIA) 10 MG tablet TAKE 1 TABLET BY MOUTH  DAILY 90 tablet 1  . LIALDA 1.2 g EC tablet TAKE 2 TABLETS BY MOUTH  DAILY WITH BREAKFAST 180 tablet 12  . lisinopril-hydrochlorothiazide (PRINZIDE,ZESTORETIC) 20-25 MG tablet TAKE 1 TABLET BY MOUTH  DAILY 90 tablet 0  . Loperamide HCl (IMODIUM PO) Take by mouth. As needed    . meclizine (ANTIVERT) 12.5 MG tablet Take 1 tablet (12.5 mg total) by mouth 3 (three) times daily as needed for dizziness. 30 tablet 0  . pantoprazole (PROTONIX) 40 MG tablet Take 1 tablet (40 mg total) by mouth daily. 30 tablet 12   No current facility-administered medications on file prior to visit.    Review of Systems Constitutional: Negative for other unusual diaphoresis, sweats, appetite or weight changes HENT: Negative for other worsening hearing loss, ear pain, facial swelling, mouth sores or neck stiffness.   Eyes: Negative for other worsening pain, redness or other visual disturbance.  Respiratory: Negative for other stridor or swelling Cardiovascular: Negative for other palpitations or other chest pain  Gastrointestinal: Negative for worsening diarrhea or loose stools, blood in stool, distention or other pain Genitourinary: Negative for hematuria, flank pain or other change in urine  volume.  Musculoskeletal: Negative for myalgias or other joint swelling.  Skin: Negative for other color change, or other wound or worsening drainage.  Neurological: Negative for other syncope or numbness. Hematological: Negative for other adenopathy or swelling Psychiatric/Behavioral: Negative for hallucinations, other worsening agitation, SI, self-injury, or new decreased concentration All other system neg per pt    Objective:   Physical Exam BP 114/64   Pulse 78   Temp 97.8 F (36.6 C) (Oral)   Ht 5' 8"  (1.727 m)   Wt 228 lb (103.4 kg)   SpO2 97%   BMI 34.67 kg/m  VS noted, obese Constitutional: Pt is oriented to person, place, and time. Appears well-developed and well-nourished, in no significant distress and comfortable Head: Normocephalic and atraumatic  Eyes: Conjunctivae and EOM are normal. Pupils are equal, round, and reactive to light Right Ear: External ear normal without discharge Left Ear: External ear normal without discharge Nose: Nose without discharge or deformity Mouth/Throat: Oropharynx is without other ulcerations and moist  Neck: Normal range of motion. Neck supple. No JVD present. No tracheal deviation present  or significant neck LA or mass Cardiovascular: Normal rate, regular rhythm, normal heart sounds and intact distal pulses.   Pulmonary/Chest: WOB normal and breath sounds without rales or wheezing  Abdominal: Soft. Bowel sounds are normal. NT. No HSM  Musculoskeletal: Normal range of motion. Exhibits no edema Lymphadenopathy: Has no other cervical adenopathy.  Neurological: Pt is alert and oriented to person, place, and time. Pt has normal reflexes. No cranial nerve deficit. Motor grossly intact, Gait intact Skin: Skin is warm and dry. No rash noted or new ulcerations Psychiatric:  Has normal mood and affect. Behavior is normal without agitation No other exam findings    Assessment & Plan:

## 2017-12-30 NOTE — Assessment & Plan Note (Signed)
Lab Results  Component Value Date   HGBA1C 5.9 12/28/2017  stable overall by history and exam, recent data reviewed with pt, and pt to continue medical treatment as before,  to f/u any worsening symptoms or concerns

## 2017-12-30 NOTE — Assessment & Plan Note (Signed)

## 2017-12-30 NOTE — Patient Instructions (Addendum)
Please continue all other medications as before, and refills have been done if requested - the ambien CR  Please have the pharmacy call with any other refills you may need.  Please continue your efforts at being more active, low cholesterol diet, and weight control.  You are otherwise up to date with prevention measures today.  Please keep your appointments with your specialists as you may have planned  Please return in 1 year for your yearly visit, or sooner if needed, with Lab testing done 3-5 days before

## 2018-01-01 ENCOUNTER — Ambulatory Visit: Payer: Medicare Other | Admitting: Gastroenterology

## 2018-01-01 ENCOUNTER — Encounter: Payer: Self-pay | Admitting: Gastroenterology

## 2018-01-01 VITALS — BP 136/60 | HR 80 | Ht 68.0 in | Wt 228.4 lb

## 2018-01-01 DIAGNOSIS — K2270 Barrett's esophagus without dysplasia: Secondary | ICD-10-CM | POA: Diagnosis not present

## 2018-01-01 DIAGNOSIS — K5100 Ulcerative (chronic) pancolitis without complications: Secondary | ICD-10-CM

## 2018-01-01 NOTE — Progress Notes (Signed)
    History of Present Illness: This is a 77 year old male returning for follow-up of ulcerative colitis and Barrett's esophagus.  He has intermittent mild diarrhea that appears to be functional and is easily controlled with as needed Imodium.  No other gastrointestinal complaints.  He understands he is due for Barrett's surveillance but he strongly prefers to have both colonoscopy and EGD done at the same time next year when his colonoscopy is due.  He does not want to proceed with endoscopy this year.  He relates that his daughter is the only person who can bring him for procedures and that she works in the [employer] system so it is difficult for her to have time off except during the summers.  Blood work performed April 22 reviewed.  Current Medications, Allergies, Past Medical History, Past Surgical History, Family History and Social History were reviewed in [employer] record.  Physical Exam: General: Well developed, well nourished, no acute distress Head: Normocephalic and atraumatic Eyes:  sclerae anicteric, EOMI Ears: Normal auditory acuity Mouth: No deformity or lesions Lungs: Clear throughout to auscultation Heart: Regular rate and rhythm; no murmurs, rubs or bruits Abdomen: Soft, non tender and non distended. No masses, hepatosplenomegaly or hernias noted. Normal Bowel sounds Rectal: not done Musculoskeletal: Symmetrical with no gross deformities  Pulses:  Normal pulses noted Extremities: No clubbing, cyanosis, edema or deformities noted Neurological: Alert oriented x 4, grossly nonfocal Psychological:  Alert and cooperative. Normal mood and affect  Assessment and Recommendations:  1.  Barrett's esophagus.  Continue pantoprazole 40 mg daily.  Patient declines endoscopy at this time.  He understands that delaying surveillance endoscopy for 1 more year would be a 5-year interval and that is beyond my previous recommendation however he insists on waiting  until the summer of 2020. Change his recall to May 2020 per his decision to allow time for him to plan and schedule colonoscopy/EGD during the summer of 2020. REV in 1 year.   2.  Ulcerative pancolitis.  Continue Lialda 2.4 g every morning.  Functional diarrhea.  Continue Imodium twice daily as needed.  Surveillance colonoscopy due May 2020.  REV in 1 year.   3. PVD with stent on Plavix.

## 2018-01-01 NOTE — Patient Instructions (Signed)
We have changed your recall Upper Endoscopy at the same time as your Colonoscopy in 01/2019. We will send you a reminder in the mail when it gets closer to that time.  Thank you for choosing me and [facility].  [name]. Dagoberto Ligas., MD., Marval Regal

## 2018-02-22 ENCOUNTER — Other Ambulatory Visit: Payer: Self-pay | Admitting: Internal Medicine

## 2018-03-17 ENCOUNTER — Ambulatory Visit (INDEPENDENT_AMBULATORY_CARE_PROVIDER_SITE_OTHER): Payer: Medicare Other | Admitting: Internal Medicine

## 2018-03-17 ENCOUNTER — Encounter: Payer: Self-pay | Admitting: Internal Medicine

## 2018-03-17 VITALS — BP 124/80 | HR 60 | Temp 98.3°F | Ht 68.0 in | Wt 229.0 lb

## 2018-03-17 DIAGNOSIS — I10 Essential (primary) hypertension: Secondary | ICD-10-CM

## 2018-03-17 DIAGNOSIS — M79604 Pain in right leg: Secondary | ICD-10-CM

## 2018-03-17 DIAGNOSIS — Z95820 Peripheral vascular angioplasty status with implants and grafts: Secondary | ICD-10-CM | POA: Diagnosis not present

## 2018-03-17 DIAGNOSIS — M79605 Pain in left leg: Secondary | ICD-10-CM

## 2018-03-17 DIAGNOSIS — I739 Peripheral vascular disease, unspecified: Secondary | ICD-10-CM | POA: Diagnosis not present

## 2018-03-17 DIAGNOSIS — R7302 Impaired glucose tolerance (oral): Secondary | ICD-10-CM | POA: Diagnosis not present

## 2018-03-17 MED ORDER — TRAMADOL HCL 50 MG PO TABS: 50.0000 mg | ORAL_TABLET | Freq: Three times a day (TID) | ORAL | 0 refills | Status: DC | PRN

## 2018-03-17 NOTE — Progress Notes (Signed)
Subjective:    Patient ID: [name] [name], male    DOB: Jul 19, 1941, 77 y.o.   MRN: [id number]  HPI  Here with c/o mod to severe intermittent chronic bilat calf pain, worse with just walking 50 yds to mailbox, can hardly get back to the house, very concerned b/c he and wife are now separated and he is on his own.  Has hx of PVD with stent, with last f/u vascular study 2016 with recommendation for f/u in 6 mo, has apparently missed this.  Had NCS/EMG 2017 as well neg for distal LE neuropathy, though did have evidenc for chronic l3-4 left radiculopathy.  Pt denies chest pain, increased sob or doe, wheezing, orthopnea, PND, increased LE swelling, palpitations, dizziness or syncope.   Pt denies polydipsia, polyuria Past Medical History:  Diagnosis Date  . Abdominal pain, unspecified site 10/16/2009  . Anal fissure 04/24/2008  . Arthritis   . B12 deficiency 10/11/2014  . BACK PAIN 01/10/2009  . Barrett's esophagus 1997  . BARRETTS ESOPHAGUS 08/16/2007  . BREAST HYPERTROPHY 02/16/2009  . CARBUNCLE, NECK 02/14/2008  . Cataract   . DIVERTICULOSIS, COLON 06/28/2007  . Full dentures   . GERD 06/28/2007  . GLUCOSE INTOLERANCE 08/15/2008  . Headache(784.0) 11/13/2009  . HERNIA, UMBILICAL W/OBSTRUCTION W/O GANGRENE 06/28/2007  . HTN (hypertension) 07/12/2012  . HYPERLIPIDEMIA 06/28/2007  . HYPERTENSION 06/28/2007  . Impaired glucose tolerance 04/13/2011  . INSOMNIA-SLEEP DISORDER-UNSPEC 10/10/2008  . LEG PAIN, BILATERAL 02/16/2009  . LOW BACK PAIN 06/28/2007  . MASTITIS 01/10/2009  . NECK PAIN 05/31/2008  . NEPHROLITHIASIS, HX OF 06/28/2007  . PROCTOSIGMOIDITIS, ULCERATIVE 1997  . PVD (peripheral vascular disease) with claudication, lifestyle limiting, ABI rt. 0.67, lt. 0.73 07/12/2012  . S/P angioplasty with stent, after DB athrectomy to Rt. ext. iliac and Rt. SFA 07/12/2012  . Skin cancer    right hand  . Spinal stenosis at L4-L5 level   . Stenosis of left carotid artery, mod to severe by dopplers 09/29/2012  .  TESTICULAR PAIN, RIGHT 10/25/2010  . VITAMIN D DEFICIENCY 11/13/2009   Past Surgical History:  Procedure Laterality Date  . anal fissure surgury     pt unsure of this  . ATHERECTOMY  09/28/2012  . ATHERECTOMY N/A 07/12/2012   Procedure: ATHERECTOMY;  Surgeon: [name], MD;  Location: [facility] CATH LAB;  Service: Cardiovascular;  Laterality: N/A;  . ATHERECTOMY N/A 09/28/2012   Procedure: ATHERECTOMY;  Surgeon: [name], MD;  Location: [facility] CATH LAB;  Service: Cardiovascular;  Laterality: N/A;  . bilateral inguinal hernia with mesh and umbilical hernia repairs  09/2007  . CATARACT EXTRACTION Bilateral    bilateral  . COLONOSCOPY    . GROIN DISSECTION Right 07/20/2013   Procedure: GROIN EXPLORATION, EXCISIONAL BIOPSY RIGHT INGUINAL LYMPH NODES;  Surgeon: Harl Bowie, MD;  Location: [place];  Service: General;  Laterality: Right;  . HERNIA REPAIR    . left knee arthroscopy    . LUMBAR LAMINECTOMY/DECOMPRESSION MICRODISCECTOMY N/A 11/05/2016   Procedure: Microlumbar decompression L2-3, L3-4, L4-5, lateral mass fusion L4-5;  Surgeon: Susa Day, MD;  Location: [facility];  Service: Orthopedics;  Laterality: N/A;  . PERCUTANEOUS STENT INTERVENTION  09/28/2012   Procedure: PERCUTANEOUS STENT INTERVENTION;  Surgeon: [name], MD;  Location: [facility] CATH LAB;  Service: Cardiovascular;;  . UPPER GI ENDOSCOPY      reports that he quit smoking about 45 years ago. His smokeless tobacco use includes chew. He reports that he drinks about  3.0 oz of alcohol per week. He reports that he does not use drugs. family history includes COPD in his father and mother; Cerebral aneurysm in his mother. Allergies  Allergen Reactions  . Contrast Media [Iodinated Diagnostic Agents]     Break out in welts  . Lipitor [Atorvastatin] Other (See Comments)    myalgias  . Lovastatin     Muscle pain  . Statins     Legs pain   Current Outpatient Medications on File Prior to Visit    Medication Sig Dispense Refill  . clopidogrel (PLAVIX) 75 MG tablet TAKE 1 TABLET BY MOUTH  DAILY 90 tablet 2  . cyanocobalamin 1000 MCG tablet Take 1,000 mcg by mouth daily. On hold for procedure    . LIALDA 1.2 g EC tablet TAKE 2 TABLETS BY MOUTH  DAILY WITH BREAKFAST 180 tablet 12  . lisinopril-hydrochlorothiazide (PRINZIDE,ZESTORETIC) 20-25 MG tablet TAKE 1 TABLET BY MOUTH  DAILY 90 tablet 2  . pantoprazole (PROTONIX) 40 MG tablet Take 1 tablet (40 mg total) by mouth daily. 30 tablet 12  . zolpidem (AMBIEN CR) 12.5 MG CR tablet Take 1 tablet (12.5 mg total) by mouth at bedtime as needed. for sleep 30 tablet 5   No current facility-administered medications on file prior to visit.    Review of Systems  Constitutional: Negative for other unusual diaphoresis or sweats HENT: Negative for ear discharge or swelling Eyes: Negative for other worsening visual disturbances Respiratory: Negative for stridor or other swelling  Gastrointestinal: Negative for worsening distension or other blood Genitourinary: Negative for retention or other urinary change Musculoskeletal: Negative for other MSK pain or swelling Skin: Negative for color change or other new lesions Neurological: Negative for worsening tremors and other numbness  Psychiatric/Behavioral: Negative for worsening agitation or other fatigue All other system neg per pt    Objective:   Physical Exam BP 124/80   Pulse 60   Temp 98.3 F (36.8 C) (Oral)   Ht 5' 8"  (1.727 m)   Wt 229 lb (103.9 kg)   SpO2 96%   BMI 34.82 kg/m  VS noted,  Constitutional: Pt appears in NAD HENT: Head: NCAT.  Right Ear: External ear normal.  Left Ear: External ear normal.  Eyes: . Pupils are equal, round, and reactive to light. Conjunctivae and EOM are normal Nose: without d/c or deformity Neck: Neck supple. Gross normal ROM Cardiovascular: Normal rate and regular rhythm.   Pulmonary/Chest: Effort normal and breath sounds without rales or wheezing.   Abd:  Soft, NT, ND, + BS, no organomegaly Neurological: Pt is alert. At baseline orientation, motor grossly intact Skin: Skin is warm. No rashes, other new lesions, no LE edema Psychiatric: Pt behavior is normal without agitation  No other exam findings  Lab Results  Component Value Date   WBC 6.2 12/28/2017   HGB 16.4 12/28/2017   HCT 46.7 12/28/2017   PLT 190.0 12/28/2017   GLUCOSE 124 (H) 12/28/2017   CHOL 168 12/28/2017   TRIG 119.0 12/28/2017   HDL 32.00 (L) 12/28/2017   LDLDIRECT 184.7 10/14/2013   LDLCALC 112 (H) 12/28/2017   ALT 16 12/28/2017   AST 15 12/28/2017   NA 136 12/28/2017   K 3.6 12/28/2017   CL 98 12/28/2017   CREATININE 0.93 12/28/2017   BUN 14 12/28/2017   CO2 28 12/28/2017   TSH 1.30 12/28/2017   PSA 1.45 12/28/2017   HGBA1C 5.9 12/28/2017   MICROALBUR 1.4 10/14/2010      Assessment & Plan:

## 2018-03-17 NOTE — Patient Instructions (Addendum)
Please take all new medication as prescribed - the pain medication if needed  Please make an appt as you leave today with Sports Medicine (Dr Tamala Julian)  Please continue all other medications as before, and refills have been done if requested.  Please have the pharmacy call with any other refills you may need.  Please continue your efforts at being more active, low cholesterol diet, and weight control.  Please keep your appointments with your specialists as you may have planned  Please go to the XRAY Department in the Basement (go straight as you get off the elevator) for the x-ray testing  You will be contacted by phone if any changes need to be made immediately.  Otherwise, you will receive a letter about your results with an explanation, but please check with MyChart first.  Please remember to sign up for MyChart if you have not done so, as this will be important to you in the future with finding out test results, communicating by private email, and scheduling acute appointments online when needed.

## 2018-03-17 NOTE — Assessment & Plan Note (Signed)
stable overall by history and exam, recent data reviewed with pt, and pt to continue medical treatment as before,  to f/u any worsening symptoms or concerns Lab Results  Component Value Date   HGBA1C 5.9 12/28/2017

## 2018-03-17 NOTE — Assessment & Plan Note (Signed)
Also for f/u LE arterial study as is due

## 2018-03-17 NOTE — Assessment & Plan Note (Signed)
Etiology unclear, for plain films, tramadol prn, and sports medicin referral,  to f/u any worsening symptoms or concerns

## 2018-03-17 NOTE — Assessment & Plan Note (Signed)
stable overall by history and exam, recent data reviewed with pt, and pt to continue medical treatment as before,  to f/u any worsening symptoms or concerns BP Readings from Last 3 Encounters:  03/17/18 124/80  01/01/18 136/60  12/30/17 114/64

## 2018-03-22 ENCOUNTER — Other Ambulatory Visit: Payer: Self-pay | Admitting: Internal Medicine

## 2018-03-22 DIAGNOSIS — I739 Peripheral vascular disease, unspecified: Secondary | ICD-10-CM

## 2018-03-23 ENCOUNTER — Other Ambulatory Visit: Payer: Self-pay | Admitting: Internal Medicine

## 2018-03-23 ENCOUNTER — Encounter: Payer: Self-pay | Admitting: Internal Medicine

## 2018-03-23 ENCOUNTER — Ambulatory Visit (HOSPITAL_COMMUNITY)
Admission: RE | Admit: 2018-03-23 | Discharge: 2018-03-23 | Disposition: A | Discharge status: Home / Self Care | Payer: Medicare Other | Source: Ambulatory Visit | Attending: Cardiovascular Disease | Admitting: Cardiovascular Disease

## 2018-03-23 DIAGNOSIS — M79605 Pain in left leg: Secondary | ICD-10-CM | POA: Insufficient documentation

## 2018-03-23 DIAGNOSIS — I739 Peripheral vascular disease, unspecified: Secondary | ICD-10-CM

## 2018-03-23 DIAGNOSIS — M79604 Pain in right leg: Secondary | ICD-10-CM | POA: Insufficient documentation

## 2018-03-24 ENCOUNTER — Telehealth: Payer: Self-pay

## 2018-03-24 NOTE — Telephone Encounter (Signed)
Called pt, LVM.   CRM created.  

## 2018-03-24 NOTE — Telephone Encounter (Signed)
-----   Message from Biagio Borg, MD sent at 03/23/2018  6:14 PM EDT ----- Letter sent, cont same tx except  The test results show that your current treatment is OK, except the circulation appears some worsening since 2016.  We will need to refer you to Vascular Surgury for further consideration, and you should hear from the office soon.    shirron or lucy - please print and send letter regarding these results, as well as contact pt by phone to inform, thanks

## 2018-04-09 ENCOUNTER — Encounter: Payer: Self-pay | Admitting: Family Medicine

## 2018-04-09 ENCOUNTER — Other Ambulatory Visit (INDEPENDENT_AMBULATORY_CARE_PROVIDER_SITE_OTHER): Payer: Medicare Other

## 2018-04-09 ENCOUNTER — Ambulatory Visit: Payer: Medicare Other | Admitting: Family Medicine

## 2018-04-09 VITALS — BP 122/66 | HR 71 | Ht 68.0 in | Wt 222.0 lb

## 2018-04-09 DIAGNOSIS — M2550 Pain in unspecified joint: Secondary | ICD-10-CM | POA: Diagnosis not present

## 2018-04-09 DIAGNOSIS — M48061 Spinal stenosis, lumbar region without neurogenic claudication: Secondary | ICD-10-CM

## 2018-04-09 DIAGNOSIS — I739 Peripheral vascular disease, unspecified: Secondary | ICD-10-CM | POA: Diagnosis not present

## 2018-04-09 LAB — COMPREHENSIVE METABOLIC PANEL
ALBUMIN: 4.3 g/dL (ref 3.5–5.2)
ALK PHOS: 51 U/L (ref 39–117)
ALT: 16 U/L (ref 0–53)
AST: 13 U/L (ref 0–37)
BILIRUBIN TOTAL: 0.7 mg/dL (ref 0.2–1.2)
BUN: 19 mg/dL (ref 6–23)
CO2: 29 mEq/L (ref 19–32)
CREATININE: 1.14 mg/dL (ref 0.40–1.50)
Calcium: 9.7 mg/dL (ref 8.4–10.5)
Chloride: 99 mEq/L (ref 96–112)
GFR: 66.24 mL/min (ref >60.00)
Glucose, Bld: 126 mg/dL — ABNORMAL HIGH (ref 70–99)
POTASSIUM: 5.1 meq/L (ref 3.5–5.1)
SODIUM: 134 meq/L — AB (ref 135–145)
TOTAL PROTEIN: 7.2 g/dL (ref 6.0–8.3)

## 2018-04-09 LAB — CBC WITH DIFFERENTIAL/PLATELET
Basophils Absolute: 0.1 10*3/uL (ref 0.0–0.1)
Basophils Relative: 1.2 % (ref 0.0–3.0)
EOS PCT: 2.4 % (ref 0.0–5.0)
Eosinophils Absolute: 0.2 10*3/uL (ref 0.0–0.7)
HCT: 46.1 % (ref 39.0–52.0)
HEMOGLOBIN: 15.9 g/dL (ref 13.0–17.0)
Lymphocytes Relative: 8.6 % — ABNORMAL LOW (ref 12.0–46.0)
Lymphs Abs: 0.7 10*3/uL (ref 0.7–4.0)
MCHC: 34.5 g/dL (ref 30.0–36.0)
MCV: 88.1 fl (ref 78.0–100.0)
MONO ABS: 0.7 10*3/uL (ref 0.1–1.0)
MONOS PCT: 8.9 % (ref 3.0–12.0)
Neutro Abs: 6 10*3/uL (ref 1.4–7.7)
Neutrophils Relative %: 78.9 % — ABNORMAL HIGH (ref 43.0–77.0)
Platelets: 202 10*3/uL (ref 150.0–400.0)
RBC: 5.23 Mil/uL (ref 4.22–5.81)
RDW: 14.4 % (ref 11.5–15.5)
WBC: 7.6 10*3/uL (ref 4.0–10.5)

## 2018-04-09 LAB — IBC PANEL
IRON: 148 ug/dL (ref 42–165)
Saturation Ratios: 46.4 % (ref 20.0–50.0)
Transferrin: 228 mg/dL (ref 212.0–360.0)

## 2018-04-09 LAB — VITAMIN D 25 HYDROXY (VIT D DEFICIENCY, FRACTURES): VITD: 32.31 ng/mL (ref 30.00–100.00)

## 2018-04-09 LAB — VITAMIN B12: Vitamin B-12: 827 pg/mL (ref 211–911)

## 2018-04-09 LAB — SEDIMENTATION RATE: Sed Rate: 19 mm/hr (ref 0–20)

## 2018-04-09 LAB — URIC ACID: URIC ACID, SERUM: 8.6 mg/dL — AB (ref 4.0–7.8)

## 2018-04-09 MED ORDER — CILOSTAZOL 50 MG PO TABS: 25.0000 mg | ORAL_TABLET | Freq: Two times a day (BID) | ORAL | 3 refills | Status: DC

## 2018-04-09 MED ORDER — VITAMIN D (ERGOCALCIFEROL) 1.25 MG (50000 UNIT) PO CAPS: 50000.0000 [IU] | ORAL_CAPSULE | ORAL | 0 refills | Status: DC

## 2018-04-09 MED ORDER — GABAPENTIN 100 MG PO CAPS: 200.0000 mg | ORAL_CAPSULE | Freq: Every day | ORAL | 3 refills | Status: DC

## 2018-04-09 NOTE — Patient Instructions (Addendum)
Good to meet you  For your legs we are trying multiple small changes.  Pletal 1/2 tab 2 times a day for the blood flow- watch for feeling light headed Once wekely vitamin D for muscle strength and endurance Gabapentin 226m at night to help with the nerve pain and sleep  Over the counter get Tart cherrt extract any dose at night Get labs downstairs and we will call you with the results later See em agai nin 4 weeks and see how you are doing

## 2018-04-09 NOTE — Assessment & Plan Note (Signed)
I believe the patient's bilateral leg pain is likely contributing and is multifactorial.  Discussed with patient in great length about spinal stenosis.  Patient will be started on gabapentin 9 did not with patient's of any type of side effects.  Patient will call otherwise.  Laboratory work-up to further evaluate if this is secondary to some of the pain that he is having.  Also will treat peripheral vascular disease.

## 2018-04-09 NOTE — Assessment & Plan Note (Signed)
Significant overall.  Pletal given to try to help with the vascular component of patient's peripheral arterial and vascular disease.  Significant claudication.  Discussed with patient about compression of the possibility, started Pletal at a low dose.  Warned of potential side effects.  Once again laboratory work-up to further evaluate

## 2018-04-09 NOTE — Progress Notes (Signed)
[name] [address], [place] [address] Phone: [phone] Subjective:    I'm seeing this patient by the request  of:  Biagio Borg, MD   CC: Bilateral leg pain  [id number]  [name] [name] is a 77 y.o. male coming in with complaint of bilateral leg pain. He states that he has lower leg and upper leg spasms. Has had back pain and has had stents put in. Seemed to have pain even after stents.  Found to have severe peripheral vascular disease.  Also found to have some lumbar spinal stenosis.  Had back surgery 2 years ago. Was told that his leg pain would go away. Unable to walk 60 feet without his legs giving out on him. Has seen visible muscle twitch. Leg pain causes him to lose sleep. Did try Tramadol which did not alleviate his pain.  Patient states that nothing seems to be working.  Feels that the daily activities is worsening.  Denies any numbness just pain in the legs and does have twitching of the muscles when sitting.  Feels like he is losing more strength on a regular basis.  Back pain now is improved since the surgery.     Reviewed patient's MRI of the lumbar spine from December 2017.  Found to have that advanced facet arthropathy at L4-L5 and spinal stenosis that is severe at L4-L5.  This is before patient's last surgery  Most recent vascular testing was from July 2019.  Found to have severe progression with significant stenosis of the femoral and popliteal arteries bilaterally right greater than left. Past Medical History:  Diagnosis Date  . Abdominal pain, unspecified site 10/16/2009  . Anal fissure 04/24/2008  . Arthritis   . B12 deficiency 10/11/2014  . BACK PAIN 01/10/2009  . Barrett's esophagus 1997  . BARRETTS ESOPHAGUS 08/16/2007  . BREAST HYPERTROPHY 02/16/2009  . CARBUNCLE, NECK 02/14/2008  . Cataract   . DIVERTICULOSIS, COLON 06/28/2007  . Full dentures   . GERD 06/28/2007  . GLUCOSE INTOLERANCE 08/15/2008  . Headache(784.0)  11/13/2009  . HERNIA, UMBILICAL W/OBSTRUCTION W/O GANGRENE 06/28/2007  . HTN (hypertension) 07/12/2012  . HYPERLIPIDEMIA 06/28/2007  . HYPERTENSION 06/28/2007  . Impaired glucose tolerance 04/13/2011  . INSOMNIA-SLEEP DISORDER-UNSPEC 10/10/2008  . LEG PAIN, BILATERAL 02/16/2009  . LOW BACK PAIN 06/28/2007  . MASTITIS 01/10/2009  . NECK PAIN 05/31/2008  . NEPHROLITHIASIS, HX OF 06/28/2007  . PROCTOSIGMOIDITIS, ULCERATIVE 1997  . PVD (peripheral vascular disease) with claudication, lifestyle limiting, ABI rt. 0.67, lt. 0.73 07/12/2012  . S/P angioplasty with stent, after DB athrectomy to Rt. ext. iliac and Rt. SFA 07/12/2012  . Skin cancer    right hand  . Spinal stenosis at L4-L5 level   . Stenosis of left carotid artery, mod to severe by dopplers 09/29/2012  . TESTICULAR PAIN, RIGHT 10/25/2010  . VITAMIN D DEFICIENCY 11/13/2009   Past Surgical History:  Procedure Laterality Date  . anal fissure surgury     pt unsure of this  . ATHERECTOMY  09/28/2012  . ATHERECTOMY N/A 07/12/2012   Procedure: ATHERECTOMY;  Surgeon: [name], MD;  Location: [facility] CATH LAB;  Service: Cardiovascular;  Laterality: N/A;  . ATHERECTOMY N/A 09/28/2012   Procedure: ATHERECTOMY;  Surgeon: [name], MD;  [facility] C[facility] LAB;  Service: Cardiovascular;  Laterality: N/A;  . bilateral inguinal hernia with mesh and umbilical hernia repairs  09/2007  . CATARACT EXTRACTION Bilateral    bilateral  . COLONOSCOPY    .  GROIN DISSECTION Right 07/20/2013   Procedure: GROIN EXPLORATION, EXCISIONAL BIOPSY RIGHT INGUINAL LYMPH NODES;  Surgeon: Harl Bowie, MD;  Location: [facility];  Service: General;  Laterality: Right;  . HERNIA REPAIR    . left knee arthroscopy    . LUMBAR LAMINECTOMY/DECOMPRESSION MICRODISCECTOMY N/A 11/05/2016   Procedure: Microlumbar decompression L2-3, L3-4, L4-5, lateral mass fusion L4-5;  Surgeon: Susa Day, MD;  Location: [facility];  Service: Orthopedics;  Laterality: N/A;    . PERCUTANEOUS STENT INTERVENTION  09/28/2012   Procedure: PERCUTANEOUS STENT INTERVENTION;  Surgeon: [name], MD;  Location: [facility] CATH LAB;  Service: Cardiovascular;;  . UPPER GI ENDOSCOPY     Social History   Socioeconomic History  . Marital status: Divorced    Spouse name: Not on file  . Number of children: 3  . Years of education: Not on file  . Highest education level: Not on file  Occupational History  . Occupation: retired Investment banker, operational  . Financial resource strain: Not on file  . Food insecurity:    Worry: Not on file    Inability: Not on file  . Transportation needs:    Medical: Not on file    Non-medical: Not on file  Tobacco Use  . Smoking status: Former Smoker    Last attempt to quit: 09/08/1972    Years since quitting: 45.6  . Smokeless tobacco: Current User    Types: Chew  Substance and Sexual Activity  . Alcohol use: Yes    Alcohol/week: 3.0 oz    Types: 5 Cans of beer per week    Comment: 1 TO 2 BEERS PER DAY  . Drug use: No  . Sexual activity: Not Currently    Partners: Female  Lifestyle  . Physical activity:    Days per week: Not on file    Minutes per session: Not on file  . Stress: Not on file  Relationships  . Social connections:    Talks on phone: Not on file    Gets together: Not on file    Attends religious service: Not on file    Active member of club or organization: Not on file    Attends meetings of clubs or organizations: Not on file    Relationship status: Not on file  Other Topics Concern  . Not on file  Social History Narrative   Daily caffeine 1 cup coffee daily   Allergies  Allergen Reactions  . Contrast Media [Iodinated Diagnostic Agents]     Break out in welts  . Lipitor [Atorvastatin] Other (See Comments)    myalgias  . Lovastatin     Muscle pain  . Statins     Legs pain   Family History  Problem Relation Age of Onset  . COPD Mother   . Cerebral aneurysm Mother   . COPD Father   . Colon  cancer Neg Hx   . Esophageal cancer Neg Hx   . Rectal cancer Neg Hx   . Stomach cancer Neg Hx      Past medical history, social, surgical and family history all reviewed in electronic medical record.  No pertanent information unless stated regarding to the chief complaint.   Review of Systems:Review of systems updated and as accurate as of 04/09/18  No headache, visual changes, nausea, vomiting, diarrhea, constipation, dizziness, abdominal pain, skin rash, fevers, chills, night sweats, weight loss, swollen lymph nodes,, chest pain, shortness of breath, mood changes.  Positive body aches, muscle  aches  Objective  Blood pressure 122/66, pulse 71, height 5' 8"  (1.727 m), weight 222 lb (100.7 kg), SpO2 93 %. Systems examined below as of 04/09/18   General: No apparent distress alert and oriented x3 mood and affect normal, dressed appropriately.  HEENT: Pupils equal, extraocular movements intact  Respiratory: Patient's speak in full sentences and does not appear short of breath  Cardiovascular: This lower extremity edema, non tender, no erythema  Skin: Warm dry intact with no signs of infection or rash on extremities or on axial skeleton.  Abdomen: Soft nontender  Neuro: Cranial nerves II through XII are intact, neurovascularly intact in all extremities with 1+ DTRs lower extremities and 1+ pulses posterior tibialis artery Lymph: No lymphadenopathy of posterior or anterior cervical chain or axillae bilaterally.  Gait severely antalgic MSK:  tender with mild limited range of motion and good stability and symmetric strength and tone of shoulders, elbows, wrist,  and bilaterally.  Arthritic changes of multiple joints. Patient does have severe tenderness to the patient's calf, quadriceps and hamstrings bilaterally.  Patient does have some mild atrophy of the lower extremities compared to his upper extremities.  Deep tendon reflexes 1+ of the patella and the Achilles bilaterally.  No beats of clonus  noted.  Poor capillary refill distally.     Impression and Recommendations:     This case required medical decision making of moderate complexity.      Note: This dictation was prepared with Dragon dictation along with smaller phrase technology. Any transcriptional errors that result from this process are unintentional.

## 2018-04-13 LAB — PTH, INTACT AND CALCIUM
CALCIUM: 9.5 mg/dL (ref 8.6–10.3)
PTH: 50 pg/mL (ref 14–64)

## 2018-04-14 ENCOUNTER — Telehealth: Payer: Self-pay

## 2018-04-14 NOTE — Progress Notes (Signed)
Left message per a verbal from Dr. Tamala Julian. Patient is fine discontinuing Pletal medication and should follow up with his PCP for cramping.

## 2018-04-14 NOTE — Telephone Encounter (Signed)
Patient's daughter Arrie Aran called back to see if Dr. Tamala Julian would like for patient to start on gout medication and if so could he send that rx in for him. I told her that I would call her back tomorrow once I spoke with Dr. Tamala Julian who is out of the office this afternoon.

## 2018-04-14 NOTE — Progress Notes (Signed)
Spoke with patient's daughter Arrie Aran per lab results. She wants to know if patient's bp medication could be causing cramping as he has had leg and hand cramps the past two nights. Will call patient back once I have spoken with Dr. Tamala Julian.

## 2018-04-15 MED ORDER — ALLOPURINOL 100 MG PO TABS: 200.0000 mg | ORAL_TABLET | Freq: Every day | ORAL | 6 refills | Status: DC

## 2018-04-15 NOTE — Telephone Encounter (Signed)
Spoke with daughter Arrie Aran per message.

## 2018-04-15 NOTE — Telephone Encounter (Signed)
allopurinol 273m daily stop if develop skin rash otherwise very well tolerated

## 2018-05-04 ENCOUNTER — Encounter: Payer: Self-pay | Admitting: *Deleted

## 2018-05-04 ENCOUNTER — Encounter: Payer: Self-pay | Admitting: Vascular Surgery

## 2018-05-04 ENCOUNTER — Ambulatory Visit: Payer: Medicare Other | Admitting: Vascular Surgery

## 2018-05-04 ENCOUNTER — Other Ambulatory Visit: Payer: Self-pay | Admitting: *Deleted

## 2018-05-04 DIAGNOSIS — I739 Peripheral vascular disease, unspecified: Secondary | ICD-10-CM | POA: Insufficient documentation

## 2018-05-04 NOTE — Progress Notes (Signed)
Patient name: [name] [name] MRN: [id number] DOB: 05/29/1941 Sex: male  REASON FOR CONSULT: Claudication of the bilateral lower extremities progressing to rest pain  HPI: [name] [name] is a 77 y.o. male the presents for evaluation of bilateral lower external claudication that is progressed to rest pain.  Patient has had multiple previous interventions including a right external iliac stent right superficial femoral artery stent and a left superficial femoral artery stent I will place between 2013 2014.  Patient states that the time he was having trouble walking with pain in his calves.  He states that is progressed [place] to bilateral calf pain but now he has pain in both of his thighs.  He states he can only walk about 125fet before he gets severe pain.  Moreover he states over the last month or so this is progressed to pain in both of his feet that keeps him awake at night.  All of his previous stent work [facility]  He states that he quit smoking about 40 years ago.  He cannot take an aspirin due to a history of colitis and also cannot take a statin due to statin intolerance in the past.  Dr. BNaida Sleightnote suggests that he did a left SFA atherectomy with a turbo Hawk and subsequently deployed a 7 mm x 3 cm nitinol self-expanding stent with 0% residual SFA stenosis and three-vessel runoff in 2014.  On the right he is also had an SFA atherectomy with a diamondback and then a 6 mm x 18 mm self-expanding stent with a second 6 mm x 60 mm stent to cover the dissection as well as stenting of the right external iliac artery  Past Medical History:  Diagnosis Date  . Abdominal pain, unspecified site 10/16/2009  . Anal fissure 04/24/2008  . Arthritis   . B12 deficiency 10/11/2014  . BACK PAIN 01/10/2009  . Barrett's esophagus 1997  . BARRETTS ESOPHAGUS 08/16/2007  . BREAST HYPERTROPHY 02/16/2009  . CARBUNCLE, NECK 02/14/2008  . Cataract   . DIVERTICULOSIS, COLON 06/28/2007   . Full dentures   . GERD 06/28/2007  . GLUCOSE INTOLERANCE 08/15/2008  . Headache(784.0) 11/13/2009  . HERNIA, UMBILICAL W/OBSTRUCTION W/O GANGRENE 06/28/2007  . HTN (hypertension) 07/12/2012  . HYPERLIPIDEMIA 06/28/2007  . HYPERTENSION 06/28/2007  . Impaired glucose tolerance 04/13/2011  . INSOMNIA-SLEEP DISORDER-UNSPEC 10/10/2008  . LEG PAIN, BILATERAL 02/16/2009  . LOW BACK PAIN 06/28/2007  . MASTITIS 01/10/2009  . NECK PAIN 05/31/2008  . NEPHROLITHIASIS, HX OF 06/28/2007  . PROCTOSIGMOIDITIS, ULCERATIVE 1997  . PVD (peripheral vascular disease) with claudication, lifestyle limiting, ABI rt. 0.67, lt. 0.73 07/12/2012  . S/P angioplasty with stent, after DB athrectomy to Rt. ext. iliac and Rt. SFA 07/12/2012  . Skin cancer    right hand  . Spinal stenosis at L4-L5 level   . Stenosis of left carotid artery, mod to severe by dopplers 09/29/2012  . TESTICULAR PAIN, RIGHT 10/25/2010  . VITAMIN D DEFICIENCY 11/13/2009    Past Surgical History:  Procedure Laterality Date  . anal fissure surgury     pt unsure of this  . ATHERECTOMY  09/28/2012  . ATHERECTOMY N/A 07/12/2012   Procedure: ATHERECTOMY;  Surgeon: JLorretta Harp MD;  [facility];  Service: Cardiovascular;  Laterality: N/A;  . ATHERECTOMY N/A 09/28/2012   Procedure: ATHERECTOMY;  Surgeon: JLorretta Harp MD;  [facility];  Service: Cardiovascular;  Laterality: N/A;  . bilateral  inguinal hernia with mesh and umbilical hernia repairs  09/2007  . CATARACT EXTRACTION Bilateral    bilateral  . COLONOSCOPY    . GROIN DISSECTION Right 07/20/2013   Procedure: GROIN EXPLORATION, EXCISIONAL BIOPSY RIGHT INGUINAL LYMPH NODES;  Surgeon: Harl Bowie, MD;  Location: [place];  Service: General;  Laterality: Right;  . HERNIA REPAIR    . left knee arthroscopy    . LUMBAR LAMINECTOMY/DECOMPRESSION MICRODISCECTOMY N/A 11/05/2016   Procedure: Microlumbar decompression L2-3, L3-4, L4-5, lateral mass fusion  L4-5;  Surgeon: Susa Day, MD;  Location: [facility];  Service: Orthopedics;  Laterality: N/A;  . PERCUTANEOUS STENT INTERVENTION  09/28/2012   Procedure: PERCUTANEOUS STENT INTERVENTION;  Surgeon: [name], MD;  [facility] LAB;  Service: Cardiovascular;;  . UPPER GI ENDOSCOPY      Family History  Problem Relation Age of Onset  . COPD Mother   . Cerebral aneurysm Mother   . COPD Father   . Colon cancer Neg Hx   . Esophageal cancer Neg Hx   . Rectal cancer Neg Hx   . Stomach cancer Neg Hx     SOCIAL HISTORY: Social History   Socioeconomic History  . Marital status: Divorced    Spouse name: Not on file  . Number of children: 3  . Years of education: Not on file  . Highest education level: Not on file  Occupational History  . Occupation: retired Investment banker, operational  . Financial resource strain: Not on file  . Food insecurity:    Worry: Not on file    Inability: Not on file  . Transportation needs:    Medical: Not on file    Non-medical: Not on file  Tobacco Use  . Smoking status: Former Smoker    Last attempt to quit: 09/08/1972    Years since quitting: 45.6  . Smokeless tobacco: Current User    Types: Chew  Substance and Sexual Activity  . Alcohol use: Yes    Alcohol/week: 5.0 standard drinks    Types: 5 Cans of beer per week    Comment: 1 TO 2 BEERS PER DAY  . Drug use: No  . Sexual activity: Not Currently    Partners: Female  Lifestyle  . Physical activity:    Days per week: Not on file    Minutes per session: Not on file  . Stress: Not on file  Relationships  . Social connections:    Talks on phone: Not on file    Gets together: Not on file    Attends religious service: Not on file    Active member of club or organization: Not on file    Attends meetings of clubs or organizations: Not on file    Relationship status: Not on file  . Intimate partner violence:    Fear of current or ex partner: Not on file    Emotionally abused: Not on  file    Physically abused: Not on file    Forced sexual activity: Not on file  Other Topics Concern  . Not on file  Social History Narrative   Daily caffeine 1 cup coffee daily    Allergies  Allergen Reactions  . Contrast Media [Iodinated Diagnostic Agents]     Break out in welts  . Lipitor [Atorvastatin] Other (See Comments)    myalgias  . Lovastatin     Muscle pain  . Statins     Legs pain    Current Outpatient  Medications  Medication Sig Dispense Refill  . allopurinol (ZYLOPRIM) 100 MG tablet Take 2 tablets (200 mg total) by mouth daily. 60 tablet 6  . cilostazol (PLETAL) 50 MG tablet Take 0.5 tablets (25 mg total) by mouth 2 (two) times daily. 15 tablet 3  . clopidogrel (PLAVIX) 75 MG tablet TAKE 1 TABLET BY MOUTH  DAILY 90 tablet 2  . gabapentin (NEURONTIN) 100 MG capsule Take 2 capsules (200 mg total) by mouth at bedtime. 60 capsule 3  . LIALDA 1.2 g EC tablet TAKE 2 TABLETS BY MOUTH  DAILY WITH BREAKFAST 180 tablet 12  . lisinopril-hydrochlorothiazide (PRINZIDE,ZESTORETIC) 20-25 MG tablet TAKE 1 TABLET BY MOUTH  DAILY 90 tablet 2  . pantoprazole (PROTONIX) 40 MG tablet Take 1 tablet (40 mg total) by mouth daily. 30 tablet 12  . Vitamin D, Ergocalciferol, (DRISDOL) 50000 units CAPS capsule Take 1 capsule (50,000 Units total) by mouth every 7 (seven) days. 12 capsule 0  . zolpidem (AMBIEN CR) 12.5 MG CR tablet Take 1 tablet (12.5 mg total) by mouth at bedtime as needed. for sleep 30 tablet 5  . cyanocobalamin 1000 MCG tablet Take 1,000 mcg by mouth daily. On hold for procedure     No current facility-administered medications for this visit.     REVIEW OF SYSTEMS:  [X]  denotes positive finding, [ ]  denotes negative finding Cardiac  Comments:  Chest pain or chest pressure:    Shortness of breath upon exertion:    Short of breath when lying flat:    Irregular heart rhythm:        Vascular    Pain in calf, thigh, or hip brought on by ambulation: x   Pain in feet at  night that wakes you up from your sleep:  x   Blood clot in your veins:    Leg swelling:         Pulmonary    Oxygen at home:    Productive cough:     Wheezing:         Neurologic    Sudden weakness in arms or legs:     Sudden numbness in arms or legs:     Sudden onset of difficulty speaking or slurred speech:    Temporary loss of vision in one eye:     Problems with dizziness:         Gastrointestinal    Blood in stool:     Vomited blood:         Genitourinary    Burning when urinating:     Blood in urine:        Psychiatric    Major depression:         Hematologic    Bleeding problems:    Problems with blood clotting too easily:        Skin    Rashes or ulcers:        Constitutional    Fever or chills:      PHYSICAL EXAM: Vitals:   05/04/18 1450  BP: 114/64  Pulse: 72  Resp: 16  Temp: 98 F (36.7 C)  TempSrc: Oral  SpO2: 100%  Weight: 102.5 kg  Height: 5' 8"  (1.727 m)    GENERAL: The patient is a well-nourished male, in no acute distress. The vital signs are documented above. CARDIAC: There is a regular rate and rhythm.  VASCULAR:  2+ radial pulse palpable bilateral upper extremities. 1+ palpable right femoral pulse.  No appreciable left femoral pulse. No palpable  popliteal pulse. Monophasic posterior tibial and anterior tibial signals in the bilateral lower extremities. No tissue loss. PULMONARY: There is good air exchange bilaterally without wheezing or rales. ABDOMEN: Soft and non-tender with normal pitched bowel sounds.  MUSCULOSKELETAL: There are no major deformities or cyanosis. NEUROLOGIC: No focal weakness or paresthesias are detected. SKIN: There are no ulcers or rashes noted. PSYCHIATRIC: The patient has a normal affect.  DATA:   I independently reviewed his noninvasive imaging.  On the right he had a greater than 50% stenosis of the external iliac artery with monophasic flow down the femoral and popliteal arteries with monophasic flow  through the right SFA stent.  On the left he had biphasic flow through the SFA stent with no elevated velocities.  ABI 0.47 the right lower extremity, 0.68 in the left lower extremity.  Assessment/Plan:  In talking with Mr. Kimura it appears he has progressed to some early rest pain after previously suffering from bilateral lower extremity claudication.  He states his feet are now keeping him awake at night burning and cramping (denies hx neuropathy).  He has had bilateral SFA atherectomy with stent as well as a right external iliac stent by Dr. Gwenlyn Found in 2013 - 2014.  I could appreciate a right femoral pulse but had difficulty appreciating a left femoral pulse.  As a result I recommended proceeding with an aortogram and bilateral lower extremity arteriogram via right groin puncture.  I could certainly treat his iliac stenosis at that time on the right that was noted on his duplex.  We will have to evaluate the rest of his runoff at that time and he knows we will have to return another time to treat the contralateral side if endovascular options exist.  He is on Plavix at this time and he should continue that pending our intervention.  Will arrange for next available angio day in the [facility] lab.  He is allergic to contrast and will need to be premedicated the morning of surgery.   [name], MD [facility] Office: [phone] Pager: [place]

## 2018-05-12 ENCOUNTER — Ambulatory Visit: Payer: Medicare Other | Admitting: Family Medicine

## 2018-05-12 ENCOUNTER — Encounter: Payer: Self-pay | Admitting: Family Medicine

## 2018-05-12 DIAGNOSIS — I739 Peripheral vascular disease, unspecified: Secondary | ICD-10-CM | POA: Diagnosis not present

## 2018-05-12 MED ORDER — ALLOPURINOL 100 MG PO TABS: 200.0000 mg | ORAL_TABLET | Freq: Every day | ORAL | 6 refills | Status: AC

## 2018-05-12 MED ORDER — VITAMIN D (ERGOCALCIFEROL) 1.25 MG (50000 UNIT) PO CAPS: 50000.0000 [IU] | ORAL_CAPSULE | ORAL | 0 refills | Status: DC

## 2018-05-12 NOTE — Progress Notes (Signed)
[name] [name], [name] [address] Phone: [phone] Subjective:   Fontaine No, am serving as a scribe for Dr. Hulan [name] [name].     CC: Leg pain follow-up  [id number]  [name] [name] is a 77 y.o. male coming in with complaint of leg pain. Patient is not feeling any better since last visit. He is having vascular surgery tomorrow due to not having a pulse in left leg. Continues to need to take breaks due to pain in his legs with walking. Has been using the medications prescribed to him last visit. Patient finished Pletal.  Patient is having no surgery tomorrow secondary to the poor pulses noted.  Seems to be more vascular compromise.       Past Medical History:  Diagnosis Date  . Abdominal pain, unspecified site 10/16/2009  . Anal fissure 04/24/2008  . Arthritis   . B12 deficiency 10/11/2014  . BACK PAIN 01/10/2009  . Barrett's esophagus 1997  . BARRETTS ESOPHAGUS 08/16/2007  . BREAST HYPERTROPHY 02/16/2009  . CARBUNCLE, NECK 02/14/2008  . Cataract   . DIVERTICULOSIS, COLON 06/28/2007  . Full dentures   . GERD 06/28/2007  . GLUCOSE INTOLERANCE 08/15/2008  . Headache(784.0) 11/13/2009  . HERNIA, UMBILICAL W/OBSTRUCTION W/O GANGRENE 06/28/2007  . HTN (hypertension) 07/12/2012  . HYPERLIPIDEMIA 06/28/2007  . HYPERTENSION 06/28/2007  . Impaired glucose tolerance 04/13/2011  . INSOMNIA-SLEEP DISORDER-UNSPEC 10/10/2008  . LEG PAIN, BILATERAL 02/16/2009  . LOW BACK PAIN 06/28/2007  . MASTITIS 01/10/2009  . NECK PAIN 05/31/2008  . NEPHROLITHIASIS, HX OF 06/28/2007  . PROCTOSIGMOIDITIS, ULCERATIVE 1997  . PVD (peripheral vascular disease) with claudication, lifestyle limiting, ABI rt. 0.67, lt. 0.73 07/12/2012  . S/P angioplasty with stent, after DB athrectomy to Rt. ext. iliac and Rt. SFA 07/12/2012  . Skin cancer    right hand  . Spinal stenosis at L4-L5 level   . Stenosis of left carotid artery, mod to severe by dopplers 09/29/2012  .  TESTICULAR PAIN, RIGHT 10/25/2010  . VITAMIN D DEFICIENCY 11/13/2009   Past Surgical History:  Procedure Laterality Date  . anal fissure surgury     pt unsure of this  . ATHERECTOMY  09/28/2012  . ATHERECTOMY N/A 07/12/2012   Procedure: ATHERECTOMY;  Surgeon: [name], MD;  Location: [facility] CATH LAB;  Service: Cardiovascular;  Laterality: N/A;  . ATHERECTOMY N/A 09/28/2012   Procedure: ATHERECTOMY;  Surgeon: [name], MD;  Location: [facility] CATH LAB;  Service: Cardiovascular;  Laterality: N/A;  . bilateral inguinal hernia with mesh and umbilical hernia repairs  09/2007  . CATARACT EXTRACTION Bilateral    bilateral  . COLONOSCOPY    . GROIN DISSECTION Right 07/20/2013   Procedure: GROIN EXPLORATION, EXCISIONAL BIOPSY RIGHT INGUINAL LYMPH NODES;  Surgeon: Harl Bowie, MD;  Location: [place];  Service: General;  Laterality: Right;  . HERNIA REPAIR    . left knee arthroscopy    . LUMBAR LAMINECTOMY/DECOMPRESSION MICRODISCECTOMY N/A 11/05/2016   Procedure: Microlumbar decompression L2-3, L3-4, L4-5, lateral mass fusion L4-5;  Surgeon: Susa Day, MD;  Location: [facility];  Service: Orthopedics;  Laterality: N/A;  . PERCUTANEOUS STENT INTERVENTION  09/28/2012   Procedure: PERCUTANEOUS STENT INTERVENTION;  Surgeon: [name], MD;  Location: [facility] CATH LAB;  Service: Cardiovascular;;  . UPPER GI ENDOSCOPY     Social History   Socioeconomic History  . Marital status: Divorced    Spouse name: Not on file  . Number of  children: 3  . Years of education: Not on file  . Highest education level: Not on file  Occupational History  . Occupation: retired Investment banker, operational  . Financial resource strain: Not on file  . Food insecurity:    Worry: Not on file    Inability: Not on file  . Transportation needs:    Medical: Not on file    Non-medical: Not on file  Tobacco Use  . Smoking status: Former Smoker    Last attempt to quit: 09/08/1972    Years since  quitting: 45.7  . Smokeless tobacco: Current User    Types: Chew  Substance and Sexual Activity  . Alcohol use: Yes    Alcohol/week: 5.0 standard drinks    Types: 5 Cans of beer per week    Comment: 1 TO 2 BEERS PER DAY  . Drug use: No  . Sexual activity: Not Currently    Partners: Female  Lifestyle  . Physical activity:    Days per week: Not on file    Minutes per session: Not on file  . Stress: Not on file  Relationships  . Social connections:    Talks on phone: Not on file    Gets together: Not on file    Attends religious service: Not on file    Active member of club or organization: Not on file    Attends meetings of clubs or organizations: Not on file    Relationship status: Not on file  Other Topics Concern  . Not on file  Social History Narrative   Daily caffeine 1 cup coffee daily   Allergies  Allergen Reactions  . Contrast Media [Iodinated Diagnostic Agents] Other (See Comments)    Break out in welts  . Statins Other (See Comments)    Legs pain: Atorvastatin and Lovastatin   Family History  Problem Relation Age of Onset  . COPD Mother   . Cerebral aneurysm Mother   . COPD Father   . Colon cancer Neg Hx   . Esophageal cancer Neg Hx   . Rectal cancer Neg Hx   . Stomach cancer Neg Hx      Current Outpatient Medications (Cardiovascular):  .  lisinopril-hydrochlorothiazide (PRINZIDE,ZESTORETIC) 20-25 MG tablet, TAKE 1 TABLET BY MOUTH  DAILY   Current Outpatient Medications (Analgesics):  .  allopurinol (ZYLOPRIM) 100 MG tablet, Take 2 tablets (200 mg total) by mouth daily.  Current Outpatient Medications (Hematological):  .  clopidogrel (PLAVIX) 75 MG tablet, TAKE 1 TABLET BY MOUTH  DAILY (Patient taking differently: Take 75 mg by mouth daily. ) .  cyanocobalamin 1000 MCG tablet, Take 1,000 mcg by mouth daily.   Current Outpatient Medications (Other):  .  gabapentin (NEURONTIN) 100 MG capsule, Take 2 capsules (200 mg total) by mouth at bedtime. [name]   LIALDA 1.2 g EC tablet, TAKE 2 TABLETS BY MOUTH  DAILY WITH BREAKFAST (Patient taking differently: Take 2.4 g by mouth daily with breakfast. ) .  Misc Natural Products (TART CHERRY ADVANCED PO), Take 1,200 mg by mouth daily. .  pantoprazole (PROTONIX) 40 MG tablet, Take 1 tablet (40 mg total) by mouth daily. .  Vitamin D, Ergocalciferol, (DRISDOL) 50000 units CAPS capsule, Take 1 capsule (50,000 Units total) by mouth every 7 (seven) days. [name]  zolpidem (AMBIEN CR) 12.5 MG CR tablet, Take 1 tablet (12.5 mg total) by mouth at bedtime as needed. for sleep (Patient taking differently: Take 12.5 mg by mouth at bedtime. for sleep)  Past medical history, social, surgical and family history all reviewed in electronic medical record.  No pertanent information unless stated regarding to the chief complaint.   Review of Systems:  No headache, visual changes, nausea, vomiting, diarrhea, constipation, dizziness, abdominal pain, skin rash, fevers, chills, night sweats, weight loss, swollen lymph nodes, body aches, joint swelling, , chest pain, shortness of breath, mood changes.  Positive muscle aches  Objective  Blood pressure (!) 108/56, pulse 75, height 5' 8"  (1.727 m), weight 230 lb (104.3 kg), SpO2 96 %.    General: No apparent distress alert and oriented x3 mood and affect normal, dressed appropriately.  HEENT: Pupils equal, extraocular movements intact  Respiratory: Patient's speak in full sentences and does not appear short of breath  Cardiovascular: Atrophy of musculature of the lower extremities noted.  Minimal pulses noted of the lower extremities. Skin: Warm dry intact with no signs of infection or rash on extremities or on axial skeleton.  Abdomen: Soft nontender obese Neuro: Cranial nerves II through XII are intact, neurovascularly intact in all extremities with 2+ DTRs 4 pulses of the lower extremities Lymph: No lymphadenopathy of posterior or anterior cervical chain or axillae bilaterally.    Gait antalgic MSK:  tender with full range of motion and good stability and symmetric strength and tone of shoulders, elbows, wrist, hip, knee and ankles bilaterally.  Atrophy of the lower extremities.    Impression and Recommendations:     This case required medical decision making of moderate complexity. The above documentation has been reviewed and is accurate and complete Lyndal Pulley, DO       Note: This dictation was prepared with Dragon dictation along with smaller phrase technology. Any transcriptional errors that result from this process are unintentional.

## 2018-05-12 NOTE — Patient Instructions (Addendum)
Good to see you  Good luck to morrow you will do great  After surgery restart the allopurionl 253m daily  Once weekly  Vitamin D still for another 8-12 weeks.  Stop the vitamins  See me again in 4 weeks if not better after the surgery and we will go after the back more

## 2018-05-12 NOTE — Assessment & Plan Note (Signed)
Likely contributing to all of patient's pain.  Differential includes a lumbar radiculopathy with spinal stenosis.  History of an L4-L5 spinal stenosis.  Patient does not make improvement after the surgery then we will consider further evaluation with possible injections.  Patient is somewhat high risk with being on different medications.  We will monitor.

## 2018-05-13 ENCOUNTER — Ambulatory Visit (HOSPITAL_COMMUNITY)
Admission: RE | Admit: 2018-05-13 | Discharge: 2018-05-13 | Disposition: A | Discharge status: Home / Self Care | Payer: Medicare Other | Source: Ambulatory Visit | Attending: Vascular Surgery | Admitting: Vascular Surgery

## 2018-05-13 ENCOUNTER — Other Ambulatory Visit: Payer: Self-pay

## 2018-05-13 ENCOUNTER — Telehealth: Payer: Self-pay | Admitting: Vascular Surgery

## 2018-05-13 ENCOUNTER — Encounter (HOSPITAL_COMMUNITY)
Admission: RE | Disposition: A | Discharge status: Home / Self Care | Payer: Self-pay | Source: Ambulatory Visit | Attending: Vascular Surgery

## 2018-05-13 DIAGNOSIS — Z7902 Long term (current) use of antithrombotics/antiplatelets: Secondary | ICD-10-CM | POA: Insufficient documentation

## 2018-05-13 DIAGNOSIS — K2270 Barrett's esophagus without dysplasia: Secondary | ICD-10-CM | POA: Diagnosis not present

## 2018-05-13 DIAGNOSIS — N62 Hypertrophy of breast: Secondary | ICD-10-CM | POA: Insufficient documentation

## 2018-05-13 DIAGNOSIS — Z87442 Personal history of urinary calculi: Secondary | ICD-10-CM | POA: Diagnosis not present

## 2018-05-13 DIAGNOSIS — Z8249 Family history of ischemic heart disease and other diseases of the circulatory system: Secondary | ICD-10-CM | POA: Diagnosis not present

## 2018-05-13 DIAGNOSIS — I70213 Atherosclerosis of native arteries of extremities with intermittent claudication, bilateral legs: Secondary | ICD-10-CM | POA: Diagnosis not present

## 2018-05-13 DIAGNOSIS — K219 Gastro-esophageal reflux disease without esophagitis: Secondary | ICD-10-CM | POA: Insufficient documentation

## 2018-05-13 DIAGNOSIS — Z9889 Other specified postprocedural states: Secondary | ICD-10-CM | POA: Insufficient documentation

## 2018-05-13 DIAGNOSIS — G4700 Insomnia, unspecified: Secondary | ICD-10-CM | POA: Diagnosis not present

## 2018-05-13 DIAGNOSIS — Z79899 Other long term (current) drug therapy: Secondary | ICD-10-CM | POA: Insufficient documentation

## 2018-05-13 DIAGNOSIS — Z91041 Radiographic dye allergy status: Secondary | ICD-10-CM | POA: Diagnosis not present

## 2018-05-13 DIAGNOSIS — Z8719 Personal history of other diseases of the digestive system: Secondary | ICD-10-CM | POA: Diagnosis not present

## 2018-05-13 DIAGNOSIS — M48061 Spinal stenosis, lumbar region without neurogenic claudication: Secondary | ICD-10-CM | POA: Diagnosis not present

## 2018-05-13 DIAGNOSIS — Z87891 Personal history of nicotine dependence: Secondary | ICD-10-CM | POA: Diagnosis not present

## 2018-05-13 DIAGNOSIS — Z9842 Cataract extraction status, left eye: Secondary | ICD-10-CM | POA: Insufficient documentation

## 2018-05-13 DIAGNOSIS — Z85828 Personal history of other malignant neoplasm of skin: Secondary | ICD-10-CM | POA: Diagnosis not present

## 2018-05-13 DIAGNOSIS — E559 Vitamin D deficiency, unspecified: Secondary | ICD-10-CM | POA: Diagnosis not present

## 2018-05-13 DIAGNOSIS — M1990 Unspecified osteoarthritis, unspecified site: Secondary | ICD-10-CM | POA: Insufficient documentation

## 2018-05-13 DIAGNOSIS — Z888 Allergy status to other drugs, medicaments and biological substances status: Secondary | ICD-10-CM | POA: Insufficient documentation

## 2018-05-13 DIAGNOSIS — Z981 Arthrodesis status: Secondary | ICD-10-CM | POA: Diagnosis not present

## 2018-05-13 DIAGNOSIS — H269 Unspecified cataract: Secondary | ICD-10-CM | POA: Insufficient documentation

## 2018-05-13 DIAGNOSIS — I10 Essential (primary) hypertension: Secondary | ICD-10-CM | POA: Diagnosis not present

## 2018-05-13 DIAGNOSIS — Z9841 Cataract extraction status, right eye: Secondary | ICD-10-CM | POA: Insufficient documentation

## 2018-05-13 DIAGNOSIS — E785 Hyperlipidemia, unspecified: Secondary | ICD-10-CM | POA: Insufficient documentation

## 2018-05-13 DIAGNOSIS — I70223 Atherosclerosis of native arteries of extremities with rest pain, bilateral legs: Secondary | ICD-10-CM | POA: Diagnosis not present

## 2018-05-13 HISTORY — PX: PERIPHERAL VASCULAR INTERVENTION: CATH118257

## 2018-05-13 HISTORY — PX: LOWER EXTREMITY ANGIOGRAPHY: CATH118251

## 2018-05-13 HISTORY — PX: ABDOMINAL AORTOGRAM: CATH118222

## 2018-05-13 LAB — POCT I-STAT, CHEM 8
BUN: 20 mg/dL (ref 8–23)
CALCIUM ION: 0.94 mmol/L — AB (ref 1.15–1.40)
CHLORIDE: 108 mmol/L (ref 98–111)
CREATININE: 1 mg/dL (ref 0.61–1.24)
GLUCOSE: 115 mg/dL — AB (ref 70–99)
HCT: 47 % (ref 39.0–52.0)
Hemoglobin: 16 g/dL (ref 13.0–17.0)
Potassium: 4.2 mmol/L (ref 3.5–5.1)
Sodium: 136 mmol/L (ref 135–145)
TCO2: 21 mmol/L — ABNORMAL LOW (ref 22–32)

## 2018-05-13 LAB — POCT ACTIVATED CLOTTING TIME: Activated Clotting Time: 279 seconds

## 2018-05-13 SURGERY — ABDOMINAL AORTOGRAM
Anesthesia: LOCAL

## 2018-05-13 MED ORDER — FENTANYL CITRATE (PF) 100 MCG/2ML IJ SOLN
INTRAMUSCULAR | Status: AC
  Filled 2018-05-13: qty 2

## 2018-05-13 MED ORDER — MIDAZOLAM HCL 2 MG/2ML IJ SOLN
INTRAMUSCULAR | Status: DC | PRN
  Administered 2018-05-13: 1 mg via INTRAVENOUS

## 2018-05-13 MED ORDER — FAMOTIDINE IN NACL 20-0.9 MG/50ML-% IV SOLN
20.0000 mg | INTRAVENOUS | Status: AC
  Administered 2018-05-13: 20 mg via INTRAVENOUS
  Filled 2018-05-13: qty 50

## 2018-05-13 MED ORDER — ONDANSETRON HCL 4 MG/2ML IJ SOLN: 4.0000 mg | Freq: Four times a day (QID) | INTRAMUSCULAR | Status: DC | PRN

## 2018-05-13 MED ORDER — LABETALOL HCL 5 MG/ML IV SOLN: 10.0000 mg | INTRAVENOUS | Status: DC | PRN

## 2018-05-13 MED ORDER — HEPARIN SODIUM (PORCINE) 1000 UNIT/ML IJ SOLN
INTRAMUSCULAR | Status: DC | PRN
  Administered 2018-05-13: 10000 [IU] via INTRAVENOUS

## 2018-05-13 MED ORDER — METHYLPREDNISOLONE SODIUM SUCC 125 MG IJ SOLR
125.0000 mg | INTRAMUSCULAR | Status: AC
  Administered 2018-05-13: 125 mg via INTRAVENOUS
  Filled 2018-05-13: qty 2

## 2018-05-13 MED ORDER — HYDRALAZINE HCL 20 MG/ML IJ SOLN: 5.0000 mg | INTRAMUSCULAR | Status: DC | PRN

## 2018-05-13 MED ORDER — DIPHENHYDRAMINE HCL 50 MG/ML IJ SOLN
25.0000 mg | INTRAMUSCULAR | Status: AC
  Administered 2018-05-13: 25 mg via INTRAVENOUS
  Filled 2018-05-13: qty 1

## 2018-05-13 MED ORDER — SODIUM CHLORIDE 0.9 % IV SOLN
INTRAVENOUS | Status: DC
  Administered 2018-05-13: 10:00:00 via INTRAVENOUS

## 2018-05-13 MED ORDER — ACETAMINOPHEN 325 MG PO TABS: 650.0000 mg | ORAL_TABLET | ORAL | Status: DC | PRN

## 2018-05-13 MED ORDER — SODIUM CHLORIDE 0.9 % WEIGHT BASED INFUSION: 1.0000 mL/kg/h | INTRAVENOUS | Status: DC

## 2018-05-13 MED ORDER — SODIUM CHLORIDE 0.9% FLUSH: 3.0000 mL | INTRAVENOUS | Status: DC | PRN

## 2018-05-13 MED ORDER — IODIXANOL 320 MG/ML IV SOLN
INTRAVENOUS | Status: DC | PRN
  Administered 2018-05-13: 180 mL via INTRA_ARTERIAL

## 2018-05-13 MED ORDER — HEPARIN (PORCINE) IN NACL 1000-0.9 UT/500ML-% IV SOLN
INTRAVENOUS | Status: AC
  Filled 2018-05-13: qty 1000

## 2018-05-13 MED ORDER — SODIUM CHLORIDE 0.9 % IV SOLN: 250.0000 mL | INTRAVENOUS | Status: DC | PRN

## 2018-05-13 MED ORDER — LIDOCAINE HCL (PF) 1 % IJ SOLN
INTRAMUSCULAR | Status: AC
  Filled 2018-05-13: qty 30

## 2018-05-13 MED ORDER — HEPARIN (PORCINE) IN NACL 1000-0.9 UT/500ML-% IV SOLN
INTRAVENOUS | Status: DC | PRN
  Administered 2018-05-13 (×2): 500 mL

## 2018-05-13 MED ORDER — PROTAMINE SULFATE 10 MG/ML IV SOLN
INTRAVENOUS | Status: DC | PRN
  Administered 2018-05-13: 10 mg via INTRAVENOUS
  Administered 2018-05-13: 15 mg via INTRAVENOUS
  Administered 2018-05-13: 5 mg via INTRAVENOUS

## 2018-05-13 MED ORDER — LIDOCAINE HCL (PF) 1 % IJ SOLN
INTRAMUSCULAR | Status: DC | PRN
  Administered 2018-05-13: 18 mL

## 2018-05-13 MED ORDER — MIDAZOLAM HCL 2 MG/2ML IJ SOLN
INTRAMUSCULAR | Status: AC
  Filled 2018-05-13: qty 2

## 2018-05-13 MED ORDER — PROTAMINE SULFATE 10 MG/ML IV SOLN
INTRAVENOUS | Status: AC
  Filled 2018-05-13: qty 5

## 2018-05-13 MED ORDER — SODIUM CHLORIDE 0.9% FLUSH: 3.0000 mL | Freq: Two times a day (BID) | INTRAVENOUS | Status: DC

## 2018-05-13 MED ORDER — FENTANYL CITRATE (PF) 100 MCG/2ML IJ SOLN
INTRAMUSCULAR | Status: DC | PRN
  Administered 2018-05-13 (×2): 25 ug via INTRAVENOUS

## 2018-05-13 SURGICAL SUPPLY — 23 items
BALLN MUSTANG 6.0X20 75 (BALLOONS) ×4
BALLN MUSTANG 6X80X75 (BALLOONS) ×4
BALLOON MUSTANG 6.0X20 75 (BALLOONS) ×3 IMPLANT
BALLOON MUSTANG 6X80X75 (BALLOONS) ×3 IMPLANT
CATH OMNI FLUSH 5F 65CM (CATHETERS) ×4 IMPLANT
CATH STRAIGHT 5FR 65CM (CATHETERS) ×4 IMPLANT
DEVICE TORQUE .025-.038 (MISCELLANEOUS) ×4 IMPLANT
GUIDEWIRE ANGLED .035X150CM (WIRE) ×4 IMPLANT
KIT ENCORE 26 ADVANTAGE (KITS) ×4 IMPLANT
KIT MICROPUNCTURE NIT STIFF (SHEATH) ×4 IMPLANT
KIT PV (KITS) ×4 IMPLANT
SHEATH FLEX ANSEL ST 6FR 45CM (SHEATH) ×4 IMPLANT
SHEATH PINNACLE 5F 10CM (SHEATH) ×4 IMPLANT
SHEATH PINNACLE 6F 10CM (SHEATH) ×4 IMPLANT
SHEATH PROBE COVER 6X72 (BAG) ×4 IMPLANT
STENT INNOVA 7X80X130 (Permanent Stent) ×4 IMPLANT
SYR MEDRAD MARK V 150ML (SYRINGE) ×4 IMPLANT
TAPE VIPERTRACK RADIOPAQ (MISCELLANEOUS) ×3 IMPLANT
TAPE VIPERTRACK RADIOPAQUE (MISCELLANEOUS) ×1
TRANSDUCER W/STOPCOCK (MISCELLANEOUS) ×4 IMPLANT
TRAY PV CATH (CUSTOM PROCEDURE TRAY) ×4 IMPLANT
WIRE BENTSON .035X145CM (WIRE) ×4 IMPLANT
WIRE ROSEN-J .035X260CM (WIRE) ×4 IMPLANT

## 2018-05-13 NOTE — H&P (Signed)
History and Physical Interval Note:  05/13/2018 10:44 AM  [name] [name]  has presented today for surgery, with the diagnosis of claudication  The various methods of treatment have been discussed with the patient and family. After consideration of risks, benefits and other options for treatment, the patient has consented to  Procedure(s): ABDOMINAL AORTOGRAM W/LOWER EXTREMITY (N/A) as a surgical intervention .  The patient's history has been reviewed, patient examined, no change in status, stable for surgery.  I have reviewed the patient's chart and labs.  Questions were answered to the patient's satisfaction.     Right groin puncture - aortogram, BLE arteriogram.  [name]  Patient name: [name] [name]         MRN: [id number]        DOB: [birth year]        Sex: male  REASON FOR CONSULT: Claudication of the bilateral lower extremities progressing to rest pain  HPI: [name] [name] is a 77 y.o. male the presents for evaluation of bilateral lower external claudication that is progressed to rest pain.  Patient has had multiple previous interventions including a right external iliac stent right superficial femoral artery stent and a left superficial femoral artery stent I will place between 2013 2014.  Patient states that the time he was having trouble walking with pain in his calves.  He states that is progressed [place] to bilateral calf pain but now he has pain in both of his thighs.  He states he can only walk about 153fet before he gets severe pain.  Moreover he states over the last month or so this is progressed to pain in both of his feet that keeps him awake at night.  All of his previous stent work [facility]  He states that he quit smoking about 40 years ago.  He cannot take an aspirin due to a history of colitis and also cannot take a statin due to statin intolerance in the past.  Dr. BNaida Sleightnote suggests that he did a left SFA atherectomy with  a turbo Hawk and subsequently deployed a 7 mm x 3 cm nitinol self-expanding stent with 0% residual SFA stenosis and three-vessel runoff in 2014.  On the right he is also had an SFA atherectomy with a diamondback and then a 6 mm x 18 mm self-expanding stent with a second 6 mm x 60 mm stent to cover the dissection as well as stenting of the right external iliac artery      Past Medical History:  Diagnosis Date  . Abdominal pain, unspecified site 10/16/2009  . Anal fissure 04/24/2008  . Arthritis   . B12 deficiency 10/11/2014  . BACK PAIN 01/10/2009  . Barrett's esophagus 1997  . BARRETTS ESOPHAGUS 08/16/2007  . BREAST HYPERTROPHY 02/16/2009  . CARBUNCLE, NECK 02/14/2008  . Cataract   . DIVERTICULOSIS, COLON 06/28/2007  . Full dentures   . GERD 06/28/2007  . GLUCOSE INTOLERANCE 08/15/2008  . Headache(784.0) 11/13/2009  . HERNIA, UMBILICAL W/OBSTRUCTION W/O GANGRENE 06/28/2007  . HTN (hypertension) 07/12/2012  . HYPERLIPIDEMIA 06/28/2007  . HYPERTENSION 06/28/2007  . Impaired glucose tolerance 04/13/2011  . INSOMNIA-SLEEP DISORDER-UNSPEC 10/10/2008  . LEG PAIN, BILATERAL 02/16/2009  . LOW BACK PAIN 06/28/2007  . MASTITIS 01/10/2009  . NECK PAIN 05/31/2008  . NEPHROLITHIASIS, HX OF 06/28/2007  . PROCTOSIGMOIDITIS, ULCERATIVE 1997  . PVD (peripheral vascular disease) with claudication, lifestyle limiting, ABI rt. 0.67, lt. 0.73 07/12/2012  . S/P angioplasty  with stent, after DB athrectomy to Rt. ext. iliac and Rt. SFA 07/12/2012  . Skin cancer    right hand  . Spinal stenosis at L4-L5 level   . Stenosis of left carotid artery, mod to severe by dopplers 09/29/2012  . TESTICULAR PAIN, RIGHT 10/25/2010  . VITAMIN D DEFICIENCY 11/13/2009         Past Surgical History:  Procedure Laterality Date  . anal fissure surgury     pt unsure of this  . ATHERECTOMY  09/28/2012  . ATHERECTOMY N/A 07/12/2012   Procedure: ATHERECTOMY;  Surgeon: [name], MD;  Location: [facility] CATH LAB;  Service:  Cardiovascular;  Laterality: N/A;  . ATHERECTOMY N/A 09/28/2012   Procedure: ATHERECTOMY;  Surgeon: [name], MD;  Location: [facility] CATH LAB;  Service: Cardiovascular;  Laterality: N/A;  . bilateral inguinal hernia with mesh and umbilical hernia repairs  09/2007  . CATARACT EXTRACTION Bilateral    bilateral  . COLONOSCOPY    . GROIN DISSECTION Right 07/20/2013   Procedure: GROIN EXPLORATION, EXCISIONAL BIOPSY RIGHT INGUINAL LYMPH NODES;  Surgeon: Harl Bowie, MD;  Location: [place];  Service: General;  Laterality: Right;  . HERNIA REPAIR    . left knee arthroscopy    . LUMBAR LAMINECTOMY/DECOMPRESSION MICRODISCECTOMY N/A 11/05/2016   Procedure: Microlumbar decompression L2-3, L3-4, L4-5, lateral mass fusion L4-5;  Surgeon: Susa Day, MD;  Location: [facility] ORS;  Service: Orthopedics;  Laterality: N/A;  . PERCUTANEOUS STENT INTERVENTION  09/28/2012   Procedure: PERCUTANEOUS STENT INTERVENTION;  Surgeon: [name], MD;  Location[facility] CATH LAB;  Service: Cardiovascular;;  . UPPER GI ENDOSCOPY           Family History  Problem Relation Age of Onset  . COPD Mother   . Cerebral aneurysm Mother   . COPD Father   . Colon cancer Neg Hx   . Esophageal cancer Neg Hx   . Rectal cancer Neg Hx   . Stomach cancer Neg Hx     SOCIAL HISTORY: Social History        Socioeconomic History  . Marital status: Divorced    Spouse name: Not on file  . Number of children: 3  . Years of education: Not on file  . Highest education level: Not on file  Occupational History  . Occupation: retired Investment banker, operational  . Financial resource strain: Not on file  . Food insecurity:    Worry: Not on file    Inability: Not on file  . Transportation needs:    Medical: Not on file    Non-medical: Not on file  Tobacco Use  . Smoking status: Former Smoker    Last attempt to quit: 09/08/1972    Years since quitting: 45.6  .  Smokeless tobacco: Current User    Types: Chew  Substance and Sexual Activity  . Alcohol use: Yes    Alcohol/week: 5.0 standard drinks    Types: 5 Cans of beer per week    Comment: 1 TO 2 BEERS PER DAY  . Drug use: No  . Sexual activity: Not Currently    Partners: Female  Lifestyle  . Physical activity:    Days per week: Not on file    Minutes per session: Not on file  . Stress: Not on file  Relationships  . Social connections:    Talks on phone: Not on file    Gets together: Not on file    Attends religious service: Not  on file    Active member of club or organization: Not on file    Attends meetings of clubs or organizations: Not on file    Relationship status: Not on file  . Intimate partner violence:    Fear of current or ex partner: Not on file    Emotionally abused: Not on file    Physically abused: Not on file    Forced sexual activity: Not on file  Other Topics Concern  . Not on file  Social History Narrative   Daily caffeine 1 cup coffee daily         Allergies  Allergen Reactions  . Contrast Media [Iodinated Diagnostic Agents]     Break out in welts  . Lipitor [Atorvastatin] Other (See Comments)    myalgias  . Lovastatin     Muscle pain  . Statins     Legs pain          Current Outpatient Medications  Medication Sig Dispense Refill  . allopurinol (ZYLOPRIM) 100 MG tablet Take 2 tablets (200 mg total) by mouth daily. 60 tablet 6  . cilostazol (PLETAL) 50 MG tablet Take 0.5 tablets (25 mg total) by mouth 2 (two) times daily. 15 tablet 3  . clopidogrel (PLAVIX) 75 MG tablet TAKE 1 TABLET BY MOUTH  DAILY 90 tablet 2  . gabapentin (NEURONTIN) 100 MG capsule Take 2 capsules (200 mg total) by mouth at bedtime. 60 capsule 3  . LIALDA 1.2 g EC tablet TAKE 2 TABLETS BY MOUTH  DAILY WITH BREAKFAST 180 tablet 12  . lisinopril-hydrochlorothiazide (PRINZIDE,ZESTORETIC) 20-25 MG tablet TAKE 1 TABLET BY MOUTH  DAILY 90  tablet 2  . pantoprazole (PROTONIX) 40 MG tablet Take 1 tablet (40 mg total) by mouth daily. 30 tablet 12  . Vitamin D, Ergocalciferol, (DRISDOL) 50000 units CAPS capsule Take 1 capsule (50,000 Units total) by mouth every 7 (seven) days. 12 capsule 0  . zolpidem (AMBIEN CR) 12.5 MG CR tablet Take 1 tablet (12.5 mg total) by mouth at bedtime as needed. for sleep 30 tablet 5  . cyanocobalamin 1000 MCG tablet Take 1,000 mcg by mouth daily. On hold for procedure     No current facility-administered medications for this visit.     REVIEW OF SYSTEMS:  [X]  denotes positive finding, [ ]  denotes negative finding Cardiac  Comments:  Chest pain or chest pressure:    Shortness of breath upon exertion:    Short of breath when lying flat:    Irregular heart rhythm:        Vascular    Pain in calf, thigh, or hip brought on by ambulation: x   Pain in feet at night that wakes you up from your sleep:  x   Blood clot in your veins:    Leg swelling:         Pulmonary    Oxygen at home:    Productive cough:     Wheezing:         Neurologic    Sudden weakness in arms or legs:     Sudden numbness in arms or legs:     Sudden onset of difficulty speaking or slurred speech:    Temporary loss of vision in one eye:     Problems with dizziness:         Gastrointestinal    Blood in stool:     Vomited blood:         Genitourinary    Burning when urinating:  Blood in urine:        Psychiatric    Major depression:         Hematologic    Bleeding problems:    Problems with blood clotting too easily:        Skin    Rashes or ulcers:        Constitutional    Fever or chills:      PHYSICAL EXAM:    Vitals:   05/04/18 1450  BP: 114/64  Pulse: 72  Resp: 16  Temp: 98 F (36.7 C)  TempSrc: Oral  SpO2: 100%  Weight: 102.5 kg  Height: 5' 8"  (1.727 m)    GENERAL: The patient is a  well-nourished male, in no acute distress. The vital signs are documented above. CARDIAC: There is a regular rate and rhythm.  VASCULAR:  2+ radial pulse palpable bilateral upper extremities. 1+ palpable right femoral pulse.  No appreciable left femoral pulse. No palpable popliteal pulse. Monophasic posterior tibial and anterior tibial signals in the bilateral lower extremities. No tissue loss. PULMONARY: There is good air exchange bilaterally without wheezing or rales. ABDOMEN: Soft and non-tender with normal pitched bowel sounds.  MUSCULOSKELETAL: There are no major deformities or cyanosis. NEUROLOGIC: No focal weakness or paresthesias are detected. SKIN: There are no ulcers or rashes noted. PSYCHIATRIC: The patient has a normal affect.  DATA:   I independently reviewed his noninvasive imaging.  On the right he had a greater than 50% stenosis of the external iliac artery with monophasic flow down the femoral and popliteal arteries with monophasic flow through the right SFA stent.  On the left he had biphasic flow through the SFA stent with no elevated velocities.  ABI 0.47 the right lower extremity, 0.68 in the left lower extremity.  Assessment/Plan:  In talking with Mr. Fullam it appears he has progressed to some early rest pain after previously suffering from bilateral lower extremity claudication.  He states his feet are now keeping him awake at night burning and cramping (denies hx neuropathy).  He has had bilateral SFA atherectomy with stent as well as a right external iliac stent by Dr. Gwenlyn Found in 2013 - 2014.  I could appreciate a right femoral pulse but had difficulty appreciating a left femoral pulse.  As a result I recommended proceeding with an aortogram and bilateral lower extremity arteriogram via right groin puncture.  I could certainly treat his iliac stenosis at that time on the right that was noted on his duplex.  We will have to evaluate the rest of his runoff at that  time and he knows we will have to return another time to treat the contralateral side if endovascular options exist.  He is on Plavix at this time and he should continue that pending our intervention.  Will arrange for next available angio day in the [facility] lab.  He is allergic to contrast and will need to be premedicated the morning of surgery.   [name], MD [facility] Office: [phone] Pager: [place]

## 2018-05-13 NOTE — Op Note (Signed)
Patient name: [name] [name] MRN: [id number] DOB: [birth year] Sex: male  05/13/2018 Pre-operative Diagnosis: Critical limb ischemia of the bilateral lower extremities with rest pain in bilateral lower extremities Post-operative diagnosis:  Same Surgeon:  [name], MD Procedure Performed: 1.  Ultrasound-guided access of the right common femoral artery 2.  Aortogram 3.  Bilateral lower extremity arteriogram with runoff 4.  Left external iliac stent placement with angioplasty (7 mm x 80 mm self-expanding Innova stent and angioplasty with 6 mm x 80 mm Mustang) 5.  76 minutes of monitored conscious sedation time  Indications: Patient is a 77 year old male that has suffered with bilateral lower extremity claudication and has undergone bilateral SFA atherectomy's as well as right external iliac stent by another provider.  Most recently complains of progression of his symptoms with concern for some early rest pain that awakens him at night.  He presents today for aortogram with bilateral lower extremity arteriogram and possible intervention.  We decided to intervene on the left leg first since this appears to be the more symptomatic side.  Findings:  1.  Aortogram reveals a moderately diseased aorta with less than 50% stenosis.  We did perform pullback pressures from the level of L1 in the aorta into the right external iliac artery at the end of the case and the pressure remained stable at 140/60 from the infrarenal aorta all the way down to the external iliac artery.  He had multilevel high-grade stenosis greater than 90% with bulky calcific disease throughout the left external iliac artery.  The left hypogastric artery was not visible. 2.  Right lower extremity arteriogram reveals moderate to high grade bulky calcification in the right common femoral artery as well as the proximal SFA.  His right SFA stents appear widely patent.  His dominant runoff in the right lower extremity is via the  anterior tibial and peroneal arteries are patent. 3.  Left lower extremity arteriogram revealed patent common femoral and profunda.  He had mild disease to moderate disease throughout the left SFA.  None of this appeared to be flow-limiting.  His dominant runoff appeared to be via the posterior tibial and peroneal artery.  The peroneal appeared very small and diminutive distally.   Procedure:  The patient was identified in the holding area and taken to room 8.  The patient was then placed supine on the table and prepped and draped in the usual sterile fashion.  A time out was called.  Ultrasound was used to evaluate the right common femoral artery.  It was really diseased and I had to stick a little higher in order to get to a patent artery.  A digital ultrasound image was acquired.  A micropuncture needle was used to access the right common femoral artery under ultrasound guidance.  An 018 wire was advanced without resistance and a micropuncture sheath was placed.  The 018 wire was removed and a benson wire was placed.  The micropuncture sheath was exchanged for a 5 french sheath.  An omniflush catheter was advanced over the wire to the level of L-1 and an aortogram was obtained.  We also obtained bilateral lower extremity runoff with our catheter in the aorta. We initially made the decision to intervene on the left external iliac multilevel high-grade greater than 90% stenosis.  We felt like treating his inflow disease on that side may alleviate his rest pain on the left side.  We ultimately accessed his contralateral common iliac artery with a Bentson wire  and Omni Flush catheter and then threaded our catheter to the left femoral head.  We then exchanged for a Rosen wire here after confirming an intraluminal injection that we were in the true lumen.  We then passed a 5 [place] Ansell sheath from the right groin over the bifurcation with our Rosen wire in place.  Planning arteriogram was obtained through  hand-injection through the sheath.  We selected a 7 mm x 80 mm Innova stent that was deployed in the left external iliac artery.  This was postdilated with a 6 mm x 80 mm Mustang.  Final injection through this showed one focal area where there was still greater than 50% stenosis from a calcific lesion.  We then treated this with a short 6 mm x 20 mm balloon to try and get increased radial force.  Ultimately we still had some waist in the balloon but less than 30%.  Another hand injected arteriogram showed quick flow through the stent.  We felt being more aggressive here to be at risk of perforating the artery.  We subsequently pulled back our sheath into the right distal external iliac artery beyond his existing iliac stent.  We then obtained a retrograde hand-injection here that showed patent stent and patent iliac system on the right.  My only concern was that patient had shown some monophasic waveforms in both external iliac arteries on his preoperative duplex.  As result I advanced my wire back into the infrarenal aorta and advance our Ansell sheath up into the infrarenal aorta at the level of L1.  We then obtained pullback pressures from the level of L1 and infrarenal aorta all the way to the distal right external iliac artery.  Ultimately the pressure was between 130 and 140/60 and did not change significantly.  This may be feel like we had appropriately treated his external iliac disease on the left and that his aortic disease was not creating significant inflow issues for him.  I think the next step for him would be a right common femoral endarterectomy if his symptoms are persistent.  We then attempted to exchange for a short 6 French sheath in the right groin.  We attempted to pull a Mynx closure device but it did not fire.  We had to hold manual pressure.    [name], MD [facility] [place] Office: [phone] Pager: [place]

## 2018-05-13 NOTE — Discharge Instructions (Signed)

## 2018-05-13 NOTE — Telephone Encounter (Signed)
sch appt lvm 06/15/18 8am ABI 845am p/o MD

## 2018-05-14 ENCOUNTER — Encounter (HOSPITAL_COMMUNITY): Payer: Self-pay | Admitting: Vascular Surgery

## 2018-05-14 ENCOUNTER — Other Ambulatory Visit: Payer: Self-pay

## 2018-05-14 DIAGNOSIS — I739 Peripheral vascular disease, unspecified: Secondary | ICD-10-CM

## 2018-05-17 ENCOUNTER — Encounter: Payer: Medicare Other | Admitting: Surgery

## 2018-05-27 ENCOUNTER — Other Ambulatory Visit: Payer: Self-pay | Admitting: Vascular Surgery

## 2018-05-27 DIAGNOSIS — I739 Peripheral vascular disease, unspecified: Secondary | ICD-10-CM

## 2018-06-01 ENCOUNTER — Ambulatory Visit: Payer: Medicare Other | Admitting: Gastroenterology

## 2018-06-03 ENCOUNTER — Encounter: Payer: Self-pay | Admitting: Gastroenterology

## 2018-06-03 ENCOUNTER — Ambulatory Visit: Payer: Medicare Other | Admitting: Gastroenterology

## 2018-06-03 ENCOUNTER — Telehealth: Payer: Self-pay | Admitting: Gastroenterology

## 2018-06-03 VITALS — BP 122/76 | HR 68 | Ht 68.0 in | Wt 224.4 lb

## 2018-06-03 DIAGNOSIS — K2270 Barrett's esophagus without dysplasia: Secondary | ICD-10-CM | POA: Diagnosis not present

## 2018-06-03 DIAGNOSIS — K51011 Ulcerative (chronic) pancolitis with rectal bleeding: Secondary | ICD-10-CM

## 2018-06-03 MED ORDER — PREDNISONE 10 MG PO TABS: ORAL_TABLET | ORAL | 0 refills | Status: DC

## 2018-06-03 NOTE — Progress Notes (Signed)
    History of Present Illness: This is a 77 year old male with history of ulcerative colitis.  He is accompanied by his daughter.  He states he would like communications on his medical problems to go through his daughter as he has a difficult time understanding and following through with instructions.  He relates for the past few months he has had problems with urgent bloody diarrhea occurring about 4-8 times per day.  He has had occasional episodes of incontinence.  He has been maintained on Lialda 2.4 g daily.   Current Medications, Allergies, Past Medical History, Past Surgical History, Family History and Social History were reviewed in [employer] record.  Physical Exam: General: Well developed, well nourished, no acute distress Head: Normocephalic and atraumatic Eyes:  sclerae anicteric, EOMI Ears: Normal auditory acuity Mouth: No deformity or lesions Lungs: Clear throughout to auscultation Heart: Regular rate and rhythm; no murmurs, rubs or bruits Abdomen: Soft, non tender and non distended. No masses, hepatosplenomegaly or hernias noted. Normal Bowel sounds Rectal: Not done Musculoskeletal: Symmetrical with no gross deformities  Pulses:  Normal pulses noted Extremities: No clubbing, cyanosis, edema or deformities noted Neurological: Alert oriented x 4, grossly nonfocal Psychological:  Alert and cooperative. Normal mood and affect   Assessment and Recommendations:  1. Ulcerative pancolitis, flare.  Increase mesalamine to 4.8 g/day.  They state that Lialda is very expensive at the current dose and he will not be able to afford it at an increased dose.  I have asked the daughter to determine which other mesalamine brands would be more affordable for him to obtain the maximal mesalamine dose daily.  Temporarily he will increase Lialda to 2.4 g p.o. twice daily.  Begin prednisone taper starting with 50 mg and decreasing by 10 mg every 5 days as outlined in patient  instructions.  If his symptoms have not improved within the next 1 to 2 weeks he is advised to call for further evaluation and management plans. REV in 3 weeks with me or an APP.  2. Barrett's esophagus.  Continue pantoprazole 40 mg p.o. daily.  Follow standard antireflux measures.  3. PVD with stent on Plavix.

## 2018-06-03 NOTE — Telephone Encounter (Signed)
Patient's daughter Arrie Aran) states she called the insurance company and the preferred medicine to replace Lialda is Apriso. Dr. Fuller Plan, what dose would you like patient to start with Apriso?

## 2018-06-03 NOTE — Patient Instructions (Addendum)
Increase your Lialda to 2.4 g (2 tablets) by mouth twice daily.   Please call your insurance company and find out what medications are covered in the place of your Lialda. Some examples of medicines are Apriso, Asacol HD, Delzicol.   We have sent the following medications to your pharmacy for you to pick up at your convenience: prednisone taper: Take 5 tablets (50 mg) by mouth daily x 5 days, then reduce to 4 tablets (40 mg ) by mouth daily x 5 days, then reduce to 3 tablets (30 mg) by mouth daily x 5 days, then reduce to 2 tablets (20 mg ) by mouth daily x 5 days, then reduce to 1 tablet by mouth daily x 5 days, then reduce to 1 tablet by mouth every other day x 1 week, then stop.  Please call back in 1-2 weeks if your symptoms have not improved at all.   Thank you for choosing me and [facility].  [name]. Dagoberto Ligas., MD., [facility]

## 2018-06-04 NOTE — Telephone Encounter (Signed)
Complete remaining supply of Lialda taking 2.4g po bid then begin Apriso 1.5g qd, 1 year of refills

## 2018-06-07 MED ORDER — MESALAMINE ER 0.375 G PO CP24: 1.5000 g | ORAL_CAPSULE | Freq: Every day | ORAL | 0 refills | Status: DC

## 2018-06-07 NOTE — Telephone Encounter (Signed)
Left a message for [name] [name] stating patient needs to continue Lialda 2.4 g twice daily until finished and then fill Apriso 1.5 g daily. Also told her I gave her dad per her request a temporary supply to make sure this medication is helping before we send it to his mail order pharmacy.

## 2018-06-08 ENCOUNTER — Ambulatory Visit (INDEPENDENT_AMBULATORY_CARE_PROVIDER_SITE_OTHER): Payer: Medicare Other | Admitting: Internal Medicine

## 2018-06-08 ENCOUNTER — Encounter: Payer: Self-pay | Admitting: Internal Medicine

## 2018-06-08 VITALS — BP 128/76 | HR 72 | Temp 98.0°F | Ht 68.0 in | Wt 226.0 lb

## 2018-06-08 DIAGNOSIS — Z23 Encounter for immunization: Secondary | ICD-10-CM

## 2018-06-08 DIAGNOSIS — F329 Major depressive disorder, single episode, unspecified: Secondary | ICD-10-CM | POA: Diagnosis not present

## 2018-06-08 DIAGNOSIS — F32A Depression, unspecified: Secondary | ICD-10-CM

## 2018-06-08 DIAGNOSIS — R7302 Impaired glucose tolerance (oral): Secondary | ICD-10-CM | POA: Diagnosis not present

## 2018-06-08 DIAGNOSIS — G4700 Insomnia, unspecified: Secondary | ICD-10-CM | POA: Diagnosis not present

## 2018-06-08 MED ORDER — TEMAZEPAM 30 MG PO CAPS: 30.0000 mg | ORAL_CAPSULE | Freq: Every evening | ORAL | 2 refills | Status: DC | PRN

## 2018-06-08 NOTE — Patient Instructions (Signed)
OK to stop the Ambien Cr 12.5  Please take all new medication as prescribed - the temazepam 30 mg at bedtime if needed  Please continue all other medications as before, and refills have been done if requested.  Please have the pharmacy call with any other refills you may need.  Please keep your appointments with your specialists as you may have planned

## 2018-06-08 NOTE — Assessment & Plan Note (Signed)
stable overall by history and exam, recent data reviewed with pt, and pt to continue medical treatment as before,  to f/u any worsening symptoms or concerns  

## 2018-06-08 NOTE — Progress Notes (Signed)
Subjective:    Patient ID: [name] [name], male    DOB: [birth year], 77 y.o.   MRN: [id number]  HPI  Here to f/u; overall doing ok,  Pt denies chest pain, increasing sob or doe, wheezing, orthopnea, PND, increased LE swelling, palpitations, dizziness or syncope.  Pt denies new neurological symptoms such as new headache, or facial or extremity weakness or numbness.  Pt denies polydipsia, polyuria, or low sugar episode.  Pt states overall good compliance with meds, mostly trying to follow appropriate diet, with wt overall stable. Seeing GI recently now on prednisone and Vascular with planned f/u after recent vascular study;  Major c/o today is unable to sleep last few nights, not sure if prednisone is partly to blame, but ambien cr 12 5 not working.  Denies worsening depressive symptoms, suicidal ideation, or panic Past Medical History:  Diagnosis Date  . Abdominal pain, unspecified site 10/16/2009  . Anal fissure 04/24/2008  . Arthritis   . B12 deficiency 10/11/2014  . BACK PAIN 01/10/2009  . Barrett's esophagus 1997  . BARRETTS ESOPHAGUS 08/16/2007  . BREAST HYPERTROPHY 02/16/2009  . CARBUNCLE, NECK 02/14/2008  . Cataract   . DIVERTICULOSIS, COLON 06/28/2007  . Full dentures   . GERD 06/28/2007  . GLUCOSE INTOLERANCE 08/15/2008  . Headache(784.0) 11/13/2009  . HERNIA, UMBILICAL W/OBSTRUCTION W/O GANGRENE 06/28/2007  . HTN (hypertension) 07/12/2012  . HYPERLIPIDEMIA 06/28/2007  . HYPERTENSION 06/28/2007  . Impaired glucose tolerance 04/13/2011  . INSOMNIA-SLEEP DISORDER-UNSPEC 10/10/2008  . LEG PAIN, BILATERAL 02/16/2009  . LOW BACK PAIN 06/28/2007  . MASTITIS 01/10/2009  . NECK PAIN 05/31/2008  . NEPHROLITHIASIS, HX OF 06/28/2007  . PROCTOSIGMOIDITIS, ULCERATIVE 1997  . PVD (peripheral vascular disease) with claudication, lifestyle limiting, ABI rt. 0.67, lt. 0.73 07/12/2012  . S/P angioplasty with stent, after DB athrectomy to Rt. ext. iliac and Rt. SFA 07/12/2012  . Skin cancer    right hand  .  Spinal stenosis at L4-L5 level   . Stenosis of left carotid artery, mod to severe by dopplers 09/29/2012  . TESTICULAR PAIN, RIGHT 10/25/2010  . VITAMIN D DEFICIENCY 11/13/2009   Past Surgical History:  Procedure Laterality Date  . ABDOMINAL AORTOGRAM N/A 05/13/2018   Procedure: ABDOMINAL AORTOGRAM;  Surgeon: [name], MD;  Location: [facility];  Service: Cardiovascular;  Laterality: N/A;  . anal fissure surgury     pt unsure of this  . ATHERECTOMY  09/28/2012  . ATHERECTOMY N/A 07/12/2012   Procedure: ATHERECTOMY;  Surgeon: [name], MD;  Location: [facility] CATH LAB;  Service: Cardiovascular;  Laterality: N/A;  . ATHERECTOMY N/A 09/28/2012   Procedure: ATHERECTOMY;  Surgeon: [name], MD;  Location: [facility] CATH LAB;  Service: Cardiovascular;  Laterality: N/A;  . bilateral inguinal hernia with mesh and umbilical hernia repairs  09/2007  . CATARACT EXTRACTION Bilateral    bilateral  . COLONOSCOPY    . GROIN DISSECTION Right 07/20/2013   Procedure: GROIN EXPLORATION, EXCISIONAL BIOPSY RIGHT INGUINAL LYMPH NODES;  Surgeon: Harl Bowie, MD;  Location: [facility];  Service: General;  Laterality: Right;  . HERNIA REPAIR    . left knee arthroscopy    . LOWER EXTREMITY ANGIOGRAPHY Bilateral 05/13/2018   Procedure: Lower Extremity Angiography;  Surgeon: [name], MD;  Location: [place] [facility];  Service: Cardiovascular;  Laterality: Bilateral;  . LUMBAR LAMINECTOMY/DECOMPRESSION MICRODISCECTOMY N/A 11/05/2016   Procedure: Microlumbar decompression L2-3, L3-4, L4-5, lateral mass fusion L4-5;  Surgeon: Susa Day,  MD;  Location: [facility];  Service: Orthopedics;  Laterality: N/A;  . PERCUTANEOUS STENT INTERVENTION  09/28/2012   Procedure: PERCUTANEOUS STENT INTERVENTION;  Surgeon: [name], MD;  Location: [facility] CATH LAB;  Service: Cardiovascular;;  . PERIPHERAL VASCULAR INTERVENTION  05/13/2018   Procedure: PERIPHERAL VASCULAR INTERVENTION;   Surgeon: [name], MD;  Location: [place] [facility];  Service: Cardiovascular;;  Ext. Iliac  . UPPER GI ENDOSCOPY      reports that he quit smoking about 45 years ago. He has quit using smokeless tobacco.  His smokeless tobacco use included chew. He reports that he drank about 5.0 standard drinks of alcohol per week. He reports that he does not use drugs. family history includes COPD in his father and mother; Cerebral aneurysm in his mother. Allergies  Allergen Reactions  . Contrast Media [Iodinated Diagnostic Agents] Other (See Comments)    Break out in welts  . Statins Other (See Comments)    Legs pain: Atorvastatin and Lovastatin   Current Outpatient Medications on File Prior to Visit  Medication Sig Dispense Refill  . allopurinol (ZYLOPRIM) 100 MG tablet Take 2 tablets (200 mg total) by mouth daily. 180 tablet 6  . clopidogrel (PLAVIX) 75 MG tablet TAKE 1 TABLET BY MOUTH  DAILY (Patient taking differently: Take 75 mg by mouth daily. ) 90 tablet 2  . cyanocobalamin 1000 MCG tablet Take 1,000 mcg by mouth daily.     [name] lisinopril-hydrochlorothiazide (PRINZIDE,ZESTORETIC) 20-25 MG tablet TAKE 1 TABLET BY MOUTH  DAILY 90 tablet 2  . loperamide (IMODIUM) 2 MG capsule Take by mouth as needed for diarrhea or loose stools.    . mesalamine (APRISO) 0.375 g 24 hr capsule Take 4 capsules (1.5 g total) by mouth daily. 120 capsule 0  . pantoprazole (PROTONIX) 40 MG tablet Take 1 tablet (40 mg total) by mouth daily. 30 tablet 12  . predniSONE (DELTASONE) 10 MG tablet Take 5 tablets (50 mg) by mouth daily x 5 days, then reduce to 4 tablets (40 mg ) by mouth daily x 5 days, then reduce to 3 tablets (30 mg) by mouth daily x 5 days, then reduce to 2 tablets (20 mg ) by mouth daily x 5 days, then reduce to 1 tablet by mouth daily x 5 days, then reduce to 1 tablet by mouth every other day x 5 days, then stop. 80 tablet 0  . Vitamin D, Ergocalciferol, (DRISDOL) 50000 units CAPS capsule Take 1  capsule (50,000 Units total) by mouth every 7 (seven) days. 12 capsule 0  . zolpidem (AMBIEN CR) 12.5 MG CR tablet Take 1 tablet (12.5 mg total) by mouth at bedtime as needed. for sleep (Patient taking differently: Take 12.5 mg by mouth at bedtime. for sleep) 30 tablet 5   No current facility-administered medications on file prior to visit.    Review of Systems  Constitutional: Negative for other unusual diaphoresis or sweats HENT: Negative for ear discharge or swelling Eyes: Negative for other worsening visual disturbances Respiratory: Negative for stridor or other swelling  Gastrointestinal: Negative for worsening distension or other blood Genitourinary: Negative for retention or other urinary change Musculoskeletal: Negative for other MSK pain or swelling Skin: Negative for color change or other new lesions Neurological: Negative for worsening tremors and other numbness  Psychiatric/Behavioral: Negative for worsening agitation or other fatigue All other system neg per pt    Objective:   Physical Exam BP 128/76   Pulse 72   Temp 98 F (36.7  C) (Oral)   Ht 5' 8"  (1.727 m)   Wt 226 lb (102.5 kg)   SpO2 95%   BMI 34.36 kg/m  VS noted,  Constitutional: Pt appears in NAD HENT: Head: NCAT.  Right Ear: External ear normal.  Left Ear: External ear normal.  Eyes: . Pupils are equal, round, and reactive to light. Conjunctivae and EOM are normal Nose: without d/c or deformity Neck: Neck supple. Gross normal ROM Cardiovascular: Normal rate and regular rhythm.   Pulmonary/Chest: Effort normal and breath sounds without rales or wheezing.  Abd:  Soft, NT, ND, + BS, no organomegaly Neurological: Pt is alert. At baseline orientation, motor grossly intact Skin: Skin is warm. No rashes, other new lesions, no LE edema Psychiatric: Pt behavior is normal without agitation  No other exam findings Lab Results  Component Value Date   WBC 7.6 04/09/2018   HGB 16.0 05/13/2018   HCT 47.0  05/13/2018   PLT 202.0 04/09/2018   GLUCOSE 115 (H) 05/13/2018   CHOL 168 12/28/2017   TRIG 119.0 12/28/2017   HDL 32.00 (L) 12/28/2017   LDLDIRECT 184.7 10/14/2013   LDLCALC 112 (H) 12/28/2017   ALT 16 04/09/2018   AST 13 04/09/2018   NA 136 05/13/2018   K 4.2 05/13/2018   CL 108 05/13/2018   CREATININE 1.00 05/13/2018   BUN 20 05/13/2018   CO2 29 04/09/2018   TSH 1.30 12/28/2017   PSA 1.45 12/28/2017   HGBA1C 5.9 12/28/2017   MICROALBUR 1.4 10/14/2010       Assessment & Plan:

## 2018-06-08 NOTE — Assessment & Plan Note (Signed)
Ok for change ambien cr to temazepam qhs prn,  to f/u any worsening symptoms or concerns

## 2018-06-09 ENCOUNTER — Ambulatory Visit: Payer: Medicare Other | Admitting: Internal Medicine

## 2018-06-15 ENCOUNTER — Ambulatory Visit (INDEPENDENT_AMBULATORY_CARE_PROVIDER_SITE_OTHER): Payer: Medicare Other | Admitting: Vascular Surgery

## 2018-06-15 ENCOUNTER — Ambulatory Visit (HOSPITAL_COMMUNITY)
Admission: RE | Admit: 2018-06-15 | Discharge: 2018-06-15 | Disposition: A | Discharge status: Home / Self Care | Payer: Medicare Other | Source: Ambulatory Visit | Attending: Vascular Surgery | Admitting: Vascular Surgery

## 2018-06-15 ENCOUNTER — Encounter: Payer: Self-pay | Admitting: Vascular Surgery

## 2018-06-15 ENCOUNTER — Other Ambulatory Visit: Payer: Self-pay

## 2018-06-15 ENCOUNTER — Encounter: Payer: Self-pay | Admitting: *Deleted

## 2018-06-15 VITALS — BP 151/74 | HR 65 | Temp 97.2°F | Resp 18 | Ht 68.0 in | Wt 224.0 lb

## 2018-06-15 DIAGNOSIS — I739 Peripheral vascular disease, unspecified: Secondary | ICD-10-CM | POA: Diagnosis not present

## 2018-06-15 NOTE — Progress Notes (Signed)
Patient name: [name] [name] MRN: [id number] DOB: 04-Apr-1941 Sex: male  REASON FOR VISIT: Follow-up status post left external iliac angioplasty/stent  HPI: [name] [name] is a 77 y.o. male with history of hypertension and hyperlipidemia who presents for interval follow-up after left external iliac angioplasty and stent placement.  Patient previous had lifestyle limiting claudication in both legs and states he could not walk more than 100 feet given bilateral burning calf pain.  Since intervention on the left he states he can now walk to his mailbox and back.  He's had significant improvement in his left leg symptoms.  Unfortunately his right leg still remains fairly symptomatic and still has lifestyle limiting claudication in his right leg.  He denies any rest pain or tissue loss in the right foot.  He is on Plavix.  Regarding his cardiac risk he did have a Lexiscan on 09/30/2016 that was normal with no ischemia and an EF of 57%.  No new CP or SOB.   Past Medical History:  Diagnosis Date  . Abdominal pain, unspecified site 10/16/2009  . Anal fissure 04/24/2008  . Arthritis   . B12 deficiency 10/11/2014  . BACK PAIN 01/10/2009  . Barrett's esophagus 1997  . BARRETTS ESOPHAGUS 08/16/2007  . BREAST HYPERTROPHY 02/16/2009  . CARBUNCLE, NECK 02/14/2008  . Cataract   . DIVERTICULOSIS, COLON 06/28/2007  . Full dentures   . GERD 06/28/2007  . GLUCOSE INTOLERANCE 08/15/2008  . Headache(784.0) 11/13/2009  . HERNIA, UMBILICAL W/OBSTRUCTION W/O GANGRENE 06/28/2007  . HTN (hypertension) 07/12/2012  . HYPERLIPIDEMIA 06/28/2007  . HYPERTENSION 06/28/2007  . Impaired glucose tolerance 04/13/2011  . INSOMNIA-SLEEP DISORDER-UNSPEC 10/10/2008  . LEG PAIN, BILATERAL 02/16/2009  . LOW BACK PAIN 06/28/2007  . MASTITIS 01/10/2009  . NECK PAIN 05/31/2008  . NEPHROLITHIASIS, HX OF 06/28/2007  . PROCTOSIGMOIDITIS, ULCERATIVE 1997  . PVD (peripheral vascular disease) with claudication, lifestyle limiting, ABI rt. 0.67, lt.  0.73 07/12/2012  . S/P angioplasty with stent, after DB athrectomy to Rt. ext. iliac and Rt. SFA 07/12/2012  . Skin cancer    right hand  . Spinal stenosis at L4-L5 level   . Stenosis of left carotid artery, mod to severe by dopplers 09/29/2012  . TESTICULAR PAIN, RIGHT 10/25/2010  . VITAMIN D DEFICIENCY 11/13/2009    Past Surgical History:  Procedure Laterality Date  . ABDOMINAL AORTOGRAM N/A 05/13/2018   Procedure: ABDOMINAL AORTOGRAM;  Surgeon: [name], MD;  Location: [place] [facility];  Service: Cardiovascular;  Laterality: N/A;  . anal fissure surgury     pt unsure of this  . ATHERECTOMY  09/28/2012  . ATHERECTOMY N/A 07/12/2012   Procedure: ATHERECTOMY;  Surgeon: [name], MD;  Location: [facility] CATH LAB;  Service: Cardiovascular;  Laterality: N/A;  . ATHERECTOMY N/A 09/28/2012   Procedure: ATHERECTOMY;  Surgeon: [name], MD;  Location: [facility] CATH LAB;  Service: Cardiovascular;  Laterality: N/A;  . bilateral inguinal hernia with mesh and umbilical hernia repairs  09/2007  . CATARACT EXTRACTION Bilateral    bilateral  . COLONOSCOPY    . GROIN DISSECTION Right 07/20/2013   Procedure: GROIN EXPLORATION, EXCISIONAL BIOPSY RIGHT INGUINAL LYMPH NODES;  Surgeon: Harl Bowie, MD;  Location: [place];  Service: General;  Laterality: Right;  . HERNIA REPAIR    . left knee arthroscopy    . LOWER EXTREMITY ANGIOGRAPHY Bilateral 05/13/2018   Procedure: Lower Extremity Angiography;  Surgeon: [name], MD;  Location: [place] [facility];  Service: Cardiovascular;  Laterality: Bilateral;  . LUMBAR LAMINECTOMY/DECOMPRESSION MICRODISCECTOMY N/A 11/05/2016   Procedure: Microlumbar decompression L2-3, L3-4, L4-5, lateral mass fusion L4-5;  Surgeon: Susa Day, MD;  Location: [facility];  Service: Orthopedics;  Laterality: N/A;  . PERCUTANEOUS STENT INTERVENTION  09/28/2012   Procedure: PERCUTANEOUS STENT INTERVENTION;  Surgeon: [name], MD;   Location: [facility] CATH LAB;  Service: Cardiovascular;;  . PERIPHERAL VASCULAR INTERVENTION  05/13/2018   Procedure: PERIPHERAL VASCULAR INTERVENTION;  Surgeon: [name], MD;  Location: [place] [facility];  Service: Cardiovascular;;  Ext. Iliac  . UPPER GI ENDOSCOPY      Family History  Problem Relation Age of Onset  . COPD Mother   . Cerebral aneurysm Mother   . COPD Father   . Colon cancer Neg Hx   . Esophageal cancer Neg Hx   . Rectal cancer Neg Hx   . Stomach cancer Neg Hx     SOCIAL HISTORY: Social History   Tobacco Use  . Smoking status: Former Smoker    Last attempt to quit: 09/08/1972    Years since quitting: 45.7  . Smokeless tobacco: Former Systems developer    Types: Chew  Substance Use Topics  . Alcohol use: Not Currently    Alcohol/week: 5.0 standard drinks    Types: 5 Cans of beer per week    Allergies  Allergen Reactions  . Contrast Media [Iodinated Diagnostic Agents] Other (See Comments)    Break out in welts  . Statins Other (See Comments)    Legs pain: Atorvastatin and Lovastatin    Current Outpatient Medications  Medication Sig Dispense Refill  . allopurinol (ZYLOPRIM) 100 MG tablet Take 2 tablets (200 mg total) by mouth daily. 180 tablet 6  . clopidogrel (PLAVIX) 75 MG tablet TAKE 1 TABLET BY MOUTH  DAILY (Patient taking differently: Take 75 mg by mouth daily. ) 90 tablet 2  . cyanocobalamin 1000 MCG tablet Take 1,000 mcg by mouth daily.     [name] lisinopril-hydrochlorothiazide (PRINZIDE,ZESTORETIC) 20-25 MG tablet TAKE 1 TABLET BY MOUTH  DAILY 90 tablet 2  . loperamide (IMODIUM) 2 MG capsule Take by mouth as needed for diarrhea or loose stools.    . mesalamine (APRISO) 0.375 g 24 hr capsule Take 4 capsules (1.5 g total) by mouth daily. 120 capsule 0  . pantoprazole (PROTONIX) 40 MG tablet Take 1 tablet (40 mg total) by mouth daily. 30 tablet 12  . predniSONE (DELTASONE) 10 MG tablet Take 5 tablets (50 mg) by mouth daily x 5 days, then reduce to 4 tablets (40 mg  ) by mouth daily x 5 days, then reduce to 3 tablets (30 mg) by mouth daily x 5 days, then reduce to 2 tablets (20 mg ) by mouth daily x 5 days, then reduce to 1 tablet by mouth daily x 5 days, then reduce to 1 tablet by mouth every other day x 5 days, then stop. 80 tablet 0  . temazepam (RESTORIL) 30 MG capsule Take 1 capsule (30 mg total) by mouth at bedtime as needed for sleep. 30 capsule 2  . Vitamin D, Ergocalciferol, (DRISDOL) 50000 units CAPS capsule Take 1 capsule (50,000 Units total) by mouth every 7 (seven) days. 12 capsule 0   No current facility-administered medications for this visit.     REVIEW OF SYSTEMS:  [X]  denotes positive finding, [ ]  denotes negative finding Cardiac  Comments:  Chest pain or chest pressure:    Shortness of breath upon exertion:  Short of breath when lying flat:    Irregular heart rhythm:        Vascular    Pain in calf, thigh, or hip brought on by ambulation: x Right leg (left leg resolved)  Pain in feet at night that wakes you up from your sleep:     Blood clot in your veins:    Leg swelling:         Pulmonary    Oxygen at home:    Productive cough:     Wheezing:         Neurologic    Sudden weakness in arms or legs:     Sudden numbness in arms or legs:     Sudden onset of difficulty speaking or slurred speech:    Temporary loss of vision in one eye:     Problems with dizziness:         Gastrointestinal    Blood in stool:     Vomited blood:         Genitourinary    Burning when urinating:     Blood in urine:        Psychiatric    Major depression:         Hematologic    Bleeding problems:    Problems with blood clotting too easily:        Skin    Rashes or ulcers:        Constitutional    Fever or chills:      PHYSICAL EXAM: Vitals:   06/15/18 0826 06/15/18 0828  BP: (!) 152/69 (!) 151/74  Pulse: 65   Resp: 18   Temp: (!) 97.2 F (36.2 C)   TempSrc: Oral   SpO2: 100%   Weight: 101.6 kg   Height: 5' 8"  (1.727 m)      GENERAL: The patient is a well-nourished male, in no acute distress. The vital signs are documented above. CARDIAC: There is a regular rate and rhythm.  VASCULAR:  2+ femoral pulse palpable left groin Weak right femoral pulse barely palpable Triphasic left PT signal Monophasic right PT signal PULMONARY: There is good air exchange bilaterally without wheezing or rales. ABDOMEN: Soft and non-tender with normal pitched bowel sounds.  MUSCULOSKELETAL: There are no major deformities or cyanosis. NEUROLOGIC: No focal weakness or paresthesias are detected. SKIN: There are no ulcers or rashes noted. PSYCHIATRIC: The patient has a normal affect.  DATA:   I had independently reviewed his noninvasive imaging.  His left ABIs have improved from 0.68 to 0.88 now with a triphasic signal from previous monophasic signal.  His right ABI is 0.49 with a monophasic signal.  Assessment/Plan:  Mr. Cork appears to be doing much better with his left leg after left external iliac angioplasty and stent placement.  Unfortunately he still has lifestyle limiting claudication in his right leg (we did not intervene on the right leg).  I reviewed his angiogram imaging and he needs a right common femoral endarterectomy.  Patient states he's seen such significant improvement in his left leg that he wants to proceed with right femoral endarterectomy to try to improve the symptoms in his right leg.  We will go ahead and plan for right femoral endarterectomy and he can continue his Plavix perioperative.  Discussed risks of bleeding, infection, wound breakdown, vessel injury, etc.  [name], MD [facility] Office: [phone] Pager: [place]

## 2018-06-17 ENCOUNTER — Other Ambulatory Visit: Payer: Self-pay | Admitting: Internal Medicine

## 2018-06-21 ENCOUNTER — Telehealth: Payer: Self-pay | Admitting: Internal Medicine

## 2018-06-21 MED ORDER — ZOLPIDEM TARTRATE 10 MG PO TABS: 10.0000 mg | ORAL_TABLET | Freq: Every evening | ORAL | 2 refills | Status: DC | PRN

## 2018-06-21 NOTE — Addendum Note (Signed)
Addended by: Biagio Borg on: 10-21-2017 08:43 PM   Modules accepted: Orders

## 2018-06-21 NOTE — Telephone Encounter (Signed)
Copied from [place] [id number]. Topic: General - Other >> Jun 21, 2018  1:19 PM Yvette Rack wrote: Reason for CRM: Pt daughter Arrie Aran [phone] calling stating that the temazepam (RESTORIL) 30 MG capsule didn't work at all he want to try the Ambien at least he will get 2-3 hours of sleep  [place], [place] - [place] [phone] (Phone) [phone] (Fax)

## 2018-06-21 NOTE — Telephone Encounter (Signed)
Done erx 

## 2018-06-21 NOTE — Telephone Encounter (Signed)
Please advise 

## 2018-06-22 NOTE — Telephone Encounter (Signed)
Clarification given.  Ok to not fill restoril since med was changed by PCP.

## 2018-06-22 NOTE — Telephone Encounter (Signed)
[name] [name] calling from [name] and wanted to know if she needs to cancel the refills on temazepam (RESTORIL) 30 MG capsule [[id number]]. Please advise

## 2018-06-24 ENCOUNTER — Ambulatory Visit: Payer: Medicare Other | Admitting: Physician Assistant

## 2018-06-29 ENCOUNTER — Encounter: Payer: Self-pay | Admitting: Physician Assistant

## 2018-06-29 ENCOUNTER — Ambulatory Visit: Payer: Medicare Other | Admitting: Physician Assistant

## 2018-06-29 VITALS — BP 140/68 | HR 80 | Ht 68.0 in | Wt 226.0 lb

## 2018-06-29 DIAGNOSIS — K51511 Left sided colitis with rectal bleeding: Secondary | ICD-10-CM | POA: Diagnosis not present

## 2018-06-29 MED ORDER — MESALAMINE ER 0.375 G PO CP24: 1.5000 g | ORAL_CAPSULE | Freq: Every day | ORAL | 3 refills | Status: DC

## 2018-06-29 NOTE — Progress Notes (Signed)
Subjective:    Patient ID: [name] [name], male    DOB: 1941-08-15, 77 y.o.   MRN: [id number]  HPI [name] [name] is a pleasant 77 year old white male, known to Dr. Fuller Plan who comes in for one-month follow-up after being seen by Dr. Fuller Plan on 06/03/2018 with acute exacerbation of known ulcerative colitis.  He had been previously in remission on Lialda 2.4 g daily. Lialda was becoming prohibitively expensive, he has been switched to Apriso 0.375 mg, 4 tablets daily.  And was also started on a prednisone taper, starting with 50 mg daily x5 days and then decreasing 10 mg every 5 days. He had been having more than 8 bowel movements per day, all loose and all containing some blood. He definitely has improved.  He says he has not seen any blood in the past couple of weeks, and stools are now formed.  He has no complaints of abdominal pain or cramping ,and his daughter says he is now able to leave the house which he could not do previously due to urgency. He is having 3-4 bowel movements per day.  His daughter says he has been eating a lot on the prednisone and she thinks this may be making him have more frequent bowel movements.  Last colonoscopy was in April 2017 with finding of mild left-sided ulcerative colitis moderate diverticulosis.  Biopsies from the right colon showed no active colitis, there was no dysplasia in any of the biopsies.  Plan is for follow-up in May 2020 area He also has history of Barrett's esophagus with last EGD in 2015-path showed Barrett's without dysplasia, and plan was for 5-year follow-up which can also be done spring 2020.  Review of Systems Pertinent positive and negative review of systems were noted in the above HPI section.  All other review of systems was otherwise negative.  Outpatient Encounter Medications as of 06/29/2018  Medication Sig  . allopurinol (ZYLOPRIM) 100 MG tablet Take 2 tablets (200 mg total) by mouth daily.  . clopidogrel (PLAVIX) 75 MG tablet TAKE 1 TABLET BY  MOUTH  DAILY (Patient taking differently: Take 75 mg by mouth daily. )  . cyanocobalamin 1000 MCG tablet Take 1,000 mcg by mouth daily.   [name] lisinopril-hydrochlorothiazide (PRINZIDE,ZESTORETIC) 20-25 MG tablet TAKE 1 TABLET BY MOUTH  DAILY  . mesalamine (APRISO) 0.375 g 24 hr capsule Take 4 capsules (1.5 g total) by mouth daily. Take 4 tablets all at one time.  . pantoprazole (PROTONIX) 40 MG tablet Take 1 tablet (40 mg total) by mouth daily.  . predniSONE (DELTASONE) 10 MG tablet Take 5 tablets (50 mg) by mouth daily x 5 days, then reduce to 4 tablets (40 mg ) by mouth daily x 5 days, then reduce to 3 tablets (30 mg) by mouth daily x 5 days, then reduce to 2 tablets (20 mg ) by mouth daily x 5 days, then reduce to 1 tablet by mouth daily x 5 days, then reduce to 1 tablet by mouth every other day x 5 days, then stop.  . temazepam (RESTORIL) 30 MG capsule Take 1 capsule (30 mg total) by mouth at bedtime as needed for sleep.  . Vitamin D, Ergocalciferol, (DRISDOL) 50000 units CAPS capsule Take 1 capsule (50,000 Units total) by mouth every 7 (seven) days.  [name] zolpidem (AMBIEN) 10 MG tablet Take 1 tablet (10 mg total) by mouth at bedtime as needed for sleep.  . [DISCONTINUED] mesalamine (APRISO) 0.375 g 24 hr capsule Take 4 capsules (1.5 g total) by  mouth daily.  . [DISCONTINUED] loperamide (IMODIUM) 2 MG capsule Take by mouth as needed for diarrhea or loose stools.   No facility-administered encounter medications on file as of 06/29/2018.    Allergies  Allergen Reactions  . Contrast Media [Iodinated Diagnostic Agents] Other (See Comments)    Break out in welts  . Statins Other (See Comments)    Legs pain: Atorvastatin and Lovastatin   Patient Active Problem List   Diagnosis Date Noted  . PVD (peripheral vascular disease) ([place]) 05/04/2018  . Spinal stenosis of lumbar region 11/05/2016  . Spinal stenosis at L4-L5 level 11/05/2016  . Mastalgia 04/10/2016  . B12 deficiency 10/11/2014  . Bilateral  leg pain 10/10/2014  . Double vision with both eyes open 10/13/2013  . Stenosis of left carotid artery, mod to severe by dopplers 09/29/2012  . S/P angioplasty with stent, and TurboHawk athrectomy mid lt. SFA for life style limiting claudication 09/28/12 09/29/2012  . PVD (peripheral vascular disease) with claudication, lifestyle limiting, ABI rt. 0.67, lt. 0.73 07/12/2012  . Thromboembolism, distal  Rt SFA  treated with aspiration thrombectomy, 07/12/12 07/12/2012  . S/P insertion of iliac artery stent, external iliac and RT SFA  with DB athrectomy 07/12/12 07/12/2012  . Impaired glucose tolerance 04/13/2011  . Preventative health care 04/13/2011  . Chronic universal ulcerative colitis ([place]) 11/14/2009  . VITAMIN D DEFICIENCY 11/13/2009  . Depression 10/16/2009  . SHOULDER PAIN, LEFT 10/16/2009  . HIP PAIN, RIGHT 10/16/2009  . Gynecomastia 02/16/2009  . BACK PAIN 01/10/2009  . INSOMNIA-SLEEP DISORDER-UNSPEC 10/10/2008  . Chest pain 10/10/2008  . NECK PAIN 05/31/2008  . Hx of ulcerative colitis 04/24/2008  . Anal fissure 04/24/2008  . Personal history of colonic polyps 04/20/2008  . CARBUNCLE, NECK 02/14/2008  . BARRETTS ESOPHAGUS 08/16/2007  . Hyperlipidemia 06/28/2007  . Essential hypertension 06/28/2007  . GERD 06/28/2007  . DIVERTICULOSIS, COLON 06/28/2007  . Lower back pain 06/28/2007  . NEPHROLITHIASIS, HX OF 06/28/2007   Social History   Socioeconomic History  . Marital status: Divorced    Spouse name: Not on file  . Number of children: 3  . Years of education: Not on file  . Highest education level: Not on file  Occupational History  . Occupation: retired Investment banker, operational  . Financial resource strain: Not on file  . Food insecurity:    Worry: Not on file    Inability: Not on file  . Transportation needs:    Medical: Not on file    Non-medical: Not on file  Tobacco Use  . Smoking status: Former Smoker    Last attempt to quit: 09/08/1972    Years since  quitting: 45.8  . Smokeless tobacco: Former Systems developer    Types: Chew  Substance and Sexual Activity  . Alcohol use: Not Currently    Alcohol/week: 5.0 standard drinks    Types: 5 Cans of beer per week  . Drug use: No  . Sexual activity: Not Currently    Partners: Female  Lifestyle  . Physical activity:    Days per week: Not on file    Minutes per session: Not on file  . Stress: Not on file  Relationships  . Social connections:    Talks on phone: Not on file    Gets together: Not on file    Attends religious service: Not on file    Active member of club or organization: Not on file    Attends meetings of clubs or organizations: Not on  file    Relationship status: Not on file  . Intimate partner violence:    Fear of current or ex partner: Not on file    Emotionally abused: Not on file    Physically abused: Not on file    Forced sexual activity: Not on file  Other Topics Concern  . Not on file  Social History Narrative   Daily caffeine 1 cup coffee daily    Mr. Sailors's family history includes COPD in his father and mother; Cerebral aneurysm in his mother.      Objective:    Vitals:   06/29/18 1410  BP: 140/68  Pulse: 80    Physical Exam; well-developed elderly white male in no acute distress, pleasant accompanied by his daughter blood pressure 140/68, pulse 80, BMI 34.3.  HEENT; nontraumatic normocephalic EOMI PERRLA sclera anicteric oral mucosa moist, Cardiovascular; regular rate and rhythm with S1-S2 no murmur rub or gallop, Pulmonary ;clear bilaterally, Abdomen ;obese, soft, nontender nondistended bowel sounds are active no palpable mass or hepatosplenomegaly, Rectal ;exam not done, extremities no clubbing cyanosis or edema skin warm dry, neuro/ psych; alert and oriented, grossly nonfocal mood and affect appropriate ;      Assessment & Plan:   #43 77 year old white male with ulcerative colitis, greater than 10 years had an acute exacerbation and is currently finishing  a steroid taper. Mesalamine has been changed to Apriso 0.375 mg 4 tablets daily rated  He is doing much better, having formed stools usually 3 to 4/day and no bleeding over the past 2 weeks  Will be due for surveillance colonoscopy May 2020  #2 history of Barrett's esophagus no dysplasia-will be due for follow-up EGD spring 2020  #3 diverticulosis #4 peripheral vascular disease status post iliac stent-on Plavix #5 history of nephrolithiasis #6 carotid stenosis 7 hypertension  Plan;; We will send long-term prescription for Apriso  0.375 mg, 4 tablets daily 90-day supply/refills through [facility] He will continue to taper off of prednisone over the next week and a half  He is asked to call should he have any flare of symptoms after he finishes prednisone, at that time may need to restart him at a lower dose of prednisone and a slower taper.  If he continues to do well we will plan to see him back in March or April, and plan to schedule for EGD and colonoscopy at that time.    [name] [name] Genia Harold PA-C 06/29/2018   Cc: [name] Borg, MD

## 2018-06-29 NOTE — Progress Notes (Signed)
Reviewed and agree with management plan.  Amitai Delaughter T. Shonte Soderlund, MD FACG 

## 2018-06-29 NOTE — Patient Instructions (Signed)
We sent 90 day supply of Aprise 0.375 g to [facility]. Samples of the Apriso provided.   Call back and ask for Dr.Stark's nurse if symptoms persist.  Plan to follow up for March / April 2020.   Normal BMI (Body Mass Index- based on height and weight) is between 23 and 30. Your BMI today is Body mass index is 34.36 kg/m. [name] Please consider follow up  regarding your BMI with your Primary Care Provider.

## 2018-07-06 ENCOUNTER — Other Ambulatory Visit: Payer: Self-pay

## 2018-07-09 NOTE — Pre-Procedure Instructions (Signed)
MUTASIM TUCKEY  07/09/2018      [place], [place] [facility] [place] [address] [place] [address] Phone: [phone] Fax: [place], [place] [address] Phone: [phone] Fax: [phone]    Your procedure is scheduled on July 19, 2018.  Report to [facility] Admitting at 530 AM.  Call this number if you have problems the morning of surgery:  [phone]   Remember:  Do not eat or drink after midnight.    Take these medicines the morning of surgery with A SIP OF WATER  Allopurinol (zyloprim) Pantoprazole (protonix)  Follow your surgeon's instructions on when to hold/resume Plavix.  If no instructions were given call the office to determine how they would like to you take Plavix  7 days prior to surgery STOP taking any Aspirin (unless otherwise instructed by your surgeon), Aleve, Naproxen, Ibuprofen, Motrin, Advil, Goody's, BC's, all herbal medications, fish oil, and all vitamins    Do not wear jewelry  Do not wear lotions, powders, or colognes, or deodorant.   Men may shave face and neck.  Do not bring valuables to the hospital.  [facility] is not responsible for any belongings or valuables.  Contacts, dentures or bridgework may not be worn into surgery.  Leave your suitcase in the car.  After surgery it may be brought to your room.  For patients admitted to the hospital, discharge time will be determined by your treatment team.  Patients discharged the day of surgery will not be allowed to drive home.    - Preparing For Surgery  Before surgery, you can play an important role. Because skin is not sterile, your skin needs to be as free of germs as possible. You can reduce the number of germs on your skin by washing with CHG (chlorahexidine gluconate) Soap before surgery.  CHG is an antiseptic cleaner  which kills germs and bonds with the skin to continue killing germs even after washing.    Oral Hygiene is also important to reduce your risk of infection.  Remember - BRUSH YOUR TEETH THE MORNING OF SURGERY WITH YOUR REGULAR TOOTHPASTE  Please do not use if you have an allergy to CHG or antibacterial soaps. If your skin becomes reddened/irritated stop using the CHG.  Do not shave (including legs and underarms) for at least 48 hours prior to first CHG shower. It is OK to shave your face.  Please follow these instructions carefully.   1. Shower the NIGHT BEFORE SURGERY and the MORNING OF SURGERY with CHG.   2. If you chose to wash your hair, wash your hair first as usual with your normal shampoo.  3. After you shampoo, rinse your hair and body thoroughly to remove the shampoo.  4. Use CHG as you would any other liquid soap. You can apply CHG directly to the skin and wash gently with a scrungie or a clean washcloth.   5. Apply the CHG Soap to your body ONLY FROM THE NECK DOWN.  Do not use on open wounds or open sores. Avoid contact with your eyes, ears, mouth and genitals (private parts). Wash Face and genitals (private parts)  with your normal soap.  6. Wash thoroughly, paying special attention to the area where your surgery will be performed.  7. Thoroughly rinse your body with warm water from the neck  down.  8. DO NOT shower/wash with your normal soap after using and rinsing off the CHG Soap.  9. Pat yourself dry with a CLEAN TOWEL.  10. Wear CLEAN PAJAMAS to bed the night before surgery, wear comfortable clothes the morning of surgery  11. Place CLEAN SHEETS on your bed the night of your first shower and DO NOT SLEEP WITH PETS.  Day of Surgery:  Do not apply any deodorants/lotions.  Please wear clean clothes to the hospital/surgery center.   Remember to brush your teeth WITH YOUR REGULAR TOOTHPASTE.   Please read over the following fact sheets that you were given. Pain  Booklet, Coughing and Deep Breathing, MRSA Information and Surgical Site Infection Prevention

## 2018-07-12 ENCOUNTER — Other Ambulatory Visit: Payer: Self-pay

## 2018-07-12 ENCOUNTER — Encounter (HOSPITAL_COMMUNITY)
Admission: RE | Admit: 2018-07-12 | Discharge: 2018-07-12 | Disposition: A | Discharge status: Home / Self Care | Payer: Medicare Other | Source: Ambulatory Visit | Attending: Vascular Surgery | Admitting: Vascular Surgery

## 2018-07-12 ENCOUNTER — Encounter (HOSPITAL_COMMUNITY): Payer: Self-pay

## 2018-07-12 DIAGNOSIS — Z01812 Encounter for preprocedural laboratory examination: Secondary | ICD-10-CM | POA: Diagnosis not present

## 2018-07-12 LAB — COMPREHENSIVE METABOLIC PANEL
ALK PHOS: 64 U/L (ref 38–126)
ALT: 17 U/L (ref 0–44)
AST: 16 U/L (ref 15–41)
Albumin: 3.9 g/dL (ref 3.5–5.0)
Anion gap: 6 (ref 5–15)
BUN: 15 mg/dL (ref 8–23)
CO2: 29 mmol/L (ref 22–32)
CREATININE: 1.1 mg/dL (ref 0.61–1.24)
Calcium: 9.1 mg/dL (ref 8.9–10.3)
Chloride: 102 mmol/L (ref 98–111)
GFR calc non Af Amer: >60 mL/min (ref >60)
Glucose, Bld: 99 mg/dL (ref 70–99)
Potassium: 3.6 mmol/L (ref 3.5–5.1)
SODIUM: 137 mmol/L (ref 135–145)
Total Bilirubin: 0.8 mg/dL (ref 0.3–1.2)
Total Protein: 6.7 g/dL (ref 6.5–8.1)

## 2018-07-12 LAB — TYPE AND SCREEN
ABO/RH(D): A POS
ANTIBODY SCREEN: NEGATIVE

## 2018-07-12 LAB — SURGICAL PCR SCREEN
MRSA, PCR: NEGATIVE
Staphylococcus aureus: NEGATIVE

## 2018-07-12 LAB — CBC
HCT: 46.8 % (ref 39.0–52.0)
HEMOGLOBIN: 14.9 g/dL (ref 13.0–17.0)
MCH: 28.7 pg (ref 26.0–34.0)
MCHC: 31.8 g/dL (ref 30.0–36.0)
MCV: 90 fL (ref 80.0–100.0)
Platelets: 198 10*3/uL (ref 150–400)
RBC: 5.2 MIL/uL (ref 4.22–5.81)
RDW: 13.9 % (ref 11.5–15.5)
WBC: 10.4 10*3/uL (ref 4.0–10.5)
nRBC: 0 % (ref 0.0–0.2)

## 2018-07-12 LAB — URINALYSIS, ROUTINE W REFLEX MICROSCOPIC
BILIRUBIN URINE: NEGATIVE
GLUCOSE, UA: NEGATIVE mg/dL
HGB URINE DIPSTICK: NEGATIVE
KETONES UR: NEGATIVE mg/dL
LEUKOCYTES UA: NEGATIVE
Nitrite: NEGATIVE
PH: 5 (ref 5.0–8.0)
PROTEIN: NEGATIVE mg/dL
Specific Gravity, Urine: 1.021 (ref 1.005–1.030)

## 2018-07-12 LAB — APTT: aPTT: 31 seconds (ref 24–36)

## 2018-07-12 LAB — PROTIME-INR
INR: 1.06
PROTHROMBIN TIME: 13.7 s (ref 11.4–15.2)

## 2018-07-12 LAB — ABO/RH: ABO/RH(D): A POS

## 2018-07-12 NOTE — Progress Notes (Signed)
PCP - Dr. [name]  Cardiologist - Denies  Chest x-ray - Denies  EKG - 12/19/17 (E)  Stress Test - 09/30/16 (E)  ECHO - Denies  Cardiac Cath - Denies  AICD- na PM- na LOOP- na  Sleep Study - Denies CPAP - None  LABS- 07/12/18: CBC, CMP, PT, PTT, T/S, PCR, UA  ASA- Denies Plavix- Continue  Anesthesia- No  Pt denies having chest pain, sob, or fever at this time. All instructions explained to the pt, with a verbal understanding of the material. Pt agrees to go over the instructions while at home for a better understanding. The opportunity to ask questions was provided.

## 2018-07-18 NOTE — Anesthesia Preprocedure Evaluation (Addendum)
Anesthesia Evaluation  Patient identified by MRN, date of birth, ID band Patient awake    Reviewed: Allergy & Precautions, NPO status , Patient's Chart, lab work & pertinent test results  History of Anesthesia Complications (+) PONV  Airway Mallampati: II  TM Distance: >3 FB Neck ROM: Full    Dental  (+) Dental Advisory Given, Edentulous Upper, Edentulous Lower   Pulmonary neg pulmonary ROS, former smoker,    Pulmonary exam normal breath sounds clear to auscultation       Cardiovascular hypertension, Pt. on medications + Peripheral Vascular Disease  Normal cardiovascular exam Rhythm:Regular Rate:Normal     Neuro/Psych  Headaches, PSYCHIATRIC DISORDERS Depression    GI/Hepatic Neg liver ROS, PUD, GERD  ,  Endo/Other  negative endocrine ROS  Renal/GU negative Renal ROS     Musculoskeletal  (+) Arthritis ,   Abdominal   Peds  Hematology negative hematology ROS (+)   Anesthesia Other Findings   Reproductive/Obstetrics                            Anesthesia Physical  Anesthesia Plan  ASA: III  Anesthesia Plan: General   Post-op Pain Management:    Induction: Intravenous  PONV Risk Score and Plan: 3 and Ondansetron and Dexamethasone  Airway Management Planned: Oral ETT  Additional Equipment:   Intra-op Plan:   Post-operative Plan: Possible Post-op intubation/ventilation  Informed Consent: I have reviewed the patients History and Physical, chart, labs and discussed the procedure including the risks, benefits and alternatives for the proposed anesthesia with the patient or authorized representative who has indicated his/her understanding and acceptance.   Dental advisory given  Plan Discussed with: CRNA  Anesthesia Plan Comments: (Remi gtt)       Anesthesia Quick Evaluation

## 2018-07-19 ENCOUNTER — Inpatient Hospital Stay (HOSPITAL_COMMUNITY): Payer: Medicare Other | Admitting: Anesthesiology

## 2018-07-19 ENCOUNTER — Other Ambulatory Visit: Payer: Self-pay

## 2018-07-19 ENCOUNTER — Inpatient Hospital Stay (HOSPITAL_COMMUNITY)
Admission: RE | Admit: 2018-07-19 | Discharge: 2018-07-20 | DRG: 254 | Disposition: A | Discharge status: Home / Self Care | Payer: Medicare Other | Attending: Vascular Surgery | Admitting: Vascular Surgery

## 2018-07-19 ENCOUNTER — Encounter (HOSPITAL_COMMUNITY): Payer: Self-pay

## 2018-07-19 ENCOUNTER — Encounter (HOSPITAL_COMMUNITY)
Admission: RE | Disposition: A | Discharge status: Home / Self Care | Payer: Self-pay | Source: Home / Self Care | Attending: Vascular Surgery

## 2018-07-19 DIAGNOSIS — E785 Hyperlipidemia, unspecified: Secondary | ICD-10-CM | POA: Diagnosis not present

## 2018-07-19 DIAGNOSIS — Z87891 Personal history of nicotine dependence: Secondary | ICD-10-CM

## 2018-07-19 DIAGNOSIS — Z85828 Personal history of other malignant neoplasm of skin: Secondary | ICD-10-CM

## 2018-07-19 DIAGNOSIS — I10 Essential (primary) hypertension: Secondary | ICD-10-CM | POA: Diagnosis not present

## 2018-07-19 DIAGNOSIS — K219 Gastro-esophageal reflux disease without esophagitis: Secondary | ICD-10-CM | POA: Diagnosis not present

## 2018-07-19 DIAGNOSIS — I70211 Atherosclerosis of native arteries of extremities with intermittent claudication, right leg: Secondary | ICD-10-CM | POA: Diagnosis not present

## 2018-07-19 DIAGNOSIS — Z7902 Long term (current) use of antithrombotics/antiplatelets: Secondary | ICD-10-CM | POA: Diagnosis not present

## 2018-07-19 DIAGNOSIS — Z7952 Long term (current) use of systemic steroids: Secondary | ICD-10-CM | POA: Diagnosis not present

## 2018-07-19 DIAGNOSIS — Z79899 Other long term (current) drug therapy: Secondary | ICD-10-CM

## 2018-07-19 DIAGNOSIS — I739 Peripheral vascular disease, unspecified: Secondary | ICD-10-CM | POA: Diagnosis not present

## 2018-07-19 HISTORY — PX: ENDARTERECTOMY FEMORAL: SHX5804

## 2018-07-19 HISTORY — PX: PATCH ANGIOPLASTY: SHX6230

## 2018-07-19 LAB — CBC
HCT: 39.3 % (ref 39.0–52.0)
Hemoglobin: 12.9 g/dL — ABNORMAL LOW (ref 13.0–17.0)
MCH: 29.1 pg (ref 26.0–34.0)
MCHC: 32.8 g/dL (ref 30.0–36.0)
MCV: 88.5 fL (ref 80.0–100.0)
NRBC: 0 % (ref 0.0–0.2)
PLATELETS: 203 10*3/uL (ref 150–400)
RBC: 4.44 MIL/uL (ref 4.22–5.81)
RDW: 13.7 % (ref 11.5–15.5)
WBC: 10.3 10*3/uL (ref 4.0–10.5)

## 2018-07-19 LAB — CREATININE, SERUM
Creatinine, Ser: 1.03 mg/dL (ref 0.61–1.24)
GFR calc non Af Amer: >60 mL/min (ref >60)

## 2018-07-19 LAB — POCT ACTIVATED CLOTTING TIME: ACTIVATED CLOTTING TIME: 263 s

## 2018-07-19 SURGERY — ENDARTERECTOMY, FEMORAL
Anesthesia: General | Laterality: Right

## 2018-07-19 MED ORDER — CEFAZOLIN SODIUM-DEXTROSE 2-4 GM/100ML-% IV SOLN
2.0000 g | INTRAVENOUS | Status: AC
  Administered 2018-07-19: 2 g via INTRAVENOUS

## 2018-07-19 MED ORDER — HEPARIN SODIUM (PORCINE) 5000 UNIT/ML IJ SOLN
5000.0000 [IU] | Freq: Three times a day (TID) | INTRAMUSCULAR | Status: DC
  Administered 2018-07-20: 5000 [IU] via SUBCUTANEOUS
  Filled 2018-07-19: qty 1

## 2018-07-19 MED ORDER — MAGNESIUM SULFATE 2 GM/50ML IV SOLN: 2.0000 g | Freq: Every day | INTRAVENOUS | Status: DC | PRN

## 2018-07-19 MED ORDER — ONDANSETRON HCL 4 MG/2ML IJ SOLN: 4.0000 mg | Freq: Four times a day (QID) | INTRAMUSCULAR | Status: DC | PRN

## 2018-07-19 MED ORDER — SODIUM CHLORIDE 0.9 % IV SOLN
INTRAVENOUS | Status: AC
  Filled 2018-07-19: qty 1.2

## 2018-07-19 MED ORDER — PHENYLEPHRINE 40 MCG/ML (10ML) SYRINGE FOR IV PUSH (FOR BLOOD PRESSURE SUPPORT)
PREFILLED_SYRINGE | INTRAVENOUS | Status: DC | PRN
  Administered 2018-07-19 (×2): 80 ug via INTRAVENOUS

## 2018-07-19 MED ORDER — ALLOPURINOL 100 MG PO TABS
200.0000 mg | ORAL_TABLET | Freq: Every day | ORAL | Status: DC
  Administered 2018-07-20: 200 mg via ORAL
  Filled 2018-07-19: qty 2

## 2018-07-19 MED ORDER — GUAIFENESIN-DM 100-10 MG/5ML PO SYRP: 15.0000 mL | ORAL_SOLUTION | ORAL | Status: DC | PRN

## 2018-07-19 MED ORDER — FENTANYL CITRATE (PF) 100 MCG/2ML IJ SOLN
25.0000 ug | INTRAMUSCULAR | Status: DC | PRN
  Administered 2018-07-19 (×2): 50 ug via INTRAVENOUS

## 2018-07-19 MED ORDER — SODIUM CHLORIDE 0.9 % IV SOLN: INTRAVENOUS | Status: DC

## 2018-07-19 MED ORDER — EPHEDRINE SULFATE-NACL 50-0.9 MG/10ML-% IV SOSY
PREFILLED_SYRINGE | INTRAVENOUS | Status: DC | PRN
  Administered 2018-07-19: 5 mg via INTRAVENOUS

## 2018-07-19 MED ORDER — CHLORHEXIDINE GLUCONATE CLOTH 2 % EX PADS: 6.0000 | MEDICATED_PAD | Freq: Once | CUTANEOUS | Status: DC

## 2018-07-19 MED ORDER — SUGAMMADEX SODIUM 200 MG/2ML IV SOLN
INTRAVENOUS | Status: AC
  Filled 2018-07-19: qty 2

## 2018-07-19 MED ORDER — CEFAZOLIN SODIUM-DEXTROSE 2-4 GM/100ML-% IV SOLN
2.0000 g | Freq: Three times a day (TID) | INTRAVENOUS | Status: AC
  Administered 2018-07-19 (×2): 2 g via INTRAVENOUS
  Filled 2018-07-19 (×2): qty 100

## 2018-07-19 MED ORDER — 0.9 % SODIUM CHLORIDE (POUR BTL) OPTIME
TOPICAL | Status: DC | PRN
  Administered 2018-07-19: 2000 mL

## 2018-07-19 MED ORDER — LABETALOL HCL 5 MG/ML IV SOLN: 10.0000 mg | INTRAVENOUS | Status: DC | PRN

## 2018-07-19 MED ORDER — DOCUSATE SODIUM 100 MG PO CAPS
100.0000 mg | ORAL_CAPSULE | Freq: Every day | ORAL | Status: DC
  Administered 2018-07-20: 100 mg via ORAL
  Filled 2018-07-19: qty 1

## 2018-07-19 MED ORDER — HYDRALAZINE HCL 20 MG/ML IJ SOLN: 5.0000 mg | INTRAMUSCULAR | Status: DC | PRN

## 2018-07-19 MED ORDER — HYDROCHLOROTHIAZIDE 25 MG PO TABS
25.0000 mg | ORAL_TABLET | Freq: Every day | ORAL | Status: DC
  Administered 2018-07-19 – 2018-07-20 (×2): 25 mg via ORAL
  Filled 2018-07-19 (×2): qty 1

## 2018-07-19 MED ORDER — LISINOPRIL 10 MG PO TABS
20.0000 mg | ORAL_TABLET | Freq: Every day | ORAL | Status: DC
  Administered 2018-07-19 – 2018-07-20 (×2): 20 mg via ORAL
  Filled 2018-07-19 (×2): qty 2

## 2018-07-19 MED ORDER — HEPARIN SODIUM (PORCINE) 1000 UNIT/ML IJ SOLN
INTRAMUSCULAR | Status: AC
  Filled 2018-07-19: qty 2

## 2018-07-19 MED ORDER — ZOLPIDEM TARTRATE 5 MG PO TABS: 10.0000 mg | ORAL_TABLET | Freq: Every evening | ORAL | Status: DC | PRN

## 2018-07-19 MED ORDER — SODIUM CHLORIDE 0.9 % IV SOLN
INTRAVENOUS | Status: DC | PRN
  Administered 2018-07-19: 500 mL

## 2018-07-19 MED ORDER — ROCURONIUM BROMIDE 50 MG/5ML IV SOSY
PREFILLED_SYRINGE | INTRAVENOUS | Status: AC
  Filled 2018-07-19: qty 5

## 2018-07-19 MED ORDER — MEPERIDINE HCL 50 MG/ML IJ SOLN: 6.2500 mg | INTRAMUSCULAR | Status: DC | PRN

## 2018-07-19 MED ORDER — MESALAMINE ER 0.375 G PO CP24: 1.5000 g | ORAL_CAPSULE | Freq: Every day | ORAL | Status: DC

## 2018-07-19 MED ORDER — METOPROLOL TARTRATE 5 MG/5ML IV SOLN: 2.0000 mg | INTRAVENOUS | Status: DC | PRN

## 2018-07-19 MED ORDER — ONDANSETRON HCL 4 MG/2ML IJ SOLN
INTRAMUSCULAR | Status: DC | PRN
  Administered 2018-07-19: 4 mg via INTRAVENOUS

## 2018-07-19 MED ORDER — ACETAMINOPHEN 325 MG RE SUPP: 325.0000 mg | RECTAL | Status: DC | PRN

## 2018-07-19 MED ORDER — PROTAMINE SULFATE 10 MG/ML IV SOLN
INTRAVENOUS | Status: AC
  Filled 2018-07-19: qty 5

## 2018-07-19 MED ORDER — LISINOPRIL-HYDROCHLOROTHIAZIDE 20-25 MG PO TABS: 1.0000 | ORAL_TABLET | Freq: Every day | ORAL | Status: DC

## 2018-07-19 MED ORDER — FENTANYL CITRATE (PF) 250 MCG/5ML IJ SOLN
INTRAMUSCULAR | Status: AC
  Filled 2018-07-19: qty 5

## 2018-07-19 MED ORDER — HEPARIN SODIUM (PORCINE) 1000 UNIT/ML IJ SOLN
INTRAMUSCULAR | Status: DC | PRN
  Administered 2018-07-19: 10000 [IU] via INTRAVENOUS
  Administered 2018-07-19: 2000 [IU] via INTRAVENOUS

## 2018-07-19 MED ORDER — CLOPIDOGREL BISULFATE 75 MG PO TABS
75.0000 mg | ORAL_TABLET | Freq: Every day | ORAL | Status: DC
  Administered 2018-07-20: 75 mg via ORAL
  Filled 2018-07-19: qty 1

## 2018-07-19 MED ORDER — HEMOSTATIC AGENTS (NO CHARGE) OPTIME
TOPICAL | Status: DC | PRN
  Administered 2018-07-19 (×2): 1 via TOPICAL

## 2018-07-19 MED ORDER — ZOLPIDEM TARTRATE 5 MG PO TABS: 5.0000 mg | ORAL_TABLET | Freq: Every evening | ORAL | Status: DC | PRN

## 2018-07-19 MED ORDER — PROTAMINE SULFATE 10 MG/ML IV SOLN
INTRAVENOUS | Status: DC | PRN
  Administered 2018-07-19: 50 mg via INTRAVENOUS

## 2018-07-19 MED ORDER — OXYCODONE-ACETAMINOPHEN 5-325 MG PO TABS
ORAL_TABLET | ORAL | Status: AC
  Filled 2018-07-19: qty 2

## 2018-07-19 MED ORDER — LACTATED RINGERS IV SOLN
INTRAVENOUS | Status: DC | PRN
  Administered 2018-07-19 (×2): via INTRAVENOUS

## 2018-07-19 MED ORDER — MORPHINE SULFATE (PF) 2 MG/ML IV SOLN: 2.0000 mg | INTRAVENOUS | Status: DC | PRN

## 2018-07-19 MED ORDER — FENTANYL CITRATE (PF) 100 MCG/2ML IJ SOLN
INTRAMUSCULAR | Status: AC
  Administered 2018-07-19: 50 ug via INTRAVENOUS
  Filled 2018-07-19: qty 2

## 2018-07-19 MED ORDER — FENTANYL CITRATE (PF) 100 MCG/2ML IJ SOLN
INTRAMUSCULAR | Status: DC | PRN
  Administered 2018-07-19 (×2): 50 ug via INTRAVENOUS
  Administered 2018-07-19: 100 ug via INTRAVENOUS

## 2018-07-19 MED ORDER — PROMETHAZINE HCL 25 MG/ML IJ SOLN: 6.2500 mg | INTRAMUSCULAR | Status: DC | PRN

## 2018-07-19 MED ORDER — PROPOFOL 10 MG/ML IV BOLUS
INTRAVENOUS | Status: AC
  Filled 2018-07-19: qty 20

## 2018-07-19 MED ORDER — ROCURONIUM BROMIDE 50 MG/5ML IV SOSY
PREFILLED_SYRINGE | INTRAVENOUS | Status: DC | PRN
  Administered 2018-07-19: 50 mg via INTRAVENOUS

## 2018-07-19 MED ORDER — DEXAMETHASONE SODIUM PHOSPHATE 10 MG/ML IJ SOLN
INTRAMUSCULAR | Status: DC | PRN
  Administered 2018-07-19: 10 mg via INTRAVENOUS

## 2018-07-19 MED ORDER — MESALAMINE ER 250 MG PO CPCR
1500.0000 mg | ORAL_CAPSULE | Freq: Every day | ORAL | Status: DC
  Administered 2018-07-19: 1500 mg via ORAL
  Filled 2018-07-19 (×2): qty 6

## 2018-07-19 MED ORDER — ACETAMINOPHEN 325 MG PO TABS: 325.0000 mg | ORAL_TABLET | ORAL | Status: DC | PRN

## 2018-07-19 MED ORDER — ALUM & MAG HYDROXIDE-SIMETH 200-200-20 MG/5ML PO SUSP: 15.0000 mL | ORAL | Status: DC | PRN

## 2018-07-19 MED ORDER — LIDOCAINE 2% (20 MG/ML) 5 ML SYRINGE
INTRAMUSCULAR | Status: DC | PRN
  Administered 2018-07-19: 100 mg via INTRAVENOUS

## 2018-07-19 MED ORDER — PHENOL 1.4 % MT LIQD: 1.0000 | OROMUCOSAL | Status: DC | PRN

## 2018-07-19 MED ORDER — CEFAZOLIN SODIUM-DEXTROSE 2-4 GM/100ML-% IV SOLN
INTRAVENOUS | Status: AC
  Filled 2018-07-19: qty 100

## 2018-07-19 MED ORDER — ONDANSETRON HCL 4 MG/2ML IJ SOLN
INTRAMUSCULAR | Status: AC
  Filled 2018-07-19: qty 2

## 2018-07-19 MED ORDER — SODIUM CHLORIDE 0.9 % IV SOLN: 500.0000 mL | Freq: Once | INTRAVENOUS | Status: DC | PRN

## 2018-07-19 MED ORDER — PROPOFOL 10 MG/ML IV BOLUS
INTRAVENOUS | Status: DC | PRN
  Administered 2018-07-19: 50 mg via INTRAVENOUS
  Administered 2018-07-19: 150 mg via INTRAVENOUS

## 2018-07-19 MED ORDER — LIDOCAINE 2% (20 MG/ML) 5 ML SYRINGE
INTRAMUSCULAR | Status: AC
  Filled 2018-07-19: qty 5

## 2018-07-19 MED ORDER — POTASSIUM CHLORIDE CRYS ER 20 MEQ PO TBCR: 20.0000 meq | EXTENDED_RELEASE_TABLET | Freq: Every day | ORAL | Status: DC | PRN

## 2018-07-19 MED ORDER — PANTOPRAZOLE SODIUM 40 MG PO TBEC
40.0000 mg | DELAYED_RELEASE_TABLET | Freq: Every day | ORAL | Status: DC
  Administered 2018-07-20: 40 mg via ORAL
  Filled 2018-07-19: qty 1

## 2018-07-19 MED ORDER — OXYCODONE-ACETAMINOPHEN 5-325 MG PO TABS
1.0000 | ORAL_TABLET | ORAL | Status: DC | PRN
  Administered 2018-07-19 – 2018-07-20 (×3): 2 via ORAL
  Filled 2018-07-19: qty 2
  Filled 2018-07-19: qty 1
  Filled 2018-07-19 (×2): qty 2

## 2018-07-19 SURGICAL SUPPLY — 46 items
CANISTER SUCT 3000ML PPV (MISCELLANEOUS) ×3 IMPLANT
CLIP VESOCCLUDE MED 24/CT (CLIP) ×3 IMPLANT
CLIP VESOCCLUDE SM WIDE 24/CT (CLIP) ×3 IMPLANT
COVER WAND RF STERILE (DRAPES) ×3 IMPLANT
DERMABOND ADVANCED (GAUZE/BANDAGES/DRESSINGS) ×2
DERMABOND ADVANCED .7 DNX12 (GAUZE/BANDAGES/DRESSINGS) ×1 IMPLANT
DRAIN CHANNEL 15F RND FF W/TCR (WOUND CARE) IMPLANT
ELECT REM PT RETURN 9FT ADLT (ELECTROSURGICAL) ×3
ELECTRODE REM PT RTRN 9FT ADLT (ELECTROSURGICAL) ×1 IMPLANT
EVACUATOR SILICONE 100CC (DRAIN) IMPLANT
FELT TEFLON 1X6 (MISCELLANEOUS) ×3 IMPLANT
GLOVE BIO SURGEON STRL SZ7.5 (GLOVE) ×6 IMPLANT
GLOVE BIOGEL M STRL SZ7.5 (GLOVE) ×6 IMPLANT
GLOVE BIOGEL PI IND STRL 7.0 (GLOVE) ×2 IMPLANT
GLOVE BIOGEL PI IND STRL 7.5 (GLOVE) ×2 IMPLANT
GLOVE BIOGEL PI IND STRL 8 (GLOVE) ×1 IMPLANT
GLOVE BIOGEL PI INDICATOR 7.0 (GLOVE) ×4
GLOVE BIOGEL PI INDICATOR 7.5 (GLOVE) ×4
GLOVE BIOGEL PI INDICATOR 8 (GLOVE) ×2
GLOVE SS BIOGEL STRL SZ 7 (GLOVE) ×2 IMPLANT
GLOVE SUPERSENSE BIOGEL SZ 7 (GLOVE) ×4
GOWN STRL REUS W/ TWL LRG LVL3 (GOWN DISPOSABLE) ×3 IMPLANT
GOWN STRL REUS W/ TWL XL LVL3 (GOWN DISPOSABLE) ×2 IMPLANT
GOWN STRL REUS W/TWL LRG LVL3 (GOWN DISPOSABLE) ×6
GOWN STRL REUS W/TWL XL LVL3 (GOWN DISPOSABLE) ×4
GRAFT VASC PATCH XENOSURE 1X14 (Vascular Products) ×3 IMPLANT
HEMOSTAT SNOW SURGICEL 2X4 (HEMOSTASIS) ×6 IMPLANT
HEMOSTAT SPONGE AVITENE ULTRA (HEMOSTASIS) IMPLANT
KIT BASIN OR (CUSTOM PROCEDURE TRAY) ×3 IMPLANT
KIT TURNOVER KIT B (KITS) ×3 IMPLANT
LOOP VESSEL MINI RED (MISCELLANEOUS) ×3 IMPLANT
NS IRRIG 1000ML POUR BTL (IV SOLUTION) ×6 IMPLANT
PACK PERIPHERAL VASCULAR (CUSTOM PROCEDURE TRAY) ×3 IMPLANT
PAD ARMBOARD 7.5X6 YLW CONV (MISCELLANEOUS) ×6 IMPLANT
SUT MNCRL AB 4-0 PS2 18 (SUTURE) ×6 IMPLANT
SUT PROLENE 5 0 C 1 24 (SUTURE) ×6 IMPLANT
SUT PROLENE 6 0 BV (SUTURE) ×21 IMPLANT
SUT PROLENE 7 0 BV1 MDA (SUTURE) ×3 IMPLANT
SUT VIC AB 2-0 CT1 27 (SUTURE) ×6
SUT VIC AB 2-0 CT1 TAPERPNT 27 (SUTURE) ×3 IMPLANT
SUT VIC AB 3-0 SH 27 (SUTURE) ×4
SUT VIC AB 3-0 SH 27X BRD (SUTURE) ×2 IMPLANT
TOWEL GREEN STERILE (TOWEL DISPOSABLE) ×3 IMPLANT
TRAY FOLEY MTR SLVR 16FR STAT (SET/KITS/TRAYS/PACK) ×3 IMPLANT
UNDERPAD 30X30 (UNDERPADS AND DIAPERS) IMPLANT
WATER STERILE IRR 1000ML POUR (IV SOLUTION) ×3 IMPLANT

## 2018-07-19 NOTE — H&P (Signed)
History and Physical Interval Note:  07/19/2018 7:03 AM  [name] [name]  has presented today for surgery, with the diagnosis of claudication  The various methods of treatment have been discussed with the patient and family. After consideration of risks, benefits and other options for treatment, the patient has consented to  Procedure(s): ENDARTERECTOMY FEMORAL (Right) as a surgical intervention .  The patient's history has been reviewed, patient examined, no change in status, stable for surgery.  I have reviewed the patient's chart and labs.  Questions were answered to the patient's satisfaction.     Right common femoral endarterectomy.  [name] [name]  Patient name: [name] [name]         MRN: [id number]        DOB: 1941/06/06        Sex: male  REASON FOR VISIT: Follow-up status post left external iliac angioplasty/stent  HPI: [name] [name] is a 77 y.o. male with history of hypertension and hyperlipidemia who presents for interval follow-up after left external iliac angioplasty and stent placement.  Patient previous had lifestyle limiting claudication in both legs and states he could not walk more than 100 feet given bilateral burning calf pain.  Since intervention on the left he states he can now walk to his mailbox and back.  He's had significant improvement in his left leg symptoms.  Unfortunately his right leg still remains fairly symptomatic and still has lifestyle limiting claudication in his right leg.  He denies any rest pain or tissue loss in the right foot.  He is on Plavix.  Regarding his cardiac risk he did have a Lexiscan on 09/30/2016 that was normal with no ischemia and an EF of 57%.  No new CP or SOB.       Past Medical History:  Diagnosis Date  . Abdominal pain, unspecified site 10/16/2009  . Anal fissure 04/24/2008  . Arthritis   . B12 deficiency 10/11/2014  . BACK PAIN 01/10/2009  . Barrett's esophagus 1997  . BARRETTS ESOPHAGUS 08/16/2007  . BREAST HYPERTROPHY  02/16/2009  . CARBUNCLE, NECK 02/14/2008  . Cataract   . DIVERTICULOSIS, COLON 06/28/2007  . Full dentures   . GERD 06/28/2007  . GLUCOSE INTOLERANCE 08/15/2008  . Headache(784.0) 11/13/2009  . HERNIA, UMBILICAL W/OBSTRUCTION W/O GANGRENE 06/28/2007  . HTN (hypertension) 07/12/2012  . HYPERLIPIDEMIA 06/28/2007  . HYPERTENSION 06/28/2007  . Impaired glucose tolerance 04/13/2011  . INSOMNIA-SLEEP DISORDER-UNSPEC 10/10/2008  . LEG PAIN, BILATERAL 02/16/2009  . LOW BACK PAIN 06/28/2007  . MASTITIS 01/10/2009  . NECK PAIN 05/31/2008  . NEPHROLITHIASIS, HX OF 06/28/2007  . PROCTOSIGMOIDITIS, ULCERATIVE 1997  . PVD (peripheral vascular disease) with claudication, lifestyle limiting, ABI rt. 0.67, lt. 0.73 07/12/2012  . S/P angioplasty with stent, after DB athrectomy to Rt. ext. iliac and Rt. SFA 07/12/2012  . Skin cancer    right hand  . Spinal stenosis at L4-L5 level   . Stenosis of left carotid artery, mod to severe by dopplers 09/29/2012  . TESTICULAR PAIN, RIGHT 10/25/2010  . VITAMIN D DEFICIENCY 11/13/2009         Past Surgical History:  Procedure Laterality Date  . ABDOMINAL AORTOGRAM N/A 05/13/2018   Procedure: ABDOMINAL AORTOGRAM;  Surgeon: [name] Heck, MD;  Location: [facility];  Service: Cardiovascular;  Laterality: N/A;  . anal fissure surgury     pt unsure of this  . ATHERECTOMY  09/28/2012  . ATHERECTOMY N/A 07/12/2012   Procedure: ATHERECTOMY;  Surgeon: Pearletha Forge  Gwenlyn Found, MD;  Location: [facility];  Service: Cardiovascular;  Laterality: N/A;  . ATHERECTOMY N/A 09/28/2012   Procedure: ATHERECTOMY;  Surgeon: [name], MD;  Location: [facility] CATH LAB;  Service: Cardiovascular;  Laterality: N/A;  . bilateral inguinal hernia with mesh and umbilical hernia repairs  09/2007  . CATARACT EXTRACTION Bilateral    bilateral  . COLONOSCOPY    . GROIN DISSECTION Right 07/20/2013   Procedure: GROIN EXPLORATION, EXCISIONAL BIOPSY RIGHT INGUINAL LYMPH NODES;   Surgeon: Harl Bowie, MD;  Location: [facility];  Service: General;  Laterality: Right;  . HERNIA REPAIR    . left knee arthroscopy    . LOWER EXTREMITY ANGIOGRAPHY Bilateral 05/13/2018   Procedure: Lower Extremity Angiography;  Surgeon: [name] Heck, MD;  Location: [facility];  Service: Cardiovascular;  Laterality: Bilateral;  . LUMBAR LAMINECTOMY/DECOMPRESSION MICRODISCECTOMY N/A 11/05/2016   Procedure: Microlumbar decompression L2-3, L3-4, L4-5, lateral mass fusion L4-5;  Surgeon: Susa Day, MD;  Location: [facility];  Service: Orthopedics;  Laterality: N/A;  . PERCUTANEOUS STENT INTERVENTION  09/28/2012   Procedure: PERCUTANEOUS STENT INTERVENTION;  Surgeon: [name], MD;  Location: [facility] C[facility] LAB;  Service: Cardiovascular;;  . PERIPHERAL VASCULAR INTERVENTION  05/13/2018   Procedure: PERIPHERAL VASCULAR INTERVENTION;  Surgeon: [name] Heck, MD;  Location: [facility];  Service: Cardiovascular;;  Ext. Iliac  . UPPER GI ENDOSCOPY           Family History  Problem Relation Age of Onset  . COPD Mother   . Cerebral aneurysm Mother   . COPD Father   . Colon cancer Neg Hx   . Esophageal cancer Neg Hx   . Rectal cancer Neg Hx   . Stomach cancer Neg Hx     SOCIAL HISTORY: Social History        Tobacco Use  . Smoking status: Former Smoker    Last attempt to quit: 09/08/1972    Years since quitting: 45.7  . Smokeless tobacco: Former Systems developer    Types: Chew  Substance Use Topics  . Alcohol use: Not Currently    Alcohol/week: 5.0 standard drinks    Types: 5 Cans of beer per week         Allergies  Allergen Reactions  . Contrast Media [Iodinated Diagnostic Agents] Other (See Comments)    Break out in welts  . Statins Other (See Comments)    Legs pain: Atorvastatin and Lovastatin          Current Outpatient Medications  Medication Sig Dispense Refill  . allopurinol (ZYLOPRIM) 100 MG  tablet Take 2 tablets (200 mg total) by mouth daily. 180 tablet 6  . clopidogrel (PLAVIX) 75 MG tablet TAKE 1 TABLET BY MOUTH  DAILY (Patient taking differently: Take 75 mg by mouth daily. ) 90 tablet 2  . cyanocobalamin 1000 MCG tablet Take 1,000 mcg by mouth daily.     [name] lisinopril-hydrochlorothiazide (PRINZIDE,ZESTORETIC) 20-25 MG tablet TAKE 1 TABLET BY MOUTH  DAILY 90 tablet 2  . loperamide (IMODIUM) 2 MG capsule Take by mouth as needed for diarrhea or loose stools.    . mesalamine (APRISO) 0.375 g 24 hr capsule Take 4 capsules (1.5 g total) by mouth daily. 120 capsule 0  . pantoprazole (PROTONIX) 40 MG tablet Take 1 tablet (40 mg total) by mouth daily. 30 tablet 12  . predniSONE (DELTASONE) 10 MG tablet Take 5 tablets (50 mg) by mouth daily x 5 days, then reduce to 4  tablets (40 mg ) by mouth daily x 5 days, then reduce to 3 tablets (30 mg) by mouth daily x 5 days, then reduce to 2 tablets (20 mg ) by mouth daily x 5 days, then reduce to 1 tablet by mouth daily x 5 days, then reduce to 1 tablet by mouth every other day x 5 days, then stop. 80 tablet 0  . temazepam (RESTORIL) 30 MG capsule Take 1 capsule (30 mg total) by mouth at bedtime as needed for sleep. 30 capsule 2  . Vitamin D, Ergocalciferol, (DRISDOL) 50000 units CAPS capsule Take 1 capsule (50,000 Units total) by mouth every 7 (seven) days. 12 capsule 0   No current facility-administered medications for this visit.     REVIEW OF SYSTEMS:  [X]  denotes positive finding, [ ]  denotes negative finding Cardiac  Comments:  Chest pain or chest pressure:    Shortness of breath upon exertion:    Short of breath when lying flat:    Irregular heart rhythm:        Vascular    Pain in calf, thigh, or hip brought on by ambulation: x Right leg (left leg resolved)  Pain in feet at night that wakes you up from your sleep:     Blood clot in your veins:    Leg swelling:         Pulmonary    Oxygen at home:     Productive cough:     Wheezing:         Neurologic    Sudden weakness in arms or legs:     Sudden numbness in arms or legs:     Sudden onset of difficulty speaking or slurred speech:    Temporary loss of vision in one eye:     Problems with dizziness:         Gastrointestinal    Blood in stool:     Vomited blood:         Genitourinary    Burning when urinating:     Blood in urine:        Psychiatric    Major depression:         Hematologic    Bleeding problems:    Problems with blood clotting too easily:        Skin    Rashes or ulcers:        Constitutional    Fever or chills:      PHYSICAL EXAM:     Vitals:   06/15/18 0826 06/15/18 0828  BP: (!) 152/69 (!) 151/74  Pulse: 65   Resp: 18   Temp: (!) 97.2 F (36.2 C)   TempSrc: Oral   SpO2: 100%   Weight: 101.6 kg   Height: 5' 8"  (1.727 m)     GENERAL: The patient is a well-nourished male, in no acute distress. The vital signs are documented above. CARDIAC: There is a regular rate and rhythm.  VASCULAR:  2+ femoral pulse palpable left groin Weak right femoral pulse barely palpable Triphasic left PT signal Monophasic right PT signal PULMONARY: There is good air exchange bilaterally without wheezing or rales. ABDOMEN: Soft and non-tender with normal pitched bowel sounds.  MUSCULOSKELETAL: There are no major deformities or cyanosis. NEUROLOGIC: No focal weakness or paresthesias are detected. SKIN: There are no ulcers or rashes noted. PSYCHIATRIC: The patient has a normal affect.  DATA:   I had independently reviewed his noninvasive imaging.  His left ABIs have improved from 0.68 to  0.88 now with a triphasic signal from previous monophasic signal.  His right ABI is 0.49 with a monophasic signal.  Assessment/Plan:  [name] [name] appears to be doing much better with his left leg after left external iliac angioplasty and stent  placement.  Unfortunately he still has lifestyle limiting claudication in his right leg (we did not intervene on the right leg).  I reviewed his angiogram imaging and he needs a right common femoral endarterectomy.  Patient states he's seen such significant improvement in his left leg that he wants to proceed with right femoral endarterectomy to try to improve the symptoms in his right leg.  We will go ahead and plan for right femoral endarterectomy and he can continue his Plavix perioperative.  Discussed risks of bleeding, infection, wound breakdown, vessel injury, etc.  [name] Heck, MD [facility] Office: [phone] Pager: [place]

## 2018-07-19 NOTE — Op Note (Signed)
Date: July 19, 2018  Preoperative diagnosis: Right lower extremity claudication with severe common femoral artery disease  Postoperative diagnosis: Same  Procedure: Right common femoral thromboendarterectomy with bovine pericardial patch (patch carried onto SFA with SFA endarterectomy and eversion endarterectomy of profunda)  Surgeon: Dr. [name], MD  Assistant: Ruta Hinds, MD and Arlee Muslim, [place]  Indications: Patient is a 77 year old male who was previously seen in clinic for bilateral lower extremity claudication and had previously undergone SFA interventions on both sides.  Ultimately he was taken for aortogram and bilateral lower extremity arteriogram.  At that time we treated his left external iliac with angioplasty and stent placement and he states his left lower extremity claudication symptoms have improved.  He presents today for right common femoral endarterectomy with profundoplasty given findings on his right lower extremity arteriogram after risks and benefits were discussed.  Findings: Difficult dissection of the right common femoral artery given previous groin dissection.  Ultimately the common femoral artery was heavily diseased and calcified with greater than 90% stenosis with disease extending into the orifice of the profunda as well as into the proximal SFA.  Anesthesia: General  Details: The patient was taken the operating room after informed consent was obtained.  He was placed on operating table in supine position.  General endotracheal anesthesia was induced.  His right groin was then prepped and draped in the standard sterile fashion.  Preoperative timeout was performed to identify patient, procedure, and site.  Given that he had a very deep groin I used ultrasound guidance to identify his common femoral artery and this was marked out below the inguinal ligament.  Initially a 15 blade scalpel was used to make a longitudinal incision over the common  femoral artery and this dissection was extended down to the artery using a Bovie cautery and blunt dissection.  Lymphatic branches were ligated with 3-0 silk ties.  It should be noted the patient had a very deep groin with a rather small common femoral artery given his size.  This is a difficult dissection given the patient patient previous he had a right groin dissection.  Ultimately the external iliac artery was dissected out and controlled with Vesseloops as well as the epigastric branches controlled with small vessel loops.  The common femoral artery was also dissected out with blunt dissection and Bovie cautery including the profunda branch and the proximal SFA.  All these were individually controlled with Vesseloops.  At that point time the patient was given 10,000 units of IV heparin and ACTs were checked to maintain them greater than 250.  I used a profunda clamp on the profunda and SFA after our ACT was therapeutic and then a Satinsky clamp for control of the distal external iliac.  The artery was then opened in longitudinal fashion from the SFA to the distal external iliac and extended with Potts scissors.  There was a heavily calcified lesion down to the proximal SFA and lesion in the proximal profunda.  Ultimately using a penfield the common femoral artery was endarterectomized and we truncated the plaque in the distal external iliac where we got to soft artery and carried the endarterectomy down distally.  The profunda was endarterectomized with an eversion technique and the plaque appeared to feather appropriately.  We had to extend our dissection onto the SFA and then opened the arteriotomy more distally on the SFA where it was heavily diseased.  It was difficult to find a an appropriate endpoint and eventually we elected to  used tenotomy scissors to feather the plaque in the SFA.  This endarterectomy flap was then tacked tacked down in the SFA using 7-0 Prolene sutures in interrupted fashion.  We  brought a bovine pericardial patch on the field and it was trimmed accordingly and sewn in place from initially starting distal on the SFA where it was important that we could see the true lumen.  I did place a 2.5 mm dilator down the SFA true lumen to ensure that we were taking appropriate bites.  The bovine pericardial patch was sewn in running fashion using 6-0 prolene distal and 5-0 prolene proximal.  Upon completion we did de-air proximally and distally.  Once we had good pulsatile flow in the common femoral and checked the SFA and the profunda with doppler and had triphasic signals.  Went down and checked the foot and the patient had a more robust triphasic dorsalis pedis signal then posterior tibial but I thought a lot of this was related to spasm given his foot was warm.  I did have to place one pledget using 6-0 Prolene in the wall of the common femoral endarterectomy.  The groin was then irrigated with saline until effluent was clear.  Surgicel Emogene Morgan was applied for hemostasis.  I then closed the groin in three layers using 2-0 Vicryl and then a fourth layer using 3-0 Vicryl.  The skin was run closed with 4-0 Monocryl.  Dermabond was applied.  Taken to PACU in stable condition.  Condition: Stable  Complications: None  [name], MD [facility] Office: [phone] Pager: [place]

## 2018-07-19 NOTE — Anesthesia Procedure Notes (Addendum)
Procedure Name: Intubation Date/Time: 07/19/2018 7:40 AM Performed by: [name], MD Pre-anesthesia Checklist: Patient identified, Emergency Drugs available, Suction available and Patient being monitored Patient Re-evaluated:Patient Re-evaluated prior to induction Oxygen Delivery Method: Circle System Utilized Preoxygenation: Pre-oxygenation with 100% oxygen Induction Type: IV induction Ventilation: Oral airway inserted - appropriate to patient size and Mask ventilation without difficulty Laryngoscope Size: Miller and 2 Grade View: Grade I Tube type: Oral Tube size: 7.5 mm Number of attempts: 1 Airway Equipment and Method: Stylet and Oral airway Placement Confirmation: ETT inserted through vocal cords under direct vision,  positive ETCO2 and breath sounds checked- equal and bilateral Secured at: 23 cm Tube secured with: Tape Dental Injury: Teeth and Oropharynx as per pre-operative assessment  Comments: Performed by Oswaldo Milian, SRNA under direct supervision of Sampson Si, CRNA

## 2018-07-19 NOTE — Transfer of Care (Signed)
Immediate Anesthesia Transfer of Care Note  Patient: [name] [name]  Procedure(s) Performed: ENDARTERECTOMY RIGHT FEMORAL ARTERY (Right ) RIGHT FEMORAL PATCH ANGIOPLASTY WITH XENOSURE BIOLOGIC PATCH (Right )  Patient Location: PACU  Anesthesia Type:General  Level of Consciousness: awake, alert  and oriented  Airway & Oxygen Therapy: Patient Spontanous Breathing  Post-op Assessment: Report given to RN  Post vital signs: Reviewed and stable  Last Vitals:  Vitals Value Taken Time  BP 146/71 07/19/2018 11:44 AM  Temp    Pulse 89 07/19/2018 11:45 AM  Resp 16 07/19/2018 11:45 AM  SpO2 97 % 07/19/2018 11:45 AM  Vitals shown include unvalidated device data.  Last Pain:  Vitals:   07/19/18 1143  TempSrc:   PainSc: (P) 0-No pain         Complications: No apparent anesthesia complications

## 2018-07-19 NOTE — Anesthesia Postprocedure Evaluation (Signed)
Anesthesia Post Note  Patient: [name] [name]  Procedure(s) Performed: ENDARTERECTOMY RIGHT FEMORAL ARTERY (Right ) RIGHT FEMORAL PATCH ANGIOPLASTY WITH XENOSURE BIOLOGIC PATCH (Right )     Patient location during evaluation: PACU Anesthesia Type: General Level of consciousness: sedated and patient cooperative Pain management: pain level controlled Vital Signs Assessment: post-procedure vital signs reviewed and stable Respiratory status: spontaneous breathing Cardiovascular status: stable Anesthetic complications: no    Last Vitals:  Vitals:   07/19/18 1359 07/19/18 1400  BP: (!) 144/64 136/68  Pulse:  75  Resp:  12  Temp: 37.2 C   SpO2: 100% 100%    Last Pain:  Vitals:   07/19/18 1359  TempSrc: Oral  PainSc:                  [name]

## 2018-07-20 ENCOUNTER — Telehealth: Payer: Self-pay | Admitting: *Deleted

## 2018-07-20 ENCOUNTER — Encounter (HOSPITAL_COMMUNITY): Payer: Medicare Other

## 2018-07-20 ENCOUNTER — Encounter: Payer: Medicare Other | Admitting: Vascular Surgery

## 2018-07-20 ENCOUNTER — Encounter (HOSPITAL_COMMUNITY): Payer: Self-pay | Admitting: Vascular Surgery

## 2018-07-20 LAB — CBC
HCT: 34.1 % — ABNORMAL LOW (ref 39.0–52.0)
Hemoglobin: 11.3 g/dL — ABNORMAL LOW (ref 13.0–17.0)
MCH: 29.5 pg (ref 26.0–34.0)
MCHC: 33.1 g/dL (ref 30.0–36.0)
MCV: 89 fL (ref 80.0–100.0)
NRBC: 0 % (ref 0.0–0.2)
Platelets: 211 10*3/uL (ref 150–400)
RBC: 3.83 MIL/uL — AB (ref 4.22–5.81)
RDW: 13.9 % (ref 11.5–15.5)
WBC: 14.5 10*3/uL — AB (ref 4.0–10.5)

## 2018-07-20 LAB — BASIC METABOLIC PANEL
Anion gap: 7 (ref 5–15)
BUN: 15 mg/dL (ref 8–23)
CHLORIDE: 100 mmol/L (ref 98–111)
CO2: 27 mmol/L (ref 22–32)
Calcium: 8.1 mg/dL — ABNORMAL LOW (ref 8.9–10.3)
Creatinine, Ser: 1.18 mg/dL (ref 0.61–1.24)
GFR calc Af Amer: >60 mL/min (ref >60)
GFR calc non Af Amer: 58 mL/min — ABNORMAL LOW (ref >60)
Glucose, Bld: 153 mg/dL — ABNORMAL HIGH (ref 70–99)
POTASSIUM: 3.6 mmol/L (ref 3.5–5.1)
SODIUM: 134 mmol/L — AB (ref 135–145)

## 2018-07-20 MED ORDER — OXYCODONE-ACETAMINOPHEN 5-325 MG PO TABS: 1.0000 | ORAL_TABLET | Freq: Four times a day (QID) | ORAL | 0 refills | Status: DC | PRN

## 2018-07-20 MED FILL — OXYCODONE W/APAP 5/325 TAB: 5-325 | 4 days supply | Qty: 15 | Fill #0

## 2018-07-20 NOTE — Progress Notes (Signed)
Patient adamant about getting discharged, wanting to go home. Patient still with order for Vascular US ABI to be completed. Staff in Vascular US unable to give a time on when test will be completed.  Spoke with Ger[facility] and she stated OK to cancel test and to be discharged home at this time. Will discharge patient. Patient transported to exit via wheel chair. Damaris Geers, Bettina Gavia RN

## 2018-07-20 NOTE — Telephone Encounter (Signed)
Pt on TCM report admitted 07/19/18 for Right common femoral endarterectomy. Pt D/C 07/20/18, and will follow-up w/surgeon Dr. [name]. Does not need TCM call w/PCP.Marland KitchenJohny Chess

## 2018-07-20 NOTE — Discharge Instructions (Signed)
 [facility]  Discharge instructions  Lower Extremity Bypass Surgery  Please refer to the following instruction for your post-procedure care. Your surgeon or physician assistant will discuss any changes with you.  Activity  You are encouraged to walk as much as you can. You can slowly return to normal activities during the month after your surgery. Avoid strenuous activity and heavy lifting until your doctor tells you it's OK. Avoid activities such as vacuuming or swinging a golf club. Do not drive until your doctor give the OK and you are no longer taking prescription pain medications. It is also normal to have difficulty with sleep habits, eating and bowel movement after surgery. These will go away with time.  Bathing/Showering  You may shower after you go home. Do not soak in a bathtub, hot tub, or swim until the incision heals completely.  Incision Care  Clean your incision with mild soap and water. Shower every day. Pat the area dry with a clean towel. You do not need a bandage unless otherwise instructed. Do not apply any ointments or creams to your incision. If you have open wounds you will be instructed how to care for them or a visiting nurse may be arranged for you. If you have staples or sutures along your incision they will be removed at your post-op appointment. You may have skin glue on your incision. Do not peel it off. It will come off on its own in about one week. If you have a great deal of moisture in your groin, use a gauze help keep this area dry.  Diet  Resume your normal diet. There are no special food restrictions following this procedure. A low fat/ low cholesterol diet is recommended for all patients with vascular disease. In order to heal from your surgery, it is CRITICAL to get adequate nutrition. Your body requires vitamins, minerals, and protein. Vegetables are the best source of vitamins and minerals. Vegetables also provide the  perfect balance of protein. Processed food has little nutritional value, so try to avoid this.  Medications  Resume taking all your medications unless your doctor or nurse practitioner tells you not to. If your incision is causing pain, you may take over-the-counter pain relievers such as acetaminophen (Tylenol). If you were prescribed a stronger pain medication, please aware these medication can cause nausea and constipation. Prevent nausea by taking the medication with a snack or meal. Avoid constipation by drinking plenty of fluids and eating foods with high amount of fiber, such as fruits, vegetables, and grains. Take Colase 100 mg (an over-the-counter stool softener) twice a day as needed for constipation. Do not take Tylenol if you are taking prescription pain medications.  Follow Up  Our office will schedule a follow up appointment 2-3 weeks following discharge.  Please call us immediately for any of the following conditions  Severe or worsening pain in your legs or feet while at rest or while walking Increase pain, redness, warmth, or drainage (pus) from your incision site(s) Fever of 101 degree or higher The swelling in your leg with the bypass suddenly worsens and becomes more painful than when you were in the hospital If you have been instructed to feel your graft pulse then you should do so every day. If you can no longer feel this pulse, call the office immediately. Not all patients are given this instruction.  Leg swelling is common after leg bypass surgery.  The swelling should improve over a few months   following surgery. To improve the swelling, you may elevate your legs above the level of your heart while you are sitting or resting. Your surgeon or physician assistant may ask you to apply an ACE wrap or wear compression (TED) stockings to help to reduce swelling.  Reduce your risk of vascular disease  Stop smoking. If you would like help call [facility] at 1-800-QUIT-NOW  (1-800-784-8669) or [place] at 336-586-4000.  Manage your cholesterol Maintain a desired weight Control your diabetes weight Control your diabetes Keep your blood pressure down  If you have any questions, please call the office at [phone]   

## 2018-07-20 NOTE — Progress Notes (Signed)
Discharge instructions (including medications) discussed with and copy provided to patient/caregiver 

## 2018-07-20 NOTE — Evaluation (Signed)
Physical Therapy Evaluation Patient Details Name: [name] [name] MRN: [id number] DOB: [birth year] Today's Date: 07/20/2018   History of Present Illness  77yo male presenting for f/u after L external iliac angioplasty and stenting; claudication symptoms improved L LE, however continue to worsen R LE. He received R femoral artery endartectomy on 07/19/18. PMH HTN, LBP, PVD with claudication, angioplasty with stenting, lumbar laminectomy, multiple peripheral vascular interventions   Clinical Impression   Patient received up in chair, pleasant and willing to work with therapy this morning. Able to complete all transfers and gait approximately 530f with no device without difficulty, also demonstrated excellent performance with stair navigation with U railing as well. Per his report, he is at his baseline level of function. He was left up in the chair with all needs met and questions/concerns addressed. He does not appear to be in need of skilled PT services in the acute setting or following DC. PT signing off, thank you for the referral.     Follow Up Recommendations No PT follow up    Equipment Recommendations  None recommended by PT    Recommendations for Other Services       Precautions / Restrictions Precautions Precautions: None Restrictions Weight Bearing Restrictions: No      Mobility  Bed Mobility               General bed mobility comments: OOB in chair   Transfers Overall transfer level: Modified independent Equipment used: None                Ambulation/Gait Ambulation/Gait assistance: Modified independent (Device/Increase time) Gait Distance (Feet): 500 Feet Assistive device: None Gait Pattern/deviations: WFL(Within Functional Limits);Step-through pattern     General Gait Details: gait mechanics WNL, no concern for gait deviation or unsteadiness   Stairs Stairs: Yes Stairs assistance: Modified independent (Device/Increase time) Stair Management:  One rail Left;Alternating pattern;Forwards Number of Stairs: 3 General stair comments: no concerns for safety impairment or unsteadiness on stairs   Wheelchair Mobility    Modified Rankin (Stroke Patients Only)       Balance Overall balance assessment: No apparent balance deficits (not formally assessed)                                           Pertinent Vitals/Pain Pain Assessment: No/denies pain Faces Pain Scale: Hurts a little bit Pain Intervention(s): Limited activity within patient's tolerance;Monitored during session    [place] Private residence Living Arrangements: Alone Available Help at Discharge: Family;Available PRN/intermittently Type of Home: House Home Access: Stairs to enter Entrance Stairs-Rails: Can reach both Entrance Stairs-Number of Steps: 2 Home Layout: One level Home Equipment: Walker - 2 wheels Additional Comments: has RW but doesn't use it     Prior Function Level of Independence: Independent               Hand Dominance   Dominant Hand: Right    Extremity/Trunk Assessment   Upper Extremity Assessment Upper Extremity Assessment: Defer to OT evaluation    Lower Extremity Assessment Lower Extremity Assessment: RLE deficits/detail RLE Deficits / Details: Reports numbness at lateral portion of thigh, strength and function WFL     Cervical / Trunk Assessment Cervical / Trunk Assessment: Normal  Communication   Communication: No difficulties  Cognition Arousal/Alertness: Awake/alert Behavior During Therapy: WFL for tasks assessed/performed Overall Cognitive Status:  Within Functional Limits for tasks assessed                                        General Comments      Exercises     Assessment/Plan    PT Assessment Patent does not need any further PT services  PT Problem List         PT Treatment Interventions      PT Goals (Current  goals can be found in the Care Plan section)  Acute Rehab PT Goals Patient Stated Goal: go home  PT Goal Formulation: With patient Time For Goal Achievement: 08/03/18 Potential to Achieve Goals: Good    Frequency     Barriers to discharge        Co-evaluation               AM-PAC PT "6 Clicks" Daily Activity  Outcome Measure Difficulty turning over in bed (including adjusting bedclothes, sheets and blankets)?: None Difficulty moving from lying on back to sitting on the side of the bed? : None Difficulty sitting down on and standing up from a chair with arms (e.g., wheelchair, bedside commode, etc,.)?: None Help needed moving to and from a bed to chair (including a wheelchair)?: None Help needed walking in hospital room?: None Help needed climbing 3-5 steps with a railing? : None 6 Click Score: 24    End of Session   Activity Tolerance: Patient tolerated treatment well Patient left: in chair;with call bell/phone within reach   PT Visit Diagnosis: Pain Pain - Right/Left: Right Pain - part of body: Leg    Time: 2694-8546 PT Time Calculation (min) (ACUTE ONLY): 10 min   Charges:   PT Evaluation $PT Eval Low Complexity: 1 Low          Deniece Ree PT, DPT, CBIS  Supplemental Physical Therapist [place]    Pager [phone] Acute Rehab Office [phone]

## 2018-07-20 NOTE — Progress Notes (Addendum)
[facility] [place]   HPI: [name] [name] a 77 y.o.malewith history of hypertensionandhyperlipidemia who presents for interval follow-up after left external iliac angioplasty and stent placement. Patient previous had lifestyle limiting claudication in both legsand states he could not walk more than 100 feet given bilateralburningcalf pain.   Subjective  - He is doing well over all and wants to go home.   Objective 95/71 70 97.7 F (36.5 C) (Oral) 13 98%  Intake/Output Summary (Last 24 hours) at 07/20/2018 0717 Last data filed at 07/20/2018 0406 Gross per 24 hour  Intake 1740 ml  Output 1420 ml  Net 320 ml   Palpable DP pulse left followed by brisk doppler signals Left groin soft, incision healing well.  Dry 4 x 4 placed over groin incision Heart RRR Lungs non labored breathing   Assessment/Planning: POD # 1  Right common femoral thromboendarterectomy with bovine pericardial patch (patch carried onto SFA with SFA endarterectomy and eversion endarterectomy of profunda)  05/13/2018 Left external iliac stent placement with angioplasty (7 mm x 80 mm self-expanding Innova stent and angioplasty with 6 mm x 80 mm Mustang)  He will have the foley d/c'd this am.  Once he has independently voided and ambulated he will be stable for discharge home.  He is tolerating PO's well and pain is well controlled. F/U with DR. Elex Mainwaring in 2 weeks for incision check and repeat ABI's. Plavix was restarted yesterday WBC elevated encourage IS  [name] 07/20/2018 7:17 AM --  Laboratory Lab Results: Recent Labs    07/19/18 1421 07/20/18 0321  WBC 10.3 14.5*  HGB 12.9* 11.3*  HCT 39.3 34.1*  PLT 203 211   BMET Recent Labs    07/19/18 1421 07/20/18 0321  NA  --  134*  K  --  3.6  CL  --  100  CO2  --  27  GLUCOSE  --  153*  BUN  --  15  CREATININE 1.03 1.18  CALCIUM  --  8.1*    COAG Lab Results  Component Value Date   INR 1.06  07/12/2018   No results found for: PTT  I have seen and evaluated the patient. I agree with the PA note as documented above. POD#1 s/p R CFA endarterectomy.  Brisk DP/PT signals.  Foot warm.  OOB and mobilize with therapy.  Needs to void.  Plan for d/c today.  F/U 2-3 weeks for groin check.   [name], MD [facility] [place] Office: [phone] Pager: [phone]

## 2018-07-20 NOTE — Evaluation (Signed)
Occupational Therapy Evaluation Patient Details Name: [name] [name] MRN: [id number] DOB: 1941/07/31 Today's Date: 07/20/2018    History of Present Illness 77yo male presenting for f/u after L external iliac angioplasty and stenting; claudication symptoms improved L LE, however continue to worsen R LE. He received R femoral artery endartectomy on 07/19/18. PMH HTN, LBP, PVD with claudication, angioplasty with stenting, lumbar laminectomy, multiple peripheral vascular interventions    Clinical Impression   PTA, pt was living alone and was independent; planning for daughter to stay at dc. Currently, pt performing ADLs and functional mobility at supervision level. Provided education on LB ADLs, toilet transfer, and shower transfer with seat; pt demonstrated understanding. Answered all pt questions. Recommend dc home once medically stable per physician. All acute OT needs met and will sign off. Thank you.     Follow Up Recommendations  No OT follow up;Supervision - Intermittent    Equipment Recommendations  None recommended by OT    Recommendations for Other Services       Precautions / Restrictions Precautions Precautions: None Restrictions Weight Bearing Restrictions: No      Mobility Bed Mobility Overal bed mobility: Modified Independent             General bed mobility comments: Increased time  Transfers Overall transfer level: Modified independent Equipment used: None                  Balance Overall balance assessment: No apparent balance deficits (not formally assessed)                                         ADL either performed or assessed with clinical judgement   ADL Overall ADL's : Needs assistance/impaired                                       General ADL Comments: Pt performing ADLs and functional mobility at supervision level. Requiring increased time and effort for LB ADLs. Educating pt on compensatory  techniques for LB ADLs. Pt verbalized and demosntrated understanding.      Vision         Perception     Praxis      Pertinent Vitals/Pain Pain Assessment: Faces Faces Pain Scale: Hurts a little bit Pain Location: RLE Pain Intervention(s): Monitored during session     Hand Dominance Right   Extremity/Trunk Assessment Upper Extremity Assessment Upper Extremity Assessment: Overall WFL for tasks assessed   Lower Extremity Assessment Lower Extremity Assessment: RLE deficits/detail RLE Deficits / Details: Reports numbness at lateral portion of thigh, strength and function WFL    Cervical / Trunk Assessment Cervical / Trunk Assessment: Normal   Communication Communication Communication: No difficulties   Cognition Arousal/Alertness: Awake/alert Behavior During Therapy: WFL for tasks assessed/performed Overall Cognitive Status: Within Functional Limits for tasks assessed                                     General Comments  VSS    Exercises     Shoulder Instructions      Home Living Family/patient expects to be discharged to:: Private residence Living Arrangements: Alone Available Help at Discharge: Family;Available PRN/intermittently Type of Home: House Home Access: Stairs to enter Entrance  Stairs-Number of Steps: 2 Entrance Stairs-Rails: Can reach both Home Layout: One level     Bathroom Shower/Tub: Occupational psychologist: Standard     Home Equipment: Environmental consultant - 2 wheels;Shower seat - built in   Additional Comments: has RW but doesn't use it       Prior Functioning/Environment Level of Independence: Independent        Comments: Enjoys working in his lawn        OT Problem List: Decreased strength;Decreased range of motion;Decreased activity tolerance;Impaired balance (sitting and/or standing);Decreased knowledge of use of DME or AE;Decreased knowledge of precautions      OT Treatment/Interventions:      OT  Goals(Current goals can be found in the care plan section) Acute Rehab OT Goals Patient Stated Goal: go home  OT Goal Formulation: All assessment and education complete, DC therapy  OT Frequency:     Barriers to D/C:            Co-evaluation              AM-PAC PT "6 Clicks" Daily Activity     Outcome Measure Help from another person eating meals?: None Help from another person taking care of personal grooming?: None Help from another person toileting, which includes using toliet, bedpan, or urinal?: None Help from another person bathing (including washing, rinsing, drying)?: None Help from another person to put on and taking off regular upper body clothing?: None Help from another person to put on and taking off regular lower body clothing?: None 6 Click Score: 24   End of Session Equipment Utilized During Treatment: Gait belt Nurse Communication: Mobility status  Activity Tolerance: Patient tolerated treatment well Patient left: in chair;with call bell/phone within reach  OT Visit Diagnosis: Unsteadiness on feet (R26.81);Other abnormalities of gait and mobility (R26.89);Muscle weakness (generalized) (M62.81)                Time: 0131-4388 OT Time Calculation (min): 20 min Charges:  OT General Charges $OT Visit: 1 Visit OT Evaluation $OT Eval Low Complexity: 1 Low  [name] [name] MSOT, OTR/L Acute Rehab Pager: [phone] Office: [place] 07/20/2018, 10:41 AM

## 2018-07-21 ENCOUNTER — Telehealth: Payer: Self-pay | Admitting: Vascular Surgery

## 2018-07-21 NOTE — Telephone Encounter (Signed)
-----   Message from Ulyses Amor, [place] sent at 07/20/2018  7:25 AM EST -----  S/P left femoral endarterectomy f/u with Dr. Carlis Abbott in 2-3 weeks  With ABI's if they did not get completed prior to his discharge in the hospital on 07/20/2018.

## 2018-07-21 NOTE — Telephone Encounter (Signed)
sch appt lvm mld ltr 08/10/18 1045am p/o MD

## 2018-07-22 NOTE — Discharge Summary (Signed)
[facility] Discharge Summary   Patient ID:  [name] [name] MRN: [id number] DOB/AGE: 77-Oct-1942 77 y.o.  Admit date: 07/19/2018 Discharge date: 07/20/2018 Date of Surgery: 07/19/2018 Surgeon: Surgeon(s): Carlis Abbott Gwenyth Allegra, MD Elam Dutch, MD  Admission Diagnosis: claudication  Discharge Diagnoses:  claudication  Secondary Diagnoses: Past Medical History:  Diagnosis Date  . Abdominal pain, unspecified site 10/16/2009  . Anal fissure 04/24/2008  . Arthritis   . B12 deficiency 10/11/2014  . BACK PAIN 01/10/2009  . Barrett's esophagus 1997  . BARRETTS ESOPHAGUS 08/16/2007  . BREAST HYPERTROPHY 02/16/2009  . CARBUNCLE, NECK 02/14/2008  . Cataract   . DIVERTICULOSIS, COLON 06/28/2007  . Full dentures   . GERD 06/28/2007  . GLUCOSE INTOLERANCE 08/15/2008  . Headache(784.0) 11/13/2009  . HERNIA, UMBILICAL W/OBSTRUCTION W/O GANGRENE 06/28/2007  . HTN (hypertension) 07/12/2012  . HYPERLIPIDEMIA 06/28/2007  . HYPERTENSION 06/28/2007  . Impaired glucose tolerance 04/13/2011  . INSOMNIA-SLEEP DISORDER-UNSPEC 10/10/2008  . LEG PAIN, BILATERAL 02/16/2009  . LOW BACK PAIN 06/28/2007  . MASTITIS 01/10/2009  . NECK PAIN 05/31/2008  . NEPHROLITHIASIS, HX OF 06/28/2007  . PROCTOSIGMOIDITIS, ULCERATIVE 1997  . PVD (peripheral vascular disease) with claudication, lifestyle limiting, ABI rt. 0.67, lt. 0.73 07/12/2012  . S/P angioplasty with stent, after DB athrectomy to Rt. ext. iliac and Rt. SFA 07/12/2012  . Skin cancer    right hand  . Spinal stenosis at L4-L5 level   . Stenosis of left carotid artery, mod to severe by dopplers 09/29/2012  . TESTICULAR PAIN, RIGHT 10/25/2010  . VITAMIN D DEFICIENCY 11/13/2009    Procedure(s): ENDARTERECTOMY RIGHT FEMORAL ARTERY RIGHT FEMORAL PATCH ANGIOPLASTY WITH XENOSURE BIOLOGIC PATCH  Discharged Condition: stable  HPI:  [name] [name] a 77 y.o.malewith history of hypertensionandhyperlipidemia who presents for interval follow-up  after left external iliac angioplasty and stent placement. Patient previous had lifestyle limiting claudication in both legsand states he could not walk more than 100 feet given bilateralburningcalf pain. Since intervention on the left he states he can now walk to his mailbox and back. He's hadsignificant improvement in his left leg symptoms. Unfortunately his right leg still remains fairly symptomatic and still has lifestyle limiting claudication in his right leg. He denies any rest pain or tissue loss in the right foot. He is on Plavix.   Hospital Course:  [name] [name] is a 77 y.o. male is S/P  Procedure(s): ENDARTERECTOMY RIGHT FEMORAL ARTERY RIGHT FEMORAL PATCH ANGIOPLASTY WITH [place] [facility] healing incision without hematoma.  Uneventful over night stay with pain well controlled.  Discharge in stable condition.  Palpable DP pulse left followed by brisk doppler signals.    Significant Diagnostic Studies: CBC Lab Results  Component Value Date   WBC 14.5 (H) 07/20/2018   HGB 11.3 (L) 07/20/2018   HCT 34.1 (L) 07/20/2018   MCV 89.0 07/20/2018   PLT 211 07/20/2018    BMET    Component Value Date/Time   NA 134 (L) 07/20/2018 0321   K 3.6 07/20/2018 0321   CL 100 07/20/2018 0321   CO2 27 07/20/2018 0321   GLUCOSE 153 (H) 07/20/2018 0321   BUN 15 07/20/2018 0321   CREATININE 1.18 07/20/2018 0321   CALCIUM 8.1 (L) 07/20/2018 0321   GFRNONAA 58 (L) 07/20/2018 0321   GFRAA >60 07/20/2018 0321   COAG Lab Results  Component Value Date   INR 1.06 07/12/2018     Disposition:  Discharge to :Home Discharge Instructions    Call MD for:  redness, tenderness, or signs of infection (pain, swelling, bleeding, redness, odor or green/yellow discharge around incision site)   Complete by:  As directed    Call MD for:  severe or increased pain, loss or decreased feeling  in affected limb(s)   Complete by:  As directed    Call MD for:  temperature >100.5    Complete by:  As directed    Resume previous diet   Complete by:  As directed      Allergies as of 07/20/2018      Reactions   Contrast Media [iodinated Diagnostic Agents] Other (See Comments)   Break out in welts   Statins Other (See Comments)   Legs pain: Atorvastatin and Lovastatin      Medication List    TAKE these medications   allopurinol 100 MG tablet Commonly known as:  ZYLOPRIM Take 2 tablets (200 mg total) by mouth daily.   clopidogrel 75 MG tablet Commonly known as:  PLAVIX TAKE 1 TABLET BY MOUTH  DAILY   cyanocobalamin 1000 MCG tablet Take 1,000 mcg by mouth daily.   lisinopril-hydrochlorothiazide 20-25 MG tablet Commonly known as:  PRINZIDE,ZESTORETIC TAKE 1 TABLET BY MOUTH  DAILY   mesalamine 0.375 g 24 hr capsule Commonly known as:  APRISO Take 4 capsules (1.5 g total) by mouth daily. Take 4 tablets all at one time.   oxyCODONE-acetaminophen 5-325 MG tablet Commonly known as:  PERCOCET/ROXICET Take 1 tablet by mouth every 6 (six) hours as needed for moderate pain.   pantoprazole 40 MG tablet Commonly known as:  PROTONIX Take 1 tablet (40 mg total) by mouth daily.   Vitamin D (Ergocalciferol) 1.25 MG (50000 UT) Caps capsule Commonly known as:  DRISDOL Take 1 capsule (50,000 Units total) by mouth every 7 (seven) days.   zolpidem 10 MG tablet Commonly known as:  AMBIEN Take 1 tablet (10 mg total) by mouth at bedtime as needed for sleep.      Verbal and written Discharge instructions given to the patient. Wound care per Discharge AVS Follow-up Information    [name], MD Follow up in 2 week(s).   Specialty:  Vascular Surgery Why:  office will call Contact information: [address] [place] [address] [phone]           Signed: [name] 07/22/2018, 7:59 PM

## 2018-07-22 NOTE — Consult Note (Signed)
            [facility] CM Primary Care Navigator  07/22/2018  [name] [name] [birth year] [id number]    Attempt to seepatient at the bedside to identify possible discharge needs buthe wasalready discharged home.  Per MD note, patient was admitted for surgery for the diagnosis of right lower extremity claudication. (underwent right common femoral thromboendarterectomy with bovine pericardial patch)  Patient has discharge instruction to follow-up withvascular surgery in 2 weeks.  Primary care provider's office is listed as providing transition of care (TOC) follow-up.    For additional questions please contact:  Edwena Felty A. Britten Seyfried, BSN, RN-BC [facility] PRIMARY CARE Navigator Cell: [phone]

## 2018-07-30 ENCOUNTER — Other Ambulatory Visit: Payer: Self-pay

## 2018-07-30 MED ORDER — PANTOPRAZOLE SODIUM 40 MG PO TBEC: 40.0000 mg | DELAYED_RELEASE_TABLET | Freq: Every day | ORAL | 3 refills | Status: DC

## 2018-08-10 ENCOUNTER — Encounter: Payer: Self-pay | Admitting: Vascular Surgery

## 2018-08-10 ENCOUNTER — Ambulatory Visit (INDEPENDENT_AMBULATORY_CARE_PROVIDER_SITE_OTHER): Payer: Medicare Other | Admitting: Vascular Surgery

## 2018-08-10 VITALS — BP 145/72 | HR 76 | Temp 97.2°F | Resp 18 | Ht 68.0 in | Wt 227.2 lb

## 2018-08-10 DIAGNOSIS — I739 Peripheral vascular disease, unspecified: Secondary | ICD-10-CM

## 2018-08-10 NOTE — Progress Notes (Signed)
Patient name: [name] [name] MRN: [id number] DOB: 1941-01-04 Sex: male  REASON FOR VISIT: Post-op check status post right common femoral endarterectomy with bovine patch  HPI: [name] [name] is a 77 y.o. male presents for postop check after right common femoral endarterectomy with bovine patch and profundoplasty on 07/19/2018 for intermittent claudication of the right lower extremity.  Follow-up today states his groin is healing without any issues.  Overall he feels like he is doing really well.  We previously performed a left iliac intervention with stent placement prior to his right femoral endarterectomy and now he states he is able to walk to the mailbox and the grocery store without any significant claudication symptoms.  Overall he seems pleased.  He has no specific concerns today.  Past Medical History:  Diagnosis Date  . Abdominal pain, unspecified site 10/16/2009  . Anal fissure 04/24/2008  . Arthritis   . B12 deficiency 10/11/2014  . BACK PAIN 01/10/2009  . Barrett's esophagus 1997  . BARRETTS ESOPHAGUS 08/16/2007  . BREAST HYPERTROPHY 02/16/2009  . CARBUNCLE, NECK 02/14/2008  . Cataract   . DIVERTICULOSIS, COLON 06/28/2007  . Full dentures   . GERD 06/28/2007  . GLUCOSE INTOLERANCE 08/15/2008  . Headache(784.0) 11/13/2009  . HERNIA, UMBILICAL W/OBSTRUCTION W/O GANGRENE 06/28/2007  . HTN (hypertension) 07/12/2012  . HYPERLIPIDEMIA 06/28/2007  . HYPERTENSION 06/28/2007  . Impaired glucose tolerance 04/13/2011  . INSOMNIA-SLEEP DISORDER-UNSPEC 10/10/2008  . LEG PAIN, BILATERAL 02/16/2009  . LOW BACK PAIN 06/28/2007  . MASTITIS 01/10/2009  . NECK PAIN 05/31/2008  . NEPHROLITHIASIS, HX OF 06/28/2007  . PROCTOSIGMOIDITIS, ULCERATIVE 1997  . PVD (peripheral vascular disease) with claudication, lifestyle limiting, ABI rt. 0.67, lt. 0.73 07/12/2012  . S/P angioplasty with stent, after DB athrectomy to Rt. ext. iliac and Rt. SFA 07/12/2012  . Skin cancer    right hand  . Spinal stenosis at L4-L5  level   . Stenosis of left carotid artery, mod to severe by dopplers 09/29/2012  . TESTICULAR PAIN, RIGHT 10/25/2010  . VITAMIN D DEFICIENCY 11/13/2009    Past Surgical History:  Procedure Laterality Date  . ABDOMINAL AORTOGRAM N/A 05/13/2018   Procedure: ABDOMINAL AORTOGRAM;  Surgeon: [name], MD;  Location: [place] [facility];  Service: Cardiovascular;  Laterality: N/A;  . anal fissure surgury     pt unsure of this  . ATHERECTOMY  09/28/2012  . ATHERECTOMY N/A 07/12/2012   Procedure: ATHERECTOMY;  Surgeon: [name], MD;  Location: [facility] CATH LAB;  Service: Cardiovascular;  Laterality: N/A;  . ATHERECTOMY N/A 09/28/2012   Procedure: ATHERECTOMY;  Surgeon: [name], MD;  Location: [facility] CATH LAB;  Service: Cardiovascular;  Laterality: N/A;  . bilateral inguinal hernia with mesh and umbilical hernia repairs  09/2007  . CATARACT EXTRACTION Bilateral    bilateral  . COLONOSCOPY    . ENDARTERECTOMY FEMORAL Right 07/19/2018   Procedure: ENDARTERECTOMY RIGHT FEMORAL ARTERY;  Surgeon: [name], MD;  Location: [place];  Service: Vascular;  Laterality: Right;  . GROIN DISSECTION Right 07/20/2013   Procedure: GROIN EXPLORATION, EXCISIONAL BIOPSY RIGHT INGUINAL LYMPH NODES;  Surgeon: Harl Bowie, MD;  Location: [place];  Service: General;  Laterality: Right;  . HERNIA REPAIR    . left knee arthroscopy    . LOWER EXTREMITY ANGIOGRAPHY Bilateral 05/13/2018   Procedure: Lower Extremity Angiography;  Surgeon: [name], MD;  Location: [place] [facility];  Service: Cardiovascular;  Laterality: Bilateral;  . LUMBAR LAMINECTOMY/DECOMPRESSION  MICRODISCECTOMY N/A 11/05/2016   Procedure: Microlumbar decompression L2-3, L3-4, L4-5, lateral mass fusion L4-5;  Surgeon: Susa Day, MD;  Location: [facility];  Service: Orthopedics;  Laterality: N/A;  . PATCH ANGIOPLASTY Right 07/19/2018   Procedure: RIGHT FEMORAL PATCH ANGIOPLASTY WITH XENOSURE BIOLOGIC  PATCH;  Surgeon: [name], MD;  Location: [place];  Service: Vascular;  Laterality: Right;  . PERCUTANEOUS STENT INTERVENTION  09/28/2012   Procedure: PERCUTANEOUS STENT INTERVENTION;  Surgeon: [name], MD;  Location: [facility] CATH LAB;  Service: Cardiovascular;;  . PERIPHERAL VASCULAR INTERVENTION  05/13/2018   Procedure: PERIPHERAL VASCULAR INTERVENTION;  Surgeon: [name], MD;  Location: [place] [facility];  Service: Cardiovascular;;  Ext. Iliac  . UPPER GI ENDOSCOPY      Family History  Problem Relation Age of Onset  . COPD Mother   . Cerebral aneurysm Mother   . COPD Father   . Colon cancer Neg Hx   . Esophageal cancer Neg Hx   . Rectal cancer Neg Hx   . Stomach cancer Neg Hx     SOCIAL HISTORY: Social History   Tobacco Use  . Smoking status: Former Smoker    Last attempt to quit: 09/08/1972    Years since quitting: 45.9  . Smokeless tobacco: Former Systems developer    Types: Chew  Substance Use Topics  . Alcohol use: Not Currently    Alcohol/week: 5.0 standard drinks    Types: 5 Cans of beer per week    Allergies  Allergen Reactions  . Contrast Media [Iodinated Diagnostic Agents] Other (See Comments)    Break out in welts  . Statins Other (See Comments)    Legs pain: Atorvastatin and Lovastatin    Current Outpatient Medications  Medication Sig Dispense Refill  . allopurinol (ZYLOPRIM) 100 MG tablet Take 2 tablets (200 mg total) by mouth daily. 180 tablet 6  . clopidogrel (PLAVIX) 75 MG tablet TAKE 1 TABLET BY MOUTH  DAILY (Patient taking differently: Take 75 mg by mouth daily. ) 90 tablet 2  . cyanocobalamin 1000 MCG tablet Take 1,000 mcg by mouth daily.     [name] lisinopril-hydrochlorothiazide (PRINZIDE,ZESTORETIC) 20-25 MG tablet TAKE 1 TABLET BY MOUTH  DAILY 90 tablet 2  . mesalamine (APRISO) 0.375 g 24 hr capsule Take 4 capsules (1.5 g total) by mouth daily. Take 4 tablets all at one time. 360 capsule 3  . oxyCODONE-acetaminophen (PERCOCET/ROXICET) 5-325  MG tablet Take 1 tablet by mouth every 6 (six) hours as needed for moderate pain. 15 tablet 0  . pantoprazole (PROTONIX) 40 MG tablet Take 1 tablet (40 mg total) by mouth daily. 90 tablet 3  . Vitamin D, Ergocalciferol, (DRISDOL) 50000 units CAPS capsule Take 1 capsule (50,000 Units total) by mouth every 7 (seven) days. 12 capsule 0  . zolpidem (AMBIEN) 10 MG tablet Take 1 tablet (10 mg total) by mouth at bedtime as needed for sleep. 30 tablet 2   No current facility-administered medications for this visit.     REVIEW OF SYSTEMS:  [X]  denotes positive finding, [ ]  denotes negative finding Cardiac  Comments:  Chest pain or chest pressure:    Shortness of breath upon exertion:    Short of breath when lying flat:    Irregular heart rhythm:        Vascular    Pain in calf, thigh, or hip brought on by ambulation:    Pain in feet at night that wakes you up from your sleep:  Blood clot in your veins:    Leg swelling:         Pulmonary    Oxygen at home:    Productive cough:     Wheezing:         Neurologic    Sudden weakness in arms or legs:     Sudden numbness in arms or legs:     Sudden onset of difficulty speaking or slurred speech:    Temporary loss of vision in one eye:     Problems with dizziness:         Gastrointestinal    Blood in stool:     Vomited blood:         Genitourinary    Burning when urinating:     Blood in urine:        Psychiatric    Major depression:         Hematologic    Bleeding problems:    Problems with blood clotting too easily:        Skin    Rashes or ulcers:        Constitutional    Fever or chills:      PHYSICAL EXAM: Vitals:   08/10/18 1051  BP: (!) 145/72  Pulse: 76  Resp: 18  Temp: (!) 97.2 F (36.2 C)  TempSrc: Oral  SpO2: 100%  Weight: 227 lb 3.2 oz (103.1 kg)  Height: 5' 8"  (1.727 m)    GENERAL: The patient is a well-nourished male, in no acute distress. The vital signs are documented above. CARDIAC: There is a  regular rate and rhythm.  VASCULAR:  Right vertical groin incision healing without drainage or discharge, palpable bilateral femoral pulses Weakly palpable R DP pulse No tissue loss either lower extremity PULMONARY: There is good air exchange bilaterally without wheezing or rales. ABDOMEN: Soft and non-tender with normal pitched bowel sounds.  NEUROLOGIC: No focal weakness or paresthesias are detected. SKIN: There are no ulcers or rashes noted. PSYCHIATRIC: The patient has a normal affect.  DATA:   None to review.  Assessment/Plan:  Overall seems to be doing well after her right common femoral endarterectomy with profundoplasty and bovine pericardial patch on 07/19/2018 for intermittent claudication.  Now is able to walk to mailbox and the grocery store without any significant claudication symptoms.  I will plan to see him back in 3 months with lower extremity arterial duplex bilaterally with ABIs given multiple previous bilateral SFA interventions prior to my initial evaluation with plans for ongoing surveillance and monitoring for symptom progression.    [name], MD [facility] Office: [phone] Pager: [place]

## 2018-09-15 ENCOUNTER — Other Ambulatory Visit: Payer: Self-pay | Admitting: Internal Medicine

## 2018-09-15 NOTE — Telephone Encounter (Signed)
Done erx 

## 2018-10-19 ENCOUNTER — Telehealth: Payer: Self-pay | Admitting: Gastroenterology

## 2018-10-19 MED ORDER — PREDNISONE 10 MG PO TABS: ORAL_TABLET | ORAL | 0 refills | Status: DC

## 2018-10-19 NOTE — Telephone Encounter (Signed)
Prednisone 40 mg po qd x 5d, 30 mg po qd x 5d, 20 mg po qd x 5d, 10 mg po x 5d, 10 mg po qod, x 10d, then dc

## 2018-10-19 NOTE — Telephone Encounter (Signed)
Left message for patient to call back  

## 2018-10-19 NOTE — Telephone Encounter (Signed)
Patient's daughter notified  rx sent

## 2018-10-19 NOTE — Telephone Encounter (Signed)
Patient's daughter reports that father is having urgency, diarrhea, rectal bleeding for the last few weeks.  Symptoms are worsening.  He states that you offered a rx of prednisone at the last office visit if he had these symptoms.  Patient is maintained on his Apriso for his UC.  Please advise

## 2018-11-02 ENCOUNTER — Encounter: Payer: Self-pay | Admitting: Gastroenterology

## 2018-11-02 ENCOUNTER — Ambulatory Visit: Payer: Medicare Other | Admitting: Gastroenterology

## 2018-11-02 ENCOUNTER — Other Ambulatory Visit (INDEPENDENT_AMBULATORY_CARE_PROVIDER_SITE_OTHER): Payer: Medicare Other

## 2018-11-02 VITALS — BP 130/60 | HR 74 | Ht 68.0 in | Wt 233.5 lb

## 2018-11-02 DIAGNOSIS — K51518 Left sided colitis with other complication: Secondary | ICD-10-CM

## 2018-11-02 DIAGNOSIS — K2270 Barrett's esophagus without dysplasia: Secondary | ICD-10-CM

## 2018-11-02 DIAGNOSIS — K529 Noninfective gastroenteritis and colitis, unspecified: Secondary | ICD-10-CM | POA: Diagnosis not present

## 2018-11-02 DIAGNOSIS — A09 Infectious gastroenteritis and colitis, unspecified: Secondary | ICD-10-CM | POA: Diagnosis not present

## 2018-11-02 DIAGNOSIS — R197 Diarrhea, unspecified: Secondary | ICD-10-CM

## 2018-11-02 DIAGNOSIS — K591 Functional diarrhea: Secondary | ICD-10-CM | POA: Diagnosis not present

## 2018-11-02 LAB — CBC WITH DIFFERENTIAL/PLATELET
BASOS PCT: 0.4 % (ref 0.0–3.0)
Basophils Absolute: 0 10*3/uL (ref 0.0–0.1)
EOS PCT: 0 % (ref 0.0–5.0)
Eosinophils Absolute: 0 10*3/uL (ref 0.0–0.7)
HCT: 46.8 % (ref 39.0–52.0)
HEMOGLOBIN: 15.5 g/dL (ref 13.0–17.0)
LYMPHS PCT: 2.2 % — AB (ref 12.0–46.0)
Lymphs Abs: 0.3 10*3/uL — ABNORMAL LOW (ref 0.7–4.0)
MCHC: 33.2 g/dL (ref 30.0–36.0)
MCV: 86.5 fl (ref 78.0–100.0)
Monocytes Absolute: 0.2 10*3/uL (ref 0.1–1.0)
Monocytes Relative: 1.9 % — ABNORMAL LOW (ref 3.0–12.0)
Neutro Abs: 11.6 10*3/uL — ABNORMAL HIGH (ref 1.4–7.7)
Neutrophils Relative %: 95.5 % — ABNORMAL HIGH (ref 43.0–77.0)
Platelets: 245 10*3/uL (ref 150.0–400.0)
RBC: 5.41 Mil/uL (ref 4.22–5.81)
RDW: 14.2 % (ref 11.5–15.5)
WBC: 12.1 10*3/uL — AB (ref 4.0–10.5)

## 2018-11-02 LAB — TSH: TSH: 0.6 u[IU]/mL (ref 0.35–4.50)

## 2018-11-02 LAB — COMPREHENSIVE METABOLIC PANEL
ALK PHOS: 56 U/L (ref 39–117)
ALT: 19 U/L (ref 0–53)
AST: 14 U/L (ref 0–37)
Albumin: 4.3 g/dL (ref 3.5–5.2)
BUN: 22 mg/dL (ref 6–23)
CO2: 25 mEq/L (ref 19–32)
Calcium: 9.2 mg/dL (ref 8.4–10.5)
Chloride: 100 mEq/L (ref 96–112)
Creatinine, Ser: 1.05 mg/dL (ref 0.40–1.50)
GFR: 68.42 mL/min (ref >60.00)
GLUCOSE: 148 mg/dL — AB (ref 70–99)
POTASSIUM: 4.3 meq/L (ref 3.5–5.1)
SODIUM: 134 meq/L — AB (ref 135–145)
TOTAL PROTEIN: 7 g/dL (ref 6.0–8.3)
Total Bilirubin: 0.4 mg/dL (ref 0.2–1.2)

## 2018-11-02 LAB — SEDIMENTATION RATE: Sed Rate: 8 mm/hr (ref 0–20)

## 2018-11-02 LAB — C-REACTIVE PROTEIN

## 2018-11-02 MED ORDER — PREDNISONE 10 MG PO TABS: ORAL_TABLET | ORAL | 0 refills | Status: DC

## 2018-11-02 NOTE — Patient Instructions (Addendum)
Your provider has requested that you go to the basement level for lab work before leaving today. Press "B" on the elevator. The lab is located at the first door on the left as you exit the elevator.  We have sent the following medications to your pharmacy for you to pick up at your convenience:prednisone taper:  Take 30 mg (3 tablets) daily x 2 weeks, then reduce to 20 mg (2 tablets)daily x 2 weeks, then reduce to 10 mg (1 tablet) daily x 2 weeks then reduce to 10 mg (1 tablet) every other day x 2 weeks then stop.  Thank you for choosing me and [facility].  [name]. Dagoberto Ligas., MD., Mar[facility] Regal

## 2018-11-02 NOTE — Progress Notes (Signed)
    History of Present Illness: This is a 78 year old male with left sided colitis currently taking Prednisone taper for a flair. He is accompanied by his daughter. His diarrhea improved on Prednisone 40 mg and 30 mg to 2 times per day however since tapering to 20 mg his diarrhea has returned to 4 to 5 times per day. No recent antibiotic use. No fevers, chills, rectal bleeding, abdominal pain.    Current Medications, Allergies, Past Medical History, Past Surgical History, Family History and Social History were reviewed in [employer] record.  Physical Exam: General: Well developed, well nourished, obese, no acute distress Head: Normocephalic and atraumatic Eyes:  sclerae anicteric, EOMI Ears: Normal auditory acuity Mouth: No deformity or lesions Lungs: Clear throughout to auscultation Heart: Regular rate and rhythm; no murmurs, rubs or bruits Abdomen: Soft, non tender and non distended. No masses, hepatosplenomegaly or hernias noted. Normal Bowel sounds Rectal: Not done Musculoskeletal: Symmetrical with no gross deformities  Pulses:  Normal pulses noted Extremities: No clubbing, cyanosis, edema or deformities noted Neurological: Alert oriented x 4, grossly nonfocal Psychological:  Alert and cooperative. Normal mood and affect   Assessment and Recommendations:  1.  Left sided colitis, flare. CBC, CMP, TSH, ESR, CRP today. GI pathogen panel. Resume Prednisone 30 mg daily and begin a slower taper as outlined. Continue Apriso at current dose. Surveillance colonoscopy is due this year. REV in 6 weeks.   2. Barrett's esophagus, short segment, without dysplasia. Surveillance EGD is due this year at time of colonoscopy. Continue pantoprazole 40 mg po daily.

## 2018-11-05 ENCOUNTER — Other Ambulatory Visit: Payer: Self-pay

## 2018-11-05 DIAGNOSIS — I739 Peripheral vascular disease, unspecified: Secondary | ICD-10-CM

## 2018-11-08 LAB — GASTROINTESTINAL PATHOGEN PANEL PCR
C. DIFFICILE TOX A/B, PCR: NOT DETECTED
CAMPYLOBACTER, PCR: NOT DETECTED
CRYPTOSPORIDIUM, PCR: NOT DETECTED
E coli (ETEC) LT/ST PCR: NOT DETECTED
E coli (STEC) stx1/stx2, PCR: NOT DETECTED
E coli 0157, PCR: NOT DETECTED
Giardia lamblia, PCR: NOT DETECTED
Norovirus, PCR: NOT DETECTED
ROTAVIRUS, PCR: NOT DETECTED
Salmonella, PCR: NOT DETECTED
Shigella, PCR: NOT DETECTED

## 2018-11-09 ENCOUNTER — Other Ambulatory Visit: Payer: Self-pay

## 2018-11-09 ENCOUNTER — Encounter: Payer: Self-pay | Admitting: Vascular Surgery

## 2018-11-09 ENCOUNTER — Ambulatory Visit (INDEPENDENT_AMBULATORY_CARE_PROVIDER_SITE_OTHER): Payer: Medicare Other | Admitting: Vascular Surgery

## 2018-11-09 ENCOUNTER — Ambulatory Visit (HOSPITAL_COMMUNITY)
Admission: RE | Admit: 2018-11-09 | Discharge: 2018-11-09 | Disposition: A | Discharge status: Home / Self Care | Payer: Medicare Other | Source: Ambulatory Visit | Attending: Surgery | Admitting: Surgery

## 2018-11-09 ENCOUNTER — Ambulatory Visit (INDEPENDENT_AMBULATORY_CARE_PROVIDER_SITE_OTHER)
Admission: RE | Admit: 2018-11-09 | Discharge: 2018-11-09 | Disposition: A | Discharge status: Home / Self Care | Payer: Medicare Other | Source: Ambulatory Visit | Attending: Surgery | Admitting: Surgery

## 2018-11-09 VITALS — BP 128/68 | HR 75 | Temp 97.5°F | Resp 20 | Wt 230.0 lb

## 2018-11-09 DIAGNOSIS — I739 Peripheral vascular disease, unspecified: Secondary | ICD-10-CM

## 2018-11-09 NOTE — Progress Notes (Signed)
Patient name: [name] [name] MRN: [id number] DOB: 1941-08-26 Sex: male  REASON FOR VISIT: 3 month follow-up status post right common femoral endarterectomy with bovine patch/ left external iliac stent for lower extremity claudication  HPI: [name] [name] is a 78 y.o. male presents for 3 month follow-up and surveillance after right common femoral endarterectomy with bovine patch and profundoplasty on 07/19/2018 for intermittent claudication of the right lower extremity.  He also underwent left external iliac stent on 05/13/18 for left lower extremity claudication.  Feels he is doing well.  Can now walk to his mailbox and through [place].  Overall very happy with his progress.  Past Medical History:  Diagnosis Date  . Abdominal pain, unspecified site 10/16/2009  . Anal fissure 04/24/2008  . Arthritis   . B12 deficiency 10/11/2014  . BACK PAIN 01/10/2009  . Barrett's esophagus 1997  . BARRETTS ESOPHAGUS 08/16/2007  . BREAST HYPERTROPHY 02/16/2009  . CARBUNCLE, NECK 02/14/2008  . Cataract   . DIVERTICULOSIS, COLON 06/28/2007  . Full dentures   . GERD 06/28/2007  . GLUCOSE INTOLERANCE 08/15/2008  . Headache(784.0) 11/13/2009  . HERNIA, UMBILICAL W/OBSTRUCTION W/O GANGRENE 06/28/2007  . HTN (hypertension) 07/12/2012  . HYPERLIPIDEMIA 06/28/2007  . HYPERTENSION 06/28/2007  . Impaired glucose tolerance 04/13/2011  . INSOMNIA-SLEEP DISORDER-UNSPEC 10/10/2008  . LEG PAIN, BILATERAL 02/16/2009  . LOW BACK PAIN 06/28/2007  . MASTITIS 01/10/2009  . NECK PAIN 05/31/2008  . NEPHROLITHIASIS, HX OF 06/28/2007  . PROCTOSIGMOIDITIS, ULCERATIVE 1997  . PVD (peripheral vascular disease) with claudication, lifestyle limiting, ABI rt. 0.67, lt. 0.73 07/12/2012  . S/P angioplasty with stent, after DB athrectomy to Rt. ext. iliac and Rt. SFA 07/12/2012  . Skin cancer    right hand  . Spinal stenosis at L4-L5 level   . Stenosis of left carotid artery, mod to severe by dopplers 09/29/2012  . TESTICULAR PAIN, RIGHT 10/25/2010    . VITAMIN D DEFICIENCY 11/13/2009    Past Surgical History:  Procedure Laterality Date  . ABDOMINAL AORTOGRAM N/A 05/13/2018   Procedure: ABDOMINAL AORTOGRAM;  Surgeon: [name], MD;  Location: [place] [facility];  Service: Cardiovascular;  Laterality: N/A;  . anal fissure surgury     pt unsure of this  . ATHERECTOMY  09/28/2012  . ATHERECTOMY N/A 07/12/2012   Procedure: ATHERECTOMY;  Surgeon: [name], MD;  Location: [facility] CATH LAB;  Service: Cardiovascular;  Laterality: N/A;  . ATHERECTOMY N/A 09/28/2012   Procedure: ATHERECTOMY;  Surgeon: [name], MD;  Location: [facility] CATH LAB;  Service: Cardiovascular;  Laterality: N/A;  . bilateral inguinal hernia with mesh and umbilical hernia repairs  09/2007  . CATARACT EXTRACTION Bilateral    bilateral  . COLONOSCOPY    . ENDARTERECTOMY FEMORAL Right 07/19/2018   Procedure: ENDARTERECTOMY RIGHT FEMORAL ARTERY;  Surgeon: [name], MD;  Location: [place];  Service: Vascular;  Laterality: Right;  . GROIN DISSECTION Right 07/20/2013   Procedure: GROIN EXPLORATION, EXCISIONAL BIOPSY RIGHT INGUINAL LYMPH NODES;  Surgeon: Harl Bowie, MD;  Location: [place];  Service: General;  Laterality: Right;  . HERNIA REPAIR    . left knee arthroscopy    . LOWER EXTREMITY ANGIOGRAPHY Bilateral 05/13/2018   Procedure: Lower Extremity Angiography;  Surgeon: [name], MD;  Location: [place] [facility];  Service: Cardiovascular;  Laterality: Bilateral;  . LUMBAR LAMINECTOMY/DECOMPRESSION MICRODISCECTOMY N/A 11/05/2016   Procedure: Microlumbar decompression L2-3, L3-4, L4-5, lateral mass fusion L4-5;  Surgeon: Susa Day, MD;  Location: [facility];  Service: Orthopedics;  Laterality: N/A;  . PATCH ANGIOPLASTY Right 07/19/2018   Procedure: RIGHT FEMORAL PATCH ANGIOPLASTY WITH XENOSURE BIOLOGIC PATCH;  Surgeon: [name], MD;  Location: [place];  Service: Vascular;  Laterality: Right;  . PERCUTANEOUS  STENT INTERVENTION  09/28/2012   Procedure: PERCUTANEOUS STENT INTERVENTION;  Surgeon: [name], MD;  Location: [facility] CATH LAB;  Service: Cardiovascular;;  . PERIPHERAL VASCULAR INTERVENTION  05/13/2018   Procedure: PERIPHERAL VASCULAR INTERVENTION;  Surgeon: [name], MD;  Location: [place] [facility];  Service: Cardiovascular;;  Ext. Iliac  . UPPER GI ENDOSCOPY      Family History  Problem Relation Age of Onset  . COPD Mother   . Cerebral aneurysm Mother   . COPD Father   . Colon cancer Neg Hx   . Esophageal cancer Neg Hx   . Rectal cancer Neg Hx   . Stomach cancer Neg Hx     SOCIAL HISTORY: Social History   Tobacco Use  . Smoking status: Former Smoker    Last attempt to quit: 09/08/1972    Years since quitting: 46.2  . Smokeless tobacco: Former Systems developer    Types: Chew  Substance Use Topics  . Alcohol use: Not Currently    Alcohol/week: 5.0 standard drinks    Types: 5 Cans of beer per week    Allergies  Allergen Reactions  . Contrast Media [Iodinated Diagnostic Agents] Other (See Comments)    Break out in welts  . Statins Other (See Comments)    Legs pain: Atorvastatin and Lovastatin    Current Outpatient Medications  Medication Sig Dispense Refill  . allopurinol (ZYLOPRIM) 100 MG tablet Take 2 tablets (200 mg total) by mouth daily. 180 tablet 6  . clopidogrel (PLAVIX) 75 MG tablet TAKE 1 TABLET BY MOUTH  DAILY (Patient taking differently: Take 75 mg by mouth daily. ) 90 tablet 2  . cyanocobalamin 1000 MCG tablet Take 1,000 mcg by mouth daily.     [name] lisinopril-hydrochlorothiazide (PRINZIDE,ZESTORETIC) 20-25 MG tablet TAKE 1 TABLET BY MOUTH  DAILY 90 tablet 2  . mesalamine (APRISO) 0.375 g 24 hr capsule Take 4 capsules (1.5 g total) by mouth daily. Take 4 tablets all at one time. 360 capsule 3  . pantoprazole (PROTONIX) 40 MG tablet Take 1 tablet (40 mg total) by mouth daily. 90 tablet 3  . predniSONE (DELTASONE) 10 MG tablet Take 30 mg (3 tablets) daily x 2  weeks, then reduce to 20 mg (2 tablets)daily x 2 weeks, then reduce to 10 mg (1 tablet) daily x 2 weeks then reduce to 10 mg (1 tablet) every other day x 2 weeks then stop. 100 tablet 0  . zolpidem (AMBIEN) 10 MG tablet TAKE 1 TABLET (10 MG TOTAL) BY MOUTH AT BEDTIME AS NEEDED FOR SLEEP. 30 tablet 5   No current facility-administered medications for this visit.     REVIEW OF SYSTEMS:  [X]  denotes positive finding, [ ]  denotes negative finding Cardiac  Comments:  Chest pain or chest pressure:    Shortness of breath upon exertion:    Short of breath when lying flat:    Irregular heart rhythm:        Vascular    Pain in calf, thigh, or hip brought on by ambulation:    Pain in feet at night that wakes you up from your sleep:     Blood clot in your veins:    Leg swelling:  Pulmonary    Oxygen at home:    Productive cough:     Wheezing:         Neurologic    Sudden weakness in arms or legs:     Sudden numbness in arms or legs:     Sudden onset of difficulty speaking or slurred speech:    Temporary loss of vision in one eye:     Problems with dizziness:         Gastrointestinal    Blood in stool:     Vomited blood:         Genitourinary    Burning when urinating:     Blood in urine:        Psychiatric    Major depression:         Hematologic    Bleeding problems:    Problems with blood clotting too easily:        Skin    Rashes or ulcers:        Constitutional    Fever or chills:      PHYSICAL EXAM: Vitals:   11/09/18 1443  BP: 128/68  Pulse: 75  Resp: 20  Temp: (!) 97.5 F (36.4 C)  TempSrc: Oral  SpO2: 98%  Weight: 230 lb (104.3 kg)    GENERAL: The patient is a well-nourished male, in no acute distress. The vital signs are documented above. CARDIAC: There is a regular rate and rhythm.  VASCULAR:  Right vertical groin incision healed Palpable bilateral femoral pulses Biphasic PT signals bilaterally  PULMONARY: There is good air exchange  bilaterally without wheezing or rales. ABDOMEN: Soft and non-tender with normal pitched bowel sounds.  NEUROLOGIC: No focal weakness or paresthesias are detected. SKIN: There are no ulcers or rashes noted. PSYCHIATRIC: The patient has a normal affect.  DATA:   ABI's 0.79 right lower extremity (from 0.47) and 0.74 left lower extremity (from 0.68)  Assessment/Plan:  Overall seems to be doing well after her right common femoral endarterectomy with profundoplasty and bovine pericardial patch on 07/19/2018 and left external iliac stent on 05/13/18 for intermittent lower extremity claudication.  Now walking much further distances and happy with his progress.  Will continue to monitor slightly increased velocity proximal to left SFA stent.  Will see him back in 6 months for ongoing surveilance with ABI's and lower extremity arterial duplex.   [name], MD [facility] [place] Office: [phone] Pager: [place]

## 2018-11-18 ENCOUNTER — Other Ambulatory Visit: Payer: Self-pay

## 2018-11-18 ENCOUNTER — Ambulatory Visit (INDEPENDENT_AMBULATORY_CARE_PROVIDER_SITE_OTHER): Payer: Medicare Other | Admitting: Internal Medicine

## 2018-11-18 ENCOUNTER — Encounter: Payer: Self-pay | Admitting: Internal Medicine

## 2018-11-18 ENCOUNTER — Ambulatory Visit: Payer: Self-pay

## 2018-11-18 VITALS — BP 124/72 | HR 89 | Temp 98.2°F | Ht 68.0 in | Wt 224.0 lb

## 2018-11-18 DIAGNOSIS — J019 Acute sinusitis, unspecified: Secondary | ICD-10-CM | POA: Diagnosis not present

## 2018-11-18 DIAGNOSIS — R7302 Impaired glucose tolerance (oral): Secondary | ICD-10-CM | POA: Diagnosis not present

## 2018-11-18 DIAGNOSIS — R062 Wheezing: Secondary | ICD-10-CM | POA: Insufficient documentation

## 2018-11-18 MED ORDER — METHYLPREDNISOLONE ACETATE 80 MG/ML IJ SUSP
80.0000 mg | Freq: Once | INTRAMUSCULAR | Status: AC
  Administered 2018-11-18: 80 mg via INTRAMUSCULAR

## 2018-11-18 MED ORDER — LEVOFLOXACIN 500 MG PO TABS: 500.0000 mg | ORAL_TABLET | Freq: Every day | ORAL | 0 refills | Status: DC

## 2018-11-18 MED ORDER — PREDNISONE 10 MG PO TABS: ORAL_TABLET | ORAL | 0 refills | Status: DC

## 2018-11-18 MED ORDER — HYDROCODONE-HOMATROPINE 5-1.5 MG/5ML PO SYRP: 5.0000 mL | ORAL_SOLUTION | Freq: Four times a day (QID) | ORAL | 0 refills | Status: AC | PRN

## 2018-11-18 NOTE — Telephone Encounter (Signed)
Noted.  Dr. John FYI 

## 2018-11-18 NOTE — Telephone Encounter (Signed)
Pt called stating that he has has cold symptoms that he feels have turned into sinus infection. He says his eye and face are full.  He is wheezing today had feels SOB. Her has no fever and an occasional cough the produces white thick phlegm. Appointment scheduled per protocol. Care advice read to patient. Pt verbalized understanding of all instructions. Reason for Disposition . [1] MILD difficulty breathing (e.g., minimal/no SOB at rest, SOB with walking, pulse <100) AND [2] NEW-onset or WORSE than normal  Answer Assessment - Initial Assessment Questions 1. RESPIRATORY STATUS: "Describe your breathing?" (e.g., wheezing, shortness of breath, unable to speak, severe coughing)      wheezing 2. ONSET: "When did this breathing problem begin?"      Last week cold symptoms 3. PATTERN "Does the difficult breathing come and go, or has it been constant since it started?"      With activity 4. SEVERITY: "How bad is your breathing?" (e.g., mild, moderate, severe)    - MILD: No SOB at rest, mild SOB with walking, speaks normally in sentences, can lay down, no retractions, pulse < 100.    - MODERATE: SOB at rest, SOB with minimal exertion and prefers to sit, cannot lie down flat, speaks in phrases, mild retractions, audible wheezing, pulse 100-120.    - SEVERE: Very SOB at rest, speaks in single words, struggling to breathe, sitting hunched forward, retractions, pulse > 120      moderate 5. RECURRENT SYMPTOM: "Have you had difficulty breathing before?" If so, ask: "When was the last time?" and "What happened that time?"      no 6. CARDIAC HISTORY: "Do you have any history of heart disease?" (e.g., heart attack, angina, bypass surgery, angioplasty)      no 7. LUNG HISTORY: "Do you have any history of lung disease?"  (e.g., pulmonary embolus, asthma, emphysema)     no 8. CAUSE: "What do you think is causing the breathing problem?"      Sinus infection 9. OTHER SYMPTOMS: "Do you have any other symptoms?  (e.g., dizziness, runny nose, cough, chest pain, fever)     Runny nose,ocassional cough 10. PREGNANCY: "Is there any chance you are pregnant?" "When was your last menstrual period?"       N/A 11. TRAVEL: "Have you traveled out of the country in the last month?" (e.g., travel history, exposures)      No  Protocols used: BREATHING DIFFICULTY-A-AH

## 2018-11-18 NOTE — Assessment & Plan Note (Signed)
stable overall by history and exam, recent data reviewed with pt, and pt to continue medical treatment as before,  to f/u any worsening symptoms or concerns  

## 2018-11-18 NOTE — Progress Notes (Signed)
Subjective:    Patient ID: [name] [name], male    DOB: 07-19-1941, 78 y.o.   MRN: [id number]  HPI   Here with 2-3 days acute onset fever, facial pain, pressure, headache, general weakness and malaise, and greenish d/c, with mild ST and cough, but pt denies chest pain, wheezing, increased sob or doe, orthopnea, PND, increased LE swelling, palpitations, dizziness or syncop, except for onset mild wheezing and sob since last PM.  Pt denies new neurological symptoms such as new headache, or facial or extremity weakness or numbness   Pt denies polydipsia, polyuria Past Medical History:  Diagnosis Date  . Abdominal pain, unspecified site 10/16/2009  . Anal fissure 04/24/2008  . Arthritis   . B12 deficiency 10/11/2014  . BACK PAIN 01/10/2009  . Barrett's esophagus 1997  . BARRETTS ESOPHAGUS 08/16/2007  . BREAST HYPERTROPHY 02/16/2009  . CARBUNCLE, NECK 02/14/2008  . Cataract   . DIVERTICULOSIS, COLON 06/28/2007  . Full dentures   . GERD 06/28/2007  . GLUCOSE INTOLERANCE 08/15/2008  . Headache(784.0) 11/13/2009  . HERNIA, UMBILICAL W/OBSTRUCTION W/O GANGRENE 06/28/2007  . HTN (hypertension) 07/12/2012  . HYPERLIPIDEMIA 06/28/2007  . HYPERTENSION 06/28/2007  . Impaired glucose tolerance 04/13/2011  . INSOMNIA-SLEEP DISORDER-UNSPEC 10/10/2008  . LEG PAIN, BILATERAL 02/16/2009  . LOW BACK PAIN 06/28/2007  . MASTITIS 01/10/2009  . NECK PAIN 05/31/2008  . NEPHROLITHIASIS, HX OF 06/28/2007  . PROCTOSIGMOIDITIS, ULCERATIVE 1997  . PVD (peripheral vascular disease) with claudication, lifestyle limiting, ABI rt. 0.67, lt. 0.73 07/12/2012  . S/P angioplasty with stent, after DB athrectomy to Rt. ext. iliac and Rt. SFA 07/12/2012  . Skin cancer    right hand  . Spinal stenosis at L4-L5 level   . Stenosis of left carotid artery, mod to severe by dopplers 09/29/2012  . TESTICULAR PAIN, RIGHT 10/25/2010  . VITAMIN D DEFICIENCY 11/13/2009   Past Surgical History:  Procedure Laterality Date  . ABDOMINAL AORTOGRAM N/A  05/13/2018   Procedure: ABDOMINAL AORTOGRAM;  Surgeon: [name], MD;  Location: [place] [facility];  Service: Cardiovascular;  Laterality: N/A;  . anal fissure surgury     pt unsure of this  . ATHERECTOMY  09/28/2012  . ATHERECTOMY N/A 07/12/2012   Procedure: ATHERECTOMY;  Surgeon: [name], MD;  Location: [facility] CATH LAB;  Service: Cardiovascular;  Laterality: N/A;  . ATHERECTOMY N/A 09/28/2012   Procedure: ATHERECTOMY;  Surgeon: [name], MD;  Location: [facility] CATH LAB;  Service: Cardiovascular;  Laterality: N/A;  . bilateral inguinal hernia with mesh and umbilical hernia repairs  09/2007  . CATARACT EXTRACTION Bilateral    bilateral  . COLONOSCOPY    . ENDARTERECTOMY FEMORAL Right 07/19/2018   Procedure: ENDARTERECTOMY RIGHT FEMORAL ARTERY;  Surgeon: [name], MD;  Location: [place];  Service: Vascular;  Laterality: Right;  . GROIN DISSECTION Right 07/20/2013   Procedure: GROIN EXPLORATION, EXCISIONAL BIOPSY RIGHT INGUINAL LYMPH NODES;  Surgeon: Harl Bowie, MD;  Location: [place];  Service: General;  Laterality: Right;  . HERNIA REPAIR    . left knee arthroscopy    . LOWER EXTREMITY ANGIOGRAPHY Bilateral 05/13/2018   Procedure: Lower Extremity Angiography;  Surgeon: [name], MD;  Location: [place] [facility];  Service: Cardiovascular;  Laterality: Bilateral;  . LUMBAR LAMINECTOMY/DECOMPRESSION MICRODISCECTOMY N/A 11/05/2016   Procedure: Microlumbar decompression L2-3, L3-4, L4-5, lateral mass fusion L4-5;  Surgeon: Susa Day, MD;  Location: [facility];  Service: Orthopedics;  Laterality: N/A;  . PATCH  ANGIOPLASTY Right 07/19/2018   Procedure: RIGHT FEMORAL PATCH ANGIOPLASTY WITH XENOSURE BIOLOGIC PATCH;  Surgeon: [name], MD;  Location: [place];  Service: Vascular;  Laterality: Right;  . PERCUTANEOUS STENT INTERVENTION  09/28/2012   Procedure: PERCUTANEOUS STENT INTERVENTION;  Surgeon: [name], MD;  Location:  [facility] CATH LAB;  Service: Cardiovascular;;  . PERIPHERAL VASCULAR INTERVENTION  05/13/2018   Procedure: PERIPHERAL VASCULAR INTERVENTION;  Surgeon: [name], MD;  Location: [place] [facility];  Service: Cardiovascular;;  Ext. Iliac  . UPPER GI ENDOSCOPY      reports that he quit smoking about 46 years ago. He has quit using smokeless tobacco.  His smokeless tobacco use included chew. He reports previous alcohol use of about 5.0 standard drinks of alcohol per week. He reports that he does not use drugs. family history includes COPD in his father and mother; Cerebral aneurysm in his mother. Allergies  Allergen Reactions  . Contrast Media [Iodinated Diagnostic Agents] Other (See Comments)    Break out in welts  . Statins Other (See Comments)    Legs pain: Atorvastatin and Lovastatin   Current Outpatient Medications on File Prior to Visit  Medication Sig Dispense Refill  . allopurinol (ZYLOPRIM) 100 MG tablet Take 2 tablets (200 mg total) by mouth daily. 180 tablet 6  . clopidogrel (PLAVIX) 75 MG tablet TAKE 1 TABLET BY MOUTH  DAILY (Patient taking differently: Take 75 mg by mouth daily. ) 90 tablet 2  . cyanocobalamin 1000 MCG tablet Take 1,000 mcg by mouth daily.     [name] lisinopril-hydrochlorothiazide (PRINZIDE,ZESTORETIC) 20-25 MG tablet TAKE 1 TABLET BY MOUTH  DAILY 90 tablet 2  . mesalamine (APRISO) 0.375 g 24 hr capsule Take 4 capsules (1.5 g total) by mouth daily. Take 4 tablets all at one time. 360 capsule 3  . pantoprazole (PROTONIX) 40 MG tablet Take 1 tablet (40 mg total) by mouth daily. 90 tablet 3  . zolpidem (AMBIEN) 10 MG tablet TAKE 1 TABLET (10 MG TOTAL) BY MOUTH AT BEDTIME AS NEEDED FOR SLEEP. 30 tablet 5   No current facility-administered medications on file prior to visit.    Review of Systems  Constitutional: Negative for other unusual diaphoresis or sweats HENT: Negative for ear discharge or swelling Eyes: Negative for other worsening visual disturbances  Respiratory: Negative for stridor or other swelling  Gastrointestinal: Negative for worsening distension or other blood Genitourinary: Negative for retention or other urinary change Musculoskeletal: Negative for other MSK pain or swelling Skin: Negative for color change or other new lesions Neurological: Negative for worsening tremors and other numbness  Psychiatric/Behavioral: Negative for worsening agitation or other fatigue All other system neg per pt    Objective:   Physical Exam BP 124/72   Pulse 89   Temp 98.2 F (36.8 C) (Oral)   Ht 5' 8"  (1.727 m)   Wt 224 lb (101.6 kg)   SpO2 96%   BMI 34.06 kg/m  VS noted, mild ill Constitutional: Pt appears in NAD HENT: Head: NCAT.  Right Ear: External ear normal.  Left Ear: External ear normal.  Eyes: . Pupils are equal, round, and reactive to light. Conjunctivae and EOM are normal Bilat tm's with mild erythema.  Max sinus areas mild tender.  Pharynx with mild erythema, no exudate Nose: without d/c or deformity Neck: Neck supple. Gross normal ROM Cardiovascular: Normal rate and regular rhythm.   Pulmonary/Chest: Effort normal and breath sounds decreased without rales but with mild bilat wheezing.  Neurological:  Pt is alert. At baseline orientation, motor grossly intact Skin: Skin is warm. No rashes, other new lesions, no LE edema Psychiatric: Pt behavior is normal without agitation  No other exam findings Lab Results  Component Value Date   WBC 12.1 (H) 11/02/2018   HGB 15.5 11/02/2018   HCT 46.8 11/02/2018   PLT 245.0 11/02/2018   GLUCOSE 148 (H) 11/02/2018   CHOL 168 12/28/2017   TRIG 119.0 12/28/2017   HDL 32.00 (L) 12/28/2017   LDLDIRECT 184.7 10/14/2013   LDLCALC 112 (H) 12/28/2017   ALT 19 11/02/2018   AST 14 11/02/2018   NA 134 (L) 11/02/2018   K 4.3 11/02/2018   CL 100 11/02/2018   CREATININE 1.05 11/02/2018   BUN 22 11/02/2018   CO2 25 11/02/2018   TSH 0.60 11/02/2018   PSA 1.45 12/28/2017   INR 1.06  07/12/2018   HGBA1C 5.9 12/28/2017   MICROALBUR 1.4 10/14/2010       Assessment & Plan:

## 2018-11-18 NOTE — Assessment & Plan Note (Signed)
Mild to mod, for depomedrol IM 80, predpac asd, to f/u any worsening symptoms or concerns

## 2018-11-18 NOTE — Assessment & Plan Note (Signed)
Mild to mod, for antibx course,  to f/u any worsening symptoms or concerns 

## 2018-11-18 NOTE — Patient Instructions (Signed)
You had the steroid shot today  Please take all new medication as prescribed - the antibiotic, cough medicine, and prednisone  Please continue all other medications as before, and refills have been done if requested.  Please have the pharmacy call with any other refills you may need.  Please keep your appointments with your specialists as you may have planned   

## 2018-11-24 ENCOUNTER — Telehealth: Payer: Self-pay | Admitting: Internal Medicine

## 2018-11-24 MED ORDER — DOXYCYCLINE HYCLATE 100 MG PO TABS: 100.0000 mg | ORAL_TABLET | Freq: Two times a day (BID) | ORAL | 0 refills | Status: DC

## 2018-11-24 NOTE — Telephone Encounter (Signed)
Copied from [place]. Topic: General - Other >> Nov 24, 2018  2:50 PM Keene Breath wrote: Reason for CRM: Patient's daughter called to inform the doctor that the medication he prescribed is not working and the patient is not getting any better.  He has been taking it since 11/18/18.  Please advise and call back to let patient know what he should do or if there is another medication that he can take.  CB# [phone]

## 2018-11-24 NOTE — Telephone Encounter (Signed)
[place] for change to doxycycline - done erx

## 2018-11-24 NOTE — Addendum Note (Signed)
Addended by: Biagio Borg on: 11/24/2018 05:22 PM   Modules accepted: Orders

## 2018-11-25 ENCOUNTER — Other Ambulatory Visit: Payer: Self-pay | Admitting: Internal Medicine

## 2018-11-25 NOTE — Telephone Encounter (Signed)
Pt has been informed.

## 2018-12-10 ENCOUNTER — Ambulatory Visit: Payer: Medicare Other | Admitting: Gastroenterology

## 2019-01-04 ENCOUNTER — Encounter: Payer: Medicare Other | Admitting: Internal Medicine

## 2019-01-06 ENCOUNTER — Ambulatory Visit: Payer: Medicare Other | Admitting: Gastroenterology

## 2019-01-28 ENCOUNTER — Ambulatory Visit (INDEPENDENT_AMBULATORY_CARE_PROVIDER_SITE_OTHER): Payer: Medicare Other | Admitting: Internal Medicine

## 2019-01-28 ENCOUNTER — Other Ambulatory Visit: Payer: Self-pay

## 2019-01-28 ENCOUNTER — Encounter: Payer: Self-pay | Admitting: Internal Medicine

## 2019-01-28 VITALS — BP 118/64 | HR 94 | Temp 98.3°F | Ht 68.0 in | Wt 234.0 lb

## 2019-01-28 DIAGNOSIS — Z23 Encounter for immunization: Secondary | ICD-10-CM

## 2019-01-28 DIAGNOSIS — E611 Iron deficiency: Secondary | ICD-10-CM | POA: Diagnosis not present

## 2019-01-28 DIAGNOSIS — E538 Deficiency of other specified B group vitamins: Secondary | ICD-10-CM | POA: Diagnosis not present

## 2019-01-28 DIAGNOSIS — E559 Vitamin D deficiency, unspecified: Secondary | ICD-10-CM

## 2019-01-28 DIAGNOSIS — Z0001 Encounter for general adult medical examination with abnormal findings: Secondary | ICD-10-CM

## 2019-01-28 DIAGNOSIS — R0600 Dyspnea, unspecified: Secondary | ICD-10-CM | POA: Diagnosis not present

## 2019-01-28 DIAGNOSIS — R7302 Impaired glucose tolerance (oral): Secondary | ICD-10-CM | POA: Diagnosis not present

## 2019-01-28 DIAGNOSIS — I10 Essential (primary) hypertension: Secondary | ICD-10-CM | POA: Diagnosis not present

## 2019-01-28 MED ORDER — ALBUTEROL SULFATE HFA 108 (90 BASE) MCG/ACT IN AERS: 2.0000 | INHALATION_SPRAY | Freq: Four times a day (QID) | RESPIRATORY_TRACT | 3 refills | Status: AC | PRN

## 2019-01-28 MED ORDER — BUDESONIDE-FORMOTEROL FUMARATE 160-4.5 MCG/ACT IN AERO: 2.0000 | INHALATION_SPRAY | Freq: Two times a day (BID) | RESPIRATORY_TRACT | 11 refills | Status: AC

## 2019-01-28 NOTE — Patient Instructions (Addendum)
You had the Tdap shot today  .Please take all new medication as prescribed - the Albuterol inhaler as needed, and the Symbicort inhaler as prescribed every day  Please continue all other medications as before, and refills have been done if requested.  Please have the pharmacy call with any other refills you may need.  Please continue your efforts at being more active, low cholesterol diet, and weight control.  You are otherwise up to date with prevention measures today.  Please keep your appointments with your specialists as you may have planned  Please go to the XRAY Department in the Basement (go straight as you get off the elevator) for the x-ray testing  Please go to the LAB in the Basement (turn left off the elevator) for the tests to be done today  You will be contacted by phone if any changes need to be made immediately.  Otherwise, you will receive a letter about your results with an explanation, but please check with MyChart first.  Please remember to sign up for MyChart if you have not done so, as this will be important to you in the future with finding out test results, communicating by private email, and scheduling acute appointments online when needed.  Please return in 6 months, or sooner if needed, with Lab testing done 3-5 days before

## 2019-01-28 NOTE — Assessment & Plan Note (Signed)
For f/u b12 today

## 2019-01-28 NOTE — Progress Notes (Signed)
Subjective:    Patient ID: [name] [name], male    DOB: Aug 29, 1941, 78 y.o.   MRN: [id number]  HPI  Here for wellness and f/u;  Overall doing ok;  [name]  Pt denies neurological change such as new headache, facial or extremity weakness.  Pt denies polydipsia, polyuria, or low sugar symptoms. Pt states overall good compliance with treatment and medications, good tolerability, and has been trying to follow appropriate diet.  Pt denies worsening depressive symptoms, suicidal ideation or panic. No fever, night sweats, wt loss, loss of appetite, or other constitutional symptoms.  Pt states good ability with ADL's, has low fall risk, home safety reviewed and adequate, no other significant changes in hearing or vision, and only occasionally active with exercise. Also has c/o 2-3 wks onset dyspnea, mild worse with exertion, maybe associated with recent wt gain and less active overall. Pt denies chest pain, wheezing, orthopnea, PND, increased LE swelling, palpitations, dizziness or syncope.  Past Medical History:  Diagnosis Date  . Abdominal pain, unspecified site 10/16/2009  . Anal fissure 04/24/2008  . Arthritis   . B12 deficiency 10/11/2014  . BACK PAIN 01/10/2009  . Barrett's esophagus 1997  . BARRETTS ESOPHAGUS 08/16/2007  . BREAST HYPERTROPHY 02/16/2009  . CARBUNCLE, NECK 02/14/2008  . Cataract   . DIVERTICULOSIS, COLON 06/28/2007  . Full dentures   . GERD 06/28/2007  . GLUCOSE INTOLERANCE 08/15/2008  . Headache(784.0) 11/13/2009  . HERNIA, UMBILICAL W/OBSTRUCTION W/O GANGRENE 06/28/2007  . HTN (hypertension) 07/12/2012  . HYPERLIPIDEMIA 06/28/2007  . HYPERTENSION 06/28/2007  . Impaired glucose tolerance 04/13/2011  . INSOMNIA-SLEEP DISORDER-UNSPEC 10/10/2008  . LEG PAIN, BILATERAL 02/16/2009  . LOW BACK PAIN 06/28/2007  . MASTITIS 01/10/2009  . NECK PAIN 05/31/2008  . NEPHROLITHIASIS, HX OF 06/28/2007  . PROCTOSIGMOIDITIS, ULCERATIVE 1997  . PVD (peripheral vascular disease) with claudication, lifestyle  limiting, ABI rt. 0.67, lt. 0.73 07/12/2012  . S/P angioplasty with stent, after DB athrectomy to Rt. ext. iliac and Rt. SFA 07/12/2012  . Skin cancer    right hand  . Spinal stenosis at L4-L5 level   . Stenosis of left carotid artery, mod to severe by dopplers 09/29/2012  . TESTICULAR PAIN, RIGHT 10/25/2010  . VITAMIN D DEFICIENCY 11/13/2009   Past Surgical History:  Procedure Laterality Date  . ABDOMINAL AORTOGRAM N/A 05/13/2018   Procedure: ABDOMINAL AORTOGRAM;  Surgeon: [name], MD;  Location: [place] [facility];  Service: Cardiovascular;  Laterality: N/A;  . anal fissure surgury     pt unsure of this  . ATHERECTOMY  09/28/2012  . ATHERECTOMY N/A 07/12/2012   Procedure: ATHERECTOMY;  Surgeon: [name], MD;  Location: [facility] CATH LAB;  Service: Cardiovascular;  Laterality: N/A;  . ATHERECTOMY N/A 09/28/2012   Procedure: ATHERECTOMY;  Surgeon: [name], MD;  Location: [facility] CATH LAB;  Service: Cardiovascular;  Laterality: N/A;  . bilateral inguinal hernia with mesh and umbilical hernia repairs  09/2007  . CATARACT EXTRACTION Bilateral    bilateral  . COLONOSCOPY    . ENDARTERECTOMY FEMORAL Right 07/19/2018   Procedure: ENDARTERECTOMY RIGHT FEMORAL ARTERY;  Surgeon: [name], MD;  Location: [place];  Service: Vascular;  Laterality: Right;  . GROIN DISSECTION Right 07/20/2013   Procedure: GROIN EXPLORATION, EXCISIONAL BIOPSY RIGHT INGUINAL LYMPH NODES;  Surgeon: Harl Bowie, MD;  Location: [place];  Service: General;  Laterality: Right;  . HERNIA REPAIR    . left knee arthroscopy    . LOWER  EXTREMITY ANGIOGRAPHY Bilateral 05/13/2018   Procedure: Lower Extremity Angiography;  Surgeon: [name], MD;  Location: [place] [facility];  Service: Cardiovascular;  Laterality: Bilateral;  . LUMBAR LAMINECTOMY/DECOMPRESSION MICRODISCECTOMY N/A 11/05/2016   Procedure: Microlumbar decompression L2-3, L3-4, L4-5, lateral mass fusion L4-5;   Surgeon: Susa Day, MD;  Location: [facility];  Service: Orthopedics;  Laterality: N/A;  . PATCH ANGIOPLASTY Right 07/19/2018   Procedure: RIGHT FEMORAL PATCH ANGIOPLASTY WITH XENOSURE BIOLOGIC PATCH;  Surgeon: [name], MD;  Location: [place];  Service: Vascular;  Laterality: Right;  . PERCUTANEOUS STENT INTERVENTION  09/28/2012   Procedure: PERCUTANEOUS STENT INTERVENTION;  Surgeon: [name], MD;  Location[facility] CATH LAB;  Service: Cardiovascular;;  . PERIPHERAL VASCULAR INTERVENTION  05/13/2018   Procedure: PERIPHERAL VASCULAR INTERVENTION;  Surgeon: [name], MD;  Location: [place] [facility];  Service: Cardiovascular;;  Ext. Iliac  . UPPER GI ENDOSCOPY      reports that he quit smoking about 46 years ago. He has quit using smokeless tobacco.  His smokeless tobacco use included chew. He reports previous alcohol use of about 5.0 standard drinks of alcohol per week. He reports that he does not use drugs. family history includes COPD in his father and mother; Cerebral aneurysm in his mother. Allergies  Allergen Reactions  . Contrast Media [Iodinated Diagnostic Agents] Other (See Comments)    Break out in welts  . Statins Other (See Comments)    Legs pain: Atorvastatin and Lovastatin   Current Outpatient Medications on File Prior to Visit  Medication Sig Dispense Refill  . allopurinol (ZYLOPRIM) 100 MG tablet Take 2 tablets (200 mg total) by mouth daily. 180 tablet 6  . clopidogrel (PLAVIX) 75 MG tablet TAKE 1 TABLET BY MOUTH  DAILY 90 tablet 2  . cyanocobalamin 1000 MCG tablet Take 1,000 mcg by mouth daily.     [name] doxycycline (VIBRA-TABS) 100 MG tablet Take 1 tablet (100 mg total) by mouth 2 (two) times daily. 20 tablet 0  . lisinopril-hydrochlorothiazide (PRINZIDE,ZESTORETIC) 20-25 MG tablet TAKE 1 TABLET BY MOUTH  DAILY 90 tablet 2  . mesalamine (APRISO) 0.375 g 24 hr capsule Take 4 capsules (1.5 g total) by mouth daily. Take 4 tablets all at one time. 360 capsule  3  . pantoprazole (PROTONIX) 40 MG tablet Take 1 tablet (40 mg total) by mouth daily. 90 tablet 3  . predniSONE (DELTASONE) 10 MG tablet 3 tabs by mouth per day for 3 days,2tabs per day for 3 days,1tab per day for 3 days 18 tablet 0  . zolpidem (AMBIEN) 10 MG tablet TAKE 1 TABLET (10 MG TOTAL) BY MOUTH AT BEDTIME AS NEEDED FOR SLEEP. 30 tablet 5   No current facility-administered medications on file prior to visit.    Review of Systems Constitutional: Negative for other unusual diaphoresis, sweats, appetite or weight changes HENT: Negative for other worsening hearing loss, ear pain, facial swelling, mouth sores or neck stiffness.   Eyes: Negative for other worsening pain, redness or other visual disturbance.  Respiratory: Negative for other stridor or swelling Cardiovascular: Negative for other palpitations or other chest pain  Gastrointestinal: Negative for worsening diarrhea or loose stools, blood in stool, distention or other pain Genitourinary: Negative for hematuria, flank pain or other change in urine volume.  Musculoskeletal: Negative for myalgias or other joint swelling.  Skin: Negative for other color change, or other wound or worsening drainage.  Neurological: Negative for other syncope or numbness. Hematological: Negative for other adenopathy  or swelling Psychiatric/Behavioral: Negative for hallucinations, other worsening agitation, SI, self-injury, or new decreased concentration All other system neg per pt    Objective:   Physical Exam BP 118/64   Pulse 94   Temp 98.3 F (36.8 C) (Oral)   Ht 5' 8"  (1.727 m)   Wt 234 lb (106.1 kg)   SpO2 98%   BMI 35.58 kg/m  VS noted,  Constitutional: Pt is oriented to person, place, and time. Appears well-developed and well-nourished, in no significant distress and comfortable Head: Normocephalic and atraumatic  Eyes: Conjunctivae and EOM are normal. Pupils are equal, round, and reactive to light Right Ear: External ear normal  without discharge Left Ear: External ear normal without discharge Nose: Nose without discharge or deformity Mouth/Throat: Oropharynx is without other ulcerations and moist  Neck: Normal range of motion. Neck supple. No JVD present. No tracheal deviation present or significant neck LA or mass Cardiovascular: Normal rate, regular rhythm, normal heart sounds and intact distal pulses.   Pulmonary/Chest: WOB normal and breath sounds decreased without rales or wheezing  Abdominal: Soft. Bowel sounds are normal. NT. No HSM  Musculoskeletal: Normal range of motion. Exhibits no edema Lymphadenopathy: Has no other cervical adenopathy.  Neurological: Pt is alert and oriented to person, place, and time. Pt has normal reflexes. No cranial nerve deficit. Motor grossly intact, Gait intact Skin: Skin is warm and dry. No rash noted or new ulcerations Psychiatric:  Has normal mood and affect. Behavior is normal without agitation No other exam findings Lab Results  Component Value Date   WBC 12.1 (H) 11/02/2018   HGB 15.5 11/02/2018   HCT 46.8 11/02/2018   PLT 245.0 11/02/2018   GLUCOSE 148 (H) 11/02/2018   CHOL 168 12/28/2017   TRIG 119.0 12/28/2017   HDL 32.00 (L) 12/28/2017   LDLDIRECT 184.7 10/14/2013   LDLCALC 112 (H) 12/28/2017   ALT 19 11/02/2018   AST 14 11/02/2018   NA 134 (L) 11/02/2018   K 4.3 11/02/2018   CL 100 11/02/2018   CREATININE 1.05 11/02/2018   BUN 22 11/02/2018   CO2 25 11/02/2018   TSH 0.60 11/02/2018   PSA 1.45 12/28/2017   INR 1.06 07/12/2018   HGBA1C 5.9 12/28/2017   MICROALBUR 1.4 10/14/2010      Assessment & Plan:

## 2019-01-28 NOTE — Assessment & Plan Note (Signed)
stable overall by history and exam, recent data reviewed with pt, and pt to continue medical treatment as before,  to f/u any worsening symptoms or concerns  

## 2019-01-28 NOTE — Assessment & Plan Note (Signed)

## 2019-01-28 NOTE — Assessment & Plan Note (Addendum)
Etiology unclerar, ? Underlying copd, ok for albuterol hfa prn, symbicort asd trial, BNP, cxr, and consider echo  In addition to the time spent performing CPE, I spent an additional 25 minutes face to face,in which greater than 50% of this time was spent in counseling and coordination of care for patient's acute illness as documented, including the differential dx, treatment, further evaluation and other management of dyspnea, b12 deficiency, HTN, hyperglycemia

## 2019-02-07 ENCOUNTER — Encounter: Payer: Self-pay | Admitting: Internal Medicine

## 2019-02-07 ENCOUNTER — Telehealth: Payer: Self-pay

## 2019-02-07 ENCOUNTER — Other Ambulatory Visit (INDEPENDENT_AMBULATORY_CARE_PROVIDER_SITE_OTHER): Payer: Medicare Other

## 2019-02-07 DIAGNOSIS — E538 Deficiency of other specified B group vitamins: Secondary | ICD-10-CM | POA: Diagnosis not present

## 2019-02-07 DIAGNOSIS — Z0001 Encounter for general adult medical examination with abnormal findings: Secondary | ICD-10-CM | POA: Diagnosis not present

## 2019-02-07 DIAGNOSIS — E559 Vitamin D deficiency, unspecified: Secondary | ICD-10-CM | POA: Diagnosis not present

## 2019-02-07 DIAGNOSIS — R0600 Dyspnea, unspecified: Secondary | ICD-10-CM | POA: Diagnosis not present

## 2019-02-07 DIAGNOSIS — E611 Iron deficiency: Secondary | ICD-10-CM

## 2019-02-07 DIAGNOSIS — R7302 Impaired glucose tolerance (oral): Secondary | ICD-10-CM

## 2019-02-07 LAB — IBC PANEL
Iron: 70 ug/dL (ref 42–165)
Saturation Ratios: 18.7 % — ABNORMAL LOW (ref 20.0–50.0)
Transferrin: 267 mg/dL (ref 212.0–360.0)

## 2019-02-07 LAB — URINALYSIS, ROUTINE W REFLEX MICROSCOPIC
Bilirubin Urine: NEGATIVE
Hgb urine dipstick: NEGATIVE
Ketones, ur: NEGATIVE
Leukocytes,Ua: NEGATIVE
Nitrite: NEGATIVE
RBC / HPF: NONE SEEN (ref 0–?)
Specific Gravity, Urine: 1.02 (ref 1.000–1.030)
Total Protein, Urine: NEGATIVE
Urine Glucose: NEGATIVE
Urobilinogen, UA: 0.2 (ref 0.0–1.0)
pH: 6 (ref 5.0–8.0)

## 2019-02-07 LAB — HEPATIC FUNCTION PANEL
ALT: 12 U/L (ref 0–53)
AST: 12 U/L (ref 0–37)
Albumin: 4 g/dL (ref 3.5–5.2)
Alkaline Phosphatase: 71 U/L (ref 39–117)
Bilirubin, Direct: 0.2 mg/dL (ref 0.0–0.3)
Total Bilirubin: 0.8 mg/dL (ref 0.2–1.2)
Total Protein: 6.5 g/dL (ref 6.0–8.3)

## 2019-02-07 LAB — CBC WITH DIFFERENTIAL/PLATELET
Basophils Absolute: 0.1 10*3/uL (ref 0.0–0.1)
Basophils Relative: 1.2 % (ref 0.0–3.0)
Eosinophils Absolute: 0.3 10*3/uL (ref 0.0–0.7)
Eosinophils Relative: 3.9 % (ref 0.0–5.0)
HCT: 45.3 % (ref 39.0–52.0)
Hemoglobin: 15.4 g/dL (ref 13.0–17.0)
Lymphocytes Relative: 10.2 % — ABNORMAL LOW (ref 12.0–46.0)
Lymphs Abs: 0.8 10*3/uL (ref 0.7–4.0)
MCHC: 34 g/dL (ref 30.0–36.0)
MCV: 89 fl (ref 78.0–100.0)
Monocytes Absolute: 0.7 10*3/uL (ref 0.1–1.0)
Monocytes Relative: 8.7 % (ref 3.0–12.0)
Neutro Abs: 6.1 10*3/uL (ref 1.4–7.7)
Neutrophils Relative %: 76 % (ref 43.0–77.0)
Platelets: 249 10*3/uL (ref 150.0–400.0)
RBC: 5.1 Mil/uL (ref 4.22–5.81)
RDW: 16 % — ABNORMAL HIGH (ref 11.5–15.5)
WBC: 8.1 10*3/uL (ref 4.0–10.5)

## 2019-02-07 LAB — BASIC METABOLIC PANEL
BUN: 12 mg/dL (ref 6–23)
CO2: 28 mEq/L (ref 19–32)
Calcium: 9.6 mg/dL (ref 8.4–10.5)
Chloride: 101 mEq/L (ref 96–112)
Creatinine, Ser: 1.01 mg/dL (ref 0.40–1.50)
GFR: 71.51 mL/min (ref >60.00)
Glucose, Bld: 123 mg/dL — ABNORMAL HIGH (ref 70–99)
Potassium: 5.2 mEq/L — ABNORMAL HIGH (ref 3.5–5.1)
Sodium: 138 mEq/L (ref 135–145)

## 2019-02-07 LAB — LIPID PANEL
Cholesterol: 181 mg/dL (ref 0–200)
HDL: 33.5 mg/dL — ABNORMAL LOW (ref >39.00)
LDL Cholesterol: 127 mg/dL — ABNORMAL HIGH (ref 0–99)
NonHDL: 147.33
Total CHOL/HDL Ratio: 5
Triglycerides: 104 mg/dL (ref 0.0–149.0)
VLDL: 20.8 mg/dL (ref 0.0–40.0)

## 2019-02-07 LAB — VITAMIN D 25 HYDROXY (VIT D DEFICIENCY, FRACTURES): VITD: 38.31 ng/mL (ref 30.00–100.00)

## 2019-02-07 LAB — PSA: PSA: 2.48 ng/mL (ref 0.10–4.00)

## 2019-02-07 LAB — HEMOGLOBIN A1C: Hgb A1c MFr Bld: 5.4 % (ref 4.6–6.5)

## 2019-02-07 LAB — BRAIN NATRIURETIC PEPTIDE: Pro B Natriuretic peptide (BNP): 57 pg/mL (ref 0.0–100.0)

## 2019-02-07 LAB — TSH: TSH: 3.2 u[IU]/mL (ref 0.35–4.50)

## 2019-02-07 LAB — VITAMIN B12: Vitamin B-12: 584 pg/mL (ref 211–911)

## 2019-02-07 NOTE — Telephone Encounter (Addendum)
Left message for patient to return my call.  Covid-19 screening questions  Have you traveled in the last 14 days? No If yes where?  Do you now or have you had a fever in the last 14 days? No  Do you have any respiratory symptoms of shortness of breath or cough now or in the last 14 days? No  Do you have any family members or close contacts with diagnosed or suspected Covid-19 in the past 14 days? No  Have you been tested for Covid-19 and found to be positive? No

## 2019-02-08 ENCOUNTER — Telehealth: Payer: Self-pay

## 2019-02-08 ENCOUNTER — Ambulatory Visit: Payer: Medicare Other | Admitting: Gastroenterology

## 2019-02-08 ENCOUNTER — Encounter: Payer: Self-pay | Admitting: Gastroenterology

## 2019-02-08 ENCOUNTER — Other Ambulatory Visit: Payer: Self-pay

## 2019-02-08 VITALS — BP 126/62 | HR 73 | Temp 99.3°F | Wt 233.0 lb

## 2019-02-08 DIAGNOSIS — Z7902 Long term (current) use of antithrombotics/antiplatelets: Secondary | ICD-10-CM

## 2019-02-08 DIAGNOSIS — K5150 Left sided colitis without complications: Secondary | ICD-10-CM

## 2019-02-08 DIAGNOSIS — K2270 Barrett's esophagus without dysplasia: Secondary | ICD-10-CM | POA: Diagnosis not present

## 2019-02-08 MED ORDER — NA SULFATE-K SULFATE-MG SULF 17.5-3.13-1.6 GM/177ML PO SOLN: 1.0000 | Freq: Once | ORAL | 0 refills | Status: AC

## 2019-02-08 MED ORDER — MESALAMINE ER 0.375 G PO CP24: 1.5000 g | ORAL_CAPSULE | Freq: Every day | ORAL | 3 refills | Status: AC

## 2019-02-08 MED ORDER — PANTOPRAZOLE SODIUM 40 MG PO TBEC: 40.0000 mg | DELAYED_RELEASE_TABLET | Freq: Every day | ORAL | 3 refills | Status: AC

## 2019-02-08 NOTE — Patient Instructions (Addendum)
We have sent the following prescriptions to your mail in pharmacy: Apriso and pantoprazole.    If you have not heard from your mail in pharmacy within 1 week or if you have not received your medication in the mail, please contact us at [phone] so we may find out why.   You have been scheduled for an endoscopy and colonoscopy. Please follow the written instructions given to you at your visit today. Please pick up your prep supplies at the pharmacy within the next 1-3 days. If you use inhalers (even only as needed), please bring them with you on the day of your procedure. Your physician has requested that you go to www.startemmi.com and enter the access code given to you at your visit today. This web site gives a general overview about your procedure. However, you should still follow specific instructions given to you by our office regarding your preparation for the procedure.  Thank you for choosing me and [facility].  [name]. Dagoberto Ligas., MD., [facility] Reg[facility]

## 2019-02-08 NOTE — Telephone Encounter (Signed)
   [name] [name] 06-28-1941 [id number]  Dear Dr. Carlis Abbott:  We have scheduled the above named patient for a(n) Upper Endoscopy/Colonoscopy procedure. Our records show that (s)he is on anticoagulation therapy.  Please advise as to whether the patient may come off their therapy of Plavix 5 days prior to their procedure which is scheduled for 03/09/19.  Please route your response to Marlon Pel, CMA.   Sincerely,    [facility]

## 2019-02-08 NOTE — Progress Notes (Signed)
    History of Present Illness: This is a 78 year old male with left-sided ulcerative colitis and Barrett's esophagus.  He had a mild flare of colitis in February.  Blood work remarkable for elevated glucose and WBC. GI pathogen panel unremarkable.  He completed a prednisone taper and since that time he has done well.  He has had a wellness exam with Dr. Jenny Reichmann yesterday.  Hemoglobin was 15.4 however iron saturation was 18.7%.  Rare breakthrough reflux symptoms.  Current Medications, Allergies, Past Medical History, Past Surgical History, Family History and Social History were reviewed in [employer] record.  Physical Exam: General: Well developed, well nourished, no acute distress Head: Normocephalic and atraumatic Eyes:  sclerae anicteric, EOMI Ears: Normal auditory acuity Mouth: No deformity or lesions Lungs: Clear throughout to auscultation Heart: Regular rate and rhythm; no murmurs, rubs or bruits Abdomen: Soft, non tender and non distended. No masses, hepatosplenomegaly or hernias noted. Normal Bowel sounds Rectal: Deferred to colonoscopy Musculoskeletal: Symmetrical with no gross deformities  Pulses:  Normal pulses noted Extremities: No clubbing, cyanosis, edema or deformities noted Neurological: Alert oriented x 4, grossly nonfocal Psychological:  Alert and cooperative. Normal mood and affect   Assessment and Recommendations:  1.  Left sided ulcerative colitis.  Continue Apriso 1.5 g daily.  Schedule surveillance colonoscopy. The risks (including bleeding, perforation, infection, missed lesions, medication reactions and possible hospitalization or surgery if complications occur), benefits, and alternatives to colonoscopy with possible biopsy and possible polypectomy were discussed with the patient and they consent to proceed. REV in 1 ear.   2.  Barrett's esophagus, short segment without dysplasia.  Continue Protonix 40 mg daily.  Follow standard antireflux  measures.  Schedule surveillance EGD. The risks (including bleeding, perforation, infection, missed lesions, medication reactions and possible hospitalization or surgery if complications occur), benefits, and alternatives to endoscopy with possible biopsy and possible dilation were discussed with the patient and they consent to proceed. REV in 1 year.   3. PVD with stents. Hold Plavix 5 days before procedure - will instruct when and how to resume after procedure. Low but real risk of cardiovascular event such as heart attack, stroke, embolism, thrombosis or ischemia/infarct of other organs off Plavix explained and need to seek urgent help if this occurs. The patient consents to proceed. Will communicate by phone or EMR with patient's prescribing provider to confirm that holding Plavix is reasonable in this case.

## 2019-02-09 DIAGNOSIS — L570 Actinic keratosis: Secondary | ICD-10-CM | POA: Diagnosis not present

## 2019-02-09 DIAGNOSIS — C44622 Squamous cell carcinoma of skin of right upper limb, including shoulder: Secondary | ICD-10-CM | POA: Diagnosis not present

## 2019-02-21 NOTE — Telephone Encounter (Signed)
Informed patient per Dr. Carlis Abbott he can hold Plavix 5 days prior to his procedure. Patient verbalized understanding.

## 2019-03-02 ENCOUNTER — Other Ambulatory Visit: Payer: Self-pay | Admitting: Internal Medicine

## 2019-03-02 NOTE — Telephone Encounter (Signed)
Done erx 

## 2019-03-08 ENCOUNTER — Telehealth: Payer: Self-pay | Admitting: Gastroenterology

## 2019-03-08 NOTE — Telephone Encounter (Signed)
Spoke with patient regarding Covid-19 screening questions Covid-19 Screening Questions:  Do you now or have you had a fever in the last 14 days? no  Do you have any respiratory symptoms of shortness of breath or cough now or in the last 14 days? no  Do you have any family members or close contacts with diagnosed or suspected Covid-19 in the past 14 days? no  Have you been tested for Covid-19 and found to be positive? no  Pt made aware of that care partner may wait in the car or come up to the lobby during the procedure but will need to provide their own mask.

## 2019-03-09 ENCOUNTER — Other Ambulatory Visit: Payer: Self-pay

## 2019-03-09 ENCOUNTER — Encounter: Payer: Self-pay | Admitting: Gastroenterology

## 2019-03-09 ENCOUNTER — Ambulatory Visit (AMBULATORY_SURGERY_CENTER): Payer: Medicare Other | Admitting: Gastroenterology

## 2019-03-09 VITALS — BP 127/69 | HR 72 | Temp 97.6°F | Resp 14 | Ht 68.0 in | Wt 233.0 lb

## 2019-03-09 DIAGNOSIS — D123 Benign neoplasm of transverse colon: Secondary | ICD-10-CM | POA: Diagnosis not present

## 2019-03-09 DIAGNOSIS — K208 Other esophagitis: Secondary | ICD-10-CM | POA: Diagnosis not present

## 2019-03-09 DIAGNOSIS — K5150 Left sided colitis without complications: Secondary | ICD-10-CM

## 2019-03-09 DIAGNOSIS — K529 Noninfective gastroenteritis and colitis, unspecified: Secondary | ICD-10-CM | POA: Diagnosis not present

## 2019-03-09 DIAGNOSIS — K5520 Angiodysplasia of colon without hemorrhage: Secondary | ICD-10-CM

## 2019-03-09 DIAGNOSIS — R1310 Dysphagia, unspecified: Secondary | ICD-10-CM | POA: Diagnosis not present

## 2019-03-09 DIAGNOSIS — Z8719 Personal history of other diseases of the digestive system: Secondary | ICD-10-CM

## 2019-03-09 DIAGNOSIS — K219 Gastro-esophageal reflux disease without esophagitis: Secondary | ICD-10-CM | POA: Diagnosis not present

## 2019-03-09 DIAGNOSIS — K5190 Ulcerative colitis, unspecified, without complications: Secondary | ICD-10-CM | POA: Diagnosis not present

## 2019-03-09 MED ORDER — SODIUM CHLORIDE 0.9 % IV SOLN: 500.0000 mL | Freq: Once | INTRAVENOUS | Status: DC

## 2019-03-09 NOTE — Patient Instructions (Signed)
YOU HAD AN ENDOSCOPIC PROCEDURE TODAY AT [facility]:   Refer to the procedure report that was given to you for any specific questions about what was found during the examination.  If the procedure report does not answer your questions, please call your gastroenterologist to clarify.  If you requested that your care partner not be given the details of your procedure findings, then the procedure report has been included in a sealed envelope for you to review at your convenience later.  YOU SHOULD EXPECT: Some feelings of bloating in the abdomen. Passage of more gas than usual.  Walking can help get rid of the air that was put into your GI tract during the procedure and reduce the bloating. If you had a lower endoscopy (such as a colonoscopy or flexible sigmoidoscopy) you may notice spotting of blood in your stool or on the toilet paper. If you underwent a bowel prep for your procedure, you may not have a normal bowel movement for a few days.  Please Note:  You might notice some irritation and congestion in your nose or some drainage.  This is from the oxygen used during your procedure.  There is no need for concern and it should clear up in a day or so.  SYMPTOMS TO REPORT IMMEDIATELY:   Following lower endoscopy (colonoscopy or flexible sigmoidoscopy):  Excessive amounts of blood in the stool  Significant tenderness or worsening of abdominal pains  Swelling of the abdomen that is new, acute  Fever of 100F or higher   Following upper endoscopy (EGD)  Vomiting of blood or coffee ground material  New chest pain or pain under the shoulder blades  Painful or persistently difficult swallowing  New shortness of breath  Fever of 100F or higher  Black, tarry-looking stools   Please see handouts given to you on Polyps and Diverticulosis. May resume Plavix tomorrow.  For urgent or emergent issues, a gastroenterologist can be reached at any hour by calling [phone].   DIET:   We do recommend a small meal at first, but then you may proceed to your regular diet.  Drink plenty of fluids but you should avoid alcoholic beverages for 24 hours.  ACTIVITY:  You should plan to take it easy for the rest of today and you should NOT DRIVE or use heavy machinery until tomorrow (because of the sedation medicines used during the test).    FOLLOW UP: Our staff will call the number listed on your records 48-72 hours following your procedure to check on you and address any questions or concerns that you may have regarding the information given to you following your procedure. If we do not reach you, we will leave a message.  We will attempt to reach you two times.  During this call, we will ask if you have developed any symptoms of COVID 19. If you develop any symptoms (ie: fever, flu-like symptoms, shortness of breath, cough etc.) before then, please call [phone].  If you test positive for Covid 19 in the 2 weeks post procedure, please call and report this information to [place].    If any biopsies were taken you will be contacted by phone or by letter within the next 1-3 weeks.  Please call us at [phone] if you have not heard about the biopsies in 3 weeks.    SIGNATURES/CONFIDENTIALITY: You and/or your care partner have signed paperwork which will be entered into your electronic medical record.  These signatures attest to the  fact that that the information above on your After Visit Summary has been reviewed and is understood.  Full responsibility of the confidentiality of this discharge information lies with you and/or your care-partner.  Thank you for letting us take care of your healthcare needs today.

## 2019-03-09 NOTE — Op Note (Addendum)
[facility] Patient Name: [name] [name] Procedure Date: 03/09/2019 1:24 PM MRN: [id number] Endoscopist: Ladene Artist , MD Age: 78 Referring MD:  Date of Birth: 15-Oct-1940 Gender: Male Account #: [id number] Procedure:                Colonoscopy Indications:              High risk colon cancer surveillance: Ulcerative                            left sided colitis of 8 (or more) years duration Medicines:                Monitored Anesthesia Care Procedure:                Pre-Anesthesia Assessment:                           - Prior to the procedure, a History and Physical                            was performed, and patient medications and                            allergies were reviewed. The patient's tolerance of                            previous anesthesia was also reviewed. The risks                            and benefits of the procedure and the sedation                            options and risks were discussed with the patient.                            All questions were answered, and informed consent                            was obtained. Prior Anticoagulants: The patient has                            taken Plavix (clopidogrel), last dose was 5 days                            prior to procedure. ASA Grade Assessment: III - A                            patient with severe systemic disease. After                            reviewing the risks and benefits, the patient was                            deemed in satisfactory condition to undergo the  procedure.                           After obtaining informed consent, the colonoscope                            was passed under direct vision. Throughout the                            procedure, the patient's blood pressure, pulse, and                            oxygen saturations were monitored continuously. The                            Colonoscope was introduced through the anus and                             advanced to the the cecum, identified by                            appendiceal orifice and ileocecal valve. The                            ileocecal valve, appendiceal orifice, and rectum                            were photographed. The quality of the bowel                            preparation was adequate after extensive lavage and                            suctioning. The colonoscopy was performed without                            difficulty. The patient tolerated the procedure                            well. Scope In: 1:28:57 PM Scope Out: 1:46:04 PM Scope Withdrawal Time: 0 hours 14 minutes 58 seconds  Total Procedure Duration: 0 hours 17 minutes 7 seconds  Findings:                 The perianal and digital rectal examinations were                            normal.                           A single medium-sized localized angiodysplastic                            lesion without bleeding was found in the cecum.  A 7 mm polyp was found in the transverse colon. The                            polyp was sessile. The polyp was removed with a                            cold snare. Resection and retrieval were complete.                           Multiple medium-mouthed diverticula were found in                            the left colon. There was evidence of diverticular                            spasm. There was no evidence of diverticular                            bleeding. Random biopsies obtained throughout.                           The exam was otherwise without abnormality on                            direct and retroflexion views. Complications:            No immediate complications. Estimated blood loss:                            None. Estimated Blood Loss:     Estimated blood loss: none. Impression:               - One 7 mm polyp in the transverse colon, removed                            with a cold snare. Resected  and retrieved.                           - Single cecal AVM.                           - Moderate diverticulosis in the left colon.                           - The examination was otherwise normal on direct                            and retroflexion views. Recommendation:           - Repeat colonoscopy in 3 years for surveillance.                           - Patient has a contact number available for  emergencies. The signs and symptoms of potential                            delayed complications were discussed with the                            patient. Return to normal activities tomorrow.                            Written discharge instructions were provided to the                            patient.                           - Resume previous diet.                           - Continue present medications.                           - Await pathology results.                           - Resume Plavix (clopidogrel) at prior dose                            tomorrow. Refer to managing physician for further                            adjustment of therapy. Ladene Artist, MD 03/09/2019 2:08:25 PM This report has been signed electronically.

## 2019-03-09 NOTE — Progress Notes (Signed)
To PACU, VSS. Report to [email or web address] 

## 2019-03-09 NOTE — Progress Notes (Signed)
Called to room to assist during endoscopic procedure.  Patient ID and intended procedure confirmed with present staff. Received instructions for my participation in the procedure from the performing physician.  

## 2019-03-09 NOTE — Op Note (Signed)
[facility] Patient Name: [name] [name] Procedure Date: 03/09/2019 1:23 PM MRN: [id number] Endoscopist: Ladene Artist , MD Age: 78 Referring MD:  Date of Birth: April 07, 1941 Gender: Male Account #: [id number] Procedure:                Upper GI endoscopy Indications:              Surveillance for malignancy due to personal history                            of Barrett's esophagus, Dysphagia, Gastroesophageal                            reflux disease Medicines:                Monitored Anesthesia Care Procedure:                Pre-Anesthesia Assessment:                           - Prior to the procedure, a History and Physical                            was performed, and patient medications and                            allergies were reviewed. The patient's tolerance of                            previous anesthesia was also reviewed. The risks                            and benefits of the procedure and the sedation                            options and risks were discussed with the patient.                            All questions were answered, and informed consent                            was obtained. Prior Anticoagulants: The patient has                            taken Plavix (clopidogrel), last dose was 5 days                            prior to procedure. ASA Grade Assessment: III - A                            patient with severe systemic disease. After                            reviewing the risks and benefits, the patient was  deemed in satisfactory condition to undergo the                            procedure.                           After obtaining informed consent, the endoscope was                            passed under direct vision. Throughout the                            procedure, the patient's blood pressure, pulse, and                            oxygen saturations were monitored continuously. The        Endoscope was introduced through the mouth, and                            advanced to the second part of duodenum. The upper                            GI endoscopy was accomplished without difficulty.                            The patient tolerated the procedure well. Scope In: Scope Out: Findings:                 There were esophageal mucosal changes secondary to                            established short-segment Barrett's disease present                            in the distal esophagus. The maximum longitudinal                            extent of these mucosal changes was 1 cm in length.                            Mucosa was biopsied with a cold forceps for                            histology in a targeted manner at intervals of 1 cm                            at the gastroesophageal junction. One specimen                            bottle was sent to pathology.                           No endoscopic abnormality was evident in the  esophagus to explain the patient's complaint of                            dysphagia. It was decided, however, to proceed with                            dilation of the entire esophagus. A guidewire was                            placed and the scope was withdrawn. Dilation was                            performed with a Savary dilator with no resistance                            at 17 mm.                           The entire examined stomach was normal.                           The duodenal bulb and second portion of the                            duodenum were normal. Complications:            No immediate complications. Estimated Blood Loss:     Estimated blood loss was minimal. Impression:               - Esophageal mucosal changes secondary to                            established short-segment Barrett's disease.                            Biopsied.                           - No endoscopic esophageal  abnormality to explain                            patient's dysphagia. Esophagus dilated. Dilated.                           - Normal stomach.                           - Normal duodenal bulb and second portion of the                            duodenum. Recommendation:           - Patient has a contact number available for                            emergencies. The signs and symptoms of potential  delayed complications were discussed with the                            patient. Return to normal activities tomorrow.                            Written discharge instructions were provided to the                            patient.                           - Resume previous diet.                           - Continue present medications.                           - Await pathology results.                           - Repeat upper endoscopy after pathology studies                            are complete for surveillance.                           - Resume Plavix (clopidogrel) at prior dose                            tomorrow. Refer to managing physician for further                            adjustment of therapy. Ladene Artist, MD 03/09/2019 2:19:00 PM This report has been signed electronically.

## 2019-03-14 ENCOUNTER — Telehealth: Payer: Self-pay

## 2019-03-14 NOTE — Telephone Encounter (Signed)
  Follow up Call-  Call back number 03/09/2019  Post procedure Call Back phone  # [phone]  Permission to leave phone message Yes  Some recent data might be hidden     Patient questions:  Do you have a fever, pain , or abdominal swelling? No. Pain Score  0 *  Have you tolerated food without any problems? Yes.    Have you been able to return to your normal activities? Yes.    Do you have any questions about your discharge instructions: Diet   No. Medications  No. Follow up visit  No.  Do you have questions or concerns about your Care? No.  Actions: * If pain score is 4 or above: No action needed, pain <4.  1. Have you developed a fever since your procedure? no  2.   Have you had an respiratory symptoms (SOB or cough) since your procedure? no  3.   Have you tested positive for COVID 19 since your procedure no  4.   Have you had any family members/close contacts diagnosed with the COVID 19 since your procedure?  no   If yes to any of these questions please route to Joylene John, RN and Alphonsa Gin, Therapist, sports.

## 2019-03-21 ENCOUNTER — Emergency Department (HOSPITAL_COMMUNITY)
Admission: EM | Admit: 2019-03-21 | Discharge: 2019-03-21 | Disposition: A | Discharge status: Home / Self Care | Payer: Medicare Other | Attending: Emergency Medicine | Admitting: Emergency Medicine

## 2019-03-21 ENCOUNTER — Encounter (HOSPITAL_COMMUNITY): Payer: Self-pay

## 2019-03-21 ENCOUNTER — Other Ambulatory Visit: Payer: Self-pay

## 2019-03-21 DIAGNOSIS — Z87891 Personal history of nicotine dependence: Secondary | ICD-10-CM | POA: Diagnosis not present

## 2019-03-21 DIAGNOSIS — Z79899 Other long term (current) drug therapy: Secondary | ICD-10-CM | POA: Insufficient documentation

## 2019-03-21 DIAGNOSIS — Z85828 Personal history of other malignant neoplasm of skin: Secondary | ICD-10-CM | POA: Insufficient documentation

## 2019-03-21 DIAGNOSIS — T63461A Toxic effect of venom of wasps, accidental (unintentional), initial encounter: Secondary | ICD-10-CM | POA: Insufficient documentation

## 2019-03-21 DIAGNOSIS — R0602 Shortness of breath: Secondary | ICD-10-CM | POA: Diagnosis not present

## 2019-03-21 DIAGNOSIS — I10 Essential (primary) hypertension: Secondary | ICD-10-CM | POA: Insufficient documentation

## 2019-03-21 DIAGNOSIS — T63481A Toxic effect of venom of other arthropod, accidental (unintentional), initial encounter: Secondary | ICD-10-CM

## 2019-03-21 MED ORDER — PREDNISONE 20 MG PO TABS
40.0000 mg | ORAL_TABLET | Freq: Once | ORAL | Status: AC
  Administered 2019-03-21: 40 mg via ORAL
  Filled 2019-03-21: qty 2

## 2019-03-21 MED ORDER — DIPHENHYDRAMINE HCL 25 MG PO CAPS
25.0000 mg | ORAL_CAPSULE | Freq: Once | ORAL | Status: AC
  Administered 2019-03-21: 25 mg via ORAL
  Filled 2019-03-21: qty 1

## 2019-03-21 MED ORDER — PREDNISONE 10 MG PO TABS: 20.0000 mg | ORAL_TABLET | Freq: Every day | ORAL | 0 refills | Status: DC

## 2019-03-21 NOTE — ED Notes (Signed)
ED Provider at bedside. 

## 2019-03-21 NOTE — ED Provider Notes (Signed)
[facility] EMERGENCY DEPARTMENT Provider Note   CSN: [id number] Arrival date & time: 03/21/19  1056     History   Chief Complaint Chief Complaint  Patient presents with  . Insect Bite    HPI [name] [name] is a 78 y.o. male.     78 year old male with prior medical history as detailed below presents for evaluation following reported insect sting.  Patient reports that approximate 9:00 this morning he was stung in the left upper lip by a wasp.  Subsequently he has developed swelling to his left upper lip.  He started to feel some mild shortness of breath so he decided to come to the ED for evaluation.  He reports feeling improved upon my evaluation.  It is been 2 hours since his insect sting.  He has not taken any medications for his symptoms.  He denies significant prior anaphylaxis related to insect stings.  He is speaking in full sentences.  He appears to be in no acute distress.  The history is provided by the patient and medical records.  Illness Location:  Wasp sting to left upper lip Severity:  Mild Onset quality:  Sudden Duration:  2 hours Timing:  Rare Progression:  Unchanged Chronicity:  New Associated symptoms: shortness of breath   Associated symptoms: no fever     Past Medical History:  Diagnosis Date  . Abdominal pain, unspecified site 10/16/2009  . Anal fissure 04/24/2008  . Arthritis   . B12 deficiency 10/11/2014  . BACK PAIN 01/10/2009  . Barrett's esophagus 1997  . BARRETTS ESOPHAGUS 08/16/2007  . BREAST HYPERTROPHY 02/16/2009  . CARBUNCLE, NECK 02/14/2008  . Cataract   . DIVERTICULOSIS, COLON 06/28/2007  . Full dentures   . GERD 06/28/2007  . GLUCOSE INTOLERANCE 08/15/2008  . Headache(784.0) 11/13/2009  . HERNIA, UMBILICAL W/OBSTRUCTION W/O GANGRENE 06/28/2007  . HTN (hypertension) 07/12/2012  . HYPERLIPIDEMIA 06/28/2007  . HYPERTENSION 06/28/2007  . Impaired glucose tolerance 04/13/2011  . INSOMNIA-SLEEP DISORDER-UNSPEC 10/10/2008  . LEG  PAIN, BILATERAL 02/16/2009  . LOW BACK PAIN 06/28/2007  . MASTITIS 01/10/2009  . NECK PAIN 05/31/2008  . NEPHROLITHIASIS, HX OF 06/28/2007  . PROCTOSIGMOIDITIS, ULCERATIVE 1997  . PVD (peripheral vascular disease) with claudication, lifestyle limiting, ABI rt. 0.67, lt. 0.73 07/12/2012  . S/P angioplasty with stent, after DB athrectomy to Rt. ext. iliac and Rt. SFA 07/12/2012  . Skin cancer    right hand  . Spinal stenosis at L4-L5 level   . Stenosis of left carotid artery, mod to severe by dopplers 09/29/2012  . TESTICULAR PAIN, RIGHT 10/25/2010  . VITAMIN D DEFICIENCY 11/13/2009    Patient Active Problem List   Diagnosis Date Noted  . Dyspnea 01/28/2019  . Acute sinusitis 11/18/2018  . Wheezing 11/18/2018  . PAD (peripheral artery disease) ([place]) 07/19/2018  . PVD (peripheral vascular disease) ([place]) 05/04/2018  . Spinal stenosis of lumbar region 11/05/2016  . Spinal stenosis at L4-L5 level 11/05/2016  . Mastalgia 04/10/2016  . B12 deficiency 10/11/2014  . Bilateral leg pain 10/10/2014  . Double vision with both eyes open 10/13/2013  . Stenosis of left carotid artery, mod to severe by dopplers 09/29/2012  . S/P angioplasty with stent, and [place]bo[place] athrectomy mid lt. SFA for life style limiting claudication 09/28/12 09/29/2012  . PVD (peripheral vascular disease) with claudication, lifestyle limiting, ABI rt. 0.67, lt. 0.73 07/12/2012  . Thromboembolism, distal  Rt SFA  treated with aspiration thrombectomy, 07/12/12 07/12/2012  . S/P insertion of iliac artery stent,  external iliac and RT SFA  with DB athrectomy 07/12/12 07/12/2012  . Impaired glucose tolerance 04/13/2011  . Encounter for well adult exam with abnormal findings 04/13/2011  . Chronic universal ulcerative colitis ([place]) 11/14/2009  . VITAMIN D DEFICIENCY 11/13/2009  . Depression 10/16/2009  . SHOULDER PAIN, LEFT 10/16/2009  . HIP PAIN, RIGHT 10/16/2009  . Gynecomastia 02/16/2009  . BACK PAIN 01/10/2009  . INSOMNIA-SLEEP  DISORDER-UNSPEC 10/10/2008  . Chest pain 10/10/2008  . NECK PAIN 05/31/2008  . Hx of ulcerative colitis 04/24/2008  . Anal fissure 04/24/2008  . Personal history of colonic polyps 04/20/2008  . CARBUNCLE, NECK 02/14/2008  . BARRETTS ESOPHAGUS 08/16/2007  . Hyperlipidemia 06/28/2007  . Essential hypertension 06/28/2007  . GERD 06/28/2007  . DIVERTICULOSIS, COLON 06/28/2007  . Lower back pain 06/28/2007  . NEPHROLITHIASIS, HX OF 06/28/2007    Past Surgical History:  Procedure Laterality Date  . ABDOMINAL AORTOGRAM N/A 05/13/2018   Procedure: ABDOMINAL AORTOGRAM;  Surgeon: [name], MD;  Location: [facility];  Service: Cardiovascular;  Laterality: N/A;  . anal fissure surgury     pt unsure of this  . ATHERECTOMY  09/28/2012  . ATHERECTOMY N/A 07/12/2012   Procedure: ATHERECTOMY;  Surgeon: [name], MD;  Location: [facility] CATH LAB;  Service: Cardiovascular;  Laterality: N/A;  . ATHERECTOMY N/A 09/28/2012   Procedure: ATHERECTOMY;  Surgeon: [name], MD;  Location: [facility] CATH LAB;  Service: Cardiovascular;  Laterality: N/A;  . bilateral inguinal hernia with mesh and umbilical hernia repairs  09/2007  . CATARACT EXTRACTION Bilateral    bilateral  . COLONOSCOPY    . ENDARTERECTOMY FEMORAL Right 07/19/2018   Procedure: ENDARTERECTOMY RIGHT FEMORAL ARTERY;  Surgeon: [name], MD;  Location: [facility];  Service: Vascular;  Laterality: Right;  . GROIN DISSECTION Right 07/20/2013   Procedure: GROIN EXPLORATION, EXCISIONAL BIOPSY RIGHT INGUINAL LYMPH NODES;  Surgeon: Harl Bowie, MD;  Location: [facility];  Service: General;  Laterality: Right;  . HERNIA REPAIR    . left knee arthroscopy    . LOWER EXTREMITY ANGIOGRAPHY Bilateral 05/13/2018   Procedure: Lower Extremity Angiography;  Surgeon: [name], MD;  Location: [place] [facility];  Service: Cardiovascular;  Laterality: Bilateral;  . LUMBAR LAMINECTOMY/DECOMPRESSION  MICRODISCECTOMY N/A 11/05/2016   Procedure: Microlumbar decompression L2-3, L3-4, L4-5, lateral mass fusion L4-5;  Surgeon: Susa Day, MD;  Location: [facility];  Service: Orthopedics;  Laterality: N/A;  . PATCH ANGIOPLASTY Right 07/19/2018   Procedure: RIGHT FEMORAL PATCH ANGIOPLASTY WITH XENOSURE BIOLOGIC PATCH;  Surgeon: [name], MD;  Location: [facility];  Service: Vascular;  Laterality: Right;  . PERCUTANEOUS STENT INTERVENTION  09/28/2012   Procedure: PERCUTANEOUS STENT INTERVENTION;  Surgeon: [name], MD;  Location[facility] CATH LAB;  Service: Cardiovascular;;  . PERIPHERAL VASCULAR INTERVENTION  05/13/2018   Procedure: PERIPHERAL VASCULAR INTERVENTION;  Surgeon: [name], MD;  Location: [facility] CV LAB;  Service: Cardiovascular;;  Ext. Iliac  . UPPER GI ENDOSCOPY          Home Medications    Prior to Admission medications   Medication Sig Start Date End Date Taking? Authorizing Provider  albuterol (VENTOLIN HFA) 108 (90 Base) MCG/ACT inhaler Inhale 2 puffs into the lungs every 6 (six) hours as needed for wheezing or shortness of breath. 01/28/19   Biagio Borg, MD  allopurinol (ZYLOPRIM) 100 MG tablet Take 2 tablets (200 mg total) by mouth daily. 05/12/18   Tamala Julian,  Olevia Bowens, DO  budesonide-formoterol (SYMBICORT) 160-4.5 MCG/ACT inhaler Inhale 2 puffs into the lungs 2 (two) times daily. 01/28/19   Biagio Borg, MD  clopidogrel (PLAVIX) 75 MG tablet TAKE 1 TABLET BY MOUTH  DAILY 11/25/18   Biagio Borg, MD  cyanocobalamin 1000 MCG tablet Take 1,000 mcg by mouth daily.     [provider]  lisinopril-hydrochlorothiazide (PRINZIDE,ZESTORETIC) 20-25 MG tablet TAKE 1 TABLET BY MOUTH  DAILY 11/25/18   Biagio Borg, MD  mesalamine (APRISO) 0.375 g 24 hr capsule Take 4 capsules (1.5 g total) by mouth daily. Take 4 tablets all at one time. 02/08/19   Ladene Artist, MD  pantoprazole (PROTONIX) 40 MG tablet Take 1 tablet (40 mg total) by mouth daily. 02/08/19    Ladene Artist, MD  zolpidem (AMBIEN) 10 MG tablet TAKE 1 TABLET BY MOUTH AT BEDTIME AS NEEDED FOR SLEEP. 03/02/19   Biagio Borg, MD    Family History Family History  Problem Relation Age of Onset  . COPD Mother   . Cerebral aneurysm Mother   . COPD Father   . Colon cancer Neg Hx   . Esophageal cancer Neg Hx   . Rectal cancer Neg Hx   . Stomach cancer Neg Hx   . Colon polyps Neg Hx     Social History Social History   Tobacco Use  . Smoking status: Former Smoker    Quit date: 09/08/1972    Years since quitting: 46.5  . Smokeless tobacco: Former Systems developer    Types: Chew  Substance Use Topics  . Alcohol use: Not Currently    Alcohol/week: 5.0 standard drinks    Types: 5 Cans of beer per week  . Drug use: No     Allergies   Contrast media [iodinated diagnostic agents] and Statins   Review of Systems Review of Systems  Constitutional: Negative for fever.  Respiratory: Positive for shortness of breath.   All other systems reviewed and are negative.    Physical Exam Updated Vital Signs Pulse 96   Resp 14   Ht 5' 8"  (1.727 m)   Wt 105.7 kg   SpO2 97%   BMI 35.43 kg/m   Physical Exam Vitals signs and nursing note reviewed.  Constitutional:      General: He is not in acute distress.    Appearance: He is well-developed.  HENT:     Head: Normocephalic and atraumatic.     Mouth/Throat:     Comments: Mild edema and tenderness to the left upper lip at the site of the reported insect sting.  No noted tongue swelling.  Normal phonation.  No difficulty with his own secretions.  Speaking in full sentences.  No stridor. Eyes:     Conjunctiva/sclera: Conjunctivae normal.     Pupils: Pupils are equal, round, and reactive to light.  Neck:     Musculoskeletal: Normal range of motion and neck supple.  Cardiovascular:     Rate and Rhythm: Normal rate and regular rhythm.     Heart sounds: Normal heart sounds.  Pulmonary:     Effort: Pulmonary effort is normal. No  respiratory distress.     Breath sounds: Normal breath sounds.  Abdominal:     General: There is no distension.     Palpations: Abdomen is soft.     Tenderness: There is no abdominal tenderness.  Musculoskeletal: Normal range of motion.        General: No deformity.  Skin:  General: Skin is warm and dry.  Neurological:     Mental Status: He is alert and oriented to person, place, and time.      ED Treatments / Results  Labs (all labs ordered are listed, but only abnormal results are displayed) Labs Reviewed - No data to display  EKG None  Radiology No results found.  Procedures Procedures (including critical care time)  Medications Ordered in ED Medications  predniSONE (DELTASONE) tablet 40 mg (has no administration in time range)  diphenhydrAMINE (BENADRYL) capsule 25 mg (has no administration in time range)     Initial Impression / Assessment and Plan / ED Course  I have reviewed the triage vital signs and the nursing notes.  Pertinent labs & imaging results that were available during my care of the patient were reviewed by me and considered in my medical decision making (see chart for details).        MDM  Screen complete  [name] [name] was evaluated in Emergency Department on 03/21/2019 for the symptoms described in the history of present illness. He was evaluated in the context of the global COVID-19 pandemic, which necessitated consideration that the patient might be at risk for infection with the SARS-CoV-2 virus that causes COVID-19. Institutional protocols and algorithms that pertain to the evaluation of patients at risk for COVID-19 are in a state of rapid change based on information released by regulatory bodies including the CDC and federal and state organizations. These policies and algorithms were followed during the patient's care in the ED.   Patient is presenting for evaluation following reported insect sting.  Initial exam does not suggest  anaphylactic or systemic response.  Out of abundance of caution patient is given Benadryl and prednisone.  Patient observed in the ED, and appears improved.  He appears to be appropriate for discharge.  Strict return cautions given and understood.  Importance of close follow-up was stressed.  Patient will be discharged with short course of prednisone.  Final Clinical Impressions(s) / ED Diagnoses   Final diagnoses:  Insect stings, accidental or unintentional, initial encounter    ED Discharge Orders         Ordered    predniSONE (DELTASONE) 10 MG tablet  Daily     03/21/19 1200           Valarie Merino, MD 03/21/19 1201

## 2019-03-21 NOTE — ED Triage Notes (Addendum)
Stung by a "wasp" around 9am, noticed throat welling and came to hospital

## 2019-03-21 NOTE — Discharge Instructions (Addendum)
Return for any problem.  Follow-up with your regular care provider as instructed.

## 2019-03-23 ENCOUNTER — Encounter: Payer: Self-pay | Admitting: Gastroenterology

## 2019-04-07 ENCOUNTER — Other Ambulatory Visit: Payer: Self-pay

## 2019-04-07 ENCOUNTER — Ambulatory Visit (INDEPENDENT_AMBULATORY_CARE_PROVIDER_SITE_OTHER): Payer: Medicare Other | Admitting: Internal Medicine

## 2019-04-07 ENCOUNTER — Encounter: Payer: Self-pay | Admitting: Internal Medicine

## 2019-04-07 ENCOUNTER — Telehealth: Payer: Self-pay | Admitting: Family Medicine

## 2019-04-07 DIAGNOSIS — K4090 Unilateral inguinal hernia, without obstruction or gangrene, not specified as recurrent: Secondary | ICD-10-CM | POA: Diagnosis not present

## 2019-04-07 DIAGNOSIS — K219 Gastro-esophageal reflux disease without esophagitis: Secondary | ICD-10-CM | POA: Diagnosis not present

## 2019-04-07 DIAGNOSIS — R7302 Impaired glucose tolerance (oral): Secondary | ICD-10-CM

## 2019-04-07 NOTE — Progress Notes (Signed)
Subjective:    Patient ID: [name] [name], male    DOB: 07-11-1941, 78 y.o.   MRN: [id number]  HPI   Here with 3 days onset right groin swelling with new tenderness and pain, better to lie down, worse to stand or bend forward.  Has hx of other hernia repaired per Dr Johney Maine, pt asks for f/u referral.  Denies worsening reflux, abd pain, dysphagia, n/v, bowel change or blood.Denies urinary symptoms such as dysuria, frequency, urgency, flank pain, hematuria or n/v, fever, chills. Pt denies chest pain, increased sob or doe, wheezing, orthopnea, PND, increased LE swelling, palpitations, dizziness or syncope. Past Medical History:  Diagnosis Date  . Abdominal pain, unspecified site 10/16/2009  . Anal fissure 04/24/2008  . Arthritis   . B12 deficiency 10/11/2014  . BACK PAIN 01/10/2009  . Barrett's esophagus 1997  . BARRETTS ESOPHAGUS 08/16/2007  . BREAST HYPERTROPHY 02/16/2009  . CARBUNCLE, NECK 02/14/2008  . Cataract   . DIVERTICULOSIS, COLON 06/28/2007  . Full dentures   . GERD 06/28/2007  . GLUCOSE INTOLERANCE 08/15/2008  . Headache(784.0) 11/13/2009  . HERNIA, UMBILICAL W/OBSTRUCTION W/O GANGRENE 06/28/2007  . HTN (hypertension) 07/12/2012  . HYPERLIPIDEMIA 06/28/2007  . HYPERTENSION 06/28/2007  . Impaired glucose tolerance 04/13/2011  . INSOMNIA-SLEEP DISORDER-UNSPEC 10/10/2008  . LEG PAIN, BILATERAL 02/16/2009  . LOW BACK PAIN 06/28/2007  . MASTITIS 01/10/2009  . NECK PAIN 05/31/2008  . NEPHROLITHIASIS, HX OF 06/28/2007  . PROCTOSIGMOIDITIS, ULCERATIVE 1997  . PVD (peripheral vascular disease) with claudication, lifestyle limiting, ABI rt. 0.67, lt. 0.73 07/12/2012  . S/P angioplasty with stent, after DB athrectomy to Rt. ext. iliac and Rt. SFA 07/12/2012  . Skin cancer    right hand  . Spinal stenosis at L4-L5 level   . Stenosis of left carotid artery, mod to severe by dopplers 09/29/2012  . TESTICULAR PAIN, RIGHT 10/25/2010  . VITAMIN D DEFICIENCY 11/13/2009   Past Surgical History:  Procedure  Laterality Date  . ABDOMINAL AORTOGRAM N/A 05/13/2018   Procedure: ABDOMINAL AORTOGRAM;  Surgeon: [name], MD;  Location: [place] [facility];  Service: Cardiovascular;  Laterality: N/A;  . anal fissure surgury     pt unsure of this  . ATHERECTOMY  09/28/2012  . ATHERECTOMY N/A 07/12/2012   Procedure: ATHERECTOMY;  Surgeon: [name], MD;  Location: [facility] CATH LAB;  Service: Cardiovascular;  Laterality: N/A;  . ATHERECTOMY N/A 09/28/2012   Procedure: ATHERECTOMY;  Surgeon: [name], MD;  Location: [facility] CATH LAB;  Service: Cardiovascular;  Laterality: N/A;  . bilateral inguinal hernia with mesh and umbilical hernia repairs  09/2007  . CATARACT EXTRACTION Bilateral    bilateral  . COLONOSCOPY    . ENDARTERECTOMY FEMORAL Right 07/19/2018   Procedure: ENDARTERECTOMY RIGHT FEMORAL ARTERY;  Surgeon: [name], MD;  Location: [place];  Service: Vascular;  Laterality: Right;  . GROIN DISSECTION Right 07/20/2013   Procedure: GROIN EXPLORATION, EXCISIONAL BIOPSY RIGHT INGUINAL LYMPH NODES;  Surgeon: Harl Bowie, MD;  Location: [place];  Service: General;  Laterality: Right;  . HERNIA REPAIR    . left knee arthroscopy    . LOWER EXTREMITY ANGIOGRAPHY Bilateral 05/13/2018   Procedure: Lower Extremity Angiography;  Surgeon: [name], MD;  Location: [place] [facility];  Service: Cardiovascular;  Laterality: Bilateral;  . LUMBAR LAMINECTOMY/DECOMPRESSION MICRODISCECTOMY N/A 11/05/2016   Procedure: Microlumbar decompression L2-3, L3-4, L4-5, lateral mass fusion L4-5;  Surgeon: Susa Day, MD;  [facility]: [facility];  Service:  Orthopedics;  Laterality: N/A;  . PATCH ANGIOPLASTY Right 07/19/2018   Procedure: RIGHT FEMORAL PATCH ANGIOPLASTY WITH XENOSURE BIOLOGIC PATCH;  Surgeon: [name], MD;  Location: [place];  Service: Vascular;  Laterality: Right;  . PERCUTANEOUS STENT INTERVENTION  09/28/2012   Procedure: PERCUTANEOUS STENT  INTERVENTION;  Surgeon: [name], MD;  Location: [facility] CATH LAB;  Service: Cardiovascular;;  . PERIPHERAL VASCULAR INTERVENTION  05/13/2018   Procedure: PERIPHERAL VASCULAR INTERVENTION;  Surgeon: [name], MD;  Location: [place] [facility];  Service: Cardiovascular;;  Ext. Iliac  . UPPER GI ENDOSCOPY      reports that he quit smoking about 46 years ago. He has quit using smokeless tobacco.  His smokeless tobacco use included chew. He reports previous alcohol use of about 5.0 standard drinks of alcohol per week. He reports that he does not use drugs. family history includes COPD in his father and mother; Cerebral aneurysm in his mother. Allergies  Allergen Reactions  . Wasp Venom Anaphylaxis  . Contrast Media [Iodinated Diagnostic Agents] Other (See Comments)    Break out in welts  . Statins Other (See Comments)    Legs pain: Atorvastatin and Lovastatin   Current Outpatient Medications on File Prior to Visit  Medication Sig Dispense Refill  . albuterol (VENTOLIN HFA) 108 (90 Base) MCG/ACT inhaler Inhale 2 puffs into the lungs every 6 (six) hours as needed for wheezing or shortness of breath. 3 Inhaler 3  . allopurinol (ZYLOPRIM) 100 MG tablet Take 2 tablets (200 mg total) by mouth daily. 180 tablet 6  . budesonide-formoterol (SYMBICORT) 160-4.5 MCG/ACT inhaler Inhale 2 puffs into the lungs 2 (two) times daily. 1 Inhaler 11  . clopidogrel (PLAVIX) 75 MG tablet TAKE 1 TABLET BY MOUTH  DAILY (Patient taking differently: Take 75 mg by mouth daily. ) 90 tablet 2  . cyanocobalamin 1000 MCG tablet Take 1,000 mcg by mouth daily.     [name] lisinopril-hydrochlorothiazide (PRINZIDE,ZESTORETIC) 20-25 MG tablet TAKE 1 TABLET BY MOUTH  DAILY 90 tablet 2  . mesalamine (APRISO) 0.375 g 24 hr capsule Take 4 capsules (1.5 g total) by mouth daily. Take 4 tablets all at one time. 360 capsule 3  . pantoprazole (PROTONIX) 40 MG tablet Take 1 tablet (40 mg total) by mouth daily. 90 tablet 3  .  predniSONE (DELTASONE) 10 MG tablet Take 2 tablets (20 mg total) by mouth daily. 10 tablet 0  . zolpidem (AMBIEN) 10 MG tablet TAKE 1 TABLET BY MOUTH AT BEDTIME AS NEEDED FOR SLEEP. (Patient taking differently: Take 10 mg by mouth at bedtime. ) 30 tablet 5   Current Facility-Administered Medications on File Prior to Visit  Medication Dose Route Frequency Provider Last Rate Last Dose  . 0.9 %  sodium chloride infusion  500 mL Intravenous Once Ladene Artist, MD       Review of Systems  Constitutional: Negative for other unusual diaphoresis or sweats HENT: Negative for ear discharge or swelling Eyes: Negative for other worsening visual disturbances Respiratory: Negative for stridor or other swelling  Gastrointestinal: Negative for worsening distension or other blood Genitourinary: Negative for retention or other urinary change Musculoskeletal: Negative for other MSK pain or swelling Skin: Negative for color change or other new lesions Neurological: Negative for worsening tremors and other numbness  Psychiatric/Behavioral: Negative for worsening agitation or other fatigue All other system neg per pt    Objective:   Physical Exam BP (!) 142/78   Pulse 87   Temp 98 F (  36.7 C) (Oral)   Ht 5' 8"  (1.727 m)   Wt 232 lb (105.2 kg)   SpO2 97%   BMI 35.28 kg/m  VS noted,  Constitutional: Pt appears in NAD HENT: Head: NCAT.  Right Ear: External ear normal.  Left Ear: External ear normal.  Eyes: . Pupils are equal, round, and reactive to light. Conjunctivae and EOM are normal Nose: without d/c or deformity Neck: Neck supple. Gross normal ROM Cardiovascular: Normal rate and regular rhythm.   Pulmonary/Chest: Effort normal and breath sounds without rales or wheezing.  Abd:  Soft, NT, ND, + BS, no organomegaly Right groin with reducible tender swelling Neurological: Pt is alert. At baseline orientation, motor grossly intact Skin: Skin is warm. No rashes, other new lesions, no LE edema  Psychiatric: Pt behavior is normal without agitation  No other exam findings Lab Results  Component Value Date   WBC 8.1 02/07/2019   HGB 15.4 02/07/2019   HCT 45.3 02/07/2019   PLT 249.0 02/07/2019   GLUCOSE 123 (H) 02/07/2019   CHOL 181 02/07/2019   TRIG 104.0 02/07/2019   HDL 33.50 (L) 02/07/2019   LDLDIRECT 184.7 10/14/2013   LDLCALC 127 (H) 02/07/2019   ALT 12 02/07/2019   AST 12 02/07/2019   NA 138 02/07/2019   K 5.2 (H) 02/07/2019   CL 101 02/07/2019   CREATININE 1.01 02/07/2019   BUN 12 02/07/2019   CO2 28 02/07/2019   TSH 3.20 02/07/2019   PSA 2.48 02/07/2019   INR 1.06 07/12/2018   HGBA1C 5.4 02/07/2019   MICROALBUR 1.4 10/14/2010         Assessment & Plan:

## 2019-04-07 NOTE — Patient Instructions (Signed)
Please continue all other medications as before, and refills have been done if requested.  Please have the pharmacy call with any other refills you may need.  Please keep your appointments with your specialists as you may have planned  You will be contacted regarding the referral for: Dr Johney Maine

## 2019-04-07 NOTE — Assessment & Plan Note (Signed)
stable overall by history and exam, recent data reviewed with pt, and pt to continue medical treatment as before,  to f/u any worsening symptoms or concerns  

## 2019-04-07 NOTE — Telephone Encounter (Signed)
I have scheduled pt for 8/21.  He would like to know if he can get in sooner for cramping in feet, hands, and burning in thighs.

## 2019-04-07 NOTE — Assessment & Plan Note (Signed)
Symptomatic, for referral general surgury as requested

## 2019-04-29 ENCOUNTER — Encounter: Payer: Self-pay | Admitting: Family Medicine

## 2019-04-29 ENCOUNTER — Ambulatory Visit: Payer: Medicare Other | Admitting: Family Medicine

## 2019-04-29 ENCOUNTER — Other Ambulatory Visit: Payer: Self-pay

## 2019-04-29 ENCOUNTER — Ambulatory Visit (INDEPENDENT_AMBULATORY_CARE_PROVIDER_SITE_OTHER)
Admission: RE | Admit: 2019-04-29 | Discharge: 2019-04-29 | Disposition: A | Discharge status: Home / Self Care | Payer: Medicare Other | Source: Ambulatory Visit | Attending: Family Medicine | Admitting: Family Medicine

## 2019-04-29 VITALS — BP 126/66 | HR 44 | Ht 68.0 in | Wt 236.0 lb

## 2019-04-29 DIAGNOSIS — M5416 Radiculopathy, lumbar region: Secondary | ICD-10-CM | POA: Diagnosis not present

## 2019-04-29 DIAGNOSIS — I739 Peripheral vascular disease, unspecified: Secondary | ICD-10-CM | POA: Diagnosis not present

## 2019-04-29 DIAGNOSIS — M545 Low back pain: Secondary | ICD-10-CM | POA: Diagnosis not present

## 2019-04-29 DIAGNOSIS — M48061 Spinal stenosis, lumbar region without neurogenic claudication: Secondary | ICD-10-CM

## 2019-04-29 MED ORDER — GABAPENTIN 100 MG PO CAPS: 100.0000 mg | ORAL_CAPSULE | Freq: Every day | ORAL | 3 refills | Status: AC

## 2019-04-29 NOTE — Patient Instructions (Signed)
Gabapentin 100 mg at night New xray of the back [facility] [phone] Iron 65 mg Vitamin C 500 mg 3 times a week See me again in 4 weeks

## 2019-04-29 NOTE — Assessment & Plan Note (Signed)
Patient has spinal stenosis and I do think it is contributing to some of the leg pain.  We have looked at patient laboratory work-up and was found to have some very mild iron deficiency.  Peripheral vascular disease.  Discussed the possibility of Pletal to help with this as well.  Had responded well to it in the past but has had dizziness previously.  Patient will hold on that medication but encourage patient to try the gabapentin at night to help with any of the neurogenic claudication.  If it does not work I would like to do an epidural.  Patient is on Plavix and would need approval to hold for 5 days either from primary care or cardiology.  Patient will follow-up with me 2 to 3 weeks after the injection if that occurs otherwise will follow-up with me in 4 to 6 weeks

## 2019-04-29 NOTE — Progress Notes (Signed)
[name] [address] [place], [place] [address] Phone: [phone] Subjective:   [name] [name], am serving as a scribe for Dr. Hulan Saas.  I'm seeing this patient by the request  of:    CC: Leg pain, back pain and spasms  VOZ:[id number]   05/13/2019 Likely contributing to all of patient's pain.  Differential includes a lumbar radiculopathy with spinal stenosis.  History of an L4-L5 spinal stenosis.  Patient does not make improvement after the surgery then we will consider further evaluation with possible injections.  Patient is somewhat high risk with being on different medications.  We will monitor.  Update 04/29/2019 [name] [name] is a 78 y.o. male coming in with complaint of burning in both quads. Constant burning.  Also having cramps in legs and hands. Seems to be getting worse recently.   Is taking medicine for gout with [name] change in symptoms.  Patient has had spinal stenosis previously.  Patient has been seen before.  Patient bilateral lower extremities did seem to respond well to gabapentin as well as Pletal.  Patient did have though an endarterectomy and has had stenting previously secondary to peripheral vascular disease.  Also right inguinal hernia.    Past Medical History:  Diagnosis Date  . Abdominal pain, unspecified site 10/16/2009  . Anal fissure 04/24/2008  . Arthritis   . B12 deficiency 10/11/2014  . BACK PAIN 01/10/2009  . Barrett's esophagus 1997  . BARRETTS ESOPHAGUS 08/16/2007  . BREAST HYPERTROPHY 02/16/2009  . CARBUNCLE, NECK 02/14/2008  . Cataract   . DIVERTICULOSIS, COLON 06/28/2007  . Full dentures   . GERD 06/28/2007  . GLUCOSE INTOLERANCE 08/15/2008  . Headache(784.0) 11/13/2009  . HERNIA, UMBILICAL W/OBSTRUCTION W/O GANGRENE 06/28/2007  . HTN (hypertension) 07/12/2012  . HYPERLIPIDEMIA 06/28/2007  . HYPERTENSION 06/28/2007  . Impaired glucose tolerance 04/13/2011  . INSOMNIA-SLEEP DISORDER-UNSPEC 10/10/2008  . LEG PAIN,  BILATERAL 02/16/2009  . LOW BACK PAIN 06/28/2007  . MASTITIS 01/10/2009  . NECK PAIN 05/31/2008  . NEPHROLITHIASIS, HX OF 06/28/2007  . PROCTOSIGMOIDITIS, ULCERATIVE 1997  . PVD (peripheral vascular disease) with claudication, lifestyle limiting, ABI rt. 0.67, lt. 0.73 07/12/2012  . S/P angioplasty with stent, after DB athrectomy to Rt. ext. iliac and Rt. SFA 07/12/2012  . Skin cancer    right hand  . Spinal stenosis at L4-L5 level   . Stenosis of left carotid artery, mod to severe by dopplers 09/29/2012  . TESTICULAR PAIN, RIGHT 10/25/2010  . VITAMIN D DEFICIENCY 11/13/2009   Past Surgical History:  Procedure Laterality Date  . ABDOMINAL AORTOGRAM N/A 05/13/2018   Procedure: ABDOMINAL AORTOGRAM;  Surgeon: [name], MD;  Location: [facility];  Service: Cardiovascular;  Laterality: N/A;  . anal fissure surgury     pt unsure of this  . ATHERECTOMY  09/28/2012  . ATHERECTOMY N/A 07/12/2012   Procedure: ATHERECTOMY;  Surgeon: [name], MD;  Location: [facility] CATH [facility];  Service: Cardiovascular;  Laterality: N/A;  . ATHERECTOMY N/A 09/28/2012   Procedure: ATHERECTOMY;  Surgeon: [name], MD;  Location: [facility] CATH LAB;  Service: Cardiovascular;  Laterality: N/A;  . bilateral inguinal hernia with mesh and umbilical hernia repairs  09/2007  . CATARACT EXTRACTION Bilateral    bilateral  . COLONOSCOPY    . ENDARTERECTOMY FEMORAL Right 07/19/2018   Procedure: ENDARTERECTOMY RIGHT FEMORAL ARTERY;  Surgeon: [name], MD;  Location: [facility];  Service: Vascular;  Laterality: Right;  . GROIN  DISSECTION Right 07/20/2013   Procedure: GROIN EXPLORATION, EXCISIONAL BIOPSY RIGHT INGUINAL LYMPH NODES;  Surgeon: Harl Bowie, MD;  Location: [facility];  Service: General;  Laterality: Right;  . HERNIA REPAIR    . left knee arthroscopy    . LOWER EXTREMITY ANGIOGRAPHY Bilateral 05/13/2018   Procedure: Lower Extremity Angiography;  Surgeon: [name],  MD;  Location: [facility];  Service: Cardiovascular;  Laterality: Bilateral;  . LUMBAR LAMINECTOMY/DECOMPRESSION MICRODISCECTOMY N/A 11/05/2016   Procedure: Microlumbar decompression L2-3, L3-4, L4-5, lateral mass fusion L4-5;  Surgeon: Susa Day, MD;  Location: [facility];  Service: Orthopedics;  Laterality: N/A;  . PATCH ANGIOPLASTY Right 07/19/2018   Procedure: RIGHT FEMORAL PATCH ANGIOPLASTY WITH XENOSURE BIOLOGIC PATCH;  Surgeon: [name], MD;  Location: [facility];  Service: Vascular;  Laterality: Right;  . PERCUTANEOUS STENT INTERVENTION  09/28/2012   Procedure: PERCUTANEOUS STENT INTERVENTION;  Surgeon: [name], MD;  Location: [facility] C[facility] LAB;  Service: Cardiovascular;;  . PERIPHERAL VASCULAR INTERVENTION  05/13/2018   Procedure: PERIPHERAL VASCULAR INTERVENTION;  Surgeon: [name], MD;  Location: [facility];  Service: Cardiovascular;;  Ext. Iliac  . UPPER GI ENDOSCOPY     Social History   Socioeconomic History  . Marital status: Divorced    Spouse name: Not on file  . Number of children: 3  . Years of education: Not on file  . Highest education level: Not on file  Occupational History  . Occupation: retired Investment banker, operational  . Financial resource strain: Not on file  . Food insecurity    Worry: Not on file    Inability: Not on file  . Transportation needs    Medical: Not on file    Non-medical: Not on file  Tobacco Use  . Smoking status: Former Smoker    Quit date: 09/08/1972    Years since quitting: 46.6  . Smokeless tobacco: Former Systems developer    Types: Chew  Substance and Sexual Activity  . Alcohol use: Not Currently    Alcohol/week: 5.0 standard drinks    Types: 5 Cans of beer per week  . Drug use: [name]  . Sexual activity: Not Currently    Partners: Female  Lifestyle  . Physical activity    Days per week: Not on file    Minutes per session: Not on file  . Stress: Not on file  Relationships  . Social Herbalist  on phone: Not on file    Gets together: Not on file    Attends religious service: Not on file    Active member of club or organization: Not on file    Attends meetings of clubs or organizations: Not on file    Relationship status: Not on file  Other Topics Concern  . Not on file  Social History Narrative   Daily caffeine 1 cup coffee daily   Allergies  Allergen Reactions  . Wasp Venom Anaphylaxis  . Contrast Media [Iodinated Diagnostic Agents] Other (See Comments)    Break out in welts  . Statins Other (See Comments)    Legs pain: Atorvastatin and Lovastatin   Family History  Problem Relation Age of Onset  . COPD Mother   . Cerebral aneurysm Mother   . COPD Father   . Colon cancer Neg Hx   . Esophageal cancer Neg Hx   . Rectal cancer Neg Hx   . Stomach cancer Neg Hx   . Colon  polyps Neg Hx     Current Outpatient Medications (Endocrine & Metabolic):  .  predniSONE (DELTASONE) 10 MG tablet, Take 2 tablets (20 mg total) by mouth daily.   Current Outpatient Medications (Cardiovascular):  .  lisinopril-hydrochlorothiazide (PRINZIDE,ZESTORETIC) 20-25 MG tablet, TAKE 1 TABLET BY MOUTH  DAILY   Current Outpatient Medications (Respiratory):  .  albuterol (VENTOLIN HFA) 108 (90 Base) MCG/ACT inhaler, Inhale 2 puffs into the lungs every 6 (six) hours as needed for wheezing or shortness of breath. .  budesonide-formoterol (SYMBICORT) 160-4.5 MCG/ACT inhaler, Inhale 2 puffs into the lungs 2 (two) times daily.   Current Outpatient Medications (Analgesics):  .  allopurinol (ZYLOPRIM) 100 MG tablet, Take 2 tablets (200 mg total) by mouth daily.   Current Outpatient Medications (Hematological):  .  clopidogrel (PLAVIX) 75 MG tablet, TAKE 1 TABLET BY MOUTH  DAILY (Patient taking differently: Take 75 mg by mouth daily. ) .  cyanocobalamin 1000 MCG tablet, Take 1,000 mcg by mouth daily.    Current Outpatient Medications (Other):  .  mesalamine (APRISO) 0.375 g 24 hr capsule, Take 4  capsules (1.5 g total) by mouth daily. Take 4 tablets all at one time. .  pantoprazole (PROTONIX) 40 MG tablet, Take 1 tablet (40 mg total) by mouth daily. [name]  zolpidem (AMBIEN) 10 MG tablet, TAKE 1 TABLET BY MOUTH AT BEDTIME AS NEEDED FOR SLEEP. (Patient taking differently: Take 10 mg by mouth at bedtime. ) .  gabapentin (NEURONTIN) 100 MG capsule, Take 1 capsule (100 mg total) by mouth at bedtime.  Current Facility-Administered Medications (Other):  .  0.9 %  sodium chloride infusion    Past medical history, social, surgical and family history all reviewed in electronic medical record.  [name] pertanent information unless stated regarding to the chief complaint.   Review of Systems:  [name] headache, visual changes, nausea, vomiting, diarrhea, constipation, dizziness, abdominal pain, skin rash, fevers, chills, night sweats, weight loss, swollen lymph nodes, chest pain, shortness of breath, mood changes.  Positive muscle aches, body aches  Objective  Blood pressure 126/66, pulse (!) 44, height 5' 8"  (1.727 m), weight 236 lb (107 kg), SpO2 99 %.    General: [name] apparent distress alert and oriented x3 mood and affect normal, dressed appropriately.  Patient is quite hard of hearing HEENT: Pupils equal, extraocular movements intact  Respiratory: Patient's speak in full sentences and does not appear short of breath  Cardiovascular: Trace lower extremity edema, non tender, [name] erythema  Skin: Warm dry intact with [name] signs of infection or rash on extremities or on axial skeleton.  Abdomen: Soft nontender  Neuro: Cranial nerves II through XII are intact, neurovascularly intact in all extremities  Lymph: [name] lymphadenopathy of posterior or anterior cervical chain or axillae bilaterally.  Gait antalgic MSK:  tender with limited range of motion and good stability and symmetric strength and tone of shoulders, elbows, wrist, hip, knee and ankles bilaterally.  Patient does still have some trace effusion of the  lower extremities bilaterally hemosiderin deposits noted.  1+ deep tendon reflexes as well as 1+ dorsalis pedis pulses right and left.  Patient has significant tightness with straight leg test.  Patient is unable to do [place] test secondary to pain and stiffness.  Mild discomfort to paraspinal musculature palpation.   Impression and Recommendations:     This case required medical decision making of moderate complexity. The above documentation has been reviewed and is accurate and complete [name] Pulley, DO  Note: This dictation was prepared with Dragon dictation along with smaller phrase technology. Any transcriptional errors that result from this process are unintentional.

## 2019-05-02 ENCOUNTER — Telehealth: Payer: Self-pay

## 2019-05-02 DIAGNOSIS — R1031 Right lower quadrant pain: Secondary | ICD-10-CM | POA: Diagnosis not present

## 2019-05-02 DIAGNOSIS — G8929 Other chronic pain: Secondary | ICD-10-CM | POA: Diagnosis not present

## 2019-05-02 NOTE — Telephone Encounter (Signed)
   Primary Cardiologist:Jonathan Gwenlyn Found, MD  Chart reviewed as part of pre-operative protocol coverage. Because of [name] [name]'s past medical history and time since last visit, he/she will require a follow-up visit in order to better assess preoperative cardiovascular risk.  Patient hasn't been seen since 2016.   Pre-op covering staff: - Please schedule appointment and call patient to inform them. - Please contact requesting surgeon's office via preferred method (i.e, phone, fax) to inform them of need for appointment prior to surgery.  If applicable, this message will also be routed to pharmacy pool and/or primary cardiologist for input on holding anticoagulant/antiplatelet agent as requested below so that this information is available at time of patient's appointment.   Boykin, PA  05/02/2019, 5:03 PM

## 2019-05-02 NOTE — Telephone Encounter (Signed)
   [facility] Pre-operative Risk Assessment    Request for surgical clearance:  1. What type of surgery is being performed? LUMBAR ESI   2. When is this surgery scheduled? TBD   3. What type of clearance is required (medical clearance vs. Pharmacy clearance to hold med vs. Both)? BOTH  4. Are there any medications that need to be held prior to surgery and how long?  PLAVIX 5 DAYS PRIOR   5. Practice name and name of physician performing surgery?    [facility]  ATTN:ROBERTA  6. What is your office phone number  [phone]    7.   What is your office fax number  [phone]  8.   Anesthesia type (None, local, MAC, general) ? NOT LISTED   [name] [name] 05/02/2019, 4:27 PM  _________________________________________________________________   (provider comments below)

## 2019-05-03 ENCOUNTER — Other Ambulatory Visit: Payer: Self-pay | Admitting: Surgery

## 2019-05-03 ENCOUNTER — Other Ambulatory Visit (HOSPITAL_COMMUNITY): Payer: Self-pay | Admitting: Surgery

## 2019-05-03 DIAGNOSIS — R1031 Right lower quadrant pain: Secondary | ICD-10-CM

## 2019-05-03 DIAGNOSIS — G8929 Other chronic pain: Secondary | ICD-10-CM

## 2019-05-03 NOTE — Telephone Encounter (Signed)
Faxed to requesting office vis Epic.

## 2019-05-03 NOTE — Telephone Encounter (Signed)
Spoke to pt daughter, Olegario Messier. Informed of need for pt to see Dr. Gwenlyn Found for clearance. Pt daughter stated he does not want to see the doctor for clearance because he cannot keep paying for the visits since he is on social security. Pt daughter stated she told him to think about it for a day before saying no to the appt. Informed pt daughter to have him call if he would like to schedule appt. She verbalized thanks and understanding.

## 2019-05-03 NOTE — Telephone Encounter (Signed)
Pt has a virtual visit appt with Dr. Gwenlyn Found tomorrow, 05/04/19. Dr. Gwenlyn Found will provide guidance for clearance and holding plavix.   Dr. Gwenlyn Found, please let the preop pool know if you would like for [place] to complete the clearance form and fax the requesting office for you after your visit.

## 2019-05-04 ENCOUNTER — Telehealth: Payer: Self-pay

## 2019-05-04 ENCOUNTER — Telehealth (INDEPENDENT_AMBULATORY_CARE_PROVIDER_SITE_OTHER): Payer: Medicare Other | Admitting: Cardiovascular Disease

## 2019-05-04 ENCOUNTER — Other Ambulatory Visit: Payer: Self-pay

## 2019-05-04 VITALS — Ht 68.0 in | Wt 230.0 lb

## 2019-05-04 DIAGNOSIS — I10 Essential (primary) hypertension: Secondary | ICD-10-CM | POA: Diagnosis not present

## 2019-05-04 DIAGNOSIS — I6529 Occlusion and stenosis of unspecified carotid artery: Secondary | ICD-10-CM | POA: Diagnosis not present

## 2019-05-04 DIAGNOSIS — M79604 Pain in right leg: Secondary | ICD-10-CM

## 2019-05-04 DIAGNOSIS — E782 Mixed hyperlipidemia: Secondary | ICD-10-CM | POA: Diagnosis not present

## 2019-05-04 DIAGNOSIS — I739 Peripheral vascular disease, unspecified: Secondary | ICD-10-CM | POA: Diagnosis not present

## 2019-05-04 DIAGNOSIS — M79605 Pain in left leg: Secondary | ICD-10-CM

## 2019-05-04 NOTE — Telephone Encounter (Signed)
I started a pa for the repatha for this patient [name] [name] (Key: [id number]) Repatha SureClick 295FM/BB auto-injectors   Form [employer] Part D Electronic Prior Authorization Form (2017 NCP[place]) Created 3 minutes ago Sent to Plan 1 minute ago Determination Wait for Questions [employer] 2017 NCP[employer] typically responds with questions in less than 15 minutes, but may take up to 24 hours.

## 2019-05-04 NOTE — Patient Instructions (Addendum)
Medication Instructions:  Your physician recommends that you continue on your current medications as directed. Please refer to the Current Medication list given to you today.  If you need a refill on your cardiac medications before your next appointment, please call your pharmacy.   Lab work: NONE If you have labs (blood work) drawn today and your tests are completely normal, you will receive your results only by: [name] MyChart Message (if you have MyChart) OR . A paper copy in the mail If you have any lab test that is abnormal or we need to change your treatment, we will call you to review the results.  Testing/Procedures: Your physician has requested that you have a carotid duplex. This test is an ultrasound of the carotid arteries in your neck. It looks at blood flow through these arteries that supply the brain with blood. Allow one hour for this exam. There are no restrictions or special instructions. TO BE SCHEDULED. YOU WILL BE CONTACTED BY A SCHEDULER TO SET UP THIS APPOINTMENT.   Follow-Up: At [facility], you and your health needs are our priority.  As part of our continuing mission to provide you with exceptional heart care, we have created designated Provider Care Teams.  These Care Teams include your primary Cardiologist (physician) and Advanced Practice Providers (APPs -  Physician Assistants and Nurse Practitioners) who all work together to provide you with the care you need, when you need it. You will need a follow up appointment in 12 months with Dr. Quay Burow.  Please call our office 2 months in advance to schedule this/each appointment.   Any Other Special Instructions Will Be Listed Below (If Applicable). Dr. Gwenlyn Found has recommended that you hold your Plavix 5 days prior to your procedure  You will be contacted by a clinical pharmacist from our office to discuss your candidacy for Repatha, a medication that helps to lower "bad" cholesterol.

## 2019-05-04 NOTE — Telephone Encounter (Signed)
Okay to hold Plavix

## 2019-05-04 NOTE — Telephone Encounter (Signed)
Dr. Gwenlyn Found, Is he cleared for surgery? Or does he need to wait for carotid US to be completed?

## 2019-05-04 NOTE — Progress Notes (Signed)
Virtual Visit via Telephone Note   This visit type was conducted due to national recommendations for restrictions regarding the COVID-19 Pandemic (e.g. social distancing) in an effort to limit this patient's exposure and mitigate transmission in our community.  Due to his co-morbid illnesses, this patient is at least at moderate risk for complications without adequate follow up.  This format is felt to be most appropriate for this patient at this time.  The patient did not have access to video technology/had technical difficulties with video requiring transitioning to audio format only (telephone).  All issues noted in this document were discussed and addressed.  No physical exam could be performed with this format.  Please refer to the patient's chart for his  consent to telehealth for [facility].   Date:  05/04/2019   ID:  [name] [name], DOB June 06, 1941, MRN [id number]  Patient Location: Home Provider Location: Home  PCP:  Biagio Borg, MD  Cardiologist:  Quay Burow, MD  Electrophysiologist:  None   Evaluation Performed:  Follow-Up Visit  Chief Complaint: Follow-up peripheral arterial disease and hypertension, hyperlipidemia  History of Present Illness:    [name] [name] is a 78 y.o. mildly overweight, divorced Caucasian male, father of 77, grandfather to 5 grandchildren who I last saw in the office 12 months ago.I initially saw him June 28, 2012, for lifestyle-limiting claudication.  I last saw him in the office 05/29/2015.  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 He had Dopplers in our office that suggested bilateral iliac and SFA disease. He had a negative Myoview and carotid Dopplers that did show moderately severe left ICA stenosis done because of an auscultated bruit. I angiogram'd him on July 12, 2012, and put a stent in his right external iliac as well as 2 overlapping self-expanding stents in  his mid right SFA. He had 3-vessel runoff. Unfortunately, he had some thrombus form and needed to undergo aspiration thrombectomy administration of intravenous Integrilin which resulted in improvement in his blood flow. He did stay overnight in the ICU and was discharged home. Followup Dopplers showed normal ABIs on the right and he said he did have clinical improvement but had left calf claudication with angiographically documented 80% mid left SFA stenosis. On September 28, 2012, he underwent Diamondback orbital rotational atherectomy, PTA and stenting of his mid left SFA with an excellent result.  Since I saw him in the office 4 years ago he did undergo a peripheral angiogram by Dr. Fortunato Curling 05/13/2018 and had intervention.  Apparently he was referred to him by Dr. Jenny Reichmann.  He does have COPD and gets occasional shortness of breath but denies chest pain.  He is sheltering in place and socially distancing.   The patient does not have symptoms concerning for COVID-19 infection (fever, chills, cough, or new shortness of breath).    Past Medical History:  Diagnosis Date   Abdominal pain, unspecified site 10/16/2009   Anal fissure 04/24/2008   Arthritis    B12 deficiency 10/11/2014   BACK PAIN 01/10/2009   Barrett's esophagus 1997   BARRETTS ESOPHAGUS 08/16/2007   BREAST HYPERTROPHY 02/16/2009   CARBUNCLE, NECK 02/14/2008   Cataract    DIVERTICULOSIS, COLON 06/28/2007   Full dentures    GERD 06/28/2007   GLUCOSE INTOLERANCE 08/15/2008   Headache(784.0) 11/15/7671   HERNIA, UMBILICAL W/OBSTRUCTION W/O GANGRENE 06/28/2007   HTN (hypertension) 07/12/2012   HYPERLIPIDEMIA 06/28/2007   HYPERTENSION 06/28/2007  Impaired glucose tolerance 04/13/2011   INSOMNIA-SLEEP DISORDER-UNSPEC 10/10/2008   LEG PAIN, BILATERAL 02/16/2009   LOW BACK PAIN 06/28/2007   MASTITIS 01/10/2009   NECK PAIN 05/31/2008   NEPHROLITHIASIS, HX OF 06/28/2007   PROCTOSIGMOIDITIS, ULCERATIVE 1997   PVD (peripheral  vascular disease) with claudication, lifestyle limiting, ABI rt. 0.67, lt. 0.73 07/12/2012   S/P angioplasty with stent, after DB athrectomy to Rt. ext. iliac and Rt. SFA 07/12/2012   Skin cancer    right hand   Spinal stenosis at L4-L5 level    Stenosis of left carotid artery, mod to severe by dopplers 09/29/2012   TESTICULAR PAIN, RIGHT 10/25/2010   VITAMIN D DEFICIENCY 11/13/2009   Past Surgical History:  Procedure Laterality Date   ABDOMINAL AORTOGRAM N/A 05/13/2018   Procedure: ABDOMINAL AORTOGRAM;  Surgeon: [name], MD;  Location: [facility];  Service: Cardiovascular;  Laterality: N/A;   anal fissure surgury     pt unsure of this   ATHERECTOMY  09/28/2012   ATHERECTOMY N/A 07/12/2012   Procedure: ATHERECTOMY;  Surgeon: [name], MD;  Location: [facility] CATH LAB;  Service: Cardiovascular;  Laterality: N/A;   ATHERECTOMY N/A 09/28/2012   Procedure: ATHERECTOMY;  Surgeon: [name], MD;  Location: [facility] CATH LAB;  Service: Cardiovascular;  Laterality: N/A;   bilateral inguinal hernia with mesh and umbilical hernia repairs  09/2007   CATARACT EXTRACTION Bilateral    bilateral   COLONOSCOPY     ENDARTERECTOMY FEMORAL Right 07/19/2018   Procedure: ENDARTERECTOMY RIGHT FEMORAL ARTERY;  Surgeon: [name], MD;  Location: [place];  Service: Vascular;  Laterality: Right;   GROIN DISSECTION Right 07/20/2013   Procedure: GROIN EXPLORATION, EXCISIONAL BIOPSY RIGHT INGUINAL LYMPH NODES;  Surgeon: Harl Bowie, MD;  Location: [place];  Service: General;  Laterality: Right;   HERNIA REPAIR     left knee arthroscopy     LOWER EXTREMITY ANGIOGRAPHY Bilateral 05/13/2018   Procedure: Lower Extremity Angiography;  Surgeon: [name], MD;  Location: [place] [facility];  Service: Cardiovascular;  Laterality: Bilateral;   LUMBAR LAMINECTOMY/DECOMPRESSION MICRODISCECTOMY N/A 11/05/2016   Procedure: Microlumbar decompression  L2-3, L3-4, L4-5, lateral mass fusion L4-5;  Surgeon: Susa Day, MD;  Location: [facility];  Service: Orthopedics;  Laterality: N/A;   PATCH ANGIOPLASTY Right 07/19/2018   Procedure: RIGHT FEMORAL PATCH ANGIOPLASTY WITH XENOSURE BIOLOGIC PATCH;  Surgeon: [name], MD;  Location: [place];  Service: Vascular;  Laterality: Right;   PERCUTANEOUS STENT INTERVENTION  09/28/2012   Procedure: PERCUTANEOUS STENT INTERVENTION;  Surgeon: [name], MD;  Location: [facility] C[facility] LAB;  Service: Cardiovascular;;   PERIPHERAL VASCULAR INTERVENTION  05/13/2018   Procedure: PERIPHERAL VASCULAR INTERVENTION;  Surgeon: [name], MD;  Location: [place] [facility];  Service: Cardiovascular;;  Ext. Iliac   UPPER GI ENDOSCOPY       Current Meds  Medication Sig   albuterol (VENTOLIN HFA) 108 (90 Base) MCG/ACT inhaler Inhale 2 puffs into the lungs every 6 (six) hours as needed for wheezing or shortness of breath.   allopurinol (ZYLOPRIM) 100 MG tablet Take 2 tablets (200 mg total) by mouth daily.   budesonide-formoterol (SYMBICORT) 160-4.5 MCG/ACT inhaler Inhale 2 puffs into the lungs 2 (two) times daily.   clopidogrel (PLAVIX) 75 MG tablet TAKE 1 TABLET BY MOUTH  DAILY (Patient taking differently: Take 75 mg by mouth daily. )   cyanocobalamin 1000 MCG tablet Take 1,000 mcg by mouth daily.  gabapentin (NEURONTIN) 100 MG capsule Take 1 capsule (100 mg total) by mouth at bedtime.   lisinopril-hydrochlorothiazide (PRINZIDE,ZESTORETIC) 20-25 MG tablet TAKE 1 TABLET BY MOUTH  DAILY   mesalamine (APRISO) 0.375 g 24 hr capsule Take 4 capsules (1.5 g total) by mouth daily. Take 4 tablets all at one time.   pantoprazole (PROTONIX) 40 MG tablet Take 1 tablet (40 mg total) by mouth daily.   zolpidem (AMBIEN) 10 MG tablet TAKE 1 TABLET BY MOUTH AT BEDTIME AS NEEDED FOR SLEEP. (Patient taking differently: Take 10 mg by mouth at bedtime. )   [DISCONTINUED] predniSONE (DELTASONE) 10 MG tablet Take  2 tablets (20 mg total) by mouth daily.   Current Facility-Administered Medications for the 05/04/19 encounter (Telemedicine) with [name], MD  Medication   0.9 %  sodium chloride infusion     Allergies:   Wasp venom, Contrast media [iodinated diagnostic agents], and Statins   Social History   Tobacco Use   Smoking status: Former Smoker    Quit date: 09/08/1972    Years since quitting: 46.6   Smokeless tobacco: Former Systems developer    Types: Chew  Substance Use Topics   Alcohol use: Not Currently    Alcohol/week: 5.0 standard drinks    Types: 5 Cans of beer per week   Drug use: No     Family Hx: The patient's family history includes COPD in his father and mother; Cerebral aneurysm in his mother. There is no history of Colon cancer, Esophageal cancer, Rectal cancer, Stomach cancer, or Colon polyps.  ROS:   Please see the history of present illness.     All other systems reviewed and are negative.   Prior CV studies:   The following studies were reviewed today:  Lower extremity arterial Doppler studies performed 11/09/2018  Labs/Other Tests and Data Reviewed:    EKG:  No ECG reviewed.  Recent Labs: 02/07/2019: ALT 12; BUN 12; Creatinine, Ser 1.01; Hemoglobin 15.4; Platelets 249.0; Potassium 5.2; Pro B Natriuretic peptide (BNP) 57.0; Sodium 138; TSH 3.20   Recent Lipid Panel Lab Results  Component Value Date/Time   CHOL 181 02/07/2019 09:54 AM   TRIG 104.0 02/07/2019 09:54 AM   HDL 33.50 (L) 02/07/2019 09:54 AM   CHOLHDL 5 02/07/2019 09:54 AM   LDLCALC 127 (H) 02/07/2019 09:54 AM   LDLDIRECT 184.7 10/14/2013 09:54 AM    Wt Readings from Last 3 Encounters:  05/04/19 230 lb (104.3 kg)  04/29/19 236 lb (107 kg)  04/07/19 232 lb (105.2 kg)     Objective:    Vital Signs:  Ht 5' 8"  (1.727 m)    Wt 230 lb (104.3 kg)    BMI 34.97 kg/m    VITAL SIGNS:  reviewed the patient was unable to check his vital signs at home today.  A full physical exam was not performed  since this was a virtual telemedicine phone visit.  ASSESSMENT & PLAN:    1. Peripheral arterial disease- history of PAD status post right external iliac artery and SFA intervention by myself 07/12/2012 followed by staged left SFA intervention using diamondback orbital rotational arthrectomy, PTA and stenting 09/28/2012.  His Dopplers after that improved as did his claudication.  He was referred to Dr. Fortunato Curling, vascular surgery, and had peripheral angiography performed 05/13/2018 with iliac intervention.  Follow-up Dopplers performed 11/09/2018 ordered by Dr. Carlis Abbott revealed a right ABI 0.79 and a left ABI 0.74. 2. Essential hypertension- history of essential hypertension on lisinopril/hydrochlorothiazide.  He was unable  to check his vital signs this morning. 3. Hyperlipidemia- history of hyperlipidemia intolerant to statin therapy with lipid profile performed 02/07/2019 revealing total cholesterol 181, LDL of 127 and HDL of 33.  He may be a candidate for Repatha.  We will follow this up. 4. Carotid artery disease- history of moderate left ICA stenosis by duplex ultrasound 06/13/2015.  We will have this rechecked  COVID-19 Education: The signs and symptoms of COVID-19 were discussed with the patient and how to seek care for testing (follow up with PCP or arrange E-visit).  The importance of social distancing was discussed today.  Time:   Today, I have spent 6 minutes with the patient with telehealth technology discussing the above problems.     Medication Adjustments/Labs and Tests Ordered: Current medicines are reviewed at length with the patient today.  Concerns regarding medicines are outlined above.   Tests Ordered: No orders of the defined types were placed in this encounter.   Medication Changes: No orders of the defined types were placed in this encounter.   Follow Up:  In Person in 1 year(s)  Signed, Quay Burow, MD  05/04/2019 10:07 AM    [place]

## 2019-05-05 NOTE — Telephone Encounter (Signed)
Cleared for surgery.  Does not have to wait for carotid Dopplers

## 2019-05-06 NOTE — Telephone Encounter (Signed)
   Primary Cardiologist: Quay Burow, MD  Chart reviewed as part of pre-operative protocol coverage. Given past medical history and time since last visit, based on ACC/AHA guidelines, TANNEN VANDEZANDE would be at acceptable risk for the planned procedure without further cardiovascular testing.   I will route this recommendation to the requesting party via Epic fax function and remove from pre-op pool.  Please call with questions. Ok to hold plavix for 5 days prior to the procedure and restart as soon as possible after the procedure.   [place], [place] 05/06/2019, 9:00 AM

## 2019-05-06 NOTE — Telephone Encounter (Addendum)
Left a detailed message on the mobile number listed for the patient's daughter [name] about her father [name] is to hold his Plavix for 5 days prior to the procedure and to restart it as soon as possible afterwards and to give our office a call if there are any questions.

## 2019-05-09 ENCOUNTER — Other Ambulatory Visit: Payer: Self-pay | Admitting: Surgery

## 2019-05-09 DIAGNOSIS — R1031 Right lower quadrant pain: Secondary | ICD-10-CM

## 2019-05-09 DIAGNOSIS — G8929 Other chronic pain: Secondary | ICD-10-CM

## 2019-05-10 ENCOUNTER — Telehealth: Payer: Self-pay

## 2019-05-10 ENCOUNTER — Ambulatory Visit
Admission: RE | Admit: 2019-05-10 | Discharge: 2019-05-10 | Disposition: A | Discharge status: Home / Self Care | Payer: Medicare Other | Source: Ambulatory Visit | Attending: Surgery | Admitting: Surgery

## 2019-05-10 ENCOUNTER — Other Ambulatory Visit: Payer: Self-pay

## 2019-05-10 DIAGNOSIS — G8929 Other chronic pain: Secondary | ICD-10-CM

## 2019-05-10 DIAGNOSIS — K5730 Diverticulosis of large intestine without perforation or abscess without bleeding: Secondary | ICD-10-CM | POA: Diagnosis not present

## 2019-05-10 DIAGNOSIS — R1031 Right lower quadrant pain: Secondary | ICD-10-CM

## 2019-05-10 NOTE — Telephone Encounter (Signed)
Spoke with patient's daughter, [name], to let her know pt's 13hr prep has been called in to [facility] [phone]).  She was informed he needs to take Prednisone 44m PO on 05/17/19 at 0100, 0700 and 1300, as well as Benadryl 570mPO 9/8 @ 1300.

## 2019-05-10 NOTE — Telephone Encounter (Signed)
Patient's daughter Arrie Aran called. She would like to attend appointment with father on 21st. Oked her to come with him. Also wanted to know how the epidural works. Explained process. Patient voices understanding.

## 2019-05-11 ENCOUNTER — Ambulatory Visit (HOSPITAL_COMMUNITY)
Admission: RE | Admit: 2019-05-11 | Discharge: 2019-05-11 | Disposition: A | Discharge status: Home / Self Care | Payer: Medicare Other | Source: Ambulatory Visit | Attending: Cardiology | Admitting: Cardiology

## 2019-05-11 ENCOUNTER — Other Ambulatory Visit (HOSPITAL_COMMUNITY): Payer: Self-pay | Admitting: Cardiovascular Disease

## 2019-05-11 DIAGNOSIS — I6529 Occlusion and stenosis of unspecified carotid artery: Secondary | ICD-10-CM | POA: Diagnosis not present

## 2019-05-11 DIAGNOSIS — I6523 Occlusion and stenosis of bilateral carotid arteries: Secondary | ICD-10-CM

## 2019-05-12 ENCOUNTER — Other Ambulatory Visit: Payer: Self-pay | Admitting: *Deleted

## 2019-05-12 DIAGNOSIS — I6529 Occlusion and stenosis of unspecified carotid artery: Secondary | ICD-10-CM

## 2019-05-17 ENCOUNTER — Inpatient Hospital Stay: Admission: RE | Admit: 2019-05-17 | Payer: Medicare Other | Source: Ambulatory Visit

## 2019-05-19 ENCOUNTER — Other Ambulatory Visit: Payer: Self-pay

## 2019-05-19 ENCOUNTER — Ambulatory Visit
Admission: RE | Admit: 2019-05-19 | Discharge: 2019-05-19 | Disposition: A | Discharge status: Home / Self Care | Payer: Medicare Other | Source: Ambulatory Visit | Attending: Family Medicine | Admitting: Family Medicine

## 2019-05-19 DIAGNOSIS — M47816 Spondylosis without myelopathy or radiculopathy, lumbar region: Secondary | ICD-10-CM | POA: Diagnosis not present

## 2019-05-19 DIAGNOSIS — M5416 Radiculopathy, lumbar region: Secondary | ICD-10-CM

## 2019-05-19 MED ORDER — METHYLPREDNISOLONE ACETATE 40 MG/ML INJ SUSP (RADIOLOG
120.0000 mg | Freq: Once | INTRAMUSCULAR | Status: AC
  Administered 2019-05-19: 12:00:00 120 mg via EPIDURAL

## 2019-05-19 MED ORDER — IOPAMIDOL (ISOVUE-M 200) INJECTION 41%
1.0000 mL | Freq: Once | INTRAMUSCULAR | Status: AC
  Administered 2019-05-19: 1 mL via EPIDURAL

## 2019-05-19 NOTE — Discharge Instructions (Signed)

## 2019-05-23 DIAGNOSIS — R3915 Urgency of urination: Secondary | ICD-10-CM | POA: Diagnosis not present

## 2019-05-23 DIAGNOSIS — R1031 Right lower quadrant pain: Secondary | ICD-10-CM | POA: Diagnosis not present

## 2019-05-23 DIAGNOSIS — G8929 Other chronic pain: Secondary | ICD-10-CM | POA: Diagnosis not present

## 2019-05-27 ENCOUNTER — Ambulatory Visit: Payer: Self-pay | Admitting: *Deleted

## 2019-05-27 ENCOUNTER — Encounter (HOSPITAL_COMMUNITY): Payer: Self-pay

## 2019-05-27 ENCOUNTER — Inpatient Hospital Stay (HOSPITAL_COMMUNITY)
Admission: EM | Admit: 2019-05-27 | Discharge: 2019-06-06 | DRG: 177 | Disposition: A | Discharge status: Home Health | Payer: Medicare Other | Attending: Internal Medicine | Admitting: Internal Medicine

## 2019-05-27 ENCOUNTER — Other Ambulatory Visit: Payer: Self-pay

## 2019-05-27 DIAGNOSIS — I10 Essential (primary) hypertension: Secondary | ICD-10-CM | POA: Diagnosis not present

## 2019-05-27 DIAGNOSIS — N289 Disorder of kidney and ureter, unspecified: Secondary | ICD-10-CM | POA: Diagnosis present

## 2019-05-27 DIAGNOSIS — J1282 Pneumonia due to coronavirus disease 2019: Secondary | ICD-10-CM

## 2019-05-27 DIAGNOSIS — R197 Diarrhea, unspecified: Secondary | ICD-10-CM | POA: Diagnosis not present

## 2019-05-27 DIAGNOSIS — J9601 Acute respiratory failure with hypoxia: Secondary | ICD-10-CM | POA: Diagnosis not present

## 2019-05-27 DIAGNOSIS — J069 Acute upper respiratory infection, unspecified: Secondary | ICD-10-CM | POA: Diagnosis present

## 2019-05-27 DIAGNOSIS — J440 Chronic obstructive pulmonary disease with acute lower respiratory infection: Secondary | ICD-10-CM | POA: Diagnosis present

## 2019-05-27 DIAGNOSIS — E860 Dehydration: Secondary | ICD-10-CM | POA: Diagnosis present

## 2019-05-27 DIAGNOSIS — U071 COVID-19: Secondary | ICD-10-CM | POA: Diagnosis not present

## 2019-05-27 DIAGNOSIS — R740 Nonspecific elevation of levels of transaminase and lactic acid dehydrogenase [LDH]: Secondary | ICD-10-CM | POA: Diagnosis not present

## 2019-05-27 DIAGNOSIS — E871 Hypo-osmolality and hyponatremia: Secondary | ICD-10-CM | POA: Diagnosis not present

## 2019-05-27 DIAGNOSIS — Z91041 Radiographic dye allergy status: Secondary | ICD-10-CM

## 2019-05-27 DIAGNOSIS — R5383 Other fatigue: Secondary | ICD-10-CM | POA: Diagnosis not present

## 2019-05-27 DIAGNOSIS — K2270 Barrett's esophagus without dysplasia: Secondary | ICD-10-CM | POA: Diagnosis present

## 2019-05-27 DIAGNOSIS — E876 Hypokalemia: Secondary | ICD-10-CM | POA: Diagnosis not present

## 2019-05-27 DIAGNOSIS — Z7902 Long term (current) use of antithrombotics/antiplatelets: Secondary | ICD-10-CM

## 2019-05-27 DIAGNOSIS — J1289 Other viral pneumonia: Secondary | ICD-10-CM | POA: Diagnosis present

## 2019-05-27 DIAGNOSIS — R509 Fever, unspecified: Secondary | ICD-10-CM | POA: Diagnosis not present

## 2019-05-27 DIAGNOSIS — Z79899 Other long term (current) drug therapy: Secondary | ICD-10-CM

## 2019-05-27 DIAGNOSIS — R112 Nausea with vomiting, unspecified: Secondary | ICD-10-CM | POA: Diagnosis not present

## 2019-05-27 DIAGNOSIS — R918 Other nonspecific abnormal finding of lung field: Secondary | ICD-10-CM | POA: Diagnosis not present

## 2019-05-27 DIAGNOSIS — K219 Gastro-esophageal reflux disease without esophagitis: Secondary | ICD-10-CM | POA: Diagnosis present

## 2019-05-27 DIAGNOSIS — R748 Abnormal levels of other serum enzymes: Secondary | ICD-10-CM | POA: Diagnosis not present

## 2019-05-27 DIAGNOSIS — Z85828 Personal history of other malignant neoplasm of skin: Secondary | ICD-10-CM | POA: Diagnosis not present

## 2019-05-27 DIAGNOSIS — E875 Hyperkalemia: Secondary | ICD-10-CM | POA: Diagnosis not present

## 2019-05-27 DIAGNOSIS — Z7951 Long term (current) use of inhaled steroids: Secondary | ICD-10-CM

## 2019-05-27 DIAGNOSIS — Z825 Family history of asthma and other chronic lower respiratory diseases: Secondary | ICD-10-CM

## 2019-05-27 DIAGNOSIS — I739 Peripheral vascular disease, unspecified: Secondary | ICD-10-CM | POA: Diagnosis present

## 2019-05-27 DIAGNOSIS — Z91038 Other insect allergy status: Secondary | ICD-10-CM

## 2019-05-27 DIAGNOSIS — Z955 Presence of coronary angioplasty implant and graft: Secondary | ICD-10-CM

## 2019-05-27 DIAGNOSIS — G4700 Insomnia, unspecified: Secondary | ICD-10-CM | POA: Diagnosis not present

## 2019-05-27 DIAGNOSIS — R17 Unspecified jaundice: Secondary | ICD-10-CM | POA: Diagnosis not present

## 2019-05-27 DIAGNOSIS — K5190 Ulcerative colitis, unspecified, without complications: Secondary | ICD-10-CM | POA: Diagnosis present

## 2019-05-27 DIAGNOSIS — E785 Hyperlipidemia, unspecified: Secondary | ICD-10-CM | POA: Diagnosis present

## 2019-05-27 DIAGNOSIS — R7401 Elevation of levels of liver transaminase levels: Secondary | ICD-10-CM | POA: Diagnosis present

## 2019-05-27 DIAGNOSIS — J9811 Atelectasis: Secondary | ICD-10-CM | POA: Diagnosis not present

## 2019-05-27 DIAGNOSIS — E782 Mixed hyperlipidemia: Secondary | ICD-10-CM | POA: Diagnosis not present

## 2019-05-27 DIAGNOSIS — R52 Pain, unspecified: Secondary | ICD-10-CM

## 2019-05-27 DIAGNOSIS — Z888 Allergy status to other drugs, medicaments and biological substances status: Secondary | ICD-10-CM

## 2019-05-27 DIAGNOSIS — Z87891 Personal history of nicotine dependence: Secondary | ICD-10-CM

## 2019-05-27 DIAGNOSIS — E7439 Other disorders of intestinal carbohydrate absorption: Secondary | ICD-10-CM | POA: Diagnosis present

## 2019-05-27 LAB — CBC
HCT: 44.1 % (ref 39.0–52.0)
Hemoglobin: 15 g/dL (ref 13.0–17.0)
MCH: 28.2 pg (ref 26.0–34.0)
MCHC: 34 g/dL (ref 30.0–36.0)
MCV: 83.1 fL (ref 80.0–100.0)
Platelets: 183 10*3/uL (ref 150–400)
RBC: 5.31 MIL/uL (ref 4.22–5.81)
RDW: 15.9 % — ABNORMAL HIGH (ref 11.5–15.5)
WBC: 7.5 10*3/uL (ref 4.0–10.5)
nRBC: 0 % (ref 0.0–0.2)

## 2019-05-27 LAB — COMPREHENSIVE METABOLIC PANEL
ALT: 22 U/L (ref 0–44)
AST: 49 U/L — ABNORMAL HIGH (ref 15–41)
Albumin: 3 g/dL — ABNORMAL LOW (ref 3.5–5.0)
Alkaline Phosphatase: 66 U/L (ref 38–126)
Anion gap: 13 (ref 5–15)
BUN: 22 mg/dL (ref 8–23)
CO2: 18 mmol/L — ABNORMAL LOW (ref 22–32)
Calcium: 8 mg/dL — ABNORMAL LOW (ref 8.9–10.3)
Chloride: 98 mmol/L (ref 98–111)
Creatinine, Ser: 1.28 mg/dL — ABNORMAL HIGH (ref 0.61–1.24)
GFR calc Af Amer: >60 mL/min (ref >60)
GFR calc non Af Amer: 54 mL/min — ABNORMAL LOW (ref >60)
Glucose, Bld: 113 mg/dL — ABNORMAL HIGH (ref 70–99)
Potassium: 5.4 mmol/L — ABNORMAL HIGH (ref 3.5–5.1)
Sodium: 129 mmol/L — ABNORMAL LOW (ref 135–145)
Total Bilirubin: 3.1 mg/dL — ABNORMAL HIGH (ref 0.3–1.2)
Total Protein: 6.1 g/dL — ABNORMAL LOW (ref 6.5–8.1)

## 2019-05-27 LAB — LIPASE, BLOOD: Lipase: 24 U/L (ref 11–51)

## 2019-05-27 MED ORDER — SODIUM CHLORIDE 0.9% FLUSH
3.0000 mL | Freq: Once | INTRAVENOUS | Status: AC
  Administered 2019-05-28: 08:00:00 3 mL via INTRAVENOUS

## 2019-05-27 NOTE — Telephone Encounter (Signed)
[name] [name],[name] (on DPR) call with her dad having symptoms of weakness, fever, nausea and vomiting, diarrhea. He was exposed to his siblings that tested positive for covid-19. She stated that he is too weak to walk and keeps falling down. Thinks he is dehydrated.  Advised to go to the ED and to call 911 if unable to get him to the car.  Answer Assessment - Initial Assessment Questions 1. CLOSE CONTACT: "Who is the person with the confirmed or suspected COVID-19 infection that you were exposed to?"     siblings 2. PLACE of CONTACT: "Where were you when you were exposed to COVID-19?" (e.g., home, school, medical waiting room; which city?)     Family gathering 3. TYPE of CONTACT: "How much contact was there?" (e.g., sitting next to, live in same house, work in same office, same building)     Close contact 4. DURATION of CONTACT: "How long were you in contact with the COVID-19 patient?" (e.g., a few seconds, passed by person, a few minutes, live with the patient)     Visiting with brothers and sisters that tested positive for the virus. 5. DATE of CONTACT: "When did you have contact with a COVID-19 patient?" (e.g., how many days ago)     Couple days ago 6. TRAVEL: "Have you traveled out of the country recently?" If so, "When and where?"     * Also ask about out-of-state travel, since the CDC has identified some high-risk cities for community spread in the [place].     * Note: Travel becomes less relevant if there is widespread community transmission where the patient lives.     N/a 7. COMMUNITY SPREAD: "Are there lots of cases of COVID-19 (community spread) where you live?" (See public health department website, if unsure)      In the community 8. SYMPTOMS: "Do you have any symptoms?" (e.g., fever, cough, breathing difficulty)     Cough, fever 9. PREGNANCY OR POSTPARTUM: "Is there any chance you are pregnant?" "When was your last menstrual period?" "Did you deliver in the last 2 weeks?"    n/a 10. HIGH  RISK: "Do you have any heart or lung problems? Do you have a weak immune system?" (e.g., CHF, COPD, asthma, HIV positive, chemotherapy, renal failure, diabetes mellitus, sickle cell anemia)       Hypertension and obesity  Protocols used: CORONAVIRUS (COVID-19) EXPOSURE-A-AH

## 2019-05-27 NOTE — ED Triage Notes (Signed)
Headaches, nausea, diarrhea, unable to eat x 2 weeks.  Exposed to 4 family members that were diagnosed Covid positive with similar symptoms.

## 2019-05-27 NOTE — Telephone Encounter (Signed)
Noted  

## 2019-05-28 ENCOUNTER — Emergency Department (HOSPITAL_COMMUNITY): Payer: Medicare Other

## 2019-05-28 ENCOUNTER — Other Ambulatory Visit: Payer: Self-pay

## 2019-05-28 ENCOUNTER — Encounter (HOSPITAL_COMMUNITY): Payer: Self-pay

## 2019-05-28 DIAGNOSIS — E875 Hyperkalemia: Secondary | ICD-10-CM | POA: Diagnosis present

## 2019-05-28 DIAGNOSIS — R918 Other nonspecific abnormal finding of lung field: Secondary | ICD-10-CM | POA: Diagnosis not present

## 2019-05-28 DIAGNOSIS — I2699 Other pulmonary embolism without acute cor pulmonale: Secondary | ICD-10-CM | POA: Diagnosis not present

## 2019-05-28 DIAGNOSIS — K219 Gastro-esophageal reflux disease without esophagitis: Secondary | ICD-10-CM | POA: Diagnosis not present

## 2019-05-28 DIAGNOSIS — R071 Chest pain on breathing: Secondary | ICD-10-CM | POA: Diagnosis not present

## 2019-05-28 DIAGNOSIS — J431 Panlobular emphysema: Secondary | ICD-10-CM | POA: Diagnosis not present

## 2019-05-28 DIAGNOSIS — R748 Abnormal levels of other serum enzymes: Secondary | ICD-10-CM | POA: Diagnosis not present

## 2019-05-28 DIAGNOSIS — I959 Hypotension, unspecified: Secondary | ICD-10-CM | POA: Diagnosis not present

## 2019-05-28 DIAGNOSIS — I10 Essential (primary) hypertension: Secondary | ICD-10-CM | POA: Diagnosis not present

## 2019-05-28 DIAGNOSIS — Z9689 Presence of other specified functional implants: Secondary | ICD-10-CM | POA: Diagnosis not present

## 2019-05-28 DIAGNOSIS — J939 Pneumothorax, unspecified: Secondary | ICD-10-CM | POA: Diagnosis not present

## 2019-05-28 DIAGNOSIS — K5100 Ulcerative (chronic) pancolitis without complications: Secondary | ICD-10-CM | POA: Diagnosis not present

## 2019-05-28 DIAGNOSIS — Z791 Long term (current) use of non-steroidal anti-inflammatories (NSAID): Secondary | ICD-10-CM | POA: Diagnosis not present

## 2019-05-28 DIAGNOSIS — U071 COVID-19: Secondary | ICD-10-CM | POA: Diagnosis not present

## 2019-05-28 DIAGNOSIS — Z955 Presence of coronary angioplasty implant and graft: Secondary | ICD-10-CM | POA: Diagnosis not present

## 2019-05-28 DIAGNOSIS — G4700 Insomnia, unspecified: Secondary | ICD-10-CM | POA: Diagnosis not present

## 2019-05-28 DIAGNOSIS — E782 Mixed hyperlipidemia: Secondary | ICD-10-CM | POA: Diagnosis not present

## 2019-05-28 DIAGNOSIS — Z7902 Long term (current) use of antithrombotics/antiplatelets: Secondary | ICD-10-CM | POA: Diagnosis not present

## 2019-05-28 DIAGNOSIS — R740 Nonspecific elevation of levels of transaminase and lactic acid dehydrogenase [LDH]: Secondary | ICD-10-CM | POA: Diagnosis not present

## 2019-05-28 DIAGNOSIS — R509 Fever, unspecified: Secondary | ICD-10-CM | POA: Diagnosis not present

## 2019-05-28 DIAGNOSIS — Z85828 Personal history of other malignant neoplasm of skin: Secondary | ICD-10-CM | POA: Diagnosis not present

## 2019-05-28 DIAGNOSIS — J440 Chronic obstructive pulmonary disease with acute lower respiratory infection: Secondary | ICD-10-CM | POA: Diagnosis not present

## 2019-05-28 DIAGNOSIS — J069 Acute upper respiratory infection, unspecified: Secondary | ICD-10-CM | POA: Diagnosis present

## 2019-05-28 DIAGNOSIS — K2270 Barrett's esophagus without dysplasia: Secondary | ICD-10-CM | POA: Diagnosis not present

## 2019-05-28 DIAGNOSIS — R112 Nausea with vomiting, unspecified: Secondary | ICD-10-CM | POA: Diagnosis not present

## 2019-05-28 DIAGNOSIS — E860 Dehydration: Secondary | ICD-10-CM | POA: Diagnosis not present

## 2019-05-28 DIAGNOSIS — K5190 Ulcerative colitis, unspecified, without complications: Secondary | ICD-10-CM | POA: Diagnosis not present

## 2019-05-28 DIAGNOSIS — R17 Unspecified jaundice: Secondary | ICD-10-CM | POA: Diagnosis not present

## 2019-05-28 DIAGNOSIS — J1289 Other viral pneumonia: Secondary | ICD-10-CM | POA: Diagnosis not present

## 2019-05-28 DIAGNOSIS — R0602 Shortness of breath: Secondary | ICD-10-CM | POA: Diagnosis not present

## 2019-05-28 DIAGNOSIS — N289 Disorder of kidney and ureter, unspecified: Secondary | ICD-10-CM | POA: Diagnosis not present

## 2019-05-28 DIAGNOSIS — R7401 Elevation of levels of liver transaminase levels: Secondary | ICD-10-CM | POA: Diagnosis present

## 2019-05-28 DIAGNOSIS — J189 Pneumonia, unspecified organism: Secondary | ICD-10-CM | POA: Diagnosis not present

## 2019-05-28 DIAGNOSIS — Z66 Do not resuscitate: Secondary | ICD-10-CM | POA: Diagnosis not present

## 2019-05-28 DIAGNOSIS — E785 Hyperlipidemia, unspecified: Secondary | ICD-10-CM | POA: Diagnosis not present

## 2019-05-28 DIAGNOSIS — J9383 Other pneumothorax: Secondary | ICD-10-CM | POA: Diagnosis not present

## 2019-05-28 DIAGNOSIS — J9811 Atelectasis: Secondary | ICD-10-CM | POA: Diagnosis not present

## 2019-05-28 DIAGNOSIS — E7439 Other disorders of intestinal carbohydrate absorption: Secondary | ICD-10-CM | POA: Diagnosis not present

## 2019-05-28 DIAGNOSIS — R197 Diarrhea, unspecified: Secondary | ICD-10-CM | POA: Diagnosis not present

## 2019-05-28 DIAGNOSIS — E871 Hypo-osmolality and hyponatremia: Secondary | ICD-10-CM | POA: Diagnosis not present

## 2019-05-28 DIAGNOSIS — R531 Weakness: Secondary | ICD-10-CM | POA: Diagnosis not present

## 2019-05-28 DIAGNOSIS — I739 Peripheral vascular disease, unspecified: Secondary | ICD-10-CM | POA: Diagnosis not present

## 2019-05-28 DIAGNOSIS — J9601 Acute respiratory failure with hypoxia: Secondary | ICD-10-CM | POA: Diagnosis not present

## 2019-05-28 DIAGNOSIS — N179 Acute kidney failure, unspecified: Secondary | ICD-10-CM | POA: Diagnosis not present

## 2019-05-28 DIAGNOSIS — Z515 Encounter for palliative care: Secondary | ICD-10-CM | POA: Diagnosis not present

## 2019-05-28 DIAGNOSIS — R0902 Hypoxemia: Secondary | ICD-10-CM | POA: Diagnosis not present

## 2019-05-28 DIAGNOSIS — R0789 Other chest pain: Secondary | ICD-10-CM | POA: Diagnosis not present

## 2019-05-28 DIAGNOSIS — J9382 Other air leak: Secondary | ICD-10-CM | POA: Diagnosis not present

## 2019-05-28 LAB — FERRITIN: Ferritin: 320 ng/mL (ref 24–336)

## 2019-05-28 LAB — SARS CORONAVIRUS 2 BY RT PCR (HOSPITAL ORDER, PERFORMED IN ~~LOC~~ HOSPITAL LAB): SARS Coronavirus 2: POSITIVE — AB

## 2019-05-28 LAB — TYPE AND SCREEN
ABO/RH(D): A POS
Antibody Screen: NEGATIVE

## 2019-05-28 LAB — BRAIN NATRIURETIC PEPTIDE: B Natriuretic Peptide: 46.9 pg/mL (ref 0.0–100.0)

## 2019-05-28 LAB — D-DIMER, QUANTITATIVE: D-Dimer, Quant: 0.68 ug/mL-FEU — ABNORMAL HIGH (ref 0.00–0.50)

## 2019-05-28 LAB — PROCALCITONIN: Procalcitonin: <0.1 ng/mL

## 2019-05-28 LAB — TROPONIN I (HIGH SENSITIVITY): Troponin I (High Sensitivity): 13 ng/L (ref <18)

## 2019-05-28 LAB — POTASSIUM: Potassium: 4.1 mmol/L (ref 3.5–5.1)

## 2019-05-28 LAB — FIBRINOGEN: Fibrinogen: >800 mg/dL — ABNORMAL HIGH (ref 210–475)

## 2019-05-28 LAB — LACTATE DEHYDROGENASE: LDH: 252 U/L — ABNORMAL HIGH (ref 98–192)

## 2019-05-28 LAB — C-REACTIVE PROTEIN: CRP: 19.9 mg/dL — ABNORMAL HIGH (ref <1.0)

## 2019-05-28 MED ORDER — ONDANSETRON HCL 4 MG/2ML IJ SOLN: 4.0000 mg | Freq: Four times a day (QID) | INTRAMUSCULAR | Status: DC | PRN

## 2019-05-28 MED ORDER — DEXAMETHASONE SODIUM PHOSPHATE 10 MG/ML IJ SOLN
6.0000 mg | INTRAMUSCULAR | Status: AC
  Administered 2019-05-28 – 2019-06-06 (×10): 6 mg via INTRAVENOUS
  Filled 2019-05-28 (×11): qty 1

## 2019-05-28 MED ORDER — ONDANSETRON HCL 4 MG/2ML IJ SOLN
4.0000 mg | Freq: Once | INTRAMUSCULAR | Status: AC
  Administered 2019-05-28: 05:00:00 4 mg via INTRAVENOUS
  Filled 2019-05-28: qty 2

## 2019-05-28 MED ORDER — SODIUM CHLORIDE 0.9 % IV SOLN
200.0000 mg | Freq: Once | INTRAVENOUS | Status: AC
  Administered 2019-05-28: 13:00:00 200 mg via INTRAVENOUS
  Filled 2019-05-28: qty 40

## 2019-05-28 MED ORDER — ACETAMINOPHEN 325 MG PO TABS
650.0000 mg | ORAL_TABLET | Freq: Four times a day (QID) | ORAL | Status: DC | PRN
  Administered 2019-05-29 – 2019-06-05 (×9): 650 mg via ORAL
  Filled 2019-05-28 (×9): qty 2

## 2019-05-28 MED ORDER — SODIUM CHLORIDE 0.9 % IV BOLUS (SEPSIS)
1000.0000 mL | Freq: Once | INTRAVENOUS | Status: AC
  Administered 2019-05-28: 08:00:00 1000 mL via INTRAVENOUS

## 2019-05-28 MED ORDER — SODIUM CHLORIDE 0.9 % IV SOLN
100.0000 mg | INTRAVENOUS | Status: AC
  Administered 2019-05-29 – 2019-06-01 (×4): 100 mg via INTRAVENOUS
  Filled 2019-05-28 (×4): qty 20

## 2019-05-28 MED ORDER — ZINC SULFATE 220 (50 ZN) MG PO CAPS
220.0000 mg | ORAL_CAPSULE | Freq: Every day | ORAL | Status: DC
  Administered 2019-05-28 – 2019-06-06 (×10): 220 mg via ORAL
  Filled 2019-05-28 (×10): qty 1

## 2019-05-28 MED ORDER — PANTOPRAZOLE SODIUM 40 MG IV SOLR
40.0000 mg | Freq: Every day | INTRAVENOUS | Status: DC
  Administered 2019-05-28 – 2019-05-30 (×3): 40 mg via INTRAVENOUS
  Filled 2019-05-28 (×3): qty 40

## 2019-05-28 MED ORDER — VITAMIN C 500 MG PO TABS
500.0000 mg | ORAL_TABLET | Freq: Every day | ORAL | Status: DC
  Administered 2019-05-28 – 2019-06-06 (×10): 500 mg via ORAL
  Filled 2019-05-28 (×11): qty 1

## 2019-05-28 MED ORDER — MOMETASONE FURO-FORMOTEROL FUM 200-5 MCG/ACT IN AERO
2.0000 | INHALATION_SPRAY | Freq: Two times a day (BID) | RESPIRATORY_TRACT | Status: DC
  Administered 2019-05-28 – 2019-06-06 (×18): 2 via RESPIRATORY_TRACT
  Filled 2019-05-28: qty 8.8

## 2019-05-28 MED ORDER — SODIUM CHLORIDE 0.9% FLUSH
3.0000 mL | Freq: Two times a day (BID) | INTRAVENOUS | Status: DC
  Administered 2019-05-28 – 2019-06-06 (×18): 3 mL via INTRAVENOUS

## 2019-05-28 MED ORDER — GUAIFENESIN-DM 100-10 MG/5ML PO SYRP: 10.0000 mL | ORAL_SOLUTION | ORAL | Status: DC | PRN

## 2019-05-28 MED ORDER — HYDRALAZINE HCL 20 MG/ML IJ SOLN: 10.0000 mg | INTRAMUSCULAR | Status: DC | PRN

## 2019-05-28 MED ORDER — ENOXAPARIN SODIUM 40 MG/0.4ML ~~LOC~~ SOLN
40.0000 mg | SUBCUTANEOUS | Status: DC
  Administered 2019-05-28 – 2019-06-05 (×9): 40 mg via SUBCUTANEOUS
  Filled 2019-05-28 (×9): qty 0.4

## 2019-05-28 MED ORDER — HYDROCOD POLST-CPM POLST ER 10-8 MG/5ML PO SUER
5.0000 mL | Freq: Two times a day (BID) | ORAL | Status: DC | PRN
  Administered 2019-06-04 – 2019-06-05 (×2): 5 mL via ORAL
  Filled 2019-05-28 (×3): qty 5

## 2019-05-28 MED ORDER — SODIUM CHLORIDE 0.9 % IV BOLUS (SEPSIS)
1000.0000 mL | Freq: Once | INTRAVENOUS | Status: AC
  Administered 2019-05-28: 1000 mL via INTRAVENOUS

## 2019-05-28 MED ORDER — ONDANSETRON HCL 4 MG PO TABS: 4.0000 mg | ORAL_TABLET | Freq: Four times a day (QID) | ORAL | Status: DC | PRN

## 2019-05-28 MED ORDER — SODIUM CHLORIDE 0.9 % IV SOLN
INTRAVENOUS | Status: DC
  Administered 2019-05-28: 13:00:00 via INTRAVENOUS
  Administered 2019-05-29: 1000 mL via INTRAVENOUS

## 2019-05-28 MED ORDER — IPRATROPIUM-ALBUTEROL 20-100 MCG/ACT IN AERS
1.0000 | INHALATION_SPRAY | Freq: Four times a day (QID) | RESPIRATORY_TRACT | Status: DC
  Administered 2019-05-28 – 2019-06-06 (×32): 1 via RESPIRATORY_TRACT
  Filled 2019-05-28: qty 4

## 2019-05-28 NOTE — H&P (Signed)
History and Physical    [name] [name] PPJ:[id number] DOB: 1940/09/28 DOA: 05/27/2019  Referring MD/NP/PA: Gareth Morgan, MD PCP: Biagio Borg, MD  Patient coming from: Home  Chief Complaint: Nausea, vomiting, and diarrhea   I have personally briefly reviewed patient's old medical records in [place]   HPI: [name] [name] is a 78 y.o. male with medical history significant of hypertension, hyperlipidemia, COPD, peripheral vascular disease, Barrett's esophagus, and GERD; who presents with complaints of nausea, vomiting, and diarrhea over the last 2 weeks.  At baseline patient lives alone, but was reportedly exposed to family members who tested positive.  Diarrhea has been watery reporting 3 bowel movements per day on average.  Emesis and diarrhea were not noted to have any signs of blood.  He reports having no appetite with significantly decreased p.o. intake over the last 4 days.  Associated symptoms include headache, fever up to 100 F at home, chills, generalized body aches, mild intermittent nonproductive cough, and shortness of breath.  Denies having any significant abdominal pain, dysuria, palpitations, or recent falls.  At home patient is on Symbicort and albuterol inhaler.   ED Course: Upon into the emergency department patient was noted to be afebrile, pulse 83-1 04, respirations 17-27, blood pressures 126/58-157/95, and O2 saturations as low as 88% on room air with improvement to 96% on 2 L nasal cannula oxygen.  WBC 7.5, sodium 129, potassium 5.4, CO2 18, BUN 22, creatinine 1.28, AST 49, and total bilirubin 3.1.  Chest x-ray showed only chronic changes suggestive of emphysema.  Patient was given 2 L of normal saline IV fluids, Zofran, and 6 mg of Decadron IV.  TRH called to admit  Review of Systems  Constitutional: Positive for chills, fever and malaise/fatigue.  HENT: Negative for ear discharge and nosebleeds.   Eyes: Negative for photophobia and pain.  Respiratory:  Positive for cough and shortness of breath. Negative for sputum production and wheezing.   Cardiovascular: Negative for chest pain and leg swelling.  Gastrointestinal: Positive for diarrhea, nausea and vomiting. Negative for blood in stool.  Genitourinary: Negative for dysuria and hematuria.  Musculoskeletal: Positive for myalgias. Negative for falls.  Skin: Negative for itching and rash.  Neurological: Positive for weakness and headaches. Negative for speech change and loss of consciousness.  Psychiatric/Behavioral: Negative for substance abuse and suicidal ideas.    Past Medical History:  Diagnosis Date  . Abdominal pain, unspecified site 10/16/2009  . Anal fissure 04/24/2008  . Arthritis   . B12 deficiency 10/11/2014  . BACK PAIN 01/10/2009  . Barrett's esophagus 1997  . BARRETTS ESOPHAGUS 08/16/2007  . BREAST HYPERTROPHY 02/16/2009  . CARBUNCLE, NECK 02/14/2008  . Cataract   . DIVERTICULOSIS, COLON 06/28/2007  . Full dentures   . GERD 06/28/2007  . GLUCOSE INTOLERANCE 08/15/2008  . Headache(784.0) 11/13/2009  . HERNIA, UMBILICAL W/OBSTRUCTION W/O GANGRENE 06/28/2007  . HTN (hypertension) 07/12/2012  . HYPERLIPIDEMIA 06/28/2007  . HYPERTENSION 06/28/2007  . Impaired glucose tolerance 04/13/2011  . INSOMNIA-SLEEP DISORDER-UNSPEC 10/10/2008  . LEG PAIN, BILATERAL 02/16/2009  . LOW BACK PAIN 06/28/2007  . MASTITIS 01/10/2009  . NECK PAIN 05/31/2008  . NEPHROLITHIASIS, HX OF 06/28/2007  . PROCTOSIGMOIDITIS, ULCERATIVE 1997  . PVD (peripheral vascular disease) with claudication, lifestyle limiting, ABI rt. 0.67, lt. 0.73 07/12/2012  . S/P angioplasty with stent, after DB athrectomy to Rt. ext. iliac and Rt. SFA 07/12/2012  . Skin cancer    right hand  . Spinal stenosis at L4-L5 level   .  Stenosis of left carotid artery, mod to severe by dopplers 09/29/2012  . TESTICULAR PAIN, RIGHT 10/25/2010  . VITAMIN D DEFICIENCY 11/13/2009    Past Surgical History:  Procedure Laterality Date  . ABDOMINAL  AORTOGRAM N/A 05/13/2018   Procedure: ABDOMINAL AORTOGRAM;  Surgeon: [name], MD;  Location: [place] [facility];  Service: Cardiovascular;  Laterality: N/A;  . anal fissure surgury     pt unsure of this  . ATHERECTOMY  09/28/2012  . ATHERECTOMY N/A 07/12/2012   Procedure: ATHERECTOMY;  Surgeon: [name], MD;  Location: [facility] CATH LAB;  Service: Cardiovascular;  Laterality: N/A;  . ATHERECTOMY N/A 09/28/2012   Procedure: ATHERECTOMY;  Surgeon: [name], MD;  Location: [facility] CATH LAB;  Service: Cardiovascular;  Laterality: N/A;  . bilateral inguinal hernia with mesh and umbilical hernia repairs  09/2007  . CATARACT EXTRACTION Bilateral    bilateral  . COLONOSCOPY    . ENDARTERECTOMY FEMORAL Right 07/19/2018   Procedure: ENDARTERECTOMY RIGHT FEMORAL ARTERY;  Surgeon: [name], MD;  Location: [place];  Service: Vascular;  Laterality: Right;  . GROIN DISSECTION Right 07/20/2013   Procedure: GROIN EXPLORATION, EXCISIONAL BIOPSY RIGHT INGUINAL LYMPH NODES;  Surgeon: Harl Bowie, MD;  Location: [place];  Service: General;  Laterality: Right;  . HERNIA REPAIR    . left knee arthroscopy    . LOWER EXTREMITY ANGIOGRAPHY Bilateral 05/13/2018   Procedure: Lower Extremity Angiography;  Surgeon: [name], MD;  Location: [place] [facility];  Service: Cardiovascular;  Laterality: Bilateral;  . LUMBAR LAMINECTOMY/DECOMPRESSION MICRODISCECTOMY N/A 11/05/2016   Procedure: Microlumbar decompression L2-3, L3-4, L4-5, lateral mass fusion L4-5;  Surgeon: Susa Day, MD;  Location: [facility];  Service: Orthopedics;  Laterality: N/A;  . PATCH ANGIOPLASTY Right 07/19/2018   Procedure: RIGHT FEMORAL PATCH ANGIOPLASTY WITH XENOSURE BIOLOGIC PATCH;  Surgeon: [name], MD;  Location: [place];  Service: Vascular;  Laterality: Right;  . PERCUTANEOUS STENT INTERVENTION  09/28/2012   Procedure: PERCUTANEOUS STENT INTERVENTION;  Surgeon: [name],  MD;  Location: [facility] CATH LAB;  Service: Cardiovascular;;  . PERIPHERAL VASCULAR INTERVENTION  05/13/2018   Procedure: PERIPHERAL VASCULAR INTERVENTION;  Surgeon: [name], MD;  Location: [place] [facility];  Service: Cardiovascular;;  Ext. Iliac  . UPPER GI ENDOSCOPY       reports that he quit smoking about 46 years ago. He has quit using smokeless tobacco.  His smokeless tobacco use included chew. He reports previous alcohol use of about 5.0 standard drinks of alcohol per week. He reports that he does not use drugs.  Allergies  Allergen Reactions  . Wasp Venom Anaphylaxis  . Contrast Media [Iodinated Diagnostic Agents] Hives    Break out in welts.  Needs 13-hr prep.  . Statins Other (See Comments)    Legs pain: Atorvastatin and Lovastatin    Family History  Problem Relation Age of Onset  . COPD Mother   . Cerebral aneurysm Mother   . COPD Father   . Colon cancer Neg Hx   . Esophageal cancer Neg Hx   . Rectal cancer Neg Hx   . Stomach cancer Neg Hx   . Colon polyps Neg Hx     Prior to Admission medications   Medication Sig Start Date End Date Taking? Authorizing Provider  albuterol (VENTOLIN HFA) 108 (90 Base) MCG/ACT inhaler Inhale 2 puffs into the lungs every 6 (six) hours as needed for wheezing or shortness of breath. 01/28/19  Biagio Borg, MD  allopurinol (ZYLOPRIM) 100 MG tablet Take 2 tablets (200 mg total) by mouth daily. 05/12/18   Lyndal Pulley, DO  budesonide-formoterol (SYMBICORT) 160-4.5 MCG/ACT inhaler Inhale 2 puffs into the lungs 2 (two) times daily. 01/28/19   Biagio Borg, MD  clopidogrel (PLAVIX) 75 MG tablet TAKE 1 TABLET BY MOUTH  DAILY Patient taking differently: Take 75 mg by mouth daily.  11/25/18   Biagio Borg, MD  cyanocobalamin 1000 MCG tablet Take 1,000 mcg by mouth daily.     [provider]  gabapentin (NEURONTIN) 100 MG capsule Take 1 capsule (100 mg total) by mouth at bedtime. 04/29/19   Lyndal Pulley, DO   lisinopril-hydrochlorothiazide (PRINZIDE,ZESTORETIC) 20-25 MG tablet TAKE 1 TABLET BY MOUTH  DAILY 11/25/18   Biagio Borg, MD  mesalamine (APRISO) 0.375 g 24 hr capsule Take 4 capsules (1.5 g total) by mouth daily. Take 4 tablets all at one time. 02/08/19   Ladene Artist, MD  pantoprazole (PROTONIX) 40 MG tablet Take 1 tablet (40 mg total) by mouth daily. 02/08/19   Ladene Artist, MD  zolpidem (AMBIEN) 10 MG tablet TAKE 1 TABLET BY MOUTH AT BEDTIME AS NEEDED FOR SLEEP. Patient taking differently: Take 10 mg by mouth at bedtime.  03/02/19   Biagio Borg, MD    Physical Exam:  Constitutional: Elderly male who appears to be acutely sick Vitals:   05/28/19 0515 05/28/19 0530 05/28/19 0545 05/28/19 0600  BP: (!) 126/58 (!) 150/70 139/65 (!) 151/74  Pulse: 87 91 84 95  Resp: 20 (!) 21 (!) 22 (!) 21  Temp:      TempSrc:      SpO2: 93% 95% 96% 96%   Eyes: PERRL, lids and conjunctivae normal ENMT: Mucous membranes are dry. Posterior pharynx clear of any exudate or lesions.  Neck: normal, supple, no masses, no thyromegaly Respiratory: Decreased overall aeration.  No significant crackles or rhonchi appreciated.  Patient currently on 2 L nasal cannula oxygen with O2 saturations maintained. Cardiovascular: Regular rate and rhythm, no murmurs / rubs / gallops. No extremity edema. 2+ pedal pulses. No carotid bruits.  Abdomen: no tenderness, no masses palpated. No hepatosplenomegaly. Bowel sounds positive.  Musculoskeletal: no clubbing / cyanosis. No joint deformity upper and lower extremities. Good ROM, no contractures. Normal muscle tone.  Skin: no rashes, lesions, ulcers. No induration Neurologic: CN 2-12 grossly intact. Sensation intact, DTR normal. Strength 5/5 in all 4.  Psychiatric: Normal judgment and insight. Alert and oriented x 3. Normal mood.     Labs on Admission: I have personally reviewed following labs and imaging studies  CBC: Recent Labs  Lab 05/27/19 1536  WBC 7.5  HGB  15.0  HCT 44.1  MCV 83.1  PLT 222   Basic Metabolic Panel: Recent Labs  Lab 05/27/19 1536  NA 129*  K 5.4*  CL 98  CO2 18*  GLUCOSE 113*  BUN 22  CREATININE 1.28*  CALCIUM 8.0*   GFR: CrCl cannot be calculated (Unknown ideal weight.). Liver Function Tests: Recent Labs  Lab 05/27/19 1536  AST 49*  ALT 22  ALKPHOS 66  BILITOT 3.1*  PROT 6.1*  ALBUMIN 3.0*   Recent Labs  Lab 05/27/19 1536  LIPASE 24   No results for input(s): AMMONIA in the last 168 hours. Coagulation Profile: No results for input(s): INR, PROTIME in the last 168 hours. Cardiac Enzymes: No results for input(s): CKTOTAL, CKMB, CKMBINDEX, TROPONINI in the last 168 hours.  BNP (last 3 results) Recent Labs    02/07/19 0954  PROBNP 57.0   HbA1C: No results for input(s): HGBA1C in the last 72 hours. CBG: No results for input(s): GLUCAP in the last 168 hours. Lipid Profile: No results for input(s): CHOL, HDL, LDLCALC, TRIG, CHOLHDL, LDLDIRECT in the last 72 hours. Thyroid Function Tests: No results for input(s): TSH, T4TOTAL, FREET4, T3FREE, THYROIDAB in the last 72 hours. Anemia Panel: No results for input(s): VITAMINB12, FOLATE, FERRITIN, TIBC, IRON, RETICCTPCT in the last 72 hours. Urine analysis:    Component Value Date/Time   COLORURINE YELLOW 02/07/2019 [place] 02/07/2019 0954   LABSPEC 1.020 02/07/2019 0954   PHURINE 6.0 02/07/2019 0954   GLUCOSEU NEGATIVE 02/07/2019 0954   HGBUR NEGATIVE 02/07/2019 0954   BILIRUBINUR NEGATIVE 02/07/2019 0954   KETONESUR NEGATIVE 02/07/2019 0954   PROTEINUR NEGATIVE 07/12/2018 1506   UROBILINOGEN 0.2 02/07/2019 0954   NITRITE NEGATIVE 02/07/2019 0954   LEUKOCYTESUR NEGATIVE 02/07/2019 0954   Sepsis Labs: Recent Results (from the past 240 hour(s))  SARS Coronavirus 2 [facility] order, Performed in [facility] hospital lab) Nasopharyngeal Nasopharyngeal Swab     Status: Abnormal   Collection Time: 05/28/19  4:38 AM   Specimen:  Nasopharyngeal Swab  Result Value Ref Range Status   SARS Coronavirus 2 POSITIVE (A) NEGATIVE Final    Comment: RESULT CALLED TO, READ BACK BY AND VERIFIED WITH: RN M BROOKS @ [phone] 05/28/19 BY S GEZAHEGN (NOTE) If result is NEGATIVE SARS-CoV-2 target nucleic acids are NOT DETECTED. The SARS-CoV-2 RNA is generally detectable in upper and lower  respiratory specimens during the acute phase of infection. The lowest  concentration of SARS-CoV-2 viral copies this assay can detect is 250  copies / mL. A negative result does not preclude SARS-CoV-2 infection  and should not be used as the sole basis for treatment or other  patient management decisions.  A negative result may occur with  improper specimen collection / handling, submission of specimen other  than nasopharyngeal swab, presence of viral mutation(s) within the  areas targeted by this assay, and inadequate number of viral copies  (<250 copies / mL). A negative result must be combined with clinical  observations, patient history, and epidemiological information. If result is POSITIVE SARS-CoV-2 target nucleic acids are DETECTE D. The SARS-CoV-2 RNA is generally detectable in upper and lower  respiratory specimens during the acute phase of infection.  Positive  results are indicative of active infection with SARS-CoV-2.  Clinical  correlation with patient history and other diagnostic information is  necessary to determine patient infection status.  Positive results do  not rule out bacterial infection or co-infection with other viruses. If result is PRESUMPTIVE POSTIVE SARS-CoV-2 nucleic acids MAY BE PRESENT.   A presumptive positive result was obtained on the submitted specimen  and confirmed on repeat testing.  While 2019 novel coronavirus  (SARS-CoV-2) nucleic acids may be present in the submitted sample  additional confirmatory testing may be necessary for epidemiological  and / or clinical management purposes  to differentiate  between  SARS-CoV-2 and other Sarbecovirus currently known to infect humans.  If clinically indicated additional testing with an alternate test  methodology (LAB745 3) is advised. The SARS-CoV-2 RNA is generally  detectable in upper and lower respiratory specimens during the acute  phase of infection. The expected result is Negative. Fact Sheet for Patients:  StrictlyIdeas.no Fact Sheet for Healthcare Providers: BankingDealers.co.za This test is not yet approved or cleared by the  [place] Architectural technologist and has been authorized for detection and/or diagnosis of SARS-CoV-2 by FDA under an Print production planner (EUA).  This EUA will remain in effect (meaning this test can be used) for the duration of the COVID-19 declaration under Section 564(b)(1) of the Act, 21 U.S.C. section 360bbb-3(b)(1), unless the authorization is terminated or revoked sooner. Performed at [facility], [place] [address] [place], [place] [address]      Radiological Exams on Admission: Dg Chest Port 1 View  Result Date: 05/28/2019 CLINICAL DATA:  Headaches, nausea, diarrhea, unable to eat x 2 weeks. Exposed to 4 family members that were diagnosed Covid positive with similar symptoms EXAM: PORTABLE CHEST 1 VIEW COMPARISON:  05/28/2019 at 1:17 a.m., and older exams. FINDINGS: Cardiac silhouette is normal in size. No mediastinal or hilar masses. Lungs show interstitial thickening with a basilar predominance, unchanged. There is relative lucency in the upper lobes suggesting emphysema. No evidence of pneumonia or pulmonary edema. No convincing pleural effusion and no pneumothorax. IMPRESSION: 1. No acute cardiopulmonary disease and no change from the earlier study. 2. Chronic interstitial thickening with the basilar predominance. Findings suggest emphysema. Electronically Signed   By: Lajean Manes M.D.   On: 05/28/2019 05:43   Dg Chest Port 1 View  Result Date:  05/28/2019 CLINICAL DATA:  Weakness.  Fever. EXAM: PORTABLE CHEST 1 VIEW COMPARISON:  05/24/2015 FINDINGS: There are chronic appearing prominent interstitial lung markings primarily at the lung bases bilaterally. There is no pneumothorax. There is no focal infiltrate. The heart size is stable from prior studies. There is no acute osseous abnormality. IMPRESSION: No active disease. Electronically Signed   By: Constance Holster M.D.   On: 05/28/2019 01:47    Chest x-ray: Independently reviewed.  Chest x-ray showed no acute abnormalities.  Assessment/Plan Respiratory failure with hypoxia secondary to COVID-19 infection: Acute.  Patient found to be hypoxic down to 88% on room air requiring 2 L of nasal cannula oxygen.  Chest x-ray revealed emphysematous changes, but no signs of any focal infiltrates.  Inflammatory markers had not been obtained.  Patient was given 60 mg of Decadron IV. -Admit to the medical telemetry bed at [place] precautions -Continuous pulse oximetry with nasal cannula oxygen to maintain O2 saturation greater than 90% -Check inflammatory markers stat -Continue 6 mg of Decadron daily  -Combivent inhaler every 6 hours -Vitamin C and zinc -Notified pharmacy of Remdesivir candidate  -Continue daily monitoring of inflammatory markers  Nausea, vomiting, and diarrhea: Acute.  Suspect secondary to COVID-19 infection. -Antiemetics as needed -Diet as tolerated  Renal insufficiency: Patient's creatinine mildly elevated up to 1.28 with BUN 22.  Patient had received 2 L of normal saline IV fluids in the ED. -Normal saline IV fluids at 75 mL/h -Daily monitoring of kidney function  Hyperkalemia: Acute.  Potassium elevated at 5.4.  Patient was treated with normal saline IV fluids. -Check EKG -Recheck potassium  Hyponatremia: Acute.  Sodium 129 on admission.  Given recent episodes of nausea, vomiting, and diarrhea suspect a hypovolemic hyponatremia.  Patient  received normal saline IV fluids. -Continue to follow sodium levels   Essential hypertension: Patient blood pressures mildly elevated up to 157/95. -Hold lisinopril/hydrochlorothiazide due to renal insufficiency  -Hydralazine IV as needed for elevated blood pressure  COPD/emphysema: Stable. -Continue pharmacy substitution for Symbicort  Transaminitis, hyperbilirubinemia: Acute.  On admission AST 49 and total bilirubin 3.1, but does not appear to have previously been elevated before in the past.  Question  related to patient's acute dehydration. -Recheck CMP in a.m.  GERD, Barrett's esophagus: Patient at home appears to be on Protonix. -Protonix IV  DVT prophylaxis: Lovenox Code Status: Full Family Communication: No family present at bedside Disposition Plan: To be determined Consults called: None Admission status: Inpatient  Norval Morton MD [facility] Pager [phone]   If 7PM-7AM, please contact night-coverage [email or web address] Password [facility]  05/28/2019, 7:49 AM

## 2019-05-28 NOTE — Plan of Care (Signed)
Plan of care initiated.

## 2019-05-28 NOTE — ED Notes (Signed)
[facility] called for transport. 

## 2019-05-28 NOTE — ED Provider Notes (Signed)
[facility] EMERGENCY DEPARTMENT Provider Note   CSN: [id number] Arrival date & time: 05/27/19  1449     History   Chief Complaint Chief Complaint  Patient presents with   Generalized Body Aches    HPI [name] [name] is a 78 y.o. male.     The history is provided by the patient.  Diarrhea Quality:  Watery Severity:  Moderate Onset quality:  Gradual Timing:  Intermittent Progression:  Worsening Relieved by:  Nothing Worsened by:  Nothing Associated symptoms: chills, cough, fever, headaches and vomiting   Associated symptoms: no abdominal pain   Risk factors: sick contacts   Patient with history of hypertension, hyperlipidemia presents with vomiting and diarrhea.  He reports for the past 2 weeks has had intermittent episodes of vomiting/diarrhea.  Is been nonbloody.  He reports he is having lack of appetite and feels dehydrated.  He reports cough that he gets frequently He reports recent fever, T-max up to 100. He reports family members have been diagnosed with COVID-19 Denies chest pain/shortness of breath.  Denies abdominal pain Past Medical History:  Diagnosis Date   Abdominal pain, unspecified site 10/16/2009   Anal fissure 04/24/2008   Arthritis    B12 deficiency 10/11/2014   BACK PAIN 01/10/2009   Barrett's esophagus 1997   BARRETTS ESOPHAGUS 08/16/2007   BREAST HYPERTROPHY 02/16/2009   CARBUNCLE, NECK 02/14/2008   Cataract    DIVERTICULOSIS, COLON 06/28/2007   Full dentures    GERD 06/28/2007   GLUCOSE INTOLERANCE 08/15/2008   Headache(784.0) 5/0/0370   HERNIA, UMBILICAL W/OBSTRUCTION W/O GANGRENE 06/28/2007   HTN (hypertension) 07/12/2012   HYPERLIPIDEMIA 06/28/2007   HYPERTENSION 06/28/2007   Impaired glucose tolerance 04/13/2011   INSOMNIA-SLEEP DISORDER-UNSPEC 10/10/2008   LEG PAIN, BILATERAL 02/16/2009   LOW BACK PAIN 06/28/2007   MASTITIS 01/10/2009   NECK PAIN 05/31/2008   NEPHROLITHIASIS, HX OF 06/28/2007    PROCTOSIGMOIDITIS, ULCERATIVE 1997   PVD (peripheral vascular disease) with claudication, lifestyle limiting, ABI rt. 0.67, lt. 0.73 07/12/2012   S/P angioplasty with stent, after DB athrectomy to Rt. ext. iliac and Rt. SFA 07/12/2012   Skin cancer    right hand   Spinal stenosis at L4-L5 level    Stenosis of left carotid artery, mod to severe by dopplers 09/29/2012   TESTICULAR PAIN, RIGHT 10/25/2010   VITAMIN D DEFICIENCY 11/13/2009    Patient Active Problem List   Diagnosis Date Noted   Right inguinal hernia 04/07/2019   Dyspnea 01/28/2019   Acute sinusitis 11/18/2018   Wheezing 11/18/2018   PAD (peripheral artery disease) ([place]) 07/19/2018   PVD (peripheral vascular disease) ([place]) 05/04/2018   Spinal stenosis of lumbar region 11/05/2016   Spinal stenosis at L4-L5 level 11/05/2016   Mastalgia 04/10/2016   B12 deficiency 10/11/2014   Bilateral leg pain 10/10/2014   Double vision with both eyes open 10/13/2013   Stenosis of left carotid artery, mod to severe by dopplers 09/29/2012   S/P angioplasty with stent, and TurboHawk athrectomy mid lt. SFA for life style limiting claudication 09/28/12 09/29/2012   PVD (peripheral vascular disease) with claudication, lifestyle limiting, ABI rt. 0.67, lt. 0.73 07/12/2012   Thromboembolism, distal  Rt SFA  treated with aspiration thrombectomy, 07/12/12 07/12/2012   S/P insertion of iliac artery stent, external iliac and RT SFA  with DB athrectomy 07/12/12 07/12/2012   Impaired glucose tolerance 04/13/2011   Encounter for well adult exam with abnormal findings 04/13/2011   Chronic universal ulcerative colitis ([place]) 11/14/2009  VITAMIN D DEFICIENCY 11/13/2009   Depression 10/16/2009   SHOULDER PAIN, LEFT 10/16/2009   HIP PAIN, RIGHT 10/16/2009   Gynecomastia 02/16/2009   BACK PAIN 01/10/2009   INSOMNIA-SLEEP DISORDER-UNSPEC 10/10/2008   Chest pain 10/10/2008   NECK PAIN 05/31/2008   Hx of ulcerative colitis  04/24/2008   Anal fissure 04/24/2008   Personal history of colonic polyps 04/20/2008   CARBUNCLE, NECK 02/14/2008   BARRETTS ESOPHAGUS 08/16/2007   Hyperlipidemia 06/28/2007   Essential hypertension 06/28/2007   GERD 06/28/2007   DIVERTICULOSIS, COLON 06/28/2007   Lower back pain 06/28/2007   NEPHROLITHIASIS, HX OF 06/28/2007    Past Surgical History:  Procedure Laterality Date   ABDOMINAL AORTOGRAM N/A 05/13/2018   Procedure: ABDOMINAL AORTOGRAM;  Surgeon: [name], MD;  Location: [facility];  Service: Cardiovascular;  Laterality: N/A;   anal fissure surgury     pt unsure of this   ATHERECTOMY  09/28/2012   ATHERECTOMY N/A 07/12/2012   Procedure: ATHERECTOMY;  Surgeon: [name], MD;  Location: [facility];  Service: Cardiovascular;  Laterality: N/A;   ATHERECTOMY N/A 09/28/2012   Procedure: ATHERECTOMY;  Surgeon: [name], MD;  Location: [facility] C[facility];  Service: Cardiovascular;  Laterality: N/A;   bilateral inguinal hernia with mesh and umbilical hernia repairs  09/2007   CATARACT EXTRACTION Bilateral    bilateral   COLONOSCOPY     ENDARTERECTOMY FEMORAL Right 07/19/2018   Procedure: ENDARTERECTOMY RIGHT FEMORAL ARTERY;  Surgeon: [name], MD;  Location: [facility];  Service: Vascular;  Laterality: Right;   GROIN DISSECTION Right 07/20/2013   Procedure: GROIN EXPLORATION, EXCISIONAL BIOPSY RIGHT INGUINAL LYMPH NODES;  Surgeon: Harl Bowie, MD;  Location: [facility];  Service: General;  Laterality: Right;   HERNIA REPAIR     left knee arthroscopy     LOWER EXTREMITY ANGIOGRAPHY Bilateral 05/13/2018   Procedure: Lower Extremity Angiography;  Surgeon: [name], MD;  Location: [facility];  Service: Cardiovascular;  Laterality: Bilateral;   LUMBAR LAMINECTOMY/DECOMPRESSION MICRODISCECTOMY N/A 11/05/2016   Procedure: Microlumbar decompression L2-3, L3-4, L4-5, lateral mass fusion L4-5;   Surgeon: Susa Day, MD;  Location: [facility];  Service: Orthopedics;  Laterality: N/A;   PATCH ANGIOPLASTY Right 07/19/2018   Procedure: RIGHT FEMORAL PATCH ANGIOPLASTY WITH XENOSURE BIOLOGIC PATCH;  Surgeon: [name], MD;  Location: [facility];  Service: Vascular;  Laterality: Right;   PERCUTANEOUS STENT INTERVENTION  09/28/2012   Procedure: PERCUTANEOUS STENT INTERVENTION;  Surgeon: [name], MD;  Location: [facility] CATH LAB;  Service: Cardiovascular;;   PERIPHERAL VASCULAR INTERVENTION  05/13/2018   Procedure: PERIPHERAL VASCULAR INTERVENTION;  Surgeon: [name], MD;  Location: [facility];  Service: Cardiovascular;;  Ext. Iliac   UPPER GI ENDOSCOPY          Home Medications    Prior to Admission medications   Medication Sig Start Date End Date Taking? Authorizing Provider  albuterol (VENTOLIN HFA) 108 (90 Base) MCG/ACT inhaler Inhale 2 puffs into the lungs every 6 (six) hours as needed for wheezing or shortness of breath. 01/28/19   Biagio Borg, MD  allopurinol (ZYLOPRIM) 100 MG tablet Take 2 tablets (200 mg total) by mouth daily. 05/12/18   Lyndal Pulley, DO  budesonide-formoterol (SYMBICORT) 160-4.5 MCG/ACT inhaler Inhale 2 puffs into the lungs 2 (two) times daily. 01/28/19   Biagio Borg, MD  clopidogrel (PLAVIX) 75 MG tablet TAKE 1 TABLET BY MOUTH  DAILY Patient taking differently: Take 75 mg by mouth daily.  11/25/18   Biagio Borg, MD  cyanocobalamin 1000 MCG tablet Take 1,000 mcg by mouth daily.     [provider]  gabapentin (NEURONTIN) 100 MG capsule Take 1 capsule (100 mg total) by mouth at bedtime. 04/29/19   Lyndal Pulley, DO  lisinopril-hydrochlorothiazide (PRINZIDE,ZESTORETIC) 20-25 MG tablet TAKE 1 TABLET BY MOUTH  DAILY 11/25/18   Biagio Borg, MD  mesalamine (APRISO) 0.375 g 24 hr capsule Take 4 capsules (1.5 g total) by mouth daily. Take 4 tablets all at one time. 02/08/19   Ladene Artist, MD  pantoprazole (PROTONIX) 40 MG  tablet Take 1 tablet (40 mg total) by mouth daily. 02/08/19   Ladene Artist, MD  zolpidem (AMBIEN) 10 MG tablet TAKE 1 TABLET BY MOUTH AT BEDTIME AS NEEDED FOR SLEEP. Patient taking differently: Take 10 mg by mouth at bedtime.  03/02/19   Biagio Borg, MD    Family History Family History  Problem Relation Age of Onset   COPD Mother    Cerebral aneurysm Mother    COPD Father    Colon cancer Neg Hx    Esophageal cancer Neg Hx    Rectal cancer Neg Hx    Stomach cancer Neg Hx    Colon polyps Neg Hx     Social History Social History   Tobacco Use   Smoking status: Former Smoker    Quit date: 09/08/1972    Years since quitting: 46.7   Smokeless tobacco: Former Systems developer    Types: Chew  Substance Use Topics   Alcohol use: Not Currently    Alcohol/week: 5.0 standard drinks    Types: 5 Cans of beer per week   Drug use: No     Allergies   Wasp venom, Contrast media [iodinated diagnostic agents], and Statins   Review of Systems Review of Systems  Constitutional: Positive for appetite change, chills, fatigue and fever.  Respiratory: Positive for cough.   Cardiovascular: Negative for chest pain.  Gastrointestinal: Positive for diarrhea and vomiting. Negative for abdominal pain and blood in stool.  Neurological: Positive for headaches.  All other systems reviewed and are negative.    Physical Exam Updated Vital Signs BP 140/70 (BP Location: Left Arm)    Pulse 89    Temp 97.9 F (36.6 C) (Oral)    Resp (!) 24    SpO2 92%   Physical Exam CONSTITUTIONAL: Elderly, no acute distress HEAD: Normocephalic/atraumatic EYES: EOMI/PERRL ENMT: Mucous membranes moist NECK: supple no meningeal signs SPINE/BACK:entire spine nontender CV: S1/S2 noted, no murmurs/rubs/gallops noted LUNGS: Scattered coarse breath sounds,, no acute distress ABDOMEN: soft, nontender, no rebound or guarding, bowel sounds noted throughout abdomen GU:no cva tenderness NEURO: Pt is  awake/alert/appropriate, moves all extremitiesx4.  No facial droop.   EXTREMITIES: pulses normal/equal, full ROM SKIN: warm, color normal PSYCH: no abnormalities of mood noted, alert and oriented to situation   ED Treatments / Results  Labs (all labs ordered are listed, but only abnormal results are displayed) Labs Reviewed  SARS CORONAVIRUS 2 (HOSPITAL ORDER, [facility]) - Abnormal; Notable for the following components:      Result Value   SARS Coronavirus 2 POSITIVE (*)    All other components within normal limits  COMPREHENSIVE METABOLIC PANEL - Abnormal; Notable for the following components:   Sodium 129 (*)    Potassium 5.4 (*)    CO2 18 (*)  Glucose, Bld 113 (*)    Creatinine, Ser 1.28 (*)    Calcium 8.0 (*)    Total Protein 6.1 (*)    Albumin 3.0 (*)    AST 49 (*)    Total Bilirubin 3.1 (*)    GFR calc non Af Amer 54 (*)    All other components within normal limits  CBC - Abnormal; Notable for the following components:   RDW 15.9 (*)    All other components within normal limits  LIPASE, BLOOD    EKG None  Radiology Dg Chest Port 1 View  Result Date: 05/28/2019 CLINICAL DATA:  Weakness.  Fever. EXAM: PORTABLE CHEST 1 VIEW COMPARISON:  05/24/2015 FINDINGS: There are chronic appearing prominent interstitial lung markings primarily at the lung bases bilaterally. There is no pneumothorax. There is no focal infiltrate. The heart size is stable from prior studies. There is no acute osseous abnormality. IMPRESSION: No active disease. Electronically Signed   By: Constance Holster M.D.   On: 05/28/2019 01:47    Procedures Procedures   Medications Ordered in ED Medications  sodium chloride flush (NS) 0.9 % injection 3 mL (has no administration in time range)  sodium chloride 0.9 % bolus 1,000 mL (has no administration in time range)  dexamethasone (DECADRON) injection 6 mg (has no administration in time range)  sodium chloride 0.9 % bolus  1,000 mL (0 mLs Intravenous Stopped 05/28/19 0649)  ondansetron (ZOFRAN) injection 4 mg (4 mg Intravenous Given 05/28/19 0436)     Initial Impression / Assessment and Plan / ED Course  I have reviewed the triage vital signs and the nursing notes.  Pertinent labs & imaging results that were available during my care of the patient were reviewed by me and considered in my medical decision making (see chart for details).        3:36 AM Patient reports vomiting diarrhea for the past 2 weeks. He also reports cough but he reports he gets this frequently. He reports family members with COVID-19 He is noted to be dehydrated here.  He will be screened for COVID-19. 4:55 AM Pt now becoming more SOB, requiring oxygen, will repeat CXR to evaluate for any change 7:03 AM Patient found to be positive for COVID-19.  He has a new oxygen requirement. He will be admitted for dehydration COVID-19. IV steroids been ordered. Updated patient and daughter on plan 7:10 AM Signed out to dr Billy Fischer with admission pending  [name] [name] was evaluated in Emergency Department on 05/28/2019 for the symptoms described in the history of present illness. He was evaluated in the context of the global COVID-19 pandemic, which necessitated consideration that the patient might be at risk for infection with the SARS-CoV-2 virus that causes COVID-19. Institutional protocols and algorithms that pertain to the evaluation of patients at risk for COVID-19 are in a state of rapid change based on information released by regulatory bodies including the CDC and federal and state organizations. These policies and algorithms were followed during the patient's care in the ED.  Final Clinical Impressions(s) / ED Diagnoses   Final diagnoses:  Pneumonia due to COVID-19 virus  Dehydration    ED Discharge Orders    None       Ripley Fraise, MD 05/28/19 0710

## 2019-05-28 NOTE — ED Notes (Signed)
Xray technician left pt in room 1 of [facility] to avoid any further exposure to pts in lobby

## 2019-05-28 NOTE — Plan of Care (Signed)
Admit received from [facility]. Continue w ordered POC

## 2019-05-28 NOTE — ED Notes (Signed)
Family took pt belongings. Including wallet, clothes and shoes.

## 2019-05-28 NOTE — TOC Initial Note (Signed)
Transition of Care [facility]) - Initial/Assessment Note    Patient Details  Name: [name] [name] MRN: [id number] Date of Birth: 1941-08-21  [facility] CM/SW Contact:    Ninfa Meeker, RN Phone Number: [phone] (working remotely) 05/28/2019, 12:56 PM  Clinical Narrative:  78 yr old male admitted from home with COVID 19. Several family members are positive. Case manager will continue to follow for appropriate disposition as he medically improves. May he be blessed to do so.                      Patient Goals and CMS Choice        Expected Discharge Plan and Services                                                Prior Living Arrangements/Services                       Activities of Daily Living Home Assistive Devices/Equipment: None ADL Screening (condition at time of admission) Patient's cognitive ability adequate to safely complete daily activities?: Yes Is the patient deaf or have difficulty hearing?: Yes Does the patient have difficulty seeing, even when wearing glasses/contacts?: No Does the patient have difficulty concentrating, remembering, or making decisions?: No Patient able to express need for assistance with ADLs?: Yes Does the patient have difficulty dressing or bathing?: No Independently performs ADLs?: Yes (appropriate for developmental age) Does the patient have difficulty walking or climbing stairs?: Yes Weakness of Legs: Both Weakness of Arms/Hands: None  Permission Sought/Granted                  Emotional Assessment              Admission diagnosis:  Dehydration [E86.0] Pain [R52] Pneumonia due to COVID-19 virus [U07.1, J12.89] Patient Active Problem List   Diagnosis Date Noted  . COVID-19 virus infection 05/28/2019  . Hyperkalemia 05/28/2019  . Hyponatremia 05/28/2019  . Transaminitis 05/28/2019  . Right inguinal hernia 04/07/2019  . Dyspnea 01/28/2019  . Acute sinusitis 11/18/2018  .  Wheezing 11/18/2018  . PAD (peripheral artery disease) ([place]) 07/19/2018  . PVD (peripheral vascular disease) ([place]) 05/04/2018  . Spinal stenosis of lumbar region 11/05/2016  . Spinal stenosis at L4-L5 level 11/05/2016  . Mastalgia 04/10/2016  . B12 deficiency 10/11/2014  . Bilateral leg pain 10/10/2014  . Double vision with both eyes open 10/13/2013  . Stenosis of left carotid artery, mod to severe by dopplers 09/29/2012  . S/P angioplasty with stent, and TurboHawk athrectomy mid lt. SFA for life style limiting claudication 09/28/12 09/29/2012  . PVD (peripheral vascular disease) with claudication, lifestyle limiting, ABI rt. 0.67, lt. 0.73 07/12/2012  . Thromboembolism, distal  Rt SFA  treated with aspiration thrombectomy, 07/12/12 07/12/2012  . S/P insertion of iliac artery stent, external iliac and RT SFA  with DB athrectomy 07/12/12 07/12/2012  . Impaired glucose tolerance 04/13/2011  . Encounter for well adult exam with abnormal findings 04/13/2011  . Chronic universal ulcerative colitis ([place]) 11/14/2009  . VITAMIN D DEFICIENCY 11/13/2009  . Depression 10/16/2009  . SHOULDER PAIN, LEFT 10/16/2009  . HIP PAIN, RIGHT 10/16/2009  . Gynecomastia 02/16/2009  . BACK PAIN 01/10/2009  . INSOMNIA-SLEEP DISORDER-UNSPEC 10/10/2008  . Chest pain 10/10/2008  . NECK PAIN 05/31/2008  .  Hx of ulcerative colitis 04/24/2008  . Anal fissure 04/24/2008  . Personal history of colonic polyps 04/20/2008  . CARBUNCLE, NECK 02/14/2008  . BARRETTS ESOPHAGUS 08/16/2007  . Hyperlipidemia 06/28/2007  . Essential hypertension 06/28/2007  . GERD 06/28/2007  . DIVERTICULOSIS, COLON 06/28/2007  . Lower back pain 06/28/2007  . NEPHROLITHIASIS, HX OF 06/28/2007   PCP:  Biagio Borg, MD Pharmacy:   [facility] [address] [place] [address] Phone: [phone] Fax: [phone]     Social Determinants of Health (SDOH) Interventions     Readmission Risk Interventions Readmission Risk Prevention Plan 07/20/2018  Post Dischage Appt Complete  Medication Screening Complete  Transportation Screening Complete  PCP follow-up Complete  Some recent data might be hidden

## 2019-05-29 DIAGNOSIS — J1289 Other viral pneumonia: Secondary | ICD-10-CM

## 2019-05-29 DIAGNOSIS — E860 Dehydration: Secondary | ICD-10-CM

## 2019-05-29 LAB — COMPREHENSIVE METABOLIC PANEL
ALT: 18 U/L (ref 0–44)
AST: 33 U/L (ref 15–41)
Albumin: 2.6 g/dL — ABNORMAL LOW (ref 3.5–5.0)
Alkaline Phosphatase: 54 U/L (ref 38–126)
Anion gap: 7 (ref 5–15)
BUN: 21 mg/dL (ref 8–23)
CO2: 21 mmol/L — ABNORMAL LOW (ref 22–32)
Calcium: 8.1 mg/dL — ABNORMAL LOW (ref 8.9–10.3)
Chloride: 108 mmol/L (ref 98–111)
Creatinine, Ser: 0.88 mg/dL (ref 0.61–1.24)
GFR calc Af Amer: >60 mL/min (ref >60)
GFR calc non Af Amer: >60 mL/min (ref >60)
Glucose, Bld: 132 mg/dL — ABNORMAL HIGH (ref 70–99)
Potassium: 4.3 mmol/L (ref 3.5–5.1)
Sodium: 136 mmol/L (ref 135–145)
Total Bilirubin: 0.7 mg/dL (ref 0.3–1.2)
Total Protein: 5.5 g/dL — ABNORMAL LOW (ref 6.5–8.1)

## 2019-05-29 LAB — CBC WITH DIFFERENTIAL/PLATELET
Abs Immature Granulocytes: 0.1 10*3/uL — ABNORMAL HIGH (ref 0.00–0.07)
Basophils Absolute: 0 10*3/uL (ref 0.0–0.1)
Basophils Relative: 0 %
Eosinophils Absolute: 0 10*3/uL (ref 0.0–0.5)
Eosinophils Relative: 0 %
HCT: 40.3 % (ref 39.0–52.0)
Hemoglobin: 13.3 g/dL (ref 13.0–17.0)
Immature Granulocytes: 2 %
Lymphocytes Relative: 4 %
Lymphs Abs: 0.2 10*3/uL — ABNORMAL LOW (ref 0.7–4.0)
MCH: 27.8 pg (ref 26.0–34.0)
MCHC: 33 g/dL (ref 30.0–36.0)
MCV: 84.3 fL (ref 80.0–100.0)
Monocytes Absolute: 0.4 10*3/uL (ref 0.1–1.0)
Monocytes Relative: 7 %
Neutro Abs: 5.4 10*3/uL (ref 1.7–7.7)
Neutrophils Relative %: 87 %
Platelets: 187 10*3/uL (ref 150–400)
RBC: 4.78 MIL/uL (ref 4.22–5.81)
RDW: 15.6 % — ABNORMAL HIGH (ref 11.5–15.5)
WBC: 6.1 10*3/uL (ref 4.0–10.5)
nRBC: 0 % (ref 0.0–0.2)

## 2019-05-29 LAB — D-DIMER, QUANTITATIVE: D-Dimer, Quant: 0.75 ug/mL-FEU — ABNORMAL HIGH (ref 0.00–0.50)

## 2019-05-29 LAB — C-REACTIVE PROTEIN: CRP: 16.3 mg/dL — ABNORMAL HIGH (ref <1.0)

## 2019-05-29 LAB — MAGNESIUM: Magnesium: 1.9 mg/dL (ref 1.7–2.4)

## 2019-05-29 LAB — FERRITIN: Ferritin: 307 ng/mL (ref 24–336)

## 2019-05-29 MED ORDER — MESALAMINE ER 250 MG PO CPCR
1500.0000 mg | ORAL_CAPSULE | Freq: Every day | ORAL | Status: DC
  Administered 2019-05-29 – 2019-06-05 (×8): 1500 mg via ORAL
  Filled 2019-05-29 (×9): qty 6

## 2019-05-29 MED ORDER — ALLOPURINOL 100 MG PO TABS: 200.0000 mg | ORAL_TABLET | Freq: Every day | ORAL | Status: DC

## 2019-05-29 MED ORDER — GABAPENTIN 100 MG PO CAPS
100.0000 mg | ORAL_CAPSULE | Freq: Every day | ORAL | Status: DC
  Administered 2019-05-29 – 2019-06-05 (×8): 100 mg via ORAL
  Filled 2019-05-29 (×8): qty 1

## 2019-05-29 MED ORDER — ZOLPIDEM TARTRATE 5 MG PO TABS
5.0000 mg | ORAL_TABLET | Freq: Every evening | ORAL | Status: DC | PRN
  Administered 2019-05-29 – 2019-06-05 (×8): 5 mg via ORAL
  Filled 2019-05-29 (×8): qty 1

## 2019-05-29 MED ORDER — LISINOPRIL 20 MG PO TABS
20.0000 mg | ORAL_TABLET | Freq: Every day | ORAL | Status: DC
  Administered 2019-05-29 – 2019-06-06 (×9): 20 mg via ORAL
  Filled 2019-05-29 (×9): qty 1

## 2019-05-29 MED ORDER — VITAMIN B-12 1000 MCG PO TABS
1000.0000 ug | ORAL_TABLET | Freq: Every day | ORAL | Status: DC
  Administered 2019-05-29 – 2019-06-06 (×9): 1000 ug via ORAL
  Filled 2019-05-29 (×9): qty 1

## 2019-05-29 MED ORDER — CLOPIDOGREL BISULFATE 75 MG PO TABS
75.0000 mg | ORAL_TABLET | Freq: Every day | ORAL | Status: DC
  Administered 2019-05-29 – 2019-06-06 (×9): 75 mg via ORAL
  Filled 2019-05-29 (×9): qty 1

## 2019-05-29 NOTE — Plan of Care (Signed)
Will continue with plan of care.

## 2019-05-29 NOTE — Progress Notes (Signed)
PROGRESS NOTE  [name] [name] TGG:[id number] DOB: 1941-09-01 DOA: 05/27/2019 PCP: Biagio Borg, MD   LOS: 1 day   Brief Narrative / Interim history: 78 year old male with history of hypertension, hyperlipidemia, COPD, PVD, Barrett's esophagus, GERD, who came to the hospital and was admitted on 05/27/2019 with complaints of nausea, vomiting diarrhea for the past couple of weeks.  Reportedly he was exposed to family members with his positive for Covid, and he ended up being positive himself.  He reports 3 watery bowel movements per day on average.  He also reports poor p.o. intake for the past 4 to 5 days.  He has had a headache, and fever up to 100 at home.  He denies any significant shortness of breath more so than his baseline.  He has a degree of dyspnea for which she is on Symbicort and albuterol at home.  He denies any cough or chest congestion.   Subjective / Interval events: -Continues to have poor appetite but has not had a bowel movement in the past 24 hours.  Denies any chest pain.  Denies any shortness of breath.  Assessment & Plan: Principal Problem:   COVID-19 virus infection Active Problems:   Hyperlipidemia   Essential hypertension   GERD   Hyperkalemia   Hyponatremia   Transaminitis   Principal Problem Acute hypoxic respiratory failure due to COVID-19 infection -Very mildly hypoxic, 88% requiring 2 L to keep above 90%.  May very well be related to his COPD rather than COVID-19 given chest x-ray without acute findings but evidence of chronic changes related to his COPD/emphysema. -Continue oxygen as needed, wean off to room air as tolerated -Patient was started on Remdesivir on 9/19, continue for 5 days with last treatment date Wednesday, September 23 -He was also started on Decadron 6 mg daily, continue -Continue inhalers, vitamin C, zinc -Monitor inflammatory markers  COVID-19 Labs  Recent Labs    05/28/19 0859 05/29/19 0200  DDIMER 0.68* 0.75*  FERRITIN 320  307  LDH 252*  --   CRP 19.9* 16.3*    Lab Results  Component Value Date   SARSCOV2NAA POSITIVE (A) 05/28/2019    Active Problems Nausea, vomiting, diarrhea, poor p.o. intake -Suspect secondary to COVID-19 infection, antiemetics as needed -Advance diet as tolerated  Slight creatinine elevation -Baseline creatinine around 1.0-1.1, he was 1.28 on admission likely due to poor p.o. intake, received IV fluid and normalized to 0.88 this morning.  Not consistent with acute kidney injury.  Hyperkalemia -Resolved with fluids  Hyponatremia -Due to poor p.o. intake, resolved with fluids  Hypertension -Resume home medications today, blood pressure stable  COPD/emphysema -Stable chest x-ray, no wheezing, continue inhalers  Elevated LFTs -Possibly related to viral infection, normalized this morning  GERD, Barrett's esophagus -Continue PPI   Scheduled Meds: . clopidogrel  75 mg Oral Daily  . dexamethasone (DECADRON) injection  6 mg Intravenous Q24H  . enoxaparin (LOVENOX) injection  40 mg Subcutaneous Q24H  . gabapentin  100 mg Oral QHS  . Ipratropium-Albuterol  1 puff Inhalation Q6H  . lisinopril  20 mg Oral Daily  . mesalamine  1,500 mg Oral QHS  . mometasone-formoterol  2 puff Inhalation BID  . pantoprazole (PROTONIX) IV  40 mg Intravenous QHS  . sodium chloride flush  3 mL Intravenous Q12H  . cyanocobalamin  1,000 mcg Oral Daily  . vitamin C  500 mg Oral Daily  . zinc sulfate  220 mg Oral Daily   Continuous Infusions: . sodium chloride  1,000 mL (05/29/19 0336)  . remdesivir 100 mg in NS 250 mL     PRN Meds:.acetaminophen, chlorpheniramine-HYDROcodone, guaiFENesin-dextromethorphan, hydrALAZINE, ondansetron **OR** ondansetron (ZOFRAN) IV, zolpidem  DVT prophylaxis: Lovenox Code Status: Full code Family Communication: d/w daughter Arrie Aran over the phone [phone]) Disposition Plan: home when finishes Remdesivir   Consultants:  None  Procedures:   Microbiology   None   Antimicrobials: None     Objective: Vitals:   05/28/19 1500 05/28/19 2052 05/29/19 0414 05/29/19 0830  BP: (!) 148/78 (!) 145/73 129/68 (!) 148/63  Pulse: 92 72 60 71  Resp: 20 20 16 20   Temp: 98 F (36.7 C) 98.2 F (36.8 C) 98 F (36.7 C) 97.8 F (36.6 C)  TempSrc: Oral Oral Oral Oral  SpO2: (!) 87% 93% 94% 94%  Weight:      Height:        Intake/Output Summary (Last 24 hours) at 05/29/2019 0954 Last data filed at 05/28/2019 1803 Gross per 24 hour  Intake 1400 ml  Output 600 ml  Net 800 ml   Filed Weights   05/28/19 1225  Weight: 103.4 kg    Examination:  Constitutional: NAD Eyes: PERRL, lids and conjunctivae normal ENMT: Mucous membranes are moist. No oropharyngeal exudates Neck: normal, supple Respiratory: clear to auscultation bilaterally, no wheezing, no crackles. Normal respiratory effort.  Overall distant breath sounds Cardiovascular: Regular rate and rhythm, no murmurs / rubs / gallops. Abdomen: no tenderness. Bowel sounds positive.  Musculoskeletal: no clubbing / cyanosis.  Skin: no rashes, lesions, ulcers. No induration Neurologic: CN 2-12 grossly intact. Strength 5/5 in all 4.  Psychiatric: Normal judgment and insight. Alert and oriented x 3. Normal mood.    Data Reviewed: I have independently reviewed following labs and imaging studies chest x-ray 9/19-no infiltrates  CBC: Recent Labs  Lab 05/27/19 1536 05/29/19 0200  WBC 7.5 6.1  NEUTROABS  --  5.4  HGB 15.0 13.3  HCT 44.1 40.3  MCV 83.1 84.3  PLT 183 277   Basic Metabolic Panel: Recent Labs  Lab 05/27/19 1536 05/28/19 0859 05/29/19 0200  NA 129*  --  136  K 5.4* 4.1 4.3  CL 98  --  108  CO2 18*  --  21*  GLUCOSE 113*  --  132*  BUN 22  --  21  CREATININE 1.28*  --  0.88  CALCIUM 8.0*  --  8.1*  MG  --   --  1.9   GFR: Estimated Creatinine Clearance: 81.9 mL/min (by C-G formula based on SCr of 0.88 mg/dL). Liver Function Tests: Recent Labs  Lab 05/27/19 1536  05/29/19 0200  AST 49* 33  ALT 22 18  ALKPHOS 66 54  BILITOT 3.1* 0.7  PROT 6.1* 5.5*  ALBUMIN 3.0* 2.6*   Recent Labs  Lab 05/27/19 1536  LIPASE 24   No results for input(s): AMMONIA in the last 168 hours. Coagulation Profile: No results for input(s): INR, PROTIME in the last 168 hours. Cardiac Enzymes: No results for input(s): CKTOTAL, CKMB, CKMBINDEX, TROPONINI in the last 168 hours. BNP (last 3 results) Recent Labs    02/07/19 0954  PROBNP 57.0   HbA1C: No results for input(s): HGBA1C in the last 72 hours. CBG: No results for input(s): GLUCAP in the last 168 hours. Lipid Profile: No results for input(s): CHOL, HDL, LDLCALC, TRIG, CHOLHDL, LDLDIRECT in the last 72 hours. Thyroid Function Tests: No results for input(s): TSH, T4TOTAL, FREET4, T3FREE, THYROIDAB in the last 72 hours. Anemia Panel: Recent Labs  05/28/19 0859 05/29/19 0200  FERRITIN 320 307   Urine analysis:    Component Value Date/Time   COLORURINE YELLOW 02/07/2019 [place] 02/07/2019 0954   LABSPEC 1.020 02/07/2019 0954   PHURINE 6.0 02/07/2019 0954   GLUCOSEU NEGATIVE 02/07/2019 0954   HGBUR NEGATIVE 02/07/2019 0954   BILIRUBINUR NEGATIVE 02/07/2019 0954   KETONESUR NEGATIVE 02/07/2019 0954   PROTEINUR NEGATIVE 07/12/2018 1506   UROBILINOGEN 0.2 02/07/2019 0954   NITRITE NEGATIVE 02/07/2019 0954   LEUKOCYTESUR NEGATIVE 02/07/2019 0954   Sepsis Labs: Invalid input(s): PROCALCITONIN, LACTICIDVEN  Recent Results (from the past 240 hour(s))  SARS Coronavirus 2 [facility] order, Performed in [facility] hospital lab) Nasopharyngeal Nasopharyngeal Swab     Status: Abnormal   Collection Time: 05/28/19  4:38 AM   Specimen: Nasopharyngeal Swab  Result Value Ref Range Status   SARS Coronavirus 2 POSITIVE (A) NEGATIVE Final    Comment: RESULT CALLED TO, READ BACK BY AND VERIFIED WITH: RN M BROOKS @ [phone] 05/28/19 BY S GEZAHEGN (NOTE) If result is NEGATIVE SARS-CoV-2 target  nucleic acids are NOT DETECTED. The SARS-CoV-2 RNA is generally detectable in upper and lower  respiratory specimens during the acute phase of infection. The lowest  concentration of SARS-CoV-2 viral copies this assay can detect is 250  copies / mL. A negative result does not preclude SARS-CoV-2 infection  and should not be used as the sole basis for treatment or other  patient management decisions.  A negative result may occur with  improper specimen collection / handling, submission of specimen other  than nasopharyngeal swab, presence of viral mutation(s) within the  areas targeted by this assay, and inadequate number of viral copies  (<250 copies / mL). A negative result must be combined with clinical  observations, patient history, and epidemiological information. If result is POSITIVE SARS-CoV-2 target nucleic acids are DETECTE D. The SARS-CoV-2 RNA is generally detectable in upper and lower  respiratory specimens during the acute phase of infection.  Positive  results are indicative of active infection with SARS-CoV-2.  Clinical  correlation with patient history and other diagnostic information is  necessary to determine patient infection status.  Positive results do  not rule out bacterial infection or co-infection with other viruses. If result is PRESUMPTIVE POSTIVE SARS-CoV-2 nucleic acids MAY BE PRESENT.   A presumptive positive result was obtained on the submitted specimen  and confirmed on repeat testing.  While 2019 novel coronavirus  (SARS-CoV-2) nucleic acids may be present in the submitted sample  additional confirmatory testing may be necessary for epidemiological  and / or clinical management purposes  to differentiate between  SARS-CoV-2 and other Sarbecovirus currently known to infect humans.  If clinically indicated additional testing with an alternate test  methodology (LAB745 3) is advised. The SARS-CoV-2 RNA is generally  detectable in upper and lower  respiratory specimens during the acute  phase of infection. The expected result is Negative. Fact Sheet for Patients:  StrictlyIdeas.no Fact Sheet for Healthcare Providers: BankingDealers.co.za This test is not yet approved or cleared by the [place] FDA and has been authorized for detection and/or diagnosis of SARS-CoV-2 by FDA under an Emergency Use Authorization (EUA).  This EUA will remain in effect (meaning this test can be used) for the duration of the COVID-19 declaration under Section 564(b)(1) of the Act, 21 U.S.C. section 360bbb-3(b)(1), unless the authorization is terminated or revoked sooner. Performed at [facility], [place] [address], [place], [place] [address]  Radiology Studies: Dg Chest Port 1 View  Result Date: 05/28/2019 CLINICAL DATA:  Headaches, nausea, diarrhea, unable to eat x 2 weeks. Exposed to 4 family members that were diagnosed Covid positive with similar symptoms EXAM: PORTABLE CHEST 1 VIEW COMPARISON:  05/28/2019 at 1:17 a.m., and older exams. FINDINGS: Cardiac silhouette is normal in size. No mediastinal or hilar masses. Lungs show interstitial thickening with a basilar predominance, unchanged. There is relative lucency in the upper lobes suggesting emphysema. No evidence of pneumonia or pulmonary edema. No convincing pleural effusion and no pneumothorax. IMPRESSION: 1. No acute cardiopulmonary disease and no change from the earlier study. 2. Chronic interstitial thickening with the basilar predominance. Findings suggest emphysema. Electronically Signed   By: Lajean Manes M.D.   On: 05/28/2019 05:43   Dg Chest Port 1 View  Result Date: 05/28/2019 CLINICAL DATA:  Weakness.  Fever. EXAM: PORTABLE CHEST 1 VIEW COMPARISON:  05/24/2015 FINDINGS: There are chronic appearing prominent interstitial lung markings primarily at the lung bases bilaterally. There is no pneumothorax. There is no focal infiltrate.  The heart size is stable from prior studies. There is no acute osseous abnormality. IMPRESSION: No active disease. Electronically Signed   By: Constance Holster M.D.   On: 05/28/2019 01:47    Marzetta Board, MD, PhD [facility]  Contact via  [email or web address]  [place] P: [phone] F: [phone]

## 2019-05-30 ENCOUNTER — Ambulatory Visit: Payer: Medicare Other | Admitting: Family Medicine

## 2019-05-30 LAB — CBC WITH DIFFERENTIAL/PLATELET
Abs Immature Granulocytes: 0.1 10*3/uL — ABNORMAL HIGH (ref 0.00–0.07)
Basophils Absolute: 0 10*3/uL (ref 0.0–0.1)
Basophils Relative: 0 %
Eosinophils Absolute: 0 10*3/uL (ref 0.0–0.5)
Eosinophils Relative: 0 %
HCT: 44.1 % (ref 39.0–52.0)
Hemoglobin: 14.6 g/dL (ref 13.0–17.0)
Immature Granulocytes: 1 %
Lymphocytes Relative: 3 %
Lymphs Abs: 0.3 10*3/uL — ABNORMAL LOW (ref 0.7–4.0)
MCH: 27.7 pg (ref 26.0–34.0)
MCHC: 33.1 g/dL (ref 30.0–36.0)
MCV: 83.5 fL (ref 80.0–100.0)
Monocytes Absolute: 0.5 10*3/uL (ref 0.1–1.0)
Monocytes Relative: 5 %
Neutro Abs: 9.1 10*3/uL — ABNORMAL HIGH (ref 1.7–7.7)
Neutrophils Relative %: 91 %
Platelets: 227 10*3/uL (ref 150–400)
RBC: 5.28 MIL/uL (ref 4.22–5.81)
RDW: 15.9 % — ABNORMAL HIGH (ref 11.5–15.5)
WBC: 9.9 10*3/uL (ref 4.0–10.5)
nRBC: 0 % (ref 0.0–0.2)

## 2019-05-30 LAB — COMPREHENSIVE METABOLIC PANEL
ALT: 26 U/L (ref 0–44)
AST: 38 U/L (ref 15–41)
Albumin: 2.9 g/dL — ABNORMAL LOW (ref 3.5–5.0)
Alkaline Phosphatase: 56 U/L (ref 38–126)
Anion gap: 9 (ref 5–15)
BUN: 27 mg/dL — ABNORMAL HIGH (ref 8–23)
CO2: 20 mmol/L — ABNORMAL LOW (ref 22–32)
Calcium: 8.5 mg/dL — ABNORMAL LOW (ref 8.9–10.3)
Chloride: 108 mmol/L (ref 98–111)
Creatinine, Ser: 0.86 mg/dL (ref 0.61–1.24)
GFR calc Af Amer: >60 mL/min (ref >60)
GFR calc non Af Amer: >60 mL/min (ref >60)
Glucose, Bld: 117 mg/dL — ABNORMAL HIGH (ref 70–99)
Potassium: 4.2 mmol/L (ref 3.5–5.1)
Sodium: 137 mmol/L (ref 135–145)
Total Bilirubin: 0.6 mg/dL (ref 0.3–1.2)
Total Protein: 6 g/dL — ABNORMAL LOW (ref 6.5–8.1)

## 2019-05-30 LAB — FERRITIN: Ferritin: 299 ng/mL (ref 24–336)

## 2019-05-30 LAB — MAGNESIUM: Magnesium: 1.9 mg/dL (ref 1.7–2.4)

## 2019-05-30 LAB — D-DIMER, QUANTITATIVE: D-Dimer, Quant: 0.84 ug/mL-FEU — ABNORMAL HIGH (ref 0.00–0.50)

## 2019-05-30 LAB — C-REACTIVE PROTEIN: CRP: 8.1 mg/dL — ABNORMAL HIGH (ref <1.0)

## 2019-05-30 MED ORDER — HYDROCHLOROTHIAZIDE 25 MG PO TABS
25.0000 mg | ORAL_TABLET | Freq: Every day | ORAL | Status: DC
  Administered 2019-05-30 – 2019-06-06 (×8): 25 mg via ORAL
  Filled 2019-05-30 (×8): qty 1

## 2019-05-30 NOTE — Progress Notes (Signed)
PROGRESS NOTE  [name] [name] PIR:[id number] DOB: Jan 07, 1941 DOA: 05/27/2019 PCP: Biagio Borg, MD   LOS: 2 days   Brief Narrative / Interim history: 78 year old male with history of hypertension, hyperlipidemia, COPD, PVD, Barrett's esophagus, GERD, who came to the hospital and was admitted on 05/27/2019 with complaints of nausea, vomiting diarrhea for the past couple of weeks.  Reportedly he was exposed to family members with his positive for Covid, and he ended up being positive himself.  He reports 3 watery bowel movements per day on average.  He also reports poor p.o. intake for the past 4 to 5 days.  He has had a headache, and fever up to 100 at home.  He denies any significant shortness of breath more so than his baseline.  He has a degree of dyspnea for which she is on Symbicort and albuterol at home.  He denies any cough or chest congestion.   Subjective / Interval events: -Doing well this morning, feels a little bit better, appetite is back.  Has not had a diarrheal bowel movement since admission.  No nausea or vomiting.  Assessment & Plan: Principal Problem:   COVID-19 virus infection Active Problems:   Hyperlipidemia   Essential hypertension   GERD   Hyperkalemia   Hyponatremia   Transaminitis   Principal Problem Acute hypoxic respiratory failure due to COVID-19 infection -Very mildly hypoxic, 88% requiring 2 L to keep above 90%.  May very well be related to his COPD rather than COVID-19 given chest x-ray without acute findings but evidence of chronic changes related to his COPD/emphysema. -Continue oxygen as needed, wean off to room air as tolerated -Patient was started on Remdesivir on 9/19, continue for 5 days with last treatment date Wednesday, September 23 -He was also started on Decadron 6 mg daily, continue -Continue inhalers, vitamin C, zinc -Monitor inflammatory markers, they seem to be improving today  COVID-19 Labs  Recent Labs    05/28/19 0859 05/29/19  0200 05/30/19 0255  DDIMER 0.68* 0.75* 0.84*  FERRITIN 320 307 299  LDH 252*  --   --   CRP 19.9* 16.3* 8.1*    Lab Results  Component Value Date   SARSCOV2NAA POSITIVE (A) 05/28/2019    Active Problems Nausea, vomiting, diarrhea, poor p.o. intake -Suspect secondary to COVID-19 infection, antiemetics as needed -Resolved, tolerating a regular diet  Slight creatinine elevation -Baseline creatinine around 1.0-1.1, he was 1.28 on admission likely due to poor p.o. intake -Creatinine now normalized after fluids.  Discontinue IV fluids encourage p.o. intake  Hyperkalemia -Resolved with fluids  Hyponatremia -Due to poor p.o. intake, resolved with fluids  Hypertension -Resume home medications today, blood pressure stable  COPD/emphysema -Stable chest x-ray, no wheezing, continue inhalers  Elevated LFTs -Possibly related to viral infection, normalized this morning  GERD, Barrett's esophagus -Continue PPI   Scheduled Meds: . clopidogrel  75 mg Oral Daily  . dexamethasone (DECADRON) injection  6 mg Intravenous Q24H  . enoxaparin (LOVENOX) injection  40 mg Subcutaneous Q24H  . gabapentin  100 mg Oral QHS  . Ipratropium-Albuterol  1 puff Inhalation Q6H  . lisinopril  20 mg Oral Daily  . mesalamine  1,500 mg Oral QHS  . mometasone-formoterol  2 puff Inhalation BID  . pantoprazole (PROTONIX) IV  40 mg Intravenous QHS  . sodium chloride flush  3 mL Intravenous Q12H  . cyanocobalamin  1,000 mcg Oral Daily  . vitamin C  500 mg Oral Daily  . zinc sulfate  220  mg Oral Daily   Continuous Infusions: . remdesivir 100 mg in NS 250 mL 100 mg (05/30/19 0937)   PRN Meds:.acetaminophen, chlorpheniramine-HYDROcodone, guaiFENesin-dextromethorphan, hydrALAZINE, ondansetron **OR** ondansetron (ZOFRAN) IV, zolpidem  DVT prophylaxis: Lovenox Code Status: Full code Family Communication: d/w daughter Arrie Aran over the phone [phone]) Disposition Plan: home when finishes Remdesivir    Consultants:  None  Procedures:   Microbiology  None   Antimicrobials: None     Objective: Vitals:   05/29/19 1600 05/29/19 2000 05/30/19 0336 05/30/19 0727  BP:  (!) 144/69 (!) 144/84 (!) 142/67  Pulse:  65 65   Resp:  17 18 20   Temp: 98 F (36.7 C) 97.7 F (36.5 C) 97.9 F (36.6 C) 97.8 F (36.6 C)  TempSrc: Oral Oral Oral Oral  SpO2:  90% 94%   Weight:      Height:        Intake/Output Summary (Last 24 hours) at 05/30/2019 1110 Last data filed at 05/30/2019 0700 Gross per 24 hour  Intake -  Output 550 ml  Net -550 ml   Filed Weights   05/28/19 1225  Weight: 103.4 kg    Examination:  Constitutional: No distress, sitting in chair Eyes: No scleral icterus ENMT: Moist mucous membranes Neck: normal, supple Respiratory: Diminished at the bases but overall clear without wheezing or crackles Cardiovascular: Regular rate and rhythm, no murmurs, no edema Abdomen: Soft, nontender, nondistended, bowel sounds positive Musculoskeletal: no clubbing / cyanosis.  Skin: No rashes appreciated Neurologic: Nonfocal, equal strength Psychiatric: Normal judgment and insight. Alert and oriented x 3. Normal mood.    Data Reviewed: I have independently reviewed following labs and imaging studies  CBC: Recent Labs  Lab 05/27/19 1536 05/29/19 0200 05/30/19 0255  WBC 7.5 6.1 9.9  NEUTROABS  --  5.4 9.1*  HGB 15.0 13.3 14.6  HCT 44.1 40.3 44.1  MCV 83.1 84.3 83.5  PLT 183 187 916   Basic Metabolic Panel: Recent Labs  Lab 05/27/19 1536 05/28/19 0859 05/29/19 0200 05/30/19 0255  NA 129*  --  136 137  K 5.4* 4.1 4.3 4.2  CL 98  --  108 108  CO2 18*  --  21* 20*  GLUCOSE 113*  --  132* 117*  BUN 22  --  21 27*  CREATININE 1.28*  --  0.88 0.86  CALCIUM 8.0*  --  8.1* 8.5*  MG  --   --  1.9 1.9   GFR: Estimated Creatinine Clearance: 83.8 mL/min (by C-G formula based on SCr of 0.86 mg/dL). Liver Function Tests: Recent Labs  Lab 05/27/19 1536 05/29/19 0200  05/30/19 0255  AST 49* 33 38  ALT 22 18 26   ALKPHOS 66 54 56  BILITOT 3.1* 0.7 0.6  PROT 6.1* 5.5* 6.0*  ALBUMIN 3.0* 2.6* 2.9*   Recent Labs  Lab 05/27/19 1536  LIPASE 24   No results for input(s): AMMONIA in the last 168 hours. Coagulation Profile: No results for input(s): INR, PROTIME in the last 168 hours. Cardiac Enzymes: No results for input(s): CKTOTAL, CKMB, CKMBINDEX, TROPONINI in the last 168 hours. BNP (last 3 results) Recent Labs    02/07/19 0954  PROBNP 57.0   HbA1C: No results for input(s): HGBA1C in the last 72 hours. CBG: No results for input(s): GLUCAP in the last 168 hours. Lipid Profile: No results for input(s): CHOL, HDL, LDLCALC, TRIG, CHOLHDL, LDLDIRECT in the last 72 hours. Thyroid Function Tests: No results for input(s): TSH, T4TOTAL, FREET4, T3FREE, THYROIDAB in the last  72 hours. Anemia Panel: Recent Labs    05/29/19 0200 05/30/19 0255  FERRITIN 307 299   Urine analysis:    Component Value Date/Time   COLORURINE YELLOW 02/07/2019 [place] 02/07/2019 0954   LABSPEC 1.020 02/07/2019 0954   PHURINE 6.0 02/07/2019 0954   GLUCOSEU NEGATIVE 02/07/2019 0954   HGBUR NEGATIVE 02/07/2019 0954   BILIRUBINUR NEGATIVE 02/07/2019 0954   KETONESUR NEGATIVE 02/07/2019 0954   PROTEINUR NEGATIVE 07/12/2018 1506   UROBILINOGEN 0.2 02/07/2019 0954   NITRITE NEGATIVE 02/07/2019 0954   LEUKOCYTESUR NEGATIVE 02/07/2019 0954   Sepsis Labs: Invalid input(s): PROCALCITONIN, LACTICIDVEN  Recent Results (from the past 240 hour(s))  SARS Coronavirus 2 [facility] order, Performed in [facility] hospital lab) Nasopharyngeal Nasopharyngeal Swab     Status: Abnormal   Collection Time: 05/28/19  4:38 AM   Specimen: Nasopharyngeal Swab  Result Value Ref Range Status   SARS Coronavirus 2 POSITIVE (A) NEGATIVE Final    Comment: RESULT CALLED TO, READ BACK BY AND VERIFIED WITH: RN M BROOKS @ [phone] 05/28/19 BY S GEZAHEGN (NOTE) If result is  NEGATIVE SARS-CoV-2 target nucleic acids are NOT DETECTED. The SARS-CoV-2 RNA is generally detectable in upper and lower  respiratory specimens during the acute phase of infection. The lowest  concentration of SARS-CoV-2 viral copies this assay can detect is 250  copies / mL. A negative result does not preclude SARS-CoV-2 infection  and should not be used as the sole basis for treatment or other  patient management decisions.  A negative result may occur with  improper specimen collection / handling, submission of specimen other  than nasopharyngeal swab, presence of viral mutation(s) within the  areas targeted by this assay, and inadequate number of viral copies  (<250 copies / mL). A negative result must be combined with clinical  observations, patient history, and epidemiological information. If result is POSITIVE SARS-CoV-2 target nucleic acids are DETECTE D. The SARS-CoV-2 RNA is generally detectable in upper and lower  respiratory specimens during the acute phase of infection.  Positive  results are indicative of active infection with SARS-CoV-2.  Clinical  correlation with patient history and other diagnostic information is  necessary to determine patient infection status.  Positive results do  not rule out bacterial infection or co-infection with other viruses. If result is PRESUMPTIVE POSTIVE SARS-CoV-2 nucleic acids MAY BE PRESENT.   A presumptive positive result was obtained on the submitted specimen  and confirmed on repeat testing.  While 2019 novel coronavirus  (SARS-CoV-2) nucleic acids may be present in the submitted sample  additional confirmatory testing may be necessary for epidemiological  and / or clinical management purposes  to differentiate between  SARS-CoV-2 and other Sarbecovirus currently known to infect humans.  If clinically indicated additional testing with an alternate test  methodology (LAB745 3) is advised. The SARS-CoV-2 RNA is generally  detectable  in upper and lower respiratory specimens during the acute  phase of infection. The expected result is Negative. Fact Sheet for Patients:  StrictlyIdeas.no Fact Sheet for Healthcare Providers: BankingDealers.co.za This test is not yet approved or cleared by the [place] FDA and has been authorized for detection and/or diagnosis of SARS-CoV-2 by FDA under an Emergency Use Authorization (EUA).  This EUA will remain in effect (meaning this test can be used) for the duration of the COVID-19 declaration under Section 564(b)(1) of the Act, 21 U.S.C. section 360bbb-3(b)(1), unless the authorization is terminated or revoked sooner. Performed at [facility],  [address], [place], [place] [address]       Radiology Studies: No results found.  Marzetta Board, MD, PhD [facility]  Contact via  [email or web address]  [place] P: [phone] F: [phone]

## 2019-05-30 NOTE — Plan of Care (Signed)
Will continue with plan of care.

## 2019-05-31 LAB — CBC WITH DIFFERENTIAL/PLATELET
Abs Immature Granulocytes: 0.08 10*3/uL — ABNORMAL HIGH (ref 0.00–0.07)
Basophils Absolute: 0 10*3/uL (ref 0.0–0.1)
Basophils Relative: 0 %
Eosinophils Absolute: 0 10*3/uL (ref 0.0–0.5)
Eosinophils Relative: 0 %
HCT: 41.4 % (ref 39.0–52.0)
Hemoglobin: 14.1 g/dL (ref 13.0–17.0)
Immature Granulocytes: 1 %
Lymphocytes Relative: 3 %
Lymphs Abs: 0.3 10*3/uL — ABNORMAL LOW (ref 0.7–4.0)
MCH: 28.2 pg (ref 26.0–34.0)
MCHC: 34.1 g/dL (ref 30.0–36.0)
MCV: 82.8 fL (ref 80.0–100.0)
Monocytes Absolute: 0.6 10*3/uL (ref 0.1–1.0)
Monocytes Relative: 6 %
Neutro Abs: 8.8 10*3/uL — ABNORMAL HIGH (ref 1.7–7.7)
Neutrophils Relative %: 90 %
Platelets: 236 10*3/uL (ref 150–400)
RBC: 5 MIL/uL (ref 4.22–5.81)
RDW: 15.7 % — ABNORMAL HIGH (ref 11.5–15.5)
WBC: 9.7 10*3/uL (ref 4.0–10.5)
nRBC: 0 % (ref 0.0–0.2)

## 2019-05-31 LAB — COMPREHENSIVE METABOLIC PANEL
ALT: 28 U/L (ref 0–44)
AST: 34 U/L (ref 15–41)
Albumin: 2.8 g/dL — ABNORMAL LOW (ref 3.5–5.0)
Alkaline Phosphatase: 54 U/L (ref 38–126)
Anion gap: 11 (ref 5–15)
BUN: 24 mg/dL — ABNORMAL HIGH (ref 8–23)
CO2: 20 mmol/L — ABNORMAL LOW (ref 22–32)
Calcium: 8.3 mg/dL — ABNORMAL LOW (ref 8.9–10.3)
Chloride: 105 mmol/L (ref 98–111)
Creatinine, Ser: 0.81 mg/dL (ref 0.61–1.24)
GFR calc Af Amer: >60 mL/min (ref >60)
GFR calc non Af Amer: >60 mL/min (ref >60)
Glucose, Bld: 134 mg/dL — ABNORMAL HIGH (ref 70–99)
Potassium: 3.7 mmol/L (ref 3.5–5.1)
Sodium: 136 mmol/L (ref 135–145)
Total Bilirubin: 0.8 mg/dL (ref 0.3–1.2)
Total Protein: 5.8 g/dL — ABNORMAL LOW (ref 6.5–8.1)

## 2019-05-31 LAB — MAGNESIUM: Magnesium: 1.8 mg/dL (ref 1.7–2.4)

## 2019-05-31 LAB — C-REACTIVE PROTEIN: CRP: 4.3 mg/dL — ABNORMAL HIGH (ref <1.0)

## 2019-05-31 LAB — FERRITIN: Ferritin: 279 ng/mL (ref 24–336)

## 2019-05-31 LAB — HEPATITIS B SURFACE ANTIGEN: Hepatitis B Surface Ag: NEGATIVE

## 2019-05-31 LAB — D-DIMER, QUANTITATIVE: D-Dimer, Quant: 0.89 ug/mL-FEU — ABNORMAL HIGH (ref 0.00–0.50)

## 2019-05-31 MED ORDER — PANTOPRAZOLE SODIUM 40 MG PO TBEC
40.0000 mg | DELAYED_RELEASE_TABLET | Freq: Every day | ORAL | Status: DC
  Administered 2019-05-31 – 2019-06-06 (×7): 40 mg via ORAL
  Filled 2019-05-31 (×7): qty 1

## 2019-05-31 MED ORDER — FUROSEMIDE 10 MG/ML IJ SOLN
40.0000 mg | Freq: Once | INTRAMUSCULAR | Status: AC
  Administered 2019-05-31: 09:00:00 40 mg via INTRAVENOUS
  Filled 2019-05-31: qty 4

## 2019-05-31 MED ORDER — SENNOSIDES-DOCUSATE SODIUM 8.6-50 MG PO TABS: 2.0000 | ORAL_TABLET | Freq: Every evening | ORAL | Status: DC | PRN

## 2019-05-31 NOTE — Progress Notes (Signed)
PROGRESS NOTE  [name] [name] ZOX:[id number] DOB: 1941/02/03 DOA: 05/27/2019 PCP: Biagio Borg, MD   LOS: 3 days   Brief Narrative / Interim history: 78 year old male with history of hypertension, hyperlipidemia, COPD, PVD, Barrett's esophagus, GERD, who came to the hospital and was admitted on 05/27/2019 with complaints of nausea, vomiting diarrhea for the past couple of weeks.  Reportedly he was exposed to family members with his positive for Covid, and he ended up being positive himself.  He reports 3 watery bowel movements per day on average.  He also reports poor p.o. intake for the past 4 to 5 days.  He has had a headache, and fever up to 100 at home.  He denies any significant shortness of breath more so than his baseline.  He has a degree of dyspnea for which she is on Symbicort and albuterol at home.  He denies any cough or chest congestion.   Subjective / Interval events: -No shortness of breath this morning, does complain of shortness of breath if he does not get his inhalers.  Denies any chest pain.  Has been up and walking a little bit in the room without any complaints but was hypoxic and RN had to put back 2 L nasal cannula  Assessment & Plan: Principal Problem:   COVID-19 virus infection Active Problems:   Hyperlipidemia   Essential hypertension   GERD   Hyperkalemia   Hyponatremia   Transaminitis   Principal Problem Acute hypoxic respiratory failure due to COVID-19 infection -Very mildly hypoxic, 88% requiring 2 L to keep above 90%.  May very well be related to his COPD rather than COVID-19 given chest x-ray without acute findings but evidence of chronic changes related to his COPD/emphysema. -Continue oxygen as needed, wean off to room air as tolerated -Patient was started on Remdesivir on 9/19, continue for 5 days with last treatment date Wednesday, September 23 -He was also started on Decadron 6 mg daily, continue -Continue inhalers, vitamin C, zinc -Inflammatory  markers continue to improve today -give Lasix x1 today  -Patient really would like to go home tomorrow after last dose of Remdesivir.  Will obtain a chest x-ray first thing in the morning then will need to walk.  He wants to have his inhalers right prior to walking, will need to ambulate to determine whether he needs home O2.  Again, he has a degree of underlying emphysema / probable COPD, and may require oxygen at baseline and would not be surprised if he needs 2 L to go home with.  This was discussed with the patient and his daughter  [place]    05/28/19 0859 05/29/19 0200 05/30/19 0255 05/31/19 0205  DDIMER 0.68* 0.75* 0.84* 0.89*  FERRITIN 320 307 299 279  LDH 252*  --   --   --   CRP 19.9* 16.3* 8.1* 4.3*    Lab Results  Component Value Date   SARSCOV2NAA POSITIVE (A) 05/28/2019    Active Problems Nausea, vomiting, diarrhea, poor p.o. intake -Suspect secondary to COVID-19 infection, antiemetics as needed -No further issues, eating regularly  Slight creatinine elevation -Baseline creatinine around 1.0-1.1, he was 1.28 on admission likely due to poor p.o. intake -Creatinine now normalized after fluids.  Discontinued IV fluids on 9/21.  We will give a dose of Lasix today to assist with his hypoxia  Hyperkalemia -Resolved with fluids  Hyponatremia -Due to poor p.o. intake, resolved with fluids  Hypertension -Resume home medications today, blood pressure  stable  COPD/emphysema -Stable chest x-ray, no wheezing, continue inhalers  Elevated LFTs -Possibly related to viral infection, normalized  GERD, Barrett's esophagus -Continue PPI   Scheduled Meds: . clopidogrel  75 mg Oral Daily  . dexamethasone (DECADRON) injection  6 mg Intravenous Q24H  . enoxaparin (LOVENOX) injection  40 mg Subcutaneous Q24H  . furosemide  40 mg Intravenous Once  . gabapentin  100 mg Oral QHS  . hydrochlorothiazide  25 mg Oral Daily  . Ipratropium-Albuterol  1 puff  Inhalation Q6H  . lisinopril  20 mg Oral Daily  . mesalamine  1,500 mg Oral QHS  . mometasone-formoterol  2 puff Inhalation BID  . pantoprazole (PROTONIX) IV  40 mg Intravenous QHS  . sodium chloride flush  3 mL Intravenous Q12H  . cyanocobalamin  1,000 mcg Oral Daily  . vitamin C  500 mg Oral Daily  . zinc sulfate  220 mg Oral Daily   Continuous Infusions: . remdesivir 100 mg in NS 250 mL Stopped (05/30/19 1039)   PRN Meds:.acetaminophen, chlorpheniramine-HYDROcodone, guaiFENesin-dextromethorphan, hydrALAZINE, ondansetron **OR** ondansetron (ZOFRAN) IV, zolpidem  DVT prophylaxis: Lovenox Code Status: Full code Family Communication: d/w daughter Arrie Aran over the phone [phone]) Disposition Plan: home when finishes Remdesivir   Consultants:  None  Procedures:  None   Microbiology  None   Antimicrobials: None     Objective: Vitals:   05/30/19 0727 05/30/19 1619 05/30/19 2012 05/31/19 0417  BP: (!) 142/67 (!) 143/72  140/69  Pulse:    73  Resp: 20   14  Temp: 97.8 F (36.6 C) (!) 97.3 F (36.3 C) 98 F (36.7 C) 98.2 F (36.8 C)  TempSrc: Oral  Oral Oral  SpO2:    91%  Weight:      Height:        Intake/Output Summary (Last 24 hours) at 05/31/2019 8144 Last data filed at 05/31/2019 0417 Gross per 24 hour  Intake -  Output 5580 ml  Net -5580 ml   Filed Weights   05/28/19 1225  Weight: 103.4 kg    Examination:  Constitutional: No distress, in chair Eyes: No icterus seen ENMT: mmms Respiratory: Moves air well, slightly diminished at the bases but overall clear, no wheezing, no crackles Cardiovascular: Regular rate and rhythm, no murmurs appreciated, no peripheral edema Abdomen: Soft, nontender, nondistended, positive bowel sounds Musculoskeletal: no clubbing / cyanosis.  Skin: No rashes Neurologic: No focal deficits, equal strength Psychiatric: Alert and oriented x3   Data Reviewed: I have independently reviewed following labs and imaging studies   CBC: Recent Labs  Lab 05/27/19 1536 05/29/19 0200 05/30/19 0255 05/31/19 0205  WBC 7.5 6.1 9.9 9.7  NEUTROABS  --  5.4 9.1* 8.8*  HGB 15.0 13.3 14.6 14.1  HCT 44.1 40.3 44.1 41.4  MCV 83.1 84.3 83.5 82.8  PLT 183 187 227 818   Basic Metabolic Panel: Recent Labs  Lab 05/27/19 1536 05/28/19 0859 05/29/19 0200 05/30/19 0255 05/31/19 0205  NA 129*  --  136 137 136  K 5.4* 4.1 4.3 4.2 3.7  CL 98  --  108 108 105  CO2 18*  --  21* 20* 20*  GLUCOSE 113*  --  132* 117* 134*  BUN 22  --  21 27* 24*  CREATININE 1.28*  --  0.88 0.86 0.81  CALCIUM 8.0*  --  8.1* 8.5* 8.3*  MG  --   --  1.9 1.9 1.8   GFR: Estimated Creatinine Clearance: 89 mL/min (by C-G formula based on  SCr of 0.81 mg/dL). Liver Function Tests: Recent Labs  Lab 05/27/19 1536 05/29/19 0200 05/30/19 0255 05/31/19 0205  AST 49* 33 38 34  ALT 22 18 26 28   ALKPHOS 66 54 56 54  BILITOT 3.1* 0.7 0.6 0.8  PROT 6.1* 5.5* 6.0* 5.8*  ALBUMIN 3.0* 2.6* 2.9* 2.8*   Recent Labs  Lab 05/27/19 1536  LIPASE 24   No results for input(s): AMMONIA in the last 168 hours. Coagulation Profile: No results for input(s): INR, PROTIME in the last 168 hours. Cardiac Enzymes: No results for input(s): CKTOTAL, CKMB, CKMBINDEX, TROPONINI in the last 168 hours. BNP (last 3 results) Recent Labs    02/07/19 0954  PROBNP 57.0   HbA1C: No results for input(s): HGBA1C in the last 72 hours. CBG: No results for input(s): GLUCAP in the last 168 hours. Lipid Profile: No results for input(s): CHOL, HDL, LDLCALC, TRIG, CHOLHDL, LDLDIRECT in the last 72 hours. Thyroid Function Tests: No results for input(s): TSH, T4TOTAL, FREET4, T3FREE, THYROIDAB in the last 72 hours. Anemia Panel: Recent Labs    05/30/19 0255 05/31/19 0205  FERRITIN 299 279   Urine analysis:    Component Value Date/Time   COLORURINE YELLOW 02/07/2019 0954   APPEARANCEUR CLEAR 02/07/2019 0954   LABSPEC 1.020 02/07/2019 0954   PHURINE 6.0 02/07/2019  0954   GLUCOSEU NEGATIVE 02/07/2019 0954   HGBUR NEGATIVE 02/07/2019 0954   BILIRUBINUR NEGATIVE 02/07/2019 0954   KETONESUR NEGATIVE 02/07/2019 0954   PROTEINUR NEGATIVE 07/12/2018 1506   UROBILINOGEN 0.2 02/07/2019 0954   NITRITE NEGATIVE 02/07/2019 0954   LEUKOCYTESUR NEGATIVE 02/07/2019 0954   Sepsis Labs: Invalid input(s): PROCALCITONIN, LACTICIDVEN  Recent Results (from the past 240 hour(s))  SARS Coronavirus 2 [facility] order, Performed in [facility] hospital lab) Nasopharyngeal Nasopharyngeal Swab     Status: Abnormal   Collection Time: 05/28/19  4:38 AM   Specimen: Nasopharyngeal Swab  Result Value Ref Range Status   SARS Coronavirus 2 POSITIVE (A) NEGATIVE Final    Comment: RESULT CALLED TO, READ BACK BY AND VERIFIED WITH: RN M BROOKS @ [phone] 05/28/19 BY S GEZAHEGN (NOTE) If result is NEGATIVE SARS-CoV-2 target nucleic acids are NOT DETECTED. The SARS-CoV-2 RNA is generally detectable in upper and lower  respiratory specimens during the acute phase of infection. The lowest  concentration of SARS-CoV-2 viral copies this assay can detect is 250  copies / mL. A negative result does not preclude SARS-CoV-2 infection  and should not be used as the sole basis for treatment or other  patient management decisions.  A negative result may occur with  improper specimen collection / handling, submission of specimen other  than nasopharyngeal swab, presence of viral mutation(s) within the  areas targeted by this assay, and inadequate number of viral copies  (<250 copies / mL). A negative result must be combined with clinical  observations, patient history, and epidemiological information. If result is POSITIVE SARS-CoV-2 target nucleic acids are DETECTE D. The SARS-CoV-2 RNA is generally detectable in upper and lower  respiratory specimens during the acute phase of infection.  Positive  results are indicative of active infection with SARS-CoV-2.  Clinical  correlation with  patient history and other diagnostic information is  necessary to determine patient infection status.  Positive results do  not rule out bacterial infection or co-infection with other viruses. If result is PRESUMPTIVE POSTIVE SARS-CoV-2 nucleic acids MAY BE PRESENT.   A presumptive positive result was obtained on the submitted specimen  and confirmed on  repeat testing.  While 2019 novel coronavirus  (SARS-CoV-2) nucleic acids may be present in the submitted sample  additional confirmatory testing may be necessary for epidemiological  and / or clinical management purposes  to differentiate between  SARS-CoV-2 and other Sarbecovirus currently known to infect humans.  If clinically indicated additional testing with an alternate test  methodology (LAB745 3) is advised. The SARS-CoV-2 RNA is generally  detectable in upper and lower respiratory specimens during the acute  phase of infection. The expected result is Negative. Fact Sheet for Patients:  StrictlyIdeas.no Fact Sheet for Healthcare Providers: BankingDealers.co.za This test is not yet approved or cleared by the [place] FDA and has been authorized for detection and/or diagnosis of SARS-CoV-2 by FDA under an Emergency Use Authorization (EUA).  This EUA will remain in effect (meaning this test can be used) for the duration of the COVID-19 declaration under Section 564(b)(1) of the Act, 21 U.S.C. section 360bbb-3(b)(1), unless the authorization is terminated or revoked sooner. Performed at [facility], [place] [address], [place], [place] [address]       Radiology Studies: No results found.  Marzetta Board, MD, PhD [facility]  Contact via  [email or web address]  [place] P: [phone] F: [phone]

## 2019-06-01 ENCOUNTER — Inpatient Hospital Stay (HOSPITAL_COMMUNITY): Payer: Medicare Other

## 2019-06-01 LAB — CBC WITH DIFFERENTIAL/PLATELET
Abs Immature Granulocytes: 0.14 10*3/uL — ABNORMAL HIGH (ref 0.00–0.07)
Basophils Absolute: 0 10*3/uL (ref 0.0–0.1)
Basophils Relative: 0 %
Eosinophils Absolute: 0 10*3/uL (ref 0.0–0.5)
Eosinophils Relative: 0 %
HCT: 40.2 % (ref 39.0–52.0)
Hemoglobin: 13.8 g/dL (ref 13.0–17.0)
Immature Granulocytes: 2 %
Lymphocytes Relative: 4 %
Lymphs Abs: 0.3 10*3/uL — ABNORMAL LOW (ref 0.7–4.0)
MCH: 28.3 pg (ref 26.0–34.0)
MCHC: 34.3 g/dL (ref 30.0–36.0)
MCV: 82.4 fL (ref 80.0–100.0)
Monocytes Absolute: 0.7 10*3/uL (ref 0.1–1.0)
Monocytes Relative: 8 %
Neutro Abs: 8.1 10*3/uL — ABNORMAL HIGH (ref 1.7–7.7)
Neutrophils Relative %: 86 %
Platelets: 258 10*3/uL (ref 150–400)
RBC: 4.88 MIL/uL (ref 4.22–5.81)
RDW: 15.7 % — ABNORMAL HIGH (ref 11.5–15.5)
WBC: 9.3 10*3/uL (ref 4.0–10.5)
nRBC: 0 % (ref 0.0–0.2)

## 2019-06-01 LAB — C-REACTIVE PROTEIN: CRP: 14.5 mg/dL — ABNORMAL HIGH (ref <1.0)

## 2019-06-01 LAB — COMPREHENSIVE METABOLIC PANEL
ALT: 30 U/L (ref 0–44)
AST: 31 U/L (ref 15–41)
Albumin: 2.7 g/dL — ABNORMAL LOW (ref 3.5–5.0)
Alkaline Phosphatase: 60 U/L (ref 38–126)
Anion gap: 10 (ref 5–15)
BUN: 30 mg/dL — ABNORMAL HIGH (ref 8–23)
CO2: 21 mmol/L — ABNORMAL LOW (ref 22–32)
Calcium: 8.1 mg/dL — ABNORMAL LOW (ref 8.9–10.3)
Chloride: 105 mmol/L (ref 98–111)
Creatinine, Ser: 0.92 mg/dL (ref 0.61–1.24)
GFR calc Af Amer: >60 mL/min (ref >60)
GFR calc non Af Amer: >60 mL/min (ref >60)
Glucose, Bld: 106 mg/dL — ABNORMAL HIGH (ref 70–99)
Potassium: 3.7 mmol/L (ref 3.5–5.1)
Sodium: 136 mmol/L (ref 135–145)
Total Bilirubin: 0.9 mg/dL (ref 0.3–1.2)
Total Protein: 5.8 g/dL — ABNORMAL LOW (ref 6.5–8.1)

## 2019-06-01 LAB — D-DIMER, QUANTITATIVE: D-Dimer, Quant: 0.81 ug/mL-FEU — ABNORMAL HIGH (ref 0.00–0.50)

## 2019-06-01 LAB — FERRITIN: Ferritin: 276 ng/mL (ref 24–336)

## 2019-06-01 LAB — MAGNESIUM: Magnesium: 1.9 mg/dL (ref 1.7–2.4)

## 2019-06-01 MED ORDER — SALINE SPRAY 0.65 % NA SOLN
1.0000 | NASAL | Status: DC | PRN
  Administered 2019-06-01: 22:00:00 1 via NASAL
  Filled 2019-06-01: qty 44

## 2019-06-01 NOTE — Progress Notes (Signed)
PROGRESS NOTE    [name] [name]  SEG:[id number] DOB: [birth year] DOA: 05/27/2019 PCP: Biagio Borg, MD      Brief Narrative:  [name] [name] is a 78 y.o. M with COPD not on home O2, PVD s/p right fem endarterectomy and stent, Ulcerative colitis, and HTN who presented with N/V/D for several weeks.  In the ER, he was SOB and CXR without opacities.      Assessment & Plan:  [id number] with acute hypoxic respiratory failure Nausea, vomiting and diarrhea due to COVID-19 In the setting of the ongoing 2020 COVID-19 pandemic.  S/p remdesivir x5 days  -Continue dexamethasone, day 5 -VTE PPx with Lovenox -Continue zinc and vitamin C -Flutter valve, turn, cough, incentive spirometry q2hrs while awake    COPD No evidence of acute bronchospasm -Continue Symbicort  Hyperkalemia Resolved with fluids  Hyponatremia Resolved with fluids  Ulcerative colitis No active disease -Continue mesalamine  Hypertension Peripheral vascular disease secondary prevention BP controlled -Continue Plavix -Continue HCTZ, lisinopril  Transaminitis Mild, LFTs stable  Other medications -Continue gabapentin -Continue PPI -Hold allopurinol         MDM and disposition: The below labs and imaging reports were reviewed and summarized above.  Medication management as above.  The patient was admitted with COVID-19.  He remains on O2, desaturates to mid 80s on room air.         DVT prophylaxis: Lovenox Code Status: FULL Family Communication: Will call wife by phone     Procedures:   CXR 9/19 -- bilateral pneumonia  CXR 9/23 -- similar bilateral pneumonia     Subjective: Feeling no SOB with exertion.  Some cough, non-productive.  No fever.  No vomiting.  Appetite good.   Objective: Vitals:   06/01/19 0525 06/01/19 0800 06/01/19 0822 06/01/19 1202  BP: (!) 160/62  (!) 119/54 134/66  Pulse: 87 77 74 92  Resp: (!) 25 19 19    Temp: 97.8 F (36.6 C) 97.7 F (36.5 C)   98.1 F (36.7 C)  TempSrc: Oral Oral  Oral  SpO2: 95% (!) 85% (!) 87% (!) 89%  Weight:      Height:        Intake/Output Summary (Last 24 hours) at 06/01/2019 1243 Last data filed at 06/01/2019 0836 Gross per 24 hour  Intake 990 ml  Output 1250 ml  Net -260 ml   Filed Weights   05/28/19 1225  Weight: 103.4 kg    Examination: General appearance:  adult male, alert and in no acute distress.  Sitting in recliner HEENT: Anicteric, conjunctiva pink, lids and lashes normal. No nasal deformity, discharge, epistaxis.  Lips moist, dentition normal, OP tacky dry no oral lesions, hearing normal.   Skin: Warm and dry.  no jaundice.  No suspicious rashes or lesions. Cardiac: RRR, nl S1-S2, no murmurs appreciated.  Capillary refill is brisk.  JVP normal.  No LE edema.  Radial pulses 2+ and symmetric. Respiratory: Tachypneic at rest.  No wheezing or rales. Abdomen: Abdomen soft.  No TTP or guarding. No ascites, distension, hepatosplenomegaly.   MSK: No deformities or effusions. Neuro: Awake and alert.  EOMI, moves all extremities. Speech fluent.    Psych: Sensorium intact and responding to questions, attention normal. Affect normal.  Judgment and insight appear normal.      Data Reviewed: I have personally reviewed following labs and imaging studies:  CBC: Recent Labs  Lab 05/27/19 1536 05/29/19 0200 05/30/19 0255 05/31/19 0205 06/01/19 0220  WBC 7.5 6.1 9.9  9.7 9.3  NEUTROABS  --  5.4 9.1* 8.8* 8.1*  HGB 15.0 13.3 14.6 14.1 13.8  HCT 44.1 40.3 44.1 41.4 40.2  MCV 83.1 84.3 83.5 82.8 82.4  PLT 183 187 227 236 774   Basic Metabolic Panel: Recent Labs  Lab 05/27/19 1536 05/28/19 0859 05/29/19 0200 05/30/19 0255 05/31/19 0205 06/01/19 0220  NA 129*  --  136 137 136 136  K 5.4* 4.1 4.3 4.2 3.7 3.7  CL 98  --  108 108 105 105  CO2 18*  --  21* 20* 20* 21*  GLUCOSE 113*  --  132* 117* 134* 106*  BUN 22  --  21 27* 24* 30*  CREATININE 1.28*  --  0.88 0.86 0.81 0.92   CALCIUM 8.0*  --  8.1* 8.5* 8.3* 8.1*  MG  --   --  1.9 1.9 1.8 1.9   GFR: Estimated Creatinine Clearance: 78.4 mL/min (by C-G formula based on SCr of 0.92 mg/dL). Liver Function Tests: Recent Labs  Lab 05/27/19 1536 05/29/19 0200 05/30/19 0255 05/31/19 0205 06/01/19 0220  AST 49* 33 38 34 31  ALT 22 18 26 28 30   ALKPHOS 66 54 56 54 60  BILITOT 3.1* 0.7 0.6 0.8 0.9  PROT 6.1* 5.5* 6.0* 5.8* 5.8*  ALBUMIN 3.0* 2.6* 2.9* 2.8* 2.7*   Recent Labs  Lab 05/27/19 1536  LIPASE 24   No results for input(s): AMMONIA in the last 168 hours. Coagulation Profile: No results for input(s): INR, PROTIME in the last 168 hours. Cardiac Enzymes: No results for input(s): CKTOTAL, CKMB, CKMBINDEX, TROPONINI in the last 168 hours. BNP (last 3 results) Recent Labs    02/07/19 0954  PROBNP 57.0   HbA1C: No results for input(s): HGBA1C in the last 72 hours. CBG: No results for input(s): GLUCAP in the last 168 hours. Lipid Profile: No results for input(s): CHOL, HDL, LDLCALC, TRIG, CHOLHDL, LDLDIRECT in the last 72 hours. Thyroid Function Tests: No results for input(s): TSH, T4TOTAL, FREET4, T3FREE, THYROIDAB in the last 72 hours. Anemia Panel: Recent Labs    05/31/19 0205 06/01/19 0220  FERRITIN 279 276   Urine analysis:    Component Value Date/Time   COLORURINE YELLOW 02/07/2019 [place] 02/07/2019 0954   LABSPEC 1.020 02/07/2019 0954   PHURINE 6.0 02/07/2019 0954   GLUCOSEU NEGATIVE 02/07/2019 0954   HGBUR NEGATIVE 02/07/2019 0954   BILIRUBINUR NEGATIVE 02/07/2019 0954   KETONESUR NEGATIVE 02/07/2019 0954   PROTEINUR NEGATIVE 07/12/2018 1506   UROBILINOGEN 0.2 02/07/2019 0954   NITRITE NEGATIVE 02/07/2019 0954   LEUKOCYTESUR NEGATIVE 02/07/2019 0954   Sepsis Labs: @LABRCNTIP (procalcitonin:4,lacticacidven:4)  ) Recent Results (from the past 240 hour(s))  SARS Coronavirus 2 [facility] order, Performed in [facility] hospital lab) Nasopharyngeal  Nasopharyngeal Swab     Status: Abnormal   Collection Time: 05/28/19  4:38 AM   Specimen: Nasopharyngeal Swab  Result Value Ref Range Status   SARS Coronavirus 2 POSITIVE (A) NEGATIVE Final    Comment: RESULT CALLED TO, READ BACK BY AND VERIFIED WITH: RN M BROOKS @ [phone] 05/28/19 BY S GEZAHEGN (NOTE) If result is NEGATIVE SARS-CoV-2 target nucleic acids are NOT DETECTED. The SARS-CoV-2 RNA is generally detectable in upper and lower  respiratory specimens during the acute phase of infection. The lowest  concentration of SARS-CoV-2 viral copies this assay can detect is 250  copies / mL. A negative result does not preclude SARS-CoV-2 infection  and should not be used as the sole basis  for treatment or other  patient management decisions.  A negative result may occur with  improper specimen collection / handling, submission of specimen other  than nasopharyngeal swab, presence of viral mutation(s) within the  areas targeted by this assay, and inadequate number of viral copies  (<250 copies / mL). A negative result must be combined with clinical  observations, patient history, and epidemiological information. If result is POSITIVE SARS-CoV-2 target nucleic acids are DETECTE D. The SARS-CoV-2 RNA is generally detectable in upper and lower  respiratory specimens during the acute phase of infection.  Positive  results are indicative of active infection with SARS-CoV-2.  Clinical  correlation with patient history and other diagnostic information is  necessary to determine patient infection status.  Positive results do  not rule out bacterial infection or co-infection with other viruses. If result is PRESUMPTIVE POSTIVE SARS-CoV-2 nucleic acids MAY BE PRESENT.   A presumptive positive result was obtained on the submitted specimen  and confirmed on repeat testing.  While 2019 novel coronavirus  (SARS-CoV-2) nucleic acids may be present in the submitted sample  additional confirmatory testing  may be necessary for epidemiological  and / or clinical management purposes  to differentiate between  SARS-CoV-2 and other Sarbecovirus currently known to infect humans.  If clinically indicated additional testing with an alternate test  methodology (LAB745 3) is advised. The SARS-CoV-2 RNA is generally  detectable in upper and lower respiratory specimens during the acute  phase of infection. The expected result is Negative. Fact Sheet for Patients:  StrictlyIdeas.no Fact Sheet for Healthcare Providers: BankingDealers.co.za This test is not yet approved or cleared by the [place] FDA and has been authorized for detection and/or diagnosis of SARS-CoV-2 by FDA under an Emergency Use Authorization (EUA).  This EUA will remain in effect (meaning this test can be used) for the duration of the COVID-19 declaration under Section 564(b)(1) of the Act, 21 U.S.C. section 360bbb-3(b)(1), unless the authorization is terminated or revoked sooner. Performed at [facility], [place] [address], [place], [place] [address]          Radiology Studies: Dg Chest Port 1 View  Result Date: 06/01/2019 CLINICAL DATA:  COVID-19 EXAM: PORTABLE CHEST 1 VIEW COMPARISON:  05/28/2019 FINDINGS: No significant interval change in diffuse bilateral interstitial and heterogeneous airspace opacity, most conspicuous in the right midlung and left lung base. No new or focal airspace opacity. The heart and mediastinum are unremarkable. IMPRESSION: No significant interval change in diffuse bilateral interstitial and heterogeneous airspace opacity, most conspicuous in the right midlung and left lung base. No new or focal airspace opacity. Findings remain consistent with COVID-19 airspace disease. Electronically Signed   By: Eddie Candle M.D.   On: 06/01/2019 08:57        Scheduled Meds: . clopidogrel  75 mg Oral Daily  . dexamethasone (DECADRON) injection  6 mg  Intravenous Q24H  . enoxaparin (LOVENOX) injection  40 mg Subcutaneous Q24H  . gabapentin  100 mg Oral QHS  . hydrochlorothiazide  25 mg Oral Daily  . Ipratropium-Albuterol  1 puff Inhalation Q6H  . lisinopril  20 mg Oral Daily  . mesalamine  1,500 mg Oral QHS  . mometasone-formoterol  2 puff Inhalation BID  . pantoprazole  40 mg Oral Daily  . sodium chloride flush  3 mL Intravenous Q12H  . cyanocobalamin  1,000 mcg Oral Daily  . vitamin C  500 mg Oral Daily  . zinc sulfate  220 mg Oral Daily  Continuous Infusions:   LOS: 4 days    Time spent: 25 minutes      Edwin Dada, MD [facility] 06/01/2019, 12:43 PM     Please page through [place]:  [email or web address] Contact charge nurse for password If 7PM-7AM, please contact night-coverage

## 2019-06-01 NOTE — Evaluation (Signed)
Physical Therapy Evaluation Patient Details Name: [name] [name] MRN: [id number] DOB: May 21, 1941 Today's Date: 06/01/2019   History of Present Illness   78 y.o. M with COPD not on home O2, PVD s/p right fem endarterectomy and stent, Ulcerative colitis, Barrett's esophagus, GERD,HLD, HyerK, HypoNa and HTN who presented (9/18) with N/V/D for several weeks.  In the ER, he was SOB and CXR without opacities.  Clinical Impression   Pt admitted with above diagnosis. Pt currently with functional limitations due to the deficits listed below (see PT Problem List). Pt will benefit from skilled PT to increase their independence and safety with mobility to allow discharge home as desired. Pt does well with mobility and is functionally at mod I with all mobility but, is very unaware of safety precautions and also when he desats. He was able to remain in low 90s on 3L/min at rest but with ambulation desat to low 80s and took increased time (>17mns) and increase to 4L/min to recover to high 80s.      Follow Up Recommendations No PT follow up    Equipment Recommendations  Other (comment)    Recommendations for Other Services       Precautions / Restrictions Precautions Precautions: Fall;Other (comment) Precaution Comments: Desats with activity Restrictions Weight Bearing Restrictions: No      Mobility  Bed Mobility Overal bed mobility: Modified Independent                Transfers Overall transfer level: Modified independent Equipment used: None             General transfer comment: takes increased time but able to complete without extrenal assist  Ambulation/Gait Ambulation/Gait assistance: Supervision Gait Distance (Feet): 172 Feet Assistive device: None Gait Pattern/deviations: Step-through pattern Gait velocity: slightly decreased      Stairs            Wheelchair Mobility    Modified Rankin (Stroke Patients Only)       Balance Overall balance  assessment: Modified Independent                                           Pertinent Vitals/Pain Pain Assessment: No/denies pain    Home Living Family/patient expects to be discharged to:: Private residence Living Arrangements: Alone Available Help at Discharge: Family;Available PRN/intermittently Type of Home: House Home Access: Stairs to enter Entrance Stairs-Rails: Can reach both Entrance Stairs-Number of Steps: 2 Home Layout: One level Home Equipment: Walker - 2 wheels;Shower seat - built in      Prior Function Level of Independence: Independent         Comments: 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  Dominant Hand: Right    Extremity/Trunk Assessment   Upper Extremity Assessment Upper Extremity Assessment: Defer to OT evaluation    Lower Extremity Assessment Lower Extremity Assessment: Overall WFL for tasks assessed    Cervical / Trunk Assessment Cervical / Trunk Assessment: Normal  Communication   Communication: No difficulties  Cognition Arousal/Alertness: Awake/alert Behavior During Therapy: WFL for tasks assessed/performed Overall Cognitive Status: Within Functional Limits for tasks assessed                                        General Comments General comments (skin integrity,  edema, etc.): Pt does well with mobility but tends to desat and not be aware of this. He is generally very independent and hence highly motivated to improve and go home.    Exercises     Assessment/Plan    PT Assessment Patient needs continued PT services  PT Problem List Decreased activity tolerance;Decreased safety awareness       PT Treatment Interventions Gait training;Functional mobility training;Therapeutic activities    PT Goals (Current goals can be found in the Care Plan section)  Acute Rehab PT Goals Patient Stated Goal: go home and be able to work around house PT Goal Formulation: With patient Time For Goal  Achievement: 06/15/19 Potential to Achieve Goals: Good    Frequency Min 3X/week   Barriers to discharge Other (comment)(02 saturation w/ activity)      Co-evaluation               AM-PAC PT "6 Clicks" Mobility  Outcome Measure Help needed turning from your back to your side while in a flat bed without using bedrails?: None Help needed moving from lying on your back to sitting on the side of a flat bed without using bedrails?: None Help needed moving to and from a bed to a chair (including a wheelchair)?: None Help needed standing up from a chair using your arms (e.g., wheelchair or bedside chair)?: None Help needed to walk in hospital room?: A Little Help needed climbing 3-5 steps with a railing? : A Little 6 Click Score: 22    End of Session Equipment Utilized During Treatment: Oxygen Activity Tolerance: Treatment limited secondary to medical complications (Comment) Patient left: in chair;with call bell/phone within reach Nurse Communication: Mobility status PT Visit Diagnosis: Other abnormalities of gait and mobility (R26.89)    Time: 9373-4287 PT Time Calculation (min) (ACUTE ONLY): 36 min   Charges:   PT Evaluation $PT Eval Moderate Complexity: 1 Mod PT Treatments $Gait Training: 23-37 mins        Horald Chestnut, PT   Delford Field 06/01/2019, 4:41 PM

## 2019-06-01 NOTE — Progress Notes (Signed)
Updated family on pts condition, will keep monitoring pt

## 2019-06-02 LAB — COMPREHENSIVE METABOLIC PANEL
ALT: 29 U/L (ref 0–44)
AST: 27 U/L (ref 15–41)
Albumin: 2.6 g/dL — ABNORMAL LOW (ref 3.5–5.0)
Alkaline Phosphatase: 60 U/L (ref 38–126)
Anion gap: 11 (ref 5–15)
BUN: 31 mg/dL — ABNORMAL HIGH (ref 8–23)
CO2: 20 mmol/L — ABNORMAL LOW (ref 22–32)
Calcium: 8.1 mg/dL — ABNORMAL LOW (ref 8.9–10.3)
Chloride: 103 mmol/L (ref 98–111)
Creatinine, Ser: 0.93 mg/dL (ref 0.61–1.24)
GFR calc Af Amer: >60 mL/min (ref >60)
GFR calc non Af Amer: >60 mL/min (ref >60)
Glucose, Bld: 119 mg/dL — ABNORMAL HIGH (ref 70–99)
Potassium: 3.9 mmol/L (ref 3.5–5.1)
Sodium: 134 mmol/L — ABNORMAL LOW (ref 135–145)
Total Bilirubin: 0.9 mg/dL (ref 0.3–1.2)
Total Protein: 5.6 g/dL — ABNORMAL LOW (ref 6.5–8.1)

## 2019-06-02 LAB — CBC WITH DIFFERENTIAL/PLATELET
Abs Immature Granulocytes: 0.18 10*3/uL — ABNORMAL HIGH (ref 0.00–0.07)
Basophils Absolute: 0 10*3/uL (ref 0.0–0.1)
Basophils Relative: 0 %
Eosinophils Absolute: 0 10*3/uL (ref 0.0–0.5)
Eosinophils Relative: 0 %
HCT: 38.5 % — ABNORMAL LOW (ref 39.0–52.0)
Hemoglobin: 13 g/dL (ref 13.0–17.0)
Immature Granulocytes: 2 %
Lymphocytes Relative: 3 %
Lymphs Abs: 0.3 10*3/uL — ABNORMAL LOW (ref 0.7–4.0)
MCH: 28 pg (ref 26.0–34.0)
MCHC: 33.8 g/dL (ref 30.0–36.0)
MCV: 83 fL (ref 80.0–100.0)
Monocytes Absolute: 0.6 10*3/uL (ref 0.1–1.0)
Monocytes Relative: 7 %
Neutro Abs: 8 10*3/uL — ABNORMAL HIGH (ref 1.7–7.7)
Neutrophils Relative %: 88 %
Platelets: 256 10*3/uL (ref 150–400)
RBC: 4.64 MIL/uL (ref 4.22–5.81)
RDW: 15.9 % — ABNORMAL HIGH (ref 11.5–15.5)
WBC: 9.1 10*3/uL (ref 4.0–10.5)
nRBC: 0 % (ref 0.0–0.2)

## 2019-06-02 LAB — FERRITIN: Ferritin: 318 ng/mL (ref 24–336)

## 2019-06-02 LAB — D-DIMER, QUANTITATIVE: D-Dimer, Quant: 0.97 ug/mL-FEU — ABNORMAL HIGH (ref 0.00–0.50)

## 2019-06-02 LAB — C-REACTIVE PROTEIN: CRP: 16.2 mg/dL — ABNORMAL HIGH (ref <1.0)

## 2019-06-02 LAB — MAGNESIUM: Magnesium: 2 mg/dL (ref 1.7–2.4)

## 2019-06-02 NOTE — Progress Notes (Signed)
PROGRESS NOTE    [name] [name]  KGM:[id number] DOB: [birth year] DOA: 05/27/2019 PCP: Biagio Borg, MD      Brief Narrative:  Mr. Recore is a 78 y.o. M with COPD not on home O2, PVD s/p right fem endarterectomy and stent, Ulcerative colitis, and HTN who presented with N/V/D for several weeks.  In the ER, he was SOB and CXR without opacities.  Assessment & Plan:  [id number] with acute hypoxic respiratory failure, POA Nausea, vomiting and diarrhea due to COVID-19 - S/p remdesivir x5 days - CXR - bibasilar atelectasis - personally reviewed - Continue dexamethasone, day 5 - VTE PPx with Lovenox - Continue zinc and vitamin C - Flutter valve, continue to prone, incentive spirometry q2hrs while awake Recent Labs    05/31/19 0205 06/01/19 0220 06/02/19 0145  DDIMER 0.89* 0.81* 0.97*  FERRITIN 279 276 318  CRP 4.3* 14.5* 16.2*   COPD, not in acute exacerbation - No evidence of acute bronchospasm - Continue Symbicort  Hyperkalemia, resolved - Resolved with fluids  Hyponatremia, likely hypovolemic in nature - Resolved with fluids  Ulcerative colitis, history of, stable - No active disease - Continue mesalamine  Hypertension, essential Peripheral vascular disease secondary prevention - BP controlled - Continue Plavix - Continue HCTZ, lisinopril  Elevated liver enzymes - Mild, LFTs stable  Other medications - Continue gabapentin - Continue PPI - Hold allopurinol  MDM and disposition: The below labs and imaging reports were reviewed and summarized above.  Medication management as above. The patient was admitted with COVID-19.  He remains on O2, desaturates to mid 80s on room air.    DVT prophylaxis: Lovenox Code Status: FULL Family Communication: Daughter called - voicemail left - no answer   Procedures:   CXR 9/19 -- bilateral pneumonia  CXR 9/23 -- similar bilateral pneumonia  Subjective: No acute issues or events overnight, continues to complain of  shortness of breath, worse with exertion, denies chest pain, nausea, vomiting, diarrhea, constipation, headache, fevers, chills.  Objective: Vitals:   06/01/19 1202 06/01/19 2010 06/02/19 0400 06/02/19 0819  BP: 134/66 (!) 141/64 134/69 131/62  Pulse: 92 81    Resp:  18    Temp: 98.1 F (36.7 C) 97.8 F (36.6 C) 97.7 F (36.5 C) 97.9 F (36.6 C)  TempSrc: Oral Oral Oral   SpO2: (!) 89% 91%    Weight:      Height:        Intake/Output Summary (Last 24 hours) at 06/02/2019 0834 Last data filed at 06/02/2019 0700 Gross per 24 hour  Intake 730 ml  Output 1275 ml  Net -545 ml   Filed Weights   05/28/19 1225  Weight: 103.4 kg    Examination: General appearance:  adult male, alert and in no acute distress.  Sitting in recliner HEENT: Anicteric, conjunctiva pink, lids and lashes normal. No nasal deformity, discharge, epistaxis.  Lips moist, dentition normal, OP tacky dry no oral lesions, hearing normal.   Skin: Warm and dry.  no jaundice.  No suspicious rashes or lesions. Cardiac: RRR, nl S1-S2, no murmurs appreciated.  Capillary refill is brisk.  JVP normal.  No LE edema.  Radial pulses 2+ and symmetric. Respiratory: Tachypneic at rest.  No wheezing or rales. Abdomen: Abdomen soft.  No TTP or guarding. No ascites, distension, hepatosplenomegaly.   MSK: No deformities or effusions. Neuro: Awake and alert.  EOMI, moves all extremities. Speech fluent.    Psych: Sensorium intact and responding to questions, attention normal. Affect  normal.  Judgment and insight appear normal.  Data Reviewed: I have personally reviewed following labs and imaging studies:  CBC: Recent Labs  Lab 05/29/19 0200 05/30/19 0255 05/31/19 0205 06/01/19 0220 06/02/19 0145  WBC 6.1 9.9 9.7 9.3 9.1  NEUTROABS 5.4 9.1* 8.8* 8.1* 8.0*  HGB 13.3 14.6 14.1 13.8 13.0  HCT 40.3 44.1 41.4 40.2 38.5*  MCV 84.3 83.5 82.8 82.4 83.0  PLT 187 227 236 258 384   Basic Metabolic Panel: Recent Labs  Lab 05/29/19  0200 05/30/19 0255 05/31/19 0205 06/01/19 0220 06/02/19 0145  NA 136 137 136 136 134*  K 4.3 4.2 3.7 3.7 3.9  CL 108 108 105 105 103  CO2 21* 20* 20* 21* 20*  GLUCOSE 132* 117* 134* 106* 119*  BUN 21 27* 24* 30* 31*  CREATININE 0.88 0.86 0.81 0.92 0.93  CALCIUM 8.1* 8.5* 8.3* 8.1* 8.1*  MG 1.9 1.9 1.8 1.9 2.0   GFR: Estimated Creatinine Clearance: 77.5 mL/min (by C-G formula based on SCr of 0.93 mg/dL). Liver Function Tests: Recent Labs  Lab 05/29/19 0200 05/30/19 0255 05/31/19 0205 06/01/19 0220 06/02/19 0145  AST 33 38 34 31 27  ALT 18 26 28 30 29   ALKPHOS 54 56 54 60 60  BILITOT 0.7 0.6 0.8 0.9 0.9  PROT 5.5* 6.0* 5.8* 5.8* 5.6*  ALBUMIN 2.6* 2.9* 2.8* 2.7* 2.6*   Recent Labs  Lab 05/27/19 1536  LIPASE 24   No results for input(s): AMMONIA in the last 168 hours. Coagulation Profile: No results for input(s): INR, PROTIME in the last 168 hours. Cardiac Enzymes: No results for input(s): CKTOTAL, CKMB, CKMBINDEX, TROPONINI in the last 168 hours. BNP (last 3 results) Recent Labs    02/07/19 0954  PROBNP 57.0    Recent Results (from the past 240 hour(s))  SARS Coronavirus 2 [facility] order, Performed in [facility] hospital lab) Nasopharyngeal Nasopharyngeal Swab     Status: Abnormal   Collection Time: 05/28/19  4:38 AM   Specimen: Nasopharyngeal Swab  Result Value Ref Range Status   SARS Coronavirus 2 POSITIVE (A) NEGATIVE Final    Comment: RESULT CALLED TO, READ BACK BY AND VERIFIED WITH: RN M BROOKS @ [phone] 05/28/19 BY S GEZAHEGN (NOTE) If result is NEGATIVE SARS-CoV-2 target nucleic acids are NOT DETECTED. The SARS-CoV-2 RNA is generally detectable in upper and lower  respiratory specimens during the acute phase of infection. The lowest  concentration of SARS-CoV-2 viral copies this assay can detect is 250  copies / mL. A negative result does not preclude SARS-CoV-2 infection  and should not be used as the sole basis for treatment or other  patient  management decisions.  A negative result may occur with  improper specimen collection / handling, submission of specimen other  than nasopharyngeal swab, presence of viral mutation(s) within the  areas targeted by this assay, and inadequate number of viral copies  (<250 copies / mL). A negative result must be combined with clinical  observations, patient history, and epidemiological information. If result is POSITIVE SARS-CoV-2 target nucleic acids are DETECTE D. The SARS-CoV-2 RNA is generally detectable in upper and lower  respiratory specimens during the acute phase of infection.  Positive  results are indicative of active infection with SARS-CoV-2.  Clinical  correlation with patient history and other diagnostic information is  necessary to determine patient infection status.  Positive results do  not rule out bacterial infection or co-infection with other viruses. If result is PRESUMPTIVE POSTIVE SARS-CoV-2 nucleic acids  MAY BE PRESENT.   A presumptive positive result was obtained on the submitted specimen  and confirmed on repeat testing.  While 2019 novel coronavirus  (SARS-CoV-2) nucleic acids may be present in the submitted sample  additional confirmatory testing may be necessary for epidemiological  and / or clinical management purposes  to differentiate between  SARS-CoV-2 and other Sarbecovirus currently known to infect humans.  If clinically indicated additional testing with an alternate test  methodology (LAB745 3) is advised. The SARS-CoV-2 RNA is generally  detectable in upper and lower respiratory specimens during the acute  phase of infection. The expected result is Negative. Fact Sheet for Patients:  StrictlyIdeas.no Fact Sheet for Healthcare Providers: BankingDealers.co.za This test is not yet approved or cleared by the [place] FDA and has been authorized for detection and/or diagnosis of SARS-CoV-2 by FDA under  an Emergency Use Authorization (EUA).  This EUA will remain in effect (meaning this test can be used) for the duration of the COVID-19 declaration under Section 564(b)(1) of the Act, 21 U.S.C. section 360bbb-3(b)(1), unless the authorization is terminated or revoked sooner. Performed at [facility], [place] [address], [place], [place] [address]     Radiology Studies: Dg Chest Port 1 View  Result Date: 06/01/2019 CLINICAL DATA:  COVID-19 EXAM: PORTABLE CHEST 1 VIEW COMPARISON:  05/28/2019 FINDINGS: No significant interval change in diffuse bilateral interstitial and heterogeneous airspace opacity, most conspicuous in the right midlung and left lung base. No new or focal airspace opacity. The heart and mediastinum are unremarkable. IMPRESSION: No significant interval change in diffuse bilateral interstitial and heterogeneous airspace opacity, most conspicuous in the right midlung and left lung base. No new or focal airspace opacity. Findings remain consistent with COVID-19 airspace disease. Electronically Signed   By: Eddie Candle M.D.   On: 06/01/2019 08:57   Scheduled Meds: . clopidogrel  75 mg Oral Daily  . dexamethasone (DECADRON) injection  6 mg Intravenous Q24H  . enoxaparin (LOVENOX) injection  40 mg Subcutaneous Q24H  . gabapentin  100 mg Oral QHS  . hydrochlorothiazide  25 mg Oral Daily  . Ipratropium-Albuterol  1 puff Inhalation Q6H  . lisinopril  20 mg Oral Daily  . mesalamine  1,500 mg Oral QHS  . mometasone-formoterol  2 puff Inhalation BID  . pantoprazole  40 mg Oral Daily  . sodium chloride flush  3 mL Intravenous Q12H  . cyanocobalamin  1,000 mcg Oral Daily  . vitamin C  500 mg Oral Daily  . zinc sulfate  220 mg Oral Daily   Continuous Infusions:   LOS: 5 days   Time spent: 25 minutes   Little Ishikawa, DO [facility] 06/02/2019, 8:34 AM    Please page through [place]:  [email or web address] Contact charge nurse for password If 7PM-7AM, please contact  night-coverage

## 2019-06-02 NOTE — Plan of Care (Signed)
  Problem: Education: Goal: Knowledge of risk factors and measures for prevention of condition will improve Outcome: Progressing   Problem: Coping: Goal: Psychosocial and spiritual needs will be supported Outcome: Progressing   Problem: Respiratory: Goal: Will maintain a patent airway Outcome: Progressing Goal: Complications related to the disease process, condition or treatment will be avoided or minimized Outcome: Progressing   Problem: Education: Goal: Knowledge of General Education information will improve Description: Including pain rating scale, medication(s)/side effects and non-pharmacologic comfort measures Outcome: Progressing   Problem: Health Behavior/Discharge Planning: Goal: Ability to manage health-related needs will improve Outcome: Progressing   Problem: Clinical Measurements: Goal: Ability to maintain clinical measurements within normal limits will improve Outcome: Progressing Goal: Will remain free from infection Outcome: Progressing Goal: Diagnostic test results will improve Outcome: Progressing Goal: Respiratory complications will improve Outcome: Progressing Goal: Cardiovascular complication will be avoided Outcome: Progressing   Problem: Activity: Goal: Risk for activity intolerance will decrease Outcome: Progressing   Problem: Nutrition: Goal: Adequate nutrition will be maintained Outcome: Progressing   Problem: Coping: Goal: Level of anxiety will decrease Outcome: Progressing   Problem: Elimination: Goal: Will not experience complications related to bowel motility Outcome: Progressing Goal: Will not experience complications related to urinary retention Outcome: Progressing   Problem: Pain Managment: Goal: General experience of comfort will improve Outcome: Progressing   Problem: Safety: Goal: Ability to remain free from injury will improve Outcome: Progressing   Problem: Skin Integrity: Goal: Risk for impaired skin integrity will  decrease Outcome: Progressing   Problem: Education: Goal: Knowledge of risk factors and measures for prevention of condition will improve Outcome: Progressing   Problem: Coping: Goal: Psychosocial and spiritual needs will be supported Outcome: Progressing   Problem: Respiratory: Goal: Will maintain a patent airway Outcome: Progressing Goal: Complications related to the disease process, condition or treatment will be avoided or minimized Outcome: Progressing   Problem: Education: Goal: Knowledge of General Education information will improve Description: Including pain rating scale, medication(s)/side effects and non-pharmacologic comfort measures Outcome: Progressing   Problem: Health Behavior/Discharge Planning: Goal: Ability to manage health-related needs will improve Outcome: Progressing   Problem: Clinical Measurements: Goal: Ability to maintain clinical measurements within normal limits will improve Outcome: Progressing Goal: Will remain free from infection Outcome: Progressing Goal: Diagnostic test results will improve Outcome: Progressing Goal: Respiratory complications will improve Outcome: Progressing Goal: Cardiovascular complication will be avoided Outcome: Progressing   Problem: Activity: Goal: Risk for activity intolerance will decrease Outcome: Progressing   Problem: Nutrition: Goal: Adequate nutrition will be maintained Outcome: Progressing   Problem: Coping: Goal: Level of anxiety will decrease Outcome: Progressing   Problem: Elimination: Goal: Will not experience complications related to bowel motility Outcome: Progressing Goal: Will not experience complications related to urinary retention Outcome: Progressing   Problem: Pain Managment: Goal: General experience of comfort will improve Outcome: Progressing   Problem: Safety: Goal: Ability to remain free from injury will improve Outcome: Progressing   Problem: Skin Integrity: Goal: Risk  for impaired skin integrity will decrease Outcome: Progressing

## 2019-06-02 NOTE — Progress Notes (Signed)
Occupational Therapy Evaluation Patient Details Name: [name] [name] MRN: [id number] DOB: 18-Aug-1941 Today's Date: 06/02/2019    History of Present Illness  78 y.o. M with COPD not on home O2, PVD s/p right fem endarterectomy and stent, Ulcerative colitis, Barrett's esophagus, GERD,HLD, HyerK, HypoNa and HTN who presented (9/18) with N/V/D for several weeks.  In the ER, he was SOB and CXR without opacities.   Clinical Impression   PTA, pt was independent with ADL and mobility and enjoyed working in his yard. Pt currently on 3L O2. At rest 96. With activity and walking @ 80 ft in room, SpO2 80 with good pleth and 3/4 DOE. Rebounds to 90s in @ 2 min with verbal and tactile cues for pursed lip breathing. Pt states he is anxious because he has a "hard time not switching up his breathing". Educated on pursed lip breathing and used theraband exercises to help him control his respiratory rate (pull the band  = breath in through his nose; release the band = blow out through his mouth) - excellent response with this. Completed incentive spirometer x 10 - able to pull 1200 ml.  Feel pt will progress to DC home without further OT needs. Will follow acutely.     Follow Up Recommendations  No OT follow up;Supervision - Intermittent    Equipment Recommendations  None recommended by OT    Recommendations for Other Services       Precautions / Restrictions Precautions Precautions: Fall;Other (comment) Precaution Comments: Desats with activity Restrictions Weight Bearing Restrictions: No      Mobility Bed Mobility               General bed mobility comments: OOB in chair  Transfers Overall transfer level: Needs assistance Equipment used: None Transfers: Sit to/from Stand Sit to Stand: Supervision              Balance Overall balance assessment: No apparent balance deficits (not formally assessed)                                         ADL either performed or  assessed with clinical judgement   ADL Overall ADL's : Needs assistance/impaired     Grooming: Set up;Sitting   Upper Body Bathing: Set up;Sitting   Lower Body Bathing: Min guard;Sit to/from stand   Upper Body Dressing : Set up;Sitting   Lower Body Dressing: Sit to/from stand;Supervision/safety   Toilet Transfer: Ambulation;Supervision/safety   Toileting- Clothing Manipulation and Hygiene: Supervision/safety       Functional mobility during ADLs: Supervision/safety General ADL Comments: Pt desats into low 80s on 3L with activity     Vision         Perception     Praxis      Pertinent Vitals/Pain Pain Assessment: No/denies pain     Hand Dominance Right   Extremity/Trunk Assessment Upper Extremity Assessment Upper Extremity Assessment: Generalized weakness   Lower Extremity Assessment Lower Extremity Assessment: Defer to PT evaluation   Cervical / Trunk Assessment Cervical / Trunk Assessment: Normal   Communication Communication Communication: No difficulties   Cognition Arousal/Alertness: Awake/alert Behavior During Therapy: WFL for tasks assessed/performed Overall Cognitive Status: Within Functional Limits for tasks assessed  General Comments  Pt anxious about "mixing up his breathing" when educated on pursed lip breathing    Exercises Other Exercises Other Exercises: incentive spirometer x 10. able to pull 1200 ml   Shoulder Instructions      Home Living Family/patient expects to be discharged to:: Private residence Living Arrangements: Alone Available Help at Discharge: Family;Available PRN/intermittently Type of Home: House Home Access: Stairs to enter [employer] of Steps: 2 Entrance Stairs-Rails: Can reach both Home Layout: One level     Bathroom Shower/Tub: Occupational psychologist: Standard Bathroom Accessibility: Yes How Accessible: Accessible via walker Home  Equipment: [place] - 2 wheels;Shower seat - built in;Hand held shower head;Grab bars - tub/shower          Prior Functioning/Environment Level of Independence: Independent        Comments: Enjoys working in his [employer] Problem List: Decreased activity tolerance;Cardiopulmonary status limiting activity;Obesity      OT Treatment/Interventions: Self-care/ADL training    OT Goals(Current goals can be found in the care plan section) Acute Rehab OT Goals Patient Stated Goal: go home and be able to work around house OT Goal Formulation: With patient Time For Goal Achievement: 06/16/19 Potential to Achieve Goals: Good  OT Frequency: Min 3X/week   Barriers to D/C:            Co-evaluation              AM-PAC OT "6 Clicks" Daily Activity     Outcome Measure Help from another person eating meals?: None Help from another person taking care of personal grooming?: A Little Help from another person toileting, which includes using toliet, bedpan, or urinal?: A Little Help from another person bathing (including washing, rinsing, drying)?: A Little Help from another person to put on and taking off regular upper body clothing?: A Little Help from another person to put on and taking off regular lower body clothing?: A Little 6 Click Score: 19   End of Session Equipment Utilized During Treatment: Oxygen(4L) Nurse Communication: Mobility status  Activity Tolerance: Patient tolerated treatment well Patient left: in chair;with call bell/phone within reach  OT Visit Diagnosis: Unsteadiness on feet (R26.81);Muscle weakness (generalized) (M62.81)                Time: 3403-7096 OT Time Calculation (min): 35 min Charges:  OT General Charges $OT Visit: 1 Visit OT Evaluation $OT Eval Moderate Complexity: 1 Mod OT Treatments $Self Care/Home Management : 8-22 mins  Maurie Boettcher, OT/L   Acute OT Clinical Specialist Acute Rehabilitation Services Pager [phone] Office  [phone]   [facility] 06/02/2019, 5:23 PM

## 2019-06-02 NOTE — Progress Notes (Signed)
Updated patients primary care contact on patient status and plan of care. All questions answered at this time

## 2019-06-02 NOTE — Progress Notes (Signed)
Physical Therapy Treatment Patient Details Name: [name] [name] MRN: [id number] DOB: [birth year] Today's Date: 06/02/2019    History of Present Illness  78 y.o. M with COPD not on home O2, PVD s/p right fem endarterectomy and stent, Ulcerative colitis, Barrett's esophagus, GERD,HLD, HyerK, HypoNa and HTN who presented (9/18) with N/V/D for several weeks.  In the ER, he was SOB and CXR without opacities.    PT Comments    Pt doing much better with mobility, still having some difficulty with pursed lip breathing but was better able to tolerate tx today, and complete more than previous session. At rest on 3L/min sats in low 90s, with ambulation approx. 175f does well only desats once completed distance and resting, takes approx. 2 mins to recover to 85%, on latter attempt ambulated 1338fand took increased time to recover to 85%.    Follow Up Recommendations  No PT follow up     Equipment Recommendations       Recommendations for Other Services       Precautions / Restrictions Precautions Precautions: Fall;Other (comment) Precaution Comments: Desats with activity Restrictions Weight Bearing Restrictions: No    Mobility  Bed Mobility Overal bed mobility: Modified Independent                Transfers Overall transfer level: Modified independent Equipment used: None             General transfer comment: takes increased time but able to complete without extrenal assist  Ambulation/Gait Ambulation/Gait assistance: Supervision Gait Distance (Feet): 176 Feet Assistive device: None Gait Pattern/deviations: Step-through pattern Gait velocity: slow   General Gait Details: noted 1 LOB but was able to self correct, takes very slow cautious steps. ambulated 1 x [id number] x 132f38f seated rest to complete. each time desats to high 70s initially able to recover to 85% after 2 mins, on second attempt takes 3.5min57mnd on last attempt >4mins45m recover to 85%   Stairs              Wheelchair Mobility    Modified Rankin (Stroke Patients Only)       Balance Overall balance assessment: Modified Independent                                          Cognition Arousal/Alertness: Awake/alert Behavior During Therapy: WFL for tasks assessed/performed Overall Cognitive Status: Within Functional Limits for tasks assessed                                 General Comments: cognition appears to be baseline, pt just has diffiuclty understanding medical concepts ie desat with activity states does not feel any difference between activity and rest      Exercises      General Comments General comments (skin integrity, edema, etc.): able to tolerate more today and also recover much faster than previous session      Pertinent Vitals/Pain Pain Assessment: No/denies pain    Home Living                      Prior Function            PT Goals (current goals can now be found in the care plan section) Acute Rehab PT Goals Patient Stated Goal: go home and  be able to work around house PT Goal Formulation: With patient Time For Goal Achievement: 06/15/19 Potential to Achieve Goals: Good Progress towards PT goals: Progressing toward goals    Frequency    Min 3X/week      PT Plan Current plan remains appropriate    Co-evaluation              AM-PAC PT "6 Clicks" Mobility   Outcome Measure  Help needed turning from your back to your side while in a flat bed without using bedrails?: None Help needed moving from lying on your back to sitting on the side of a flat bed without using bedrails?: None Help needed moving to and from a bed to a chair (including a wheelchair)?: None Help needed standing up from a chair using your arms (e.g., wheelchair or bedside chair)?: None Help needed to walk in hospital room?: None Help needed climbing 3-5 steps with a railing? : A Little 6 Click Score: 23    End of  Session Equipment Utilized During Treatment: Oxygen Activity Tolerance: Treatment limited secondary to medical complications (Comment) Patient left: in chair;with call bell/phone within reach   PT Visit Diagnosis: Other abnormalities of gait and mobility (R26.89)     Time: 6720-9470 PT Time Calculation (min) (ACUTE ONLY): 41 min  Charges:  $Gait Training: 23-37 mins $Therapeutic Activity: 8-22 mins                     Horald Chestnut, PT    Delford Field 06/02/2019, 1:23 PM

## 2019-06-03 MED ORDER — TOCILIZUMAB 400 MG/20ML IV SOLN
800.0000 mg | Freq: Once | INTRAVENOUS | Status: AC
  Administered 2019-06-03: 800 mg via INTRAVENOUS
  Filled 2019-06-03: qty 40

## 2019-06-03 NOTE — Plan of Care (Signed)

## 2019-06-03 NOTE — Progress Notes (Addendum)
Physical Therapy Treatment Patient Details Name: [name] [name] MRN: [id number] DOB: 1941-06-09 Today's Date: 06/03/2019    History of Present Illness  78 y.o. M with COPD not on home O2, PVD s/p right fem endarterectomy and stent, Ulcerative colitis, Barrett's esophagus, GERD,HLD, HyerK, HypoNa and HTN who presented (9/18) with N/V/D for several weeks.  In the ER, he was SOB and CXR without opacities.    PT Comments    Pt in poor spirits, states he thought he would be going home today. Tolerated slightly less than previous session reporting fatigue after ambulating 46f. Some balance deficits persist but pt is able to self correct w/o falling. Does desat into low 80s with ambulation but is able to recover with pursed lip breathing in 1-2 mins.   Follow Up Recommendations  No PT follow up     Equipment Recommendations       Recommendations for Other Services       Precautions / Restrictions Precautions Precautions: Fall;Other (comment) Precaution Comments: Desats with activity Restrictions Weight Bearing Restrictions: No    Mobility  Bed Mobility Overal bed mobility: Modified Independent             General bed mobility comments: OOB in chair  Transfers Overall transfer level: Needs assistance Equipment used: None Transfers: Sit to/from Stand Sit to Stand: Modified independent (Device/Increase time)            Ambulation/Gait Ambulation/Gait assistance: Supervision Gait Distance (Feet): 88 Feet Assistive device: None Gait Pattern/deviations: Step-through pattern     General Gait Details: 1 LOB got caught in computer in corner of room   Stairs             Wheelchair Mobility    Modified Rankin (Stroke Patients Only)       Balance Overall balance assessment: Modified Independent                                          Cognition Arousal/Alertness: Awake/alert Behavior During Therapy: WFL for tasks  assessed/performed Overall Cognitive Status: Within Functional Limits for tasks assessed                                        Exercises General Exercises - Lower Extremity Ankle Circles/Pumps: 10 reps Long Arc Quad: 10 reps Hip Flexion/Marching: 10 reps    General Comments General comments (skin integrity, edema, etc.): Pt seems to be in poor spirits states that he does not feel as if he is getting any better, does not see difference from how he was previous session and now. he was hoping to be going home today      Pertinent Vitals/Pain Pain Assessment: 0-10 Pain Score: 4  Pain Location: left side of chest , states may be from breathing so hard Pain Intervention(s): Limited activity within patient's tolerance;Monitored during session    Home Living                      Prior Function            PT Goals (current goals can now be found in the care plan section) Acute Rehab PT Goals Patient Stated Goal: go home and be able to work around house Time For Goal Achievement: 06/15/19 Potential to Achieve Goals: Good Progress  towards PT goals: Progressing toward goals    Frequency    Min 3X/week      PT Plan Current plan remains appropriate    Co-evaluation              AM-PAC PT "6 Clicks" Mobility   Outcome Measure  Help needed turning from your back to your side while in a flat bed without using bedrails?: None Help needed moving from lying on your back to sitting on the side of a flat bed without using bedrails?: None Help needed moving to and from a bed to a chair (including a wheelchair)?: None Help needed standing up from a chair using your arms (e.g., wheelchair or bedside chair)?: None Help needed to walk in hospital room?: None Help needed climbing 3-5 steps with a railing? : A Little 6 Click Score: 23    End of Session Equipment Utilized During Treatment: Oxygen Activity Tolerance: Treatment limited secondary to medical  complications (Comment) Patient left: in chair;with call bell/phone within reach   PT Visit Diagnosis: Other abnormalities of gait and mobility (R26.89)     Time: 7944-4619 PT Time Calculation (min) (ACUTE ONLY): 32 min  Charges:  $Gait Training: 8-22 mins $Therapeutic Exercise: 8-22 mins                     Horald Chestnut, PT    Delford Field 06/03/2019, 2:13 PM

## 2019-06-03 NOTE — Progress Notes (Signed)
PROGRESS NOTE    [name] [name]  RNH:[id number] DOB: 08/04/1941 DOA: 05/27/2019 PCP: Biagio Borg, MD   Brief Narrative:  [name] [name] is a 78 y.o. M with COPD not on home O2, PVD s/p right fem endarterectomy and stent, Ulcerative colitis, and HTN who presented with N/V/D for several weeks.  In the ER, he was SOB and CXR without opacities.  Assessment & Plan:   XOVAN-19 with acute hypoxic respiratory failure, POA Nausea, vomiting and diarrhea due to COVID-19 - S/P remdesivir x5 days - Acemtra given 06/03/19 given worsening CRP, oxygen needs over the past 24 hours - CXR - bibasilar atelectasis - personally reviewed - Continue dexamethasone, day 5 - VTE PPx with Lovenox - Continue zinc and vitamin C - Flutter valve, continue to prone, incentive spirometry q2hrs while awake Recent Labs    06/01/19 0220 06/02/19 0145  DDIMER 0.81* 0.97*  FERRITIN 276 318  CRP 14.5* 16.2*   COPD, not in acute exacerbation - No evidence of acute bronchospasm - Continue Symbicort  Hyperkalemia, resolved - Resolved with fluids  Hyponatremia, likely hypovolemic in nature - Resolved with fluids  Ulcerative colitis, history of, stable - No active disease - Continue mesalamine  Hypertension, essential Peripheral vascular disease secondary prevention - BP controlled - Continue Plavix - Continue HCTZ, lisinopril  Elevated liver enzymes - Mild, LFTs stable  Other medications - Continue gabapentin - Continue PPI - Hold allopurinol  MDM and disposition: The below labs and imaging reports were reviewed and summarized above.  Medication management as above. The patient was admitted with COVID-19.  He remains on O2, desaturates to mid 80s on room air.    DVT prophylaxis: Lovenox Code Status: FULL Family Communication: Daughter called - voicemail left - no answer   Procedures:   CXR 9/19 -- bilateral pneumonia  CXR 9/23 -- similar bilateral pneumonia  Subjective: No acute issues  or events overnight, continues to complain of shortness of breath - if anything minimally worsening, worse with exertion, denies chest pain, nausea, vomiting, diarrhea, constipation, headache, fevers, chills.  Objective: Vitals:   06/03/19 0500 06/03/19 0850 06/03/19 0853 06/03/19 0903  BP:  (!) 110/45    Pulse: (!) 56 78 85 74  Resp:      Temp:  98.4 F (36.9 C)    TempSrc:  Oral    SpO2: 93% 90% (!) 82% 92%  Weight:      Height:        Intake/Output Summary (Last 24 hours) at 06/03/2019 1208 Last data filed at 06/03/2019 0844 Gross per 24 hour  Intake 720 ml  Output 850 ml  Net -130 ml   Filed Weights   05/28/19 1225  Weight: 103.4 kg    Examination: General appearance:  adult male, alert and in no acute distress.  Sitting in recliner HEENT: Anicteric, conjunctiva pink, lids and lashes normal. No nasal deformity, discharge, epistaxis.  Lips moist, dentition normal, OP tacky dry no oral lesions, hearing normal.   Skin: Warm and dry.  no jaundice.  No suspicious rashes or lesions. Cardiac: RRR, nl S1-S2, no murmurs appreciated.  Capillary refill is brisk.  JVP normal.  No LE edema.  Radial pulses 2+ and symmetric. Respiratory: Tachypneic at rest.  No wheezing or rales. Abdomen: Abdomen soft.  No TTP or guarding. No ascites, distension, hepatosplenomegaly.   MSK: No deformities or effusions. Neuro: Awake and alert.  EOMI, moves all extremities. Speech fluent.    Psych: Sensorium intact and responding to  questions, attention normal. Affect normal.  Judgment and insight appear normal.  Data Reviewed: I have personally reviewed following labs and imaging studies:  CBC: Recent Labs  Lab 05/29/19 0200 05/30/19 0255 05/31/19 0205 06/01/19 0220 06/02/19 0145  WBC 6.1 9.9 9.7 9.3 9.1  NEUTROABS 5.4 9.1* 8.8* 8.1* 8.0*  HGB 13.3 14.6 14.1 13.8 13.0  HCT 40.3 44.1 41.4 40.2 38.5*  MCV 84.3 83.5 82.8 82.4 83.0  PLT 187 227 236 258 564   Basic Metabolic Panel: Recent Labs   Lab 05/29/19 0200 05/30/19 0255 05/31/19 0205 06/01/19 0220 06/02/19 0145  NA 136 137 136 136 134*  K 4.3 4.2 3.7 3.7 3.9  CL 108 108 105 105 103  CO2 21* 20* 20* 21* 20*  GLUCOSE 132* 117* 134* 106* 119*  BUN 21 27* 24* 30* 31*  CREATININE 0.88 0.86 0.81 0.92 0.93  CALCIUM 8.1* 8.5* 8.3* 8.1* 8.1*  MG 1.9 1.9 1.8 1.9 2.0   GFR: Estimated Creatinine Clearance: 77.5 mL/min (by C-G formula based on SCr of 0.93 mg/dL). Liver Function Tests: Recent Labs  Lab 05/29/19 0200 05/30/19 0255 05/31/19 0205 06/01/19 0220 06/02/19 0145  AST 33 38 34 31 27  ALT 18 26 28 30 29   ALKPHOS 54 56 54 60 60  BILITOT 0.7 0.6 0.8 0.9 0.9  PROT 5.5* 6.0* 5.8* 5.8* 5.6*  ALBUMIN 2.6* 2.9* 2.8* 2.7* 2.6*   Recent Labs  Lab 05/27/19 1536  LIPASE 24   No results for input(s): AMMONIA in the last 168 hours. Coagulation Profile: No results for input(s): INR, PROTIME in the last 168 hours. Cardiac Enzymes: No results for input(s): CKTOTAL, CKMB, CKMBINDEX, TROPONINI in the last 168 hours. BNP (last 3 results) Recent Labs    02/07/19 0954  PROBNP 57.0    Recent Results (from the past 240 hour(s))  SARS Coronavirus 2 [facility] order, Performed in [facility] hospital lab) Nasopharyngeal Nasopharyngeal Swab     Status: Abnormal   Collection Time: 05/28/19  4:38 AM   Specimen: Nasopharyngeal Swab  Result Value Ref Range Status   SARS Coronavirus 2 POSITIVE (A) NEGATIVE Final    Comment: RESULT CALLED TO, READ BACK BY AND VERIFIED WITH: RN M BROOKS @ [phone] 05/28/19 BY S GEZAHEGN (NOTE) If result is NEGATIVE SARS-CoV-2 target nucleic acids are NOT DETECTED. The SARS-CoV-2 RNA is generally detectable in upper and lower  respiratory specimens during the acute phase of infection. The lowest  concentration of SARS-CoV-2 viral copies this assay can detect is 250  copies / mL. A negative result does not preclude SARS-CoV-2 infection  and should not be used as the sole basis for treatment or other   patient management decisions.  A negative result may occur with  improper specimen collection / handling, submission of specimen other  than nasopharyngeal swab, presence of viral mutation(s) within the  areas targeted by this assay, and inadequate number of viral copies  (<250 copies / mL). A negative result must be combined with clinical  observations, patient history, and epidemiological information. If result is POSITIVE SARS-CoV-2 target nucleic acids are DETECTE D. The SARS-CoV-2 RNA is generally detectable in upper and lower  respiratory specimens during the acute phase of infection.  Positive  results are indicative of active infection with SARS-CoV-2.  Clinical  correlation with patient history and other diagnostic information is  necessary to determine patient infection status.  Positive results do  not rule out bacterial infection or co-infection with other viruses. If result is PRESUMPTIVE  POSTIVE SARS-CoV-2 nucleic acids MAY BE PRESENT.   A presumptive positive result was obtained on the submitted specimen  and confirmed on repeat testing.  While 2019 novel coronavirus  (SARS-CoV-2) nucleic acids may be present in the submitted sample  additional confirmatory testing may be necessary for epidemiological  and / or clinical management purposes  to differentiate between  SARS-CoV-2 and other Sarbecovirus currently known to infect humans.  If clinically indicated additional testing with an alternate test  methodology (LAB745 3) is advised. The SARS-CoV-2 RNA is generally  detectable in upper and lower respiratory specimens during the acute  phase of infection. The expected result is Negative. Fact Sheet for Patients:  StrictlyIdeas.no Fact Sheet for Healthcare Providers: BankingDealers.co.za This test is not yet approved or cleared by the [place] FDA and has been authorized for detection and/or diagnosis of SARS-CoV-2 by  FDA under an Emergency Use Authorization (EUA).  This EUA will remain in effect (meaning this test can be used) for the duration of the COVID-19 declaration under Section 564(b)(1) of the Act, 21 U.S.C. section 360bbb-3(b)(1), unless the authorization is terminated or revoked sooner. Performed at [facility], [place] [address], [place], [place] [address]     Radiology Studies: No results found. Scheduled Meds: . clopidogrel  75 mg Oral Daily  . dexamethasone (DECADRON) injection  6 mg Intravenous Q24H  . enoxaparin (LOVENOX) injection  40 mg Subcutaneous Q24H  . gabapentin  100 mg Oral QHS  . hydrochlorothiazide  25 mg Oral Daily  . Ipratropium-Albuterol  1 puff Inhalation Q6H  . lisinopril  20 mg Oral Daily  . mesalamine  1,500 mg Oral QHS  . mometasone-formoterol  2 puff Inhalation BID  . pantoprazole  40 mg Oral Daily  . sodium chloride flush  3 mL Intravenous Q12H  . cyanocobalamin  1,000 mcg Oral Daily  . vitamin C  500 mg Oral Daily  . zinc sulfate  220 mg Oral Daily   Continuous Infusions: . tocilizumab (ACTEMRA) IV       LOS: 6 days   Time spent: 25 minutes   Little Ishikawa, DO [facility] 06/03/2019, 12:08 PM    Please page through [place]:  [email or web address] Contact charge nurse for password If 7PM-7AM, please contact night-coverage

## 2019-06-03 NOTE — Progress Notes (Signed)
Called and updated patient's daughter, Arrie Aran, on patient's progress during the night.  All questions/concerns addressed.  Earleen Reaper RN

## 2019-06-04 LAB — C-REACTIVE PROTEIN: CRP: 7.3 mg/dL — ABNORMAL HIGH (ref <1.0)

## 2019-06-04 LAB — D-DIMER, QUANTITATIVE: D-Dimer, Quant: 2.1 ug/mL-FEU — ABNORMAL HIGH (ref 0.00–0.50)

## 2019-06-04 LAB — FERRITIN: Ferritin: 292 ng/mL (ref 24–336)

## 2019-06-04 MED ORDER — ORAL CARE MOUTH RINSE
15.0000 mL | Freq: Two times a day (BID) | OROMUCOSAL | Status: DC
  Administered 2019-06-04 – 2019-06-06 (×5): 15 mL via OROMUCOSAL

## 2019-06-04 NOTE — Progress Notes (Signed)
Called and spoke with daughter, Arrie Aran.  Updated on patient's condition.  All questions answered.  She is very appreciative of the care her father is receiving.  Earleen Reaper RN

## 2019-06-04 NOTE — Progress Notes (Signed)
PROGRESS NOTE    [name] [name]  WGY:[id number] DOB: 11-15-1940 DOA: 05/27/2019 PCP: Biagio Borg, MD   Brief Narrative:  Mr. Plotts is a 78 y.o. M with COPD not on home O2, PVD s/p right fem endarterectomy and stent, Ulcerative colitis, and HTN who presented with N/V/D for several weeks.  In the ER, he was SOB and CXR without opacities.  Assessment & Plan:   [id number] with acute hypoxic respiratory failure, POA, improving Nausea, vomiting and diarrhea due to COVID-19 resolved - S/P remdesivir x5 days - Acemtra given 06/03/19 now with markedly improved CRP and symptoms - Patient able to wean off of oxygen at rest today on room air satting mid 90s, with ambulation patient did desat to 84 on room air this is a marked improvement in the past 48 to 72 hours. - CXR - bibasilar atelectasis - personally reviewed - Continue dexamethasone, day 5 - VTE PPx with Lovenox - Continue zinc and vitamin C - Flutter valve, continue to prone, incentive spirometry q2hrs while awake Recent Labs    06/02/19 0145 06/04/19 0504  DDIMER 0.97* 2.10*  FERRITIN 318 292  CRP 16.2* 7.3*   COPD, not in acute exacerbation - No evidence of acute bronchospasm - Continue Symbicort  Hyperkalemia, resolved - Resolved with fluids  Hyponatremia, likely hypovolemic in nature - Resolved with fluids  Ulcerative colitis, history of, stable - No active disease - Continue mesalamine  Hypertension, essential Peripheral vascular disease secondary prevention - BP controlled - Continue Plavix - Continue HCTZ, lisinopril  Elevated liver enzymes - Mild, LFTs stable  Other medications - Continue gabapentin - Continue PPI - Hold allopurinol  MDM and disposition: The below labs and imaging reports were reviewed and summarized above.  Medication management as above. The patient was admitted with COVID-19.  He remains on O2, desaturates to mid 80s on room air.   DVT prophylaxis: Lovenox Code Status: FULL  Disposition: Likely home in the next 24 to 48 hours pending clinical course, resolution of hypoxia Family Communication: Daughter called, lengthy discussion about patient's condition and likely improvement in the next 24 to 48 hours given new medications and improving symptoms.  Procedures:   CXR 9/19 -- bilateral pneumonia  CXR 9/23 -- similar bilateral pneumonia  Subjective: No acute issues or events overnight, continues to complain of shortness of breath - if anything minimally worsening, worse with exertion, denies chest pain, nausea, vomiting, diarrhea, constipation, headache, fevers, chills.  Objective: Vitals:   06/04/19 0500 06/04/19 0600 06/04/19 0800 06/04/19 0805  BP:    (!) 112/42  Pulse: 67 (!) 53 86 67  Resp:      Temp:    97.8 F (36.6 C)  TempSrc:    Oral  SpO2: 95% 99% (!) 74% (!) 88%  Weight:      Height:        Intake/Output Summary (Last 24 hours) at 06/04/2019 0919 Last data filed at 06/04/2019 0805 Gross per 24 hour  Intake 940 ml  Output 1675 ml  Net -735 ml   Filed Weights   05/28/19 1225  Weight: 103.4 kg    Examination: General appearance:  adult male, alert and in no acute distress.  Sitting in recliner HEENT: Anicteric, conjunctiva pink, lids and lashes normal. No nasal deformity, discharge, epistaxis.  Lips moist, dentition normal, OP tacky dry no oral lesions, hearing normal.   Skin: Warm and dry.  no jaundice.  No suspicious rashes or lesions. Cardiac: RRR, nl S1-S2, no  murmurs appreciated.  Capillary refill is brisk.  JVP normal.  No LE edema.  Radial pulses 2+ and symmetric. Respiratory: Tachypneic at rest.  No wheezing or rales. Abdomen: Abdomen soft.  No TTP or guarding. No ascites, distension, hepatosplenomegaly.   MSK: No deformities or effusions. Neuro: Awake and alert.  EOMI, moves all extremities. Speech fluent.    Psych: Sensorium intact and responding to questions, attention normal. Affect normal.  Judgment and insight appear  normal.  Data Reviewed: I have personally reviewed following labs and imaging studies:  CBC: Recent Labs  Lab 05/29/19 0200 05/30/19 0255 05/31/19 0205 06/01/19 0220 06/02/19 0145  WBC 6.1 9.9 9.7 9.3 9.1  NEUTROABS 5.4 9.1* 8.8* 8.1* 8.0*  HGB 13.3 14.6 14.1 13.8 13.0  HCT 40.3 44.1 41.4 40.2 38.5*  MCV 84.3 83.5 82.8 82.4 83.0  PLT 187 227 236 258 825   Basic Metabolic Panel: Recent Labs  Lab 05/29/19 0200 05/30/19 0255 05/31/19 0205 06/01/19 0220 06/02/19 0145  NA 136 137 136 136 134*  K 4.3 4.2 3.7 3.7 3.9  CL 108 108 105 105 103  CO2 21* 20* 20* 21* 20*  GLUCOSE 132* 117* 134* 106* 119*  BUN 21 27* 24* 30* 31*  CREATININE 0.88 0.86 0.81 0.92 0.93  CALCIUM 8.1* 8.5* 8.3* 8.1* 8.1*  MG 1.9 1.9 1.8 1.9 2.0   GFR: Estimated Creatinine Clearance: 77.5 mL/min (by C-G formula based on SCr of 0.93 mg/dL). Liver Function Tests: Recent Labs  Lab 05/29/19 0200 05/30/19 0255 05/31/19 0205 06/01/19 0220 06/02/19 0145  AST 33 38 34 31 27  ALT 18 26 28 30 29   ALKPHOS 54 56 54 60 60  BILITOT 0.7 0.6 0.8 0.9 0.9  PROT 5.5* 6.0* 5.8* 5.8* 5.6*  ALBUMIN 2.6* 2.9* 2.8* 2.7* 2.6*   No results for input(s): LIPASE, AMYLASE in the last 168 hours. No results for input(s): AMMONIA in the last 168 hours. Coagulation Profile: No results for input(s): INR, PROTIME in the last 168 hours. Cardiac Enzymes: No results for input(s): CKTOTAL, CKMB, CKMBINDEX, TROPONINI in the last 168 hours. BNP (last 3 results) Recent Labs    02/07/19 0954  PROBNP 57.0    Recent Results (from the past 240 hour(s))  SARS Coronavirus 2 [facility] order, Performed in [facility] hospital lab) Nasopharyngeal Nasopharyngeal Swab     Status: Abnormal   Collection Time: 05/28/19  4:38 AM   Specimen: Nasopharyngeal Swab  Result Value Ref Range Status   SARS Coronavirus 2 POSITIVE (A) NEGATIVE Final    Comment: RESULT CALLED TO, READ BACK BY AND VERIFIED WITH: RN M BROOKS @ [phone] 05/28/19 BY S  GEZAHEGN (NOTE) If result is NEGATIVE SARS-CoV-2 target nucleic acids are NOT DETECTED. The SARS-CoV-2 RNA is generally detectable in upper and lower  respiratory specimens during the acute phase of infection. The lowest  concentration of SARS-CoV-2 viral copies this assay can detect is 250  copies / mL. A negative result does not preclude SARS-CoV-2 infection  and should not be used as the sole basis for treatment or other  patient management decisions.  A negative result may occur with  improper specimen collection / handling, submission of specimen other  than nasopharyngeal swab, presence of viral mutation(s) within the  areas targeted by this assay, and inadequate number of viral copies  (<250 copies / mL). A negative result must be combined with clinical  observations, patient history, and epidemiological information. If result is POSITIVE SARS-CoV-2 target nucleic acids are DETECTE D.  The SARS-CoV-2 RNA is generally detectable in upper and lower  respiratory specimens during the acute phase of infection.  Positive  results are indicative of active infection with SARS-CoV-2.  Clinical  correlation with patient history and other diagnostic information is  necessary to determine patient infection status.  Positive results do  not rule out bacterial infection or co-infection with other viruses. If result is PRESUMPTIVE POSTIVE SARS-CoV-2 nucleic acids MAY BE PRESENT.   A presumptive positive result was obtained on the submitted specimen  and confirmed on repeat testing.  While 2019 novel coronavirus  (SARS-CoV-2) nucleic acids may be present in the submitted sample  additional confirmatory testing may be necessary for epidemiological  and / or clinical management purposes  to differentiate between  SARS-CoV-2 and other Sarbecovirus currently known to infect humans.  If clinically indicated additional testing with an alternate test  methodology (LAB745 3) is advised. The  SARS-CoV-2 RNA is generally  detectable in upper and lower respiratory specimens during the acute  phase of infection. The expected result is Negative. Fact Sheet for Patients:  StrictlyIdeas.no Fact Sheet for Healthcare Providers: BankingDealers.co.za This test is not yet approved or cleared by the [place] FDA and has been authorized for detection and/or diagnosis of SARS-CoV-2 by FDA under an Emergency Use Authorization (EUA).  This EUA will remain in effect (meaning this test can be used) for the duration of the COVID-19 declaration under Section 564(b)(1) of the Act, 21 U.S.C. section 360bbb-3(b)(1), unless the authorization is terminated or revoked sooner. Performed at [facility], [place] [address], [place], [place] [address]     Radiology Studies: No results found. Scheduled Meds: . clopidogrel  75 mg Oral Daily  . dexamethasone (DECADRON) injection  6 mg Intravenous Q24H  . enoxaparin (LOVENOX) injection  40 mg Subcutaneous Q24H  . gabapentin  100 mg Oral QHS  . hydrochlorothiazide  25 mg Oral Daily  . Ipratropium-Albuterol  1 puff Inhalation Q6H  . lisinopril  20 mg Oral Daily  . mouth rinse  15 mL Mouth Rinse BID  . mesalamine  1,500 mg Oral QHS  . mometasone-formoterol  2 puff Inhalation BID  . pantoprazole  40 mg Oral Daily  . sodium chloride flush  3 mL Intravenous Q12H  . cyanocobalamin  1,000 mcg Oral Daily  . vitamin C  500 mg Oral Daily  . zinc sulfate  220 mg Oral Daily   Continuous Infusions:    LOS: 7 days   Time spent: 25 minutes   Little Ishikawa, DO [facility] 06/04/2019, 9:19 AM    Please page through [place]:  [email or web address] Contact charge nurse for password If 7PM-7AM, please contact night-coverage

## 2019-06-04 NOTE — TOC Progression Note (Signed)
Transition of Care [facility]) - Progression Note    Patient Details  Name: AUDIEL SCHEIBER MRN: [id number] Date of Birth: 27-Apr-1941  Transition of Care [facility] CM/SW Contact  Loletha Grayer Beverely Pace, RN Phone Number:  [phone] (working remotely) 06/04/2019, 10:32 AM  Clinical Narrative:   Case manager is following for needs. None identified at this time.           Expected Discharge Plan and Services                                                 Social Determinants of Health (SDOH) Interventions    Readmission Risk Interventions Readmission Risk Prevention Plan 07/20/2018  Post Dischage Appt Complete  Medication Screening Complete  Transportation Screening Complete  PCP follow-up Complete  Some recent data might be hidden

## 2019-06-04 NOTE — Plan of Care (Signed)
  Problem: Coping: Goal: Psychosocial and spiritual needs will be supported Outcome: Progressing   Problem: Education: Goal: Knowledge of General Education information will improve Description: Including pain rating scale, medication(s)/side effects and non-pharmacologic comfort measures Outcome: Progressing   Problem: Clinical Measurements: Goal: Diagnostic test results will improve Outcome: Progressing   Problem: Activity: Goal: Risk for activity intolerance will decrease Outcome: Progressing   Problem: Nutrition: Goal: Adequate nutrition will be maintained Outcome: Progressing   Problem: Coping: Goal: Level of anxiety will decrease Outcome: Progressing

## 2019-06-04 NOTE — Progress Notes (Signed)
SATURATION QUALIFICATIONS: (This note is used to comply with regulatory documentation for home oxygen)  Patient Saturations on Room Air at Rest = 95%  Patient Saturations on Room Air while Ambulating = 84%  Patient Saturations on 2 Liters of oxygen while Ambulating = 93%  Please briefly explain why patient needs home oxygen: Patient ambulated from window to door, in room, twice, approximately 35f walked maintaining Spo2 >90%, until desaturating to 84% on room air on returned lap. Noted dyspnea with exertion,  2L o2 via [place] applied, patient returned to bed, with deep breathing and rest, Spo2 improved to 93%.     06/04/19 1359  Vitals  Pulse Rate 94  Pulse Rate Source Monitor  Oxygen Therapy  SpO2 (!) 84 %  O2 Device Room Air  Patient Activity (if Appropriate) Ambulating (226fin room)  Pulse Oximetry Type Continuous  MEWS Score  MEWS RR 0  MEWS Pulse 0  MEWS Systolic 0  MEWS LOC 0  MEWS Temp 0  MEWS Score 0  MEWS Score Color Green

## 2019-06-05 LAB — D-DIMER, QUANTITATIVE: D-Dimer, Quant: 2.37 ug/mL-FEU — ABNORMAL HIGH (ref 0.00–0.50)

## 2019-06-05 LAB — FERRITIN: Ferritin: 282 ng/mL (ref 24–336)

## 2019-06-05 LAB — C-REACTIVE PROTEIN: CRP: 2.7 mg/dL — ABNORMAL HIGH (ref <1.0)

## 2019-06-05 NOTE — Progress Notes (Signed)
SATURATION QUALIFICATIONS: (This note is used to comply with regulatory documentation for home oxygen)  Patient Saturations on Room Air at Rest = 87%  Patient Saturations on 2 Liters of oxygen while Ambulating 28f in hallway = 81%  Patient Saturations on 4 Liters of oxygen while Ambulating = 86%  Please briefly explain why patient needs home oxygen:  Patient walked in hallway with 2L o2 via [place].  Patient denied shortness of breath, lightheadedness, or dizziness, but dyspnea and oxygen desaturation noted with walking activity.  Spo2 as low as 81% on 2L o2 via [place], nursing increased to 4L o2 on return walk to room/chair.  Upon resting and deep breathing in chair, weaned patient o2 back to 2L with hx COPD, spo2 improved >90% at rest.

## 2019-06-05 NOTE — Plan of Care (Signed)
  Problem: Respiratory: Goal: Will maintain a patent airway Outcome: Progressing Goal: Complications related to the disease process, condition or treatment will be avoided or minimized Outcome: Progressing   

## 2019-06-05 NOTE — Progress Notes (Signed)
PROGRESS NOTE    [name] [name]  UDJ:[id number] DOB: 09-28-1940 DOA: 05/27/2019 PCP: [name] Borg, MD   Brief Narrative:  [name] is a 78 y.o. M with COPD not on home O2, PVD s/p right fem endarterectomy and stent, Ulcerative colitis, and HTN who presented with N/V/D for several weeks.  In the ER, he was SOB and CXR without opacities.  Assessment & Plan:   [id number]-27 with acute hypoxic respiratory failure, POA, improving Nausea, vomiting and diarrhea due to COVID-19 resolved - S/P remdesivir x5 days - Acemtra given 06/03/19 with markedly improved CRP and symptoms - Patient noted to have marked dyspnea today early this morning desatted into the 70s on room air while ambulating around his room. Continue close monitoring daily ambulatory O2 screens. - CXR - bibasilar atelectasis - personally reviewed - Continue dexamethasone, day 5 - VTE PPx with Lovenox - Continue zinc and vitamin C - Flutter valve, continue to prone, incentive spirometry q2hrs while awake Recent Labs    06/04/19 0504 06/05/19 0338  DDIMER 2.10* 2.37*  FERRITIN 292 282  CRP 7.3* 2.7*   COPD, not in acute exacerbation - No evidence of acute bronchospasm - Continue Symbicort  Hyperkalemia, resolved - Resolved with fluids  Hyponatremia, likely hypovolemic in nature - Resolved with fluids  Ulcerative colitis, history of, stable - No active disease - Continue mesalamine  Hypertension, essential Peripheral vascular disease secondary prevention - BP controlled - Continue Plavix - Continue HCTZ, lisinopril  Elevated liver enzymes - Mild, LFTs stable  Other medications - Continue gabapentin - Continue PPI - Hold allopurinol  MDM and disposition: The below labs and imaging reports were reviewed and summarized above.  Medication management as above. The patient was admitted with COVID-19.  He remains on O2, desaturates to mid 80s on room air.   DVT prophylaxis: Lovenox Code Status: FULL  Disposition: Likely home in the next 24 to 48 hours pending clinical course, resolution of hypoxia Family Communication: Daughter called, lengthy discussion about patient's condition and likely improvement in the next 24 to 48 hours given new medications and improving symptoms.  Procedures:   CXR 9/19 -- bilateral pneumonia  CXR 9/23 -- similar bilateral pneumonia  Subjective: No acute issues or events overnight, continues to complain of shortness of breath - if anything minimally worsening, worse with exertion, denies chest pain, nausea, vomiting, diarrhea, constipation, headache, fevers, chills.  Objective: Vitals:   06/05/19 0500 06/05/19 0600 06/05/19 0735 06/05/19 0745  BP:    (!) 106/56  Pulse: 82 70 88 66  Resp:   (!) 28 (!) 21  Temp:    99 F (37.2 C)  TempSrc:    Oral  SpO2: 93% 95% (!) 75% 91%  Weight:      Height:        Intake/Output Summary (Last 24 hours) at 06/05/2019 1403 Last data filed at 06/05/2019 1002 Gross per 24 hour  Intake 723 ml  Output 1000 ml  Net -277 ml   Filed Weights   05/28/19 1225  Weight: 103.4 kg    Examination: General appearance:  adult male, alert and in no acute distress.  Sitting in recliner HEENT: Anicteric, conjunctiva pink, lids and lashes normal. No nasal deformity, discharge, epistaxis.  Lips moist, dentition normal, OP tacky dry no oral lesions, hearing normal.   Skin: Warm and dry.  no jaundice.  No suspicious rashes or lesions. Cardiac: RRR, nl S1-S2, no murmurs appreciated.  Capillary refill is brisk.  JVP normal.  No LE edema.  Radial pulses 2+ and symmetric. Respiratory: Tachypneic at rest.  No wheezing or rales. Abdomen: Abdomen soft.  No TTP or guarding. No ascites, distension, hepatosplenomegaly.   MSK: No deformities or effusions. Neuro: Awake and alert.  EOMI, moves all extremities. Speech fluent.    Psych: Sensorium intact and responding to questions, attention normal. Affect normal.  Judgment and insight appear  normal.  Data Reviewed: I have personally reviewed following labs and imaging studies:  CBC: Recent Labs  Lab 05/30/19 0255 05/31/19 0205 06/01/19 0220 06/02/19 0145  WBC 9.9 9.7 9.3 9.1  NEUTROABS 9.1* 8.8* 8.1* 8.0*  HGB 14.6 14.1 13.8 13.0  HCT 44.1 41.4 40.2 38.5*  MCV 83.5 82.8 82.4 83.0  PLT 227 236 258 626   Basic Metabolic Panel: Recent Labs  Lab 05/30/19 0255 05/31/19 0205 06/01/19 0220 06/02/19 0145  NA 137 136 136 134*  K 4.2 3.7 3.7 3.9  CL 108 105 105 103  CO2 20* 20* 21* 20*  GLUCOSE 117* 134* 106* 119*  BUN 27* 24* 30* 31*  CREATININE 0.86 0.81 0.92 0.93  CALCIUM 8.5* 8.3* 8.1* 8.1*  MG 1.9 1.8 1.9 2.0   GFR: Estimated Creatinine Clearance: 77.5 mL/min (by C-G formula based on SCr of 0.93 mg/dL). Liver Function Tests: Recent Labs  Lab 05/30/19 0255 05/31/19 0205 06/01/19 0220 06/02/19 0145  AST 38 34 31 27  ALT 26 28 30 29   ALKPHOS 56 54 60 60  BILITOT 0.6 0.8 0.9 0.9  PROT 6.0* 5.8* 5.8* 5.6*  ALBUMIN 2.9* 2.8* 2.7* 2.6*   No results for input(s): LIPASE, AMYLASE in the last 168 hours. No results for input(s): AMMONIA in the last 168 hours. Coagulation Profile: No results for input(s): INR, PROTIME in the last 168 hours. Cardiac Enzymes: No results for input(s): CKTOTAL, CKMB, CKMBINDEX, TROPONINI in the last 168 hours. BNP (last 3 results) Recent Labs    02/07/19 0954  PROBNP 57.0    Recent Results (from the past 240 hour(s))  SARS Coronavirus 2 [facility] order, Performed in [facility] hospital lab) Nasopharyngeal Nasopharyngeal Swab     Status: Abnormal   Collection Time: 05/28/19  4:38 AM   Specimen: Nasopharyngeal Swab  Result Value Ref Range Status   SARS Coronavirus 2 POSITIVE (A) NEGATIVE Final    Comment: RESULT CALLED TO, READ BACK BY AND VERIFIED WITH: RN M BROOKS @ [phone] 05/28/19 BY S GEZAHEGN (NOTE) If result is NEGATIVE SARS-CoV-2 target nucleic acids are NOT DETECTED. The SARS-CoV-2 RNA is generally detectable in  upper and lower  respiratory specimens during the acute phase of infection. The lowest  concentration of SARS-CoV-2 viral copies this assay can detect is 250  copies / mL. A negative result does not preclude SARS-CoV-2 infection  and should not be used as the sole basis for treatment or other  patient management decisions.  A negative result may occur with  improper specimen collection / handling, submission of specimen other  than nasopharyngeal swab, presence of viral mutation(s) within the  areas targeted by this assay, and inadequate number of viral copies  (<250 copies / mL). A negative result must be combined with clinical  observations, patient history, and epidemiological information. If result is POSITIVE SARS-CoV-2 target nucleic acids are DETECTE D. The SARS-CoV-2 RNA is generally detectable in upper and lower  respiratory specimens during the acute phase of infection.  Positive  results are indicative of active infection with SARS-CoV-2.  Clinical  correlation with patient history and  other diagnostic information is  necessary to determine patient infection status.  Positive results do  not rule out bacterial infection or co-infection with other viruses. If result is PRESUMPTIVE POSTIVE SARS-CoV-2 nucleic acids MAY BE PRESENT.   A presumptive positive result was obtained on the submitted specimen  and confirmed on repeat testing.  While 2019 novel coronavirus  (SARS-CoV-2) nucleic acids may be present in the submitted sample  additional confirmatory testing may be necessary for epidemiological  and / or clinical management purposes  to differentiate between  SARS-CoV-2 and other Sarbecovirus currently known to infect humans.  If clinically indicated additional testing with an alternate test  methodology (LAB745 3) is advised. The SARS-CoV-2 RNA is generally  detectable in upper and lower respiratory specimens during the acute  phase of infection. The expected result is  Negative. Fact Sheet for Patients:  StrictlyIdeas.no Fact Sheet for Healthcare Providers: BankingDealers.co.za This test is not yet approved or cleared by the [place] FDA and has been authorized for detection and/or diagnosis of SARS-CoV-2 by FDA under an Emergency Use Authorization (EUA).  This EUA will remain in effect (meaning this test can be used) for the duration of the COVID-19 declaration under Section 564(b)(1) of the Act, 21 U.S.C. section 360bbb-3(b)(1), unless the authorization is terminated or revoked sooner. Performed at [facility], [place] [address], [place], [place] [address]     Radiology Studies: No results found. Scheduled Meds: . clopidogrel  75 mg Oral Daily  . dexamethasone (DECADRON) injection  6 mg Intravenous Q24H  . enoxaparin (LOVENOX) injection  40 mg Subcutaneous Q24H  . gabapentin  100 mg Oral QHS  . hydrochlorothiazide  25 mg Oral Daily  . Ipratropium-Albuterol  1 puff Inhalation Q6H  . lisinopril  20 mg Oral Daily  . mouth rinse  15 mL Mouth Rinse BID  . mesalamine  1,500 mg Oral QHS  . mometasone-formoterol  2 puff Inhalation BID  . pantoprazole  40 mg Oral Daily  . sodium chloride flush  3 mL Intravenous Q12H  . cyanocobalamin  1,000 mcg Oral Daily  . vitamin C  500 mg Oral Daily  . zinc sulfate  220 mg Oral Daily     LOS: 8 days   Time spent: 25 minutes   Little Ishikawa, DO [facility] 06/05/2019, 2:03 PM   Please page through [place]:  [email or web address] Contact charge nurse for password If 7PM-7AM, please contact night-coverage

## 2019-06-06 ENCOUNTER — Encounter (HOSPITAL_COMMUNITY): Payer: Self-pay

## 2019-06-06 ENCOUNTER — Inpatient Hospital Stay (HOSPITAL_COMMUNITY)
Admission: EM | Admit: 2019-06-06 | Discharge: 2019-07-10 | DRG: 177 | Disposition: E | Payer: Medicare Other | Attending: Internal Medicine | Admitting: Internal Medicine

## 2019-06-06 ENCOUNTER — Other Ambulatory Visit: Payer: Self-pay

## 2019-06-06 DIAGNOSIS — J440 Chronic obstructive pulmonary disease with acute lower respiratory infection: Secondary | ICD-10-CM | POA: Diagnosis not present

## 2019-06-06 DIAGNOSIS — R7989 Other specified abnormal findings of blood chemistry: Secondary | ICD-10-CM

## 2019-06-06 DIAGNOSIS — R748 Abnormal levels of other serum enzymes: Secondary | ICD-10-CM | POA: Diagnosis present

## 2019-06-06 DIAGNOSIS — K2270 Barrett's esophagus without dysplasia: Secondary | ICD-10-CM | POA: Diagnosis present

## 2019-06-06 DIAGNOSIS — J9382 Other air leak: Secondary | ICD-10-CM | POA: Diagnosis not present

## 2019-06-06 DIAGNOSIS — E875 Hyperkalemia: Secondary | ICD-10-CM | POA: Diagnosis present

## 2019-06-06 DIAGNOSIS — I739 Peripheral vascular disease, unspecified: Secondary | ICD-10-CM | POA: Diagnosis present

## 2019-06-06 DIAGNOSIS — U071 COVID-19: Secondary | ICD-10-CM | POA: Diagnosis not present

## 2019-06-06 DIAGNOSIS — R0602 Shortness of breath: Secondary | ICD-10-CM

## 2019-06-06 DIAGNOSIS — J189 Pneumonia, unspecified organism: Secondary | ICD-10-CM | POA: Diagnosis present

## 2019-06-06 DIAGNOSIS — J9601 Acute respiratory failure with hypoxia: Secondary | ICD-10-CM | POA: Diagnosis not present

## 2019-06-06 DIAGNOSIS — E669 Obesity, unspecified: Secondary | ICD-10-CM | POA: Diagnosis present

## 2019-06-06 DIAGNOSIS — J449 Chronic obstructive pulmonary disease, unspecified: Secondary | ICD-10-CM | POA: Diagnosis present

## 2019-06-06 DIAGNOSIS — Z85828 Personal history of other malignant neoplasm of skin: Secondary | ICD-10-CM

## 2019-06-06 DIAGNOSIS — E785 Hyperlipidemia, unspecified: Secondary | ICD-10-CM | POA: Diagnosis present

## 2019-06-06 DIAGNOSIS — K5100 Ulcerative (chronic) pancolitis without complications: Secondary | ICD-10-CM | POA: Diagnosis present

## 2019-06-06 DIAGNOSIS — E860 Dehydration: Secondary | ICD-10-CM | POA: Diagnosis present

## 2019-06-06 DIAGNOSIS — E871 Hypo-osmolality and hyponatremia: Secondary | ICD-10-CM | POA: Diagnosis present

## 2019-06-06 DIAGNOSIS — K219 Gastro-esophageal reflux disease without esophagitis: Secondary | ICD-10-CM | POA: Diagnosis present

## 2019-06-06 DIAGNOSIS — Z7902 Long term (current) use of antithrombotics/antiplatelets: Secondary | ICD-10-CM

## 2019-06-06 DIAGNOSIS — K5190 Ulcerative colitis, unspecified, without complications: Secondary | ICD-10-CM | POA: Diagnosis not present

## 2019-06-06 DIAGNOSIS — Z791 Long term (current) use of non-steroidal anti-inflammatories (NSAID): Secondary | ICD-10-CM

## 2019-06-06 DIAGNOSIS — I10 Essential (primary) hypertension: Secondary | ICD-10-CM | POA: Diagnosis present

## 2019-06-06 DIAGNOSIS — N179 Acute kidney failure, unspecified: Secondary | ICD-10-CM | POA: Diagnosis not present

## 2019-06-06 DIAGNOSIS — G4700 Insomnia, unspecified: Secondary | ICD-10-CM | POA: Diagnosis present

## 2019-06-06 DIAGNOSIS — R0789 Other chest pain: Secondary | ICD-10-CM | POA: Diagnosis not present

## 2019-06-06 DIAGNOSIS — Z6834 Body mass index (BMI) 34.0-34.9, adult: Secondary | ICD-10-CM

## 2019-06-06 DIAGNOSIS — R0902 Hypoxemia: Secondary | ICD-10-CM

## 2019-06-06 DIAGNOSIS — J069 Acute upper respiratory infection, unspecified: Secondary | ICD-10-CM

## 2019-06-06 DIAGNOSIS — J1282 Pneumonia due to coronavirus disease 2019: Secondary | ICD-10-CM | POA: Diagnosis present

## 2019-06-06 DIAGNOSIS — J9383 Other pneumothorax: Secondary | ICD-10-CM | POA: Diagnosis not present

## 2019-06-06 DIAGNOSIS — Z66 Do not resuscitate: Secondary | ICD-10-CM | POA: Diagnosis not present

## 2019-06-06 DIAGNOSIS — Z515 Encounter for palliative care: Secondary | ICD-10-CM | POA: Diagnosis not present

## 2019-06-06 DIAGNOSIS — I4891 Unspecified atrial fibrillation: Secondary | ICD-10-CM | POA: Diagnosis present

## 2019-06-06 DIAGNOSIS — Z4682 Encounter for fitting and adjustment of non-vascular catheter: Secondary | ICD-10-CM

## 2019-06-06 DIAGNOSIS — J1289 Other viral pneumonia: Secondary | ICD-10-CM | POA: Diagnosis present

## 2019-06-06 DIAGNOSIS — R079 Chest pain, unspecified: Secondary | ICD-10-CM

## 2019-06-06 DIAGNOSIS — I2699 Other pulmonary embolism without acute cor pulmonale: Secondary | ICD-10-CM | POA: Diagnosis not present

## 2019-06-06 DIAGNOSIS — I959 Hypotension, unspecified: Secondary | ICD-10-CM | POA: Diagnosis not present

## 2019-06-06 DIAGNOSIS — Z9689 Presence of other specified functional implants: Secondary | ICD-10-CM

## 2019-06-06 DIAGNOSIS — J939 Pneumothorax, unspecified: Secondary | ICD-10-CM

## 2019-06-06 DIAGNOSIS — Z79899 Other long term (current) drug therapy: Secondary | ICD-10-CM

## 2019-06-06 DIAGNOSIS — Z825 Family history of asthma and other chronic lower respiratory diseases: Secondary | ICD-10-CM

## 2019-06-06 LAB — D-DIMER, QUANTITATIVE: D-Dimer, Quant: 2.52 ug/mL-FEU — ABNORMAL HIGH (ref 0.00–0.50)

## 2019-06-06 LAB — C-REACTIVE PROTEIN: CRP: 1.1 mg/dL — ABNORMAL HIGH (ref <1.0)

## 2019-06-06 LAB — FERRITIN: Ferritin: 291 ng/mL (ref 24–336)

## 2019-06-06 NOTE — ED Triage Notes (Signed)
Pt arrived via [facility]; pt from [facility], recently released from [place] this am; pt Covid +; EMS reports new onset of AFIB per 12 lead; pt took night meds but sating 60s at [facility] and cyanotic; Pt placed on 15L NRB and sating in 80s; rec'd 146m of solumedrol and 2G Mg; luns diminished; 118/60; 75; 20; CBG 146; 85% on 15L

## 2019-06-06 NOTE — ED Provider Notes (Signed)
Attestation: Medical screening examination/treatment/procedure(s) were conducted as a shared visit with non-physician practitioner(s) and myself.  I personally evaluated the patient during the encounter.  Briefly, the patient is a 78 y.o. male with h/o COVID discharged from [place] this am, here for SOB found to be hypoxic with sats in 60%- 80% on 4LNC by EMS. Placed on NRB. EKG with new Afib  Vitals:   05/11/2019 2345 06/07/19 0033  BP: (!) 114/50 (!) 118/53  Pulse:    Resp: (!) 22 20  Temp:    SpO2:      CONSTITUTIONAL:  well-appearing, NAD NEURO:  Alert and oriented x 3, no focal deficits EYES:  pupils equal and reactive ENT/NECK:  trachea midline, no JVD CARDIO:  reg rate, reg rhythm, well-perfused PULM:  None labored breathing on NRB GI/GU:  Abdomin non-distended MSK/SPINE:  No gross deformities, no edema SKIN:  no rash, atraumatic PSYCH:  Appropriate speech and behavior    EKG Interpretation  Date/Time:  Monday June 06 2019 23:05:51 EDT Ventricular Rate:  75 PR Interval:    QRS Duration: 97 QT Interval:  395 QTC Calculation: 442 R Axis:   -32 Text Interpretation:  Sinus rhythm Abnormal R-wave progression, early transition LVH with secondary repolarization abnormality Confirmed by Addison Lank [phone]) on 05/14/2019 11:14:00 PM       Work up with leukocytosis, mild AKI and transaminitis. CXR consistent with viral PNA. No PTx.     Will need readmission for increased O2 requirement.  CRITICAL CARE Performed by: Grayce Sessions  Total critical care time: 30 minutes Critical care time was exclusive of separately billable procedures and treating other patients. Critical care was necessary to treat or prevent imminent or life-threatening deterioration. Critical care was time spent personally by me on the following activities: development of treatment plan with patient and/or surrogate as well as nursing, discussions with consultants, evaluation of patient's response  to treatment, examination of patient, obtaining history from patient or surrogate, ordering and performing treatments and interventions, ordering and review of laboratory studies, ordering and review of radiographic studies, pulse oximetry and re-evaluation of patient's condition.      Fatima Blank, MD 06/07/19 [id number]

## 2019-06-06 NOTE — Discharge Summary (Signed)
Physician Discharge Summary  [name] [name] LTJ:[id number] DOB: [birth year] DOA: 05/27/2019  PCP: Biagio Borg, MD  Admit date: 05/27/2019 Discharge date: 05/30/2019  Admitted From: Home Disposition: Home  Recommendations for Outpatient Follow-up:  1. Follow up with PCP in 1-2 weeks 2. Please obtain BMP/CBC in one week  Home Health: Yes Equipment/Devices: Oxygen  Discharge Condition: Stable CODE STATUS: Full Diet recommendation: As tolerated  Brief/Interim Summary: [name] [name] is a 78 y.o. male with medical history significant of hypertension, hyperlipidemia, COPD, peripheral vascular disease, Barrett's esophagus, and GERD; who presents with complaints of nausea, vomiting, and diarrhea over the last 2 weeks.  At baseline patient lives alone, but was reportedly exposed to family members who tested positive.  Diarrhea has been watery reporting 3 bowel movements per day on average.  Emesis and diarrhea were not noted to have any signs of blood.  He reports having no appetite with significantly decreased p.o. intake over the last 4 days.  Associated symptoms include headache, fever up to 100 F at home, chills, generalized body aches, mild intermittent nonproductive cough, and shortness of breath.  Denies having any significant abdominal pain, dysuria, palpitations, or recent falls.  At home patient is on Symbicort and albuterol inhaler. Upon into the emergency department patient was noted to be afebrile, pulse 83-1 04, respirations 17-27, blood pressures 126/58-157/95, and O2 saturations as low as 88% on room air with improvement to 96% on 2 L nasal cannula oxygen.  WBC 7.5, sodium 129, potassium 5.4, CO2 18, BUN 22, creatinine 1.28, AST 49, and total bilirubin 3.1.  Chest x-ray showed only chronic changes suggestive of emphysema.  Patient was given 2 L of normal saline IV fluids, Zofran, and 6 mg of Decadron IV.  TRH called to admit.   Patient admitted as above with acute hypoxic respiratory  failure in the setting of acute COVID-19 infection and pneumonia.  Not carry a formal diagnosis of COPD or emphysema however he does have remarkable history of smoking.  Patient has completed antiviral course of Remdesivir and steroid taper with dexamethasone as Actemra..  Fortunately patient remains markedly hypoxic with ambulation and has been a difficult wean likely in the setting of chronic lung disease and COPD, although not formally diagnosed previously.  Patient will continue oxygen with ambulation at 4 L as discussed, patient does not need oxygen at rest per patient today.  Lengthy discussion with daughter who appears to be primary medical decision maker.  Patient's other chronic comorbid conditions including hypertension, ulcerative colitis, COPD appears to be markedly well controlled.  Patient will need close follow-up with PCP in the next 1 to 2 weeks for further evaluation of symptoms, to adjust patient's oxygen and to further evaluate for underlying diagnosis of COPD formal PFTs once patient's acute respiratory illness has improved.  Otherwise stable and agreeable for discharge, daughter also agreeable this time, oxygen has been delivered to their house and patient will follow with home health PT and nursing for ongoing management of symptoms and to ensure proper usage of oxygen.    Discharge Diagnoses:  Principal Problem:   COVID-19 virus infection Active Problems:   Hyperlipidemia   Essential hypertension   GERD   Hyperkalemia   Hyponatremia   Transaminitis    Discharge Instructions  Discharge Instructions    Call MD for:  difficulty breathing, headache or visual disturbances   Complete by: As directed    Call MD for:  extreme fatigue   Complete by: As directed  Call MD for:  hives   Complete by: As directed    Call MD for:  persistant dizziness or light-headedness   Complete by: As directed    Call MD for:  persistant nausea and vomiting   Complete by: As directed     Call MD for:  severe uncontrolled pain   Complete by: As directed    Call MD for:  temperature >100.4   Complete by: As directed    Diet - low sodium heart healthy   Complete by: As directed    Increase activity slowly   Complete by: As directed      Allergies as of 05/11/2019      Reactions   Wasp Venom Anaphylaxis   Contrast Media [iodinated Diagnostic Agents] Hives   Break out in welts.  Needs 13-hr prep.   Statins Other (See Comments)   Legs pain: Atorvastatin and Lovastatin      Medication List    TAKE these medications   albuterol 108 (90 Base) MCG/ACT inhaler Commonly known as: VENTOLIN HFA Inhale 2 puffs into the lungs every 6 (six) hours as needed for wheezing or shortness of breath.   allopurinol 100 MG tablet Commonly known as: ZYLOPRIM Take 2 tablets (200 mg total) by mouth daily.   budesonide-formoterol 160-4.5 MCG/ACT inhaler Commonly known as: SYMBICORT Inhale 2 puffs into the lungs 2 (two) times daily.   clopidogrel 75 MG tablet Commonly known as: PLAVIX TAKE 1 TABLET BY MOUTH  DAILY   cyanocobalamin 1000 MCG tablet Take 1,000 mcg by mouth daily.   gabapentin 100 MG capsule Commonly known as: NEURONTIN Take 1 capsule (100 mg total) by mouth at bedtime.   lisinopril-hydrochlorothiazide 20-25 MG tablet Commonly known as: ZESTORETIC TAKE 1 TABLET BY MOUTH  DAILY   mesalamine 0.375 g 24 hr capsule Commonly known as: Apriso Take 4 capsules (1.5 g total) by mouth daily. Take 4 tablets all at one time. What changed: when to take this   pantoprazole 40 MG tablet Commonly known as: PROTONIX Take 1 tablet (40 mg total) by mouth daily.   zolpidem 10 MG tablet Commonly known as: AMBIEN TAKE 1 TABLET BY MOUTH AT BEDTIME AS NEEDED FOR SLEEP. What changed:   when to take this  additional instructions            Durable Medical Equipment  (From admission, onward)         Start     Ordered   05/27/2019 1135  For home use only DME Pulse  oximeter  Once     06/01/2019 1135   05/26/2019 1134  For home use only DME oxygen  Once    Comments: Patient only needs oxygen upon exertion/ambulation - at 4L [place]. He does NOT require oxygen at rest per most recent PT evaluation.  Question Answer Comment  Length of Need 6 Months   Mode or (Route) Nasal cannula   Liters per Minute 4   Frequency Continuous (stationary and portable oxygen unit needed)   Oxygen conserving device No   Oxygen delivery system Gas      05/31/2019 1134         Follow-up Information    [place] Follow up.   Why: A representative from [employer] will deliver oxygen tanks and concentrator to your home. Should you have questions or concerns with equipment please call [employer] at the number listed on the tank.  Contact information: [address] [place] [address] [phone]  Allergies  Allergen Reactions  . Wasp Venom Anaphylaxis  . Contrast Media [Iodinated Diagnostic Agents] Hives    Break out in welts.  Needs 13-hr prep.  . Statins Other (See Comments)    Legs pain: Atorvastatin and Lovastatin    Procedures/Studies: Ct Pelvis Wo Contrast  Result Date: 05/11/2019 CLINICAL DATA:  Chronic right groin pain. EXAM: CT PELVIS WITHOUT CONTRAST TECHNIQUE: Multidetector CT imaging of the pelvis was performed following the standard protocol without intravenous contrast. COMPARISON:  10/28/2010 FINDINGS: Lower Urinary Tract: Unremarkable urinary bladder. Bowel: Diverticulosis involving the distal descending and sigmoid colon, without evidence of diverticulitis in this area. Vascular/Lymphatic: No pathologically enlarged lymph nodes. Diffuse atherosclerotic calcification of abdominal aorta and iliac arteries. Reproductive:  No mass or other significant abnormality. Other: Postop changes in right groin from previous right femoral endarterectomy. No evidence of soft tissue mass or fluid collection. No evidence of hernia. Musculoskeletal:  No significant abnormality identified. IMPRESSION: 1. Expected postop changes from previous right femoral endarterectomy. No evidence of mass, fluid collection, or hernia. 2. Distal descending and sigmoid colon diverticulosis, without radiographic evidence of diverticulitis. Electronically Signed   By: Marlaine Hind M.D.   On: 05/11/2019 09:28   Dg Chest Port 1 View  Result Date: 06/01/2019 CLINICAL DATA:  COVID-19 EXAM: PORTABLE CHEST 1 VIEW COMPARISON:  05/28/2019 FINDINGS: No significant interval change in diffuse bilateral interstitial and heterogeneous airspace opacity, most conspicuous in the right midlung and left lung base. No new or focal airspace opacity. The heart and mediastinum are unremarkable. IMPRESSION: No significant interval change in diffuse bilateral interstitial and heterogeneous airspace opacity, most conspicuous in the right midlung and left lung base. No new or focal airspace opacity. Findings remain consistent with COVID-19 airspace disease. Electronically Signed   By: Eddie Candle M.D.   On: 06/01/2019 08:57   Dg Chest Port 1 View  Result Date: 05/28/2019 CLINICAL DATA:  Headaches, nausea, diarrhea, unable to eat x 2 weeks. Exposed to 4 family members that were diagnosed Covid positive with similar symptoms EXAM: PORTABLE CHEST 1 VIEW COMPARISON:  05/28/2019 at 1:17 a.m., and older exams. FINDINGS: Cardiac silhouette is normal in size. No mediastinal or hilar masses. Lungs show interstitial thickening with a basilar predominance, unchanged. There is relative lucency in the upper lobes suggesting emphysema. No evidence of pneumonia or pulmonary edema. No convincing pleural effusion and no pneumothorax. IMPRESSION: 1. No acute cardiopulmonary disease and no change from the earlier study. 2. Chronic interstitial thickening with the basilar predominance. Findings suggest emphysema. Electronically Signed   By: Lajean Manes M.D.   On: 05/28/2019 05:43   Dg Chest Port 1 View  Result  Date: 05/28/2019 CLINICAL DATA:  Weakness.  Fever. EXAM: PORTABLE CHEST 1 VIEW COMPARISON:  05/24/2015 FINDINGS: There are chronic appearing prominent interstitial lung markings primarily at the lung bases bilaterally. There is no pneumothorax. There is no focal infiltrate. The heart size is stable from prior studies. There is no acute osseous abnormality. IMPRESSION: No active disease. Electronically Signed   By: Constance Holster M.D.   On: 05/28/2019 01:47   Dg Inject Diag/thera/inc Needle/cath/plc Epi/lumb/sac W/img  Result Date: 05/19/2019 CLINICAL DATA:  Lumbosacral spondylosis without myelopathy with radiculopathy. Chronic low back and bilateral leg pain. FLUOROSCOPY TIME:  Radiation Exposure Index (as provided by the fluoroscopic device): 8.0 mGy Fluoroscopy Time:  20 seconds Number of Acquired Images:  0 PROCEDURE: The procedure, risks, benefits, and alternatives were explained to the patient. Questions regarding the procedure were encouraged and  answered. The patient understands and consents to the procedure. LUMBAR EPIDURAL INJECTION: An interlaminar approach was performed on the right at L4-L5. The overlying skin was cleansed and anesthetized. A 3.5 inch 20 gauge epidural needle was advanced using loss-of-resistance technique. DIAGNOSTIC EPIDURAL INJECTION: Injection of Isovue-M 200 shows a good epidural pattern with spread above and below the level of needle placement, primarily on the right. No vascular opacification is seen. THERAPEUTIC EPIDURAL INJECTION: 120 mg of Depo-Medrol were instilled. The procedure was well-tolerated, and the patient was discharged thirty minutes following the injection in good condition. COMPLICATIONS: None immediate. IMPRESSION: Technically successful interlaminar epidural injection on the right at L4-L5. Electronically Signed   By: Titus Dubin M.D.   On: 05/19/2019 12:42   Vas US Carotid  Result Date: 05/12/2019 Carotid Arterial Duplex Study Indications:        Bilateral carotid artery stenosis. Patient has a history of                    double vision. Today he c/o headache at the right occipital                    area extending to the right shoulder for several weeks. With                    the headache, he states he also has been experiencing                    intermittent presyncope episodes. He denies any other                    cerebrovascular symptoms. Risk Factors:      Hypertension, hyperlipidemia, past history of smoking, PAD. Comparison Study:  In 06/2015, a carotid duplex showed velocities of 93/23 cm/s                    in the RICA suggesting 1-39% stenosis and velocities of                    101/75 cm/s in the LICA suggesting 10-25% stenosis. Performing Technologist: Sharlett Iles RVT  Examination Guidelines: A complete evaluation includes B-mode imaging, spectral Doppler, color Doppler, and power Doppler as needed of all accessible portions of each vessel. Bilateral testing is considered an integral part of a complete examination. Limited examinations for reoccurring indications may be performed as noted.  Right Carotid Findings: +----------+--------+--------+--------+------------------+------------------+           PSV cm/sEDV cm/sStenosisPlaque DescriptionComments           +----------+--------+--------+--------+------------------+------------------+ CCA Prox  153     8               heterogenous                         +----------+--------+--------+--------+------------------+------------------+ CCA Distal67      9               heterogenous      intimal thickening +----------+--------+--------+--------+------------------+------------------+ ICA Prox  62      11              heterogenous                         +----------+--------+--------+--------+------------------+------------------+ ICA Mid   79      17      1-39%  heterogenous                          +----------+--------+--------+--------+------------------+------------------+ ICA Distal73      17                                                   +----------+--------+--------+--------+------------------+------------------+ ECA       134     8               heterogenous                         +----------+--------+--------+--------+------------------+------------------+ +----------+--------+-------+----------------+-------------------+           PSV cm/sEDV cmsDescribe        Arm Pressure (mmHG) +----------+--------+-------+----------------+-------------------+ Subclavian230            Multiphasic, [id number]                 +----------+--------+-------+----------------+-------------------+ +---------+--------+--+--------+--+---------+ VertebralPSV cm/s68EDV cm/s15Antegrade +---------+--------+--+--------+--+---------+  Left Carotid Findings: +----------+--------+--------+--------+------------------+---------------------+           PSV cm/sEDV cm/sStenosisPlaque DescriptionComments              +----------+--------+--------+--------+------------------+---------------------+ CCA Prox  144     12                                                      +----------+--------+--------+--------+------------------+---------------------+ CCA Mid   84      5               heterogenous      intimal thickening    +----------+--------+--------+--------+------------------+---------------------+ CCA Distal68      7                                 intimal thickening    +----------+--------+--------+--------+------------------+---------------------+ ICA Prox  197     26      40-59%  heterogenous      based on peaks                                                            systolic velocities                                                       and plaque formation  +----------+--------+--------+--------+------------------+---------------------+ ICA Mid    116     20              heterogenous                            +----------+--------+--------+--------+------------------+---------------------+ ICA Distal154     25                                                      +----------+--------+--------+--------+------------------+---------------------+  ECA       173     9               heterogenous                            +----------+--------+--------+--------+------------------+---------------------+ +----------+--------+--------+----------------+-------------------+           PSV cm/sEDV cm/sDescribe        Arm Pressure (mmHG) +----------+--------+--------+----------------+-------------------+ Subclavian208             Multiphasic, BTC481                 +----------+--------+--------+----------------+-------------------+ +---------+--------+--+--------+-+--------------+ VertebralPSV cm/s65EDV cm/s0High resistant +---------+--------+--+--------+-+--------------+  Summary: Right Carotid: Velocities in the right ICA are consistent with a 1-39% stenosis.                The RICA velocities remain within normal range and have decreased                compared to the prior exam. Left Carotid: Velocities in the left ICA are consistent with a 40-59% stenosis.               Non-hemodynamically significant plaque <50% noted in the CCA. The               LICA velocities are elevated and have decreased compared to the               prior exam. Vertebrals:  Right vertebral artery demonstrates antegrade flow. Left vertebral              artery demonstrates high resistant flow. Probable distal occlusion              in the left vertebral artery. Subclavians: Normal flow hemodynamics were seen in bilateral subclavian              arteries. *See table(s) above for measurements and observations.  Electronically signed by Larae Grooms MD on 05/12/2019 at 8:20:24 AM.    Final      Subjective: No acute issues or events overnight, tolerating  p.o. well, ambulating with mild dyspnea but markedly improved from previous, declines chest pain, nausea, vomiting, diarrhea, constipation, headache, fever, chills.   Discharge Exam: Vitals:   06/02/2019 0757 05/28/2019 1113  BP: (!) 105/56   Pulse: 73   Resp:    Temp: 98.1 F (36.7 C)   SpO2: 95% (!) 79%   Vitals:   05/10/2019 0328 05/15/2019 0700 05/14/2019 0757 05/30/2019 1113  BP: 115/70  (!) 105/56   Pulse:   73   Resp:      Temp: 98 F (36.7 C)  98.1 F (36.7 C)   TempSrc: Oral  Oral   SpO2:  (!) 79% 95% (!) 79%  Weight:      Height:        General:  Pleasantly resting in bed, No acute distress. HEENT:  Normocephalic atraumatic.  Sclerae nonicteric, noninjected.  Extraocular movements intact bilaterally. Neck:  Without mass or deformity.  Trachea is midline. Lungs:  Clear to auscultate bilaterally without rhonchi, wheeze, or rales. Heart:  Regular rate and rhythm.  Without murmurs, rubs, or gallops. Abdomen:  Soft, nontender, nondistended.  Without guarding or rebound. Extremities: Without cyanosis, clubbing, edema, or obvious deformity. Vascular:  Dorsalis pedis and posterior tibial pulses palpable bilaterally. Skin:  Warm and dry, no erythema, no ulcerations.   The results of significant diagnostics from this hospitalization (including imaging, microbiology, ancillary  and laboratory) are listed below for reference.     Microbiology: Recent Results (from the past 240 hour(s))  SARS Coronavirus 2 [facility] order, Performed in [facility] hospital lab) Nasopharyngeal Nasopharyngeal Swab     Status: Abnormal   Collection Time: 05/28/19  4:38 AM   Specimen: Nasopharyngeal Swab  Result Value Ref Range Status   SARS Coronavirus 2 POSITIVE (A) NEGATIVE Final    Comment: RESULT CALLED TO, READ BACK BY AND VERIFIED WITH: RN M BROOKS @ [phone] 05/28/19 BY S GEZAHEGN (NOTE) If result is NEGATIVE SARS-CoV-2 target nucleic acids are NOT DETECTED. The SARS-CoV-2 RNA is generally  detectable in upper and lower  respiratory specimens during the acute phase of infection. The lowest  concentration of SARS-CoV-2 viral copies this assay can detect is 250  copies / mL. A negative result does not preclude SARS-CoV-2 infection  and should not be used as the sole basis for treatment or other  patient management decisions.  A negative result may occur with  improper specimen collection / handling, submission of specimen other  than nasopharyngeal swab, presence of viral mutation(s) within the  areas targeted by this assay, and inadequate number of viral copies  (<250 copies / mL). A negative result must be combined with clinical  observations, patient history, and epidemiological information. If result is POSITIVE SARS-CoV-2 target nucleic acids are DETECTE D. The SARS-CoV-2 RNA is generally detectable in upper and lower  respiratory specimens during the acute phase of infection.  Positive  results are indicative of active infection with SARS-CoV-2.  Clinical  correlation with patient history and other diagnostic information is  necessary to determine patient infection status.  Positive results do  not rule out bacterial infection or co-infection with other viruses. If result is PRESUMPTIVE POSTIVE SARS-CoV-2 nucleic acids MAY BE PRESENT.   A presumptive positive result was obtained on the submitted specimen  and confirmed on repeat testing.  While 2019 novel coronavirus  (SARS-CoV-2) nucleic acids may be present in the submitted sample  additional confirmatory testing may be necessary for epidemiological  and / or clinical management purposes  to differentiate between  SARS-CoV-2 and other Sarbecovirus currently known to infect humans.  If clinically indicated additional testing with an alternate test  methodology (LAB745 3) is advised. The SARS-CoV-2 RNA is generally  detectable in upper and lower respiratory specimens during the acute  phase of infection. The  expected result is Negative. Fact Sheet for Patients:  StrictlyIdeas.no Fact Sheet for Healthcare Providers: BankingDealers.co.za This test is not yet approved or cleared by the [place] FDA and has been authorized for detection and/or diagnosis of SARS-CoV-2 by FDA under an Emergency Use Authorization (EUA).  This EUA will remain in effect (meaning this test can be used) for the duration of the COVID-19 declaration under Section 564(b)(1) of the Act, 21 U.S.C. section 360bbb-3(b)(1), unless the authorization is terminated or revoked sooner. Performed at [facility], [place] [address] [place], [place] [address]      Labs: BNP (last 3 results) Recent Labs    05/28/19 0859  BNP 40.9   Basic Metabolic Panel: Recent Labs  Lab 05/31/19 0205 06/01/19 0220 06/02/19 0145  NA 136 136 134*  K 3.7 3.7 3.9  CL 105 105 103  CO2 20* 21* 20*  GLUCOSE 134* 106* 119*  BUN 24* 30* 31*  CREATININE 0.81 0.92 0.93  CALCIUM 8.3* 8.1* 8.1*  MG 1.8 1.9 2.0   Liver Function Tests: Recent Labs  Lab  05/31/19 0205 06/01/19 0220 06/02/19 0145  AST 34 31 27  ALT 28 30 29   ALKPHOS 54 60 60  BILITOT 0.8 0.9 0.9  PROT 5.8* 5.8* 5.6*  ALBUMIN 2.8* 2.7* 2.6*   No results for input(s): LIPASE, AMYLASE in the last 168 hours. No results for input(s): AMMONIA in the last 168 hours. CBC: Recent Labs  Lab 05/31/19 0205 06/01/19 0220 06/02/19 0145  WBC 9.7 9.3 9.1  NEUTROABS 8.8* 8.1* 8.0*  HGB 14.1 13.8 13.0  HCT 41.4 40.2 38.5*  MCV 82.8 82.4 83.0  PLT 236 258 256   D-Dimer Recent Labs    06/05/19 0338 06/02/2019 0213  DDIMER 2.37* 2.52*   Anemia work up Recent Labs    06/05/19 0338 06/08/2019 0213  FERRITIN 282 291   Urinalysis    Component Value Date/Time   COLORURINE YELLOW 02/07/2019 [place] 02/07/2019 0954   LABSPEC 1.020 02/07/2019 0954   PHURINE 6.0 02/07/2019 [place]  02/07/2019 0954   HGBUR NEGATIVE 02/07/2019 0954   BILIRUBINUR NEGATIVE 02/07/2019 [place] 02/07/2019 0954   PROTEINUR NEGATIVE 07/12/2018 1506   UROBILINOGEN 0.2 02/07/2019 0954   NITRITE NEGATIVE 02/07/2019 [place] 02/07/2019 0954   Microbiology Recent Results (from the past 240 hour(s))  SARS Coronavirus 2 [facility] order, Performed in [facility] hospital lab) Nasopharyngeal Nasopharyngeal Swab     Status: Abnormal   Collection Time: 05/28/19  4:38 AM   Specimen: Nasopharyngeal Swab  Result Value Ref Range Status   SARS Coronavirus 2 POSITIVE (A) NEGATIVE Final    Comment: RESULT CALLED TO, READ BACK BY AND VERIFIED WITH: RN M BROOKS @ [phone] 05/28/19 BY S GEZAHEGN (NOTE) If result is NEGATIVE SARS-CoV-2 target nucleic acids are NOT DETECTED. The SARS-CoV-2 RNA is generally detectable in upper and lower  respiratory specimens during the acute phase of infection. The lowest  concentration of SARS-CoV-2 viral copies this assay can detect is 250  copies / mL. A negative result does not preclude SARS-CoV-2 infection  and should not be used as the sole basis for treatment or other  patient management decisions.  A negative result may occur with  improper specimen collection / handling, submission of specimen other  than nasopharyngeal swab, presence of viral mutation(s) within the  areas targeted by this assay, and inadequate number of viral copies  (<250 copies / mL). A negative result must be combined with clinical  observations, patient history, and epidemiological information. If result is POSITIVE SARS-CoV-2 target nucleic acids are DETECTE D. The SARS-CoV-2 RNA is generally detectable in upper and lower  respiratory specimens during the acute phase of infection.  Positive  results are indicative of active infection with SARS-CoV-2.  Clinical  correlation with patient history and other diagnostic information is  necessary to determine  patient infection status.  Positive results do  not rule out bacterial infection or co-infection with other viruses. If result is PRESUMPTIVE POSTIVE SARS-CoV-2 nucleic acids MAY BE PRESENT.   A presumptive positive result was obtained on the submitted specimen  and confirmed on repeat testing.  While 2019 novel coronavirus  (SARS-CoV-2) nucleic acids may be present in the submitted sample  additional confirmatory testing may be necessary for epidemiological  and / or clinical management purposes  to differentiate between  SARS-CoV-2 and other Sarbecovirus currently known to infect humans.  If clinically indicated additional testing with an alternate test  methodology (LAB745 3) is advised. The SARS-CoV-2  RNA is generally  detectable in upper and lower respiratory specimens during the acute  phase of infection. The expected result is Negative. Fact Sheet for Patients:  StrictlyIdeas.no Fact Sheet for Healthcare Providers: BankingDealers.co.za This test is not yet approved or cleared by the [place] FDA and has been authorized for detection and/or diagnosis of SARS-CoV-2 by FDA under an Emergency Use Authorization (EUA).  This EUA will remain in effect (meaning this test can be used) for the duration of the COVID-19 declaration under Section 564(b)(1) of the Act, 21 U.S.C. section 360bbb-3(b)(1), unless the authorization is terminated or revoked sooner. Performed at [facility], [place] [address] [place], [place] [address]      Time coordinating discharge: Over 30 minutes  SIGNED:   Little Ishikawa, DO [facility] 05/19/2019, 11:35 AM Pager   If 7PM-7AM, please contact night-coverage [email or web address] Password [email or web address]

## 2019-06-06 NOTE — TOC Transition Note (Signed)
Transition of Care [facility]) - CM/SW Discharge Note   Patient Details  Name: [name] [name] MRN: [id number] Date of Birth: 08-30-1941  Transition of Care [facility]) CM/SW Contact:  Ninfa Meeker, RN Phone Number: [phone] (working remotely) 06/05/2019, 10:35 AM   Clinical Narrative:   78 yr old gentleman admitted and treated for COVID 19. Thankfully patient is improving and will discharge home. Case manager spoke with him via telephone to discuss need for oxygen at discharge. CHoice for DME agency offered, referral called to [facility]. Patient asked CM to contact his daughter, [name] [name] [phone] to let her know she needs to go to his home for oxygen deliver. Case Manager confirmed with [facility]. Patient's address confirmed. She will pick her dad up after DME delivered to the home.    Final next level of care: Home/Self Care Barriers to Discharge: No Barriers Identified   Patient Goals and CMS Choice Patient states their goals for this hospitalization and ongoing recovery are:: continue to get better CMS Medicare.gov Compare Post Acute Care list provided to:: Patient Choice offered to / list presented to : Patient  Discharge Placement                       Discharge Plan and Services   Discharge Planning Services: CM Consult Post Acute Care Choice: Durable Medical Equipment          DME Arranged: Oxygen   Date DME Agency Contacted: 05/23/2019 Time DME Agency Contacted: [id number] Representative spoke with at DME Agency: [facility] HH Arranged: NA [place] Agency: NA        Social Determinants of Health ([place] Guerra) Interventions     Readmission Risk Interventions Readmission Risk Prevention Plan 07/20/2018  Post Dischage Appt Complete  Medication Screening Complete  Transportation Screening Complete  PCP follow-up Complete  Some recent data might be hidden

## 2019-06-06 NOTE — TOC Transition Note (Signed)
Transition of Care [facility]) - CM/SW Discharge Note   Patient Details  Name: [name] [name] MRN: [id number] Date of Birth: March 17, 1941  Transition of Care [facility]) CM/SW Contact:  Ninfa Meeker, RN Phone Number: [phone] (working remotely) 05/27/2019, 11:12 AM   Clinical Narrative:  Case manager was contacted by therapist concerning patient need for [facility] therapy. Case Manager contacted patient's daughter, [name] [phone], who handles all of his medical affairs, discussed need for [facility] therapy and offered choice for [facility] agency. Referral called to Adela Lank, [facility] liaison. Will let Bayda know that appointments need to be arranged with [name] first.    Final next level of care: [place] Barriers to Discharge: No Barriers Identified   Patient Goals and CMS Choice Patient states their goals for this hospitalization and ongoing recovery are:: continue to get better CMS Medicare.gov Compare Post Acute Care list provided to:: Patient Choice offered to / list presented to : Patient  Discharge Placement                       Discharge Plan and Services   Discharge Planning Services: CM Consult Post Acute Care Choice: Home Health          DME Arranged: Oxygen   Date DME Agency Contacted: 05/21/2019 Time DME Agency Contacted: [id number] Representative spoke with at DME Agency: [facility] HH Arranged: PT, OT [facility] Agency: [place] Date [place]: 05/11/2019 Time [place]: 1111 Representative spoke with at [place]: [place] ([place]) Interventions     Readmission Risk Interventions Readmission Risk Prevention Plan 07/20/2018  Post Dischage Appt Complete  Medication Screening Complete  Transportation Screening Complete  PCP follow-up Complete  Some recent data might be hidden

## 2019-06-06 NOTE — Progress Notes (Signed)
Physical Therapy Treatment Patient Details Name: [name] [name] MRN: [id number] DOB: 02/01/1941 Today's Date: 06/02/2019    History of Present Illness  78 y.o. M with COPD not on home O2, PVD s/p right fem endarterectomy and stent, Ulcerative colitis, Barrett's esophagus, GERD,HLD, HyerK, HypoNa and HTN who presented (9/18) with N/V/D for several weeks.  In the ER, he was SOB and CXR without opacities.    PT Comments    Pt continues to desaturate on RA and on 4L O2 [place] with mobility.  Lowest observed during gait was 79% requiring a standing rest break to recover into the 80s to continue.  PT educated pt on not needing O2 at rest, but needing 2-4 L during mobility depending on what he is doing. He continued to state, "just tell my daughter because I cannot keep up with all of this".  PT wrote down education on his HEP so pt had something to reference at home. HEP reviewed and O2 checked via nelcor forehead probe (most accurate).  PT will continue to follow acutely for safe mobility progression  Follow Up Recommendations  Home health PT;Other (comment)(for education and exercise progression)     Equipment Recommendations  Other (comment)(home O2)    Recommendations for Other Services   NA     Precautions / Restrictions Precautions Precautions: Other (comment) Precaution Comments: Desats with activity    Mobility  Bed Mobility               General bed mobility comments: Pt was OOB in the recliner chair.   Transfers Overall transfer level: Needs assistance Equipment used: None Transfers: Sit to/from Stand Sit to Stand: Modified independent (Device/Increase time)            Ambulation/Gait Ambulation/Gait assistance: Supervision Gait Distance (Feet): 85 Feet Assistive device: None Gait Pattern/deviations: Step-through pattern Gait velocity: decreased Gait velocity interpretation: <1.8 ft/sec, indicate of risk for recurrent falls            Balance Overall  balance assessment: Mild deficits observed, not formally tested                                          Cognition Arousal/Alertness: Awake/alert Behavior During Therapy: WFL for tasks assessed/performed Overall Cognitive Status: History of cognitive impairments - at baseline                                 General Comments: decreased STM, likes things written down      Exercises General Exercises - Upper Extremity Shoulder Horizontal ABduction: AROM;Both;10 reps;Theraband Theraband Level (Shoulder Horizontal Abduction): Level 3 (Green) Elbow Flexion: AROM;Both;10 reps;Theraband Theraband Level (Elbow Flexion): Level 3 (Green) Elbow Extension: AROM;Both;10 reps;Theraband Theraband Level (Elbow Extension): Level 3 (Green) General Exercises - Lower Extremity Hip ABduction/ADduction: AROM;Both;10 reps;Supine Hip Flexion/Marching: AROM;Both;10 reps;Seated    General Comments General comments (skin integrity, edema, etc.): O2 down to 79 requiring standing rest break during gait. on 4 L O2 [place].       Pertinent Vitals/Pain Pain Assessment: No/denies pain           PT Goals (current goals can now be found in the care plan section) Acute Rehab PT Goals Patient Stated Goal: go home and be able to work around house Progress towards PT goals: Progressing toward goals    Frequency  Min 3X/week      PT Plan Current plan remains appropriate       AM-PAC PT "6 Clicks" Mobility   Outcome Measure  Help needed turning from your back to your side while in a flat bed without using bedrails?: None Help needed moving from lying on your back to sitting on the side of a flat bed without using bedrails?: None Help needed moving to and from a bed to a chair (including a wheelchair)?: None Help needed standing up from a chair using your arms (e.g., wheelchair or bedside chair)?: None Help needed to walk in hospital room?: None Help needed climbing 3-5  steps with a railing? : A Little 6 Click Score: 23    End of Session Equipment Utilized During Treatment: Oxygen(2-4 L) Activity Tolerance: Other (comment)(limited by SOB) Patient left: in chair;with call bell/phone within reach   PT Visit Diagnosis: Other abnormalities of gait and mobility (R26.89)     Time: 8469-6295 PT Time Calculation (min) (ACUTE ONLY): 58 min  Charges:  $Gait Training: 23-37 mins $Therapeutic Exercise: 8-22 mins $Self Care/Home Management: 8-22             Princesa Willig B. Laurene Melendrez, PT, DPT  Acute Rehabilitation [phone] pager [phone] office  @ [place] [place]: [phone]            05/17/2019, 11:08 AM

## 2019-06-06 NOTE — ED Provider Notes (Addendum)
[facility] EMERGENCY DEPARTMENT Provider Note   CSN: [id number] Arrival date & time: 06/01/2019  2302     History   Chief Complaint Chief Complaint  Patient presents with  . Shortness of Breath    HPI [name] [name] is a 78 y.o. male with past medical history significant for COPD, HTN, and HLD who was recently admitted for acute hypoxic respiratory failure in the setting of an acute COVID-19 infection and pneumonia.  He arrives to the ED via EMS from home after having respiratory difficulty at home with oxygen saturation in the 60s.  EMS reports new onset A. fib per twelve-lead EKG, but appears to be normal sinus rhythm on arrival.  Patient reportedly just been discharged home this morning from [facility] and was alternating between 2 and 4 L supplemental oxygen.  He stood up through the bathroom and felt "woozy" and weak.  He denies falling.  His daughter who was with him at the house noticed that he was turning a bluish discoloration and is O2 saturation was in the 60s which prompted her to call 911.  He denies feeling any chest palpitations, signficant chest pain, cough, abdominal pain, nausea, or vomiting.  He did admit to some mild chest discomfort, but with deep inspiration when attempting to catch his breath.      HPI  Past Medical History:  Diagnosis Date  . Abdominal pain, unspecified site 10/16/2009  . Anal fissure 04/24/2008  . Arthritis   . B12 deficiency 10/11/2014  . BACK PAIN 01/10/2009  . Barrett's esophagus 1997  . BARRETTS ESOPHAGUS 08/16/2007  . BREAST HYPERTROPHY 02/16/2009  . CARBUNCLE, NECK 02/14/2008  . Cataract   . DIVERTICULOSIS, COLON 06/28/2007  . Full dentures   . GERD 06/28/2007  . GLUCOSE INTOLERANCE 08/15/2008  . Headache(784.0) 11/13/2009  . HERNIA, UMBILICAL W/OBSTRUCTION W/O GANGRENE 06/28/2007  . HTN (hypertension) 07/12/2012  . HYPERLIPIDEMIA 06/28/2007  . HYPERTENSION 06/28/2007  . Impaired glucose tolerance 04/13/2011  .  INSOMNIA-SLEEP DISORDER-UNSPEC 10/10/2008  . LEG PAIN, BILATERAL 02/16/2009  . LOW BACK PAIN 06/28/2007  . MASTITIS 01/10/2009  . NECK PAIN 05/31/2008  . NEPHROLITHIASIS, HX OF 06/28/2007  . PROCTOSIGMOIDITIS, ULCERATIVE 1997  . PVD (peripheral vascular disease) with claudication, lifestyle limiting, ABI rt. 0.67, lt. 0.73 07/12/2012  . S/P angioplasty with stent, after DB athrectomy to Rt. ext. iliac and Rt. SFA 07/12/2012  . Skin cancer    right hand  . Spinal stenosis at L4-L5 level   . Stenosis of left carotid artery, mod to severe by dopplers 09/29/2012  . TESTICULAR PAIN, RIGHT 10/25/2010  . VITAMIN D DEFICIENCY 11/13/2009    Patient Active Problem List   Diagnosis Date Noted  . Acute respiratory failure with hypoxia ([place]) 06/07/2019  . COVID-19 virus infection 05/28/2019  . Hyperkalemia 05/28/2019  . Hyponatremia 05/28/2019  . Transaminitis 05/28/2019  . Right inguinal hernia 04/07/2019  . Dyspnea 01/28/2019  . Acute sinusitis 11/18/2018  . Wheezing 11/18/2018  . PAD (peripheral artery disease) ([place]) 07/19/2018  . PVD (peripheral vascular disease) ([place]) 05/04/2018  . Spinal stenosis of lumbar region 11/05/2016  . Spinal stenosis at L4-L5 level 11/05/2016  . Mastalgia 04/10/2016  . B12 deficiency 10/11/2014  . Bilateral leg pain 10/10/2014  . Double vision with both eyes open 10/13/2013  . Stenosis of left carotid artery, mod to severe by dopplers 09/29/2012  . S/P angioplasty with stent, and [place]bo[place] athrectomy mid lt. SFA for life style limiting claudication 09/28/12 09/29/2012  .  PVD (peripheral vascular disease) with claudication, lifestyle limiting, ABI rt. 0.67, lt. 0.73 07/12/2012  . Thromboembolism, distal  Rt SFA  treated with aspiration thrombectomy, 07/12/12 07/12/2012  . S/P insertion of iliac artery stent, external iliac and RT SFA  with DB athrectomy 07/12/12 07/12/2012  . Impaired glucose tolerance 04/13/2011  . Encounter for well adult exam with abnormal findings  04/13/2011  . Chronic universal ulcerative colitis ([place]) 11/14/2009  . VITAMIN D DEFICIENCY 11/13/2009  . Depression 10/16/2009  . SHOULDER PAIN, LEFT 10/16/2009  . HIP PAIN, RIGHT 10/16/2009  . Gynecomastia 02/16/2009  . BACK PAIN 01/10/2009  . INSOMNIA-SLEEP DISORDER-UNSPEC 10/10/2008  . Chest pain 10/10/2008  . NECK PAIN 05/31/2008  . Hx of ulcerative colitis 04/24/2008  . Anal fissure 04/24/2008  . Personal history of colonic polyps 04/20/2008  . CARBUNCLE, NECK 02/14/2008  . BARRETTS ESOPHAGUS 08/16/2007  . Hyperlipidemia 06/28/2007  . Essential hypertension 06/28/2007  . GERD 06/28/2007  . DIVERTICULOSIS, COLON 06/28/2007  . Lower back pain 06/28/2007  . NEPHROLITHIASIS, HX OF 06/28/2007    Past Surgical History:  Procedure Laterality Date  . ABDOMINAL AORTOGRAM N/A 05/13/2018   Procedure: ABDOMINAL AORTOGRAM;  Surgeon: [name], MD;  Location: [facility];  Service: Cardiovascular;  Laterality: N/A;  . anal fissure surgury     pt unsure of this  . ATHERECTOMY  09/28/2012  . ATHERECTOMY N/A 07/12/2012   Procedure: ATHERECTOMY;  Surgeon: [name], MD;  Location: [facility] CATH LAB;  Service: Cardiovascular;  Laterality: N/A;  . ATHERECTOMY N/A 09/28/2012   Procedure: ATHERECTOMY;  Surgeon: [name], MD;  Location: [facility] CATH LAB;  Service: Cardiovascular;  Laterality: N/A;  . bilateral inguinal hernia with mesh and umbilical hernia repairs  09/2007  . CATARACT EXTRACTION Bilateral    bilateral  . COLONOSCOPY    . ENDARTERECTOMY FEMORAL Right 07/19/2018   Procedure: ENDARTERECTOMY RIGHT FEMORAL ARTERY;  Surgeon: [name], MD;  Location: [facility];  Service: Vascular;  Laterality: Right;  . GROIN DISSECTION Right 07/20/2013   Procedure: GROIN EXPLORATION, EXCISIONAL BIOPSY RIGHT INGUINAL LYMPH NODES;  Surgeon: Harl Bowie, MD;  Location: [facility];  Service: General;  Laterality: Right;  . HERNIA REPAIR    . left knee  arthroscopy    . LOWER EXTREMITY ANGIOGRAPHY Bilateral 05/13/2018   Procedure: Lower Extremity Angiography;  Surgeon: [name], MD;  Location: [facility];  Service: Cardiovascular;  Laterality: Bilateral;  . LUMBAR LAMINECTOMY/DECOMPRESSION MICRODISCECTOMY N/A 11/05/2016   Procedure: Microlumbar decompression L2-3, L3-4, L4-5, lateral mass fusion L4-5;  Surgeon: Susa Day, MD;  Location: [facility];  Service: Orthopedics;  Laterality: N/A;  . PATCH ANGIOPLASTY Right 07/19/2018   Procedure: RIGHT FEMORAL PATCH ANGIOPLASTY WITH XENOSURE BIOLOGIC PATCH;  Surgeon: [name], MD;  Location: [facility] [place];  Service: Vascular;  Laterality: Right;  . PERCUTANEOUS STENT INTERVENTION  09/28/2012   Procedure: PERCUTANEOUS STENT INTERVENTION;  Surgeon: [name], MD;  Location[facility] CATH LAB;  Service: Cardiovascular;;  . PERIPHERAL VASCULAR INTERVENTION  05/13/2018   Procedure: PERIPHERAL VASCULAR INTERVENTION;  Surgeon: [name], MD;  Location: [facility];  Service: Cardiovascular;;  Ext. Iliac  . UPPER GI ENDOSCOPY          Home Medications    Prior to Admission medications   Medication Sig Start Date End Date Taking? Authorizing Provider  albuterol (VENTOLIN HFA) 108 (90 Base) MCG/ACT inhaler Inhale 2 puffs into the lungs every 6 (  six) hours as needed for wheezing or shortness of breath. 01/28/19   Biagio Borg, MD  allopurinol (ZYLOPRIM) 100 MG tablet Take 2 tablets (200 mg total) by mouth daily. 05/12/18   Lyndal Pulley, DO  budesonide-formoterol (SYMBICORT) 160-4.5 MCG/ACT inhaler Inhale 2 puffs into the lungs 2 (two) times daily. 01/28/19   Biagio Borg, MD  clopidogrel (PLAVIX) 75 MG tablet TAKE 1 TABLET BY MOUTH  DAILY Patient taking differently: Take 75 mg by mouth daily.  11/25/18   Biagio Borg, MD  cyanocobalamin 1000 MCG tablet Take 1,000 mcg by mouth daily.     [provider]  gabapentin (NEURONTIN) 100 MG capsule Take 1 capsule (100  mg total) by mouth at bedtime. 04/29/19   Lyndal Pulley, DO  lisinopril-hydrochlorothiazide (PRINZIDE,ZESTORETIC) 20-25 MG tablet TAKE 1 TABLET BY MOUTH  DAILY 11/25/18   Biagio Borg, MD  mesalamine (APRISO) 0.375 g 24 hr capsule Take 4 capsules (1.5 g total) by mouth daily. Take 4 tablets all at one time. Patient taking differently: Take 1.5 g by mouth at bedtime. Take 4 tablets all at one time. 02/08/19   Ladene Artist, MD  pantoprazole (PROTONIX) 40 MG tablet Take 1 tablet (40 mg total) by mouth daily. 02/08/19   Ladene Artist, MD  zolpidem (AMBIEN) 10 MG tablet TAKE 1 TABLET BY MOUTH AT BEDTIME AS NEEDED FOR SLEEP. Patient taking differently: Take 10 mg by mouth at bedtime.  03/02/19   Biagio Borg, MD    Family History Family History  Problem Relation Age of Onset  . COPD Mother   . Cerebral aneurysm Mother   . COPD Father   . Colon cancer Neg Hx   . Esophageal cancer Neg Hx   . Rectal cancer Neg Hx   . Stomach cancer Neg Hx   . Colon polyps Neg Hx     Social History Social History   Tobacco Use  . Smoking status: Former Smoker    Quit date: 09/08/1972    Years since quitting: 46.7  . Smokeless tobacco: Former Systems developer    Types: Chew  Substance Use Topics  . Alcohol use: Not Currently    Alcohol/week: 5.0 standard drinks    Types: 5 Cans of beer per week  . Drug use: No     Allergies   Wasp venom, Contrast media [iodinated diagnostic agents], and Statins   Review of Systems Review of Systems  All other systems reviewed and are negative.    Physical Exam Updated Vital Signs BP (!) 118/53   Pulse 68   Temp (!) 97.4 F (36.3 C) (Oral)   Resp 20   SpO2 (!) 89%   Physical Exam Vitals signs and nursing note reviewed. Exam conducted with a chaperone present.  Constitutional:      Appearance: Normal appearance.  HENT:     Head: Normocephalic and atraumatic.  Eyes:     General: No scleral icterus.    Conjunctiva/sclera: Conjunctivae normal.  Neck:      Musculoskeletal: Normal range of motion.  Cardiovascular:     Rate and Rhythm: Normal rate and regular rhythm.     Pulses: Normal pulses.     Heart sounds: Normal heart sounds.  Pulmonary:     Comments: Mildly increased respiratory effort, breath sounds intact bilaterally.  Mildly tachypneic.  No accessory muscle use.  No acute distress.  Able to have a conversation without stopping to catch his breath. Abdominal:     General:  Abdomen is flat. There is no distension.     Palpations: Abdomen is soft.     Tenderness: There is no abdominal tenderness. There is no guarding.  Skin:    General: Skin is dry.  Neurological:     Mental Status: He is alert.     GCS: GCS eye subscore is 4. GCS verbal subscore is 5. GCS motor subscore is 6.  Psychiatric:        Mood and Affect: Mood normal.        Behavior: Behavior normal.        Thought Content: Thought content normal.      ED Treatments / Results  Labs (all labs ordered are listed, but only abnormal results are displayed) Labs Reviewed  CBC WITH DIFFERENTIAL/PLATELET - Abnormal; Notable for the following components:      Result Value   WBC 16.5 (*)    RDW 16.3 (*)    Platelets 444 (*)    All other components within normal limits  COMPREHENSIVE METABOLIC PANEL - Abnormal; Notable for the following components:   Sodium 129 (*)    CO2 19 (*)    Glucose, Bld 126 (*)    BUN 49 (*)    Creatinine, Ser 1.32 (*)    Calcium 8.5 (*)    Total Protein 6.0 (*)    Albumin 2.9 (*)    AST 71 (*)    ALT 116 (*)    GFR calc non Af Amer 52 (*)    GFR calc Af Amer 60 (*)    All other components within normal limits  D-DIMER, QUANTITATIVE (NOT AT [facility])  C-REACTIVE PROTEIN  PROCALCITONIN    EKG EKG Interpretation  Date/Time:  Monday June 06 2019 23:05:51 EDT Ventricular Rate:  75 PR Interval:    QRS Duration: 97 QT Interval:  395 QTC Calculation: 442 R Axis:   -32 Text Interpretation:  Sinus rhythm Abnormal R-wave progression,  early transition LVH with secondary repolarization abnormality Confirmed by Addison Lank [phone]) on 05/25/2019 11:14:00 PM   Radiology No results found.  Procedures .Critical Care Performed by: Corena Herter, PA-C Authorized by: Corena Herter, PA-C   Critical care provider statement:    Critical care time (minutes):  30   Critical care was necessary to treat or prevent imminent or life-threatening deterioration of the following conditions:  Respiratory failure   (including critical care time)  Medications Ordered in ED Medications - No data to display   Initial Impression / Assessment and Plan / ED Course  I have reviewed the triage vital signs and the nursing notes.  Pertinent labs & imaging results that were available during my care of the patient were reviewed by me and considered in my medical decision making (see chart for details).  Clinical Course as of Jun 06 58  Tue Jun 07, 2019  0058 Sodium(!): 129 [GG]    Clinical Course User Index [GG] Corena Herter, PA-C      Patient clearly is not a candidate for at-home care at this time as he was desaturating while on 4 L oxygen, even at rest.  Evidently, per the daughter, he was to be wearing 4 L oxygen while ambulating, but she states that he was hypoxic and "looked sick" as soon as he arrived home.  Received 125 mg Solu-Medrol and 2 g magnesium en route.  Currently patient is hemodynamically stable on cardiac monitor and doing well on high flow O2 saturating at 94%, but in light  of what transpired he does not appear to be candidate for discharge.  Will consult with [facility] hospitalist group to see if can once again be admitted for acute hypoxic respiratory failure in the setting of COVID-19.  12:59 AM Spoke with hospitalist who voiced concern for PE given patient's desaturation. Will obtain CXR first to r/o new pneumonia in light of elevated WBC, but if negative will proceed with CTA, pending renal function. CXR was  reviewed and compared to imaging obtained 5 days ago and there is no significant interval change.   Patient was admitted by the [facility] group.    Final Clinical Impressions(s) / ED Diagnoses   Final diagnoses:  Hypoxia  COVID-19 virus infection  SOB (shortness of breath)    ED Discharge Orders    None       Corena Herter, PA-C 06/07/19 0137    Corena Herter, PA-C 06/07/19 0640    Fatima Blank, MD 06/07/19 [phone]

## 2019-06-06 NOTE — Progress Notes (Signed)
SATURATION QUALIFICATIONS: (This note is used to comply with regulatory documentation for home oxygen)  Patient Saturations on Room Air at Rest = 94%  Patient Saturations on Room Air while Ambulating = 84%  Patient Saturations on 4L Liters of oxygen while Ambulating = 79%  Please briefly explain why patient needs home oxygen:Pt desaturates on RA with mobility and even with 4L O2, during longer distance gait.  He will need O2 for mobility at home.  Barbarann Ehlers Wetona Viramontes, PT, DPT  Acute Rehabilitation [phone] pager [phone]) [phone] office  @ [facility]: [phone]

## 2019-06-07 ENCOUNTER — Other Ambulatory Visit: Payer: Self-pay

## 2019-06-07 ENCOUNTER — Telehealth: Payer: Self-pay | Admitting: *Deleted

## 2019-06-07 ENCOUNTER — Emergency Department (HOSPITAL_COMMUNITY): Payer: Medicare Other

## 2019-06-07 DIAGNOSIS — K5190 Ulcerative colitis, unspecified, without complications: Secondary | ICD-10-CM | POA: Diagnosis not present

## 2019-06-07 DIAGNOSIS — J939 Pneumothorax, unspecified: Secondary | ICD-10-CM | POA: Diagnosis not present

## 2019-06-07 DIAGNOSIS — Z791 Long term (current) use of non-steroidal anti-inflammatories (NSAID): Secondary | ICD-10-CM | POA: Diagnosis not present

## 2019-06-07 DIAGNOSIS — I10 Essential (primary) hypertension: Secondary | ICD-10-CM

## 2019-06-07 DIAGNOSIS — J9312 Secondary spontaneous pneumothorax: Secondary | ICD-10-CM | POA: Diagnosis not present

## 2019-06-07 DIAGNOSIS — Z66 Do not resuscitate: Secondary | ICD-10-CM | POA: Diagnosis not present

## 2019-06-07 DIAGNOSIS — R0602 Shortness of breath: Secondary | ICD-10-CM | POA: Diagnosis not present

## 2019-06-07 DIAGNOSIS — J431 Panlobular emphysema: Secondary | ICD-10-CM | POA: Diagnosis not present

## 2019-06-07 DIAGNOSIS — R7989 Other specified abnormal findings of blood chemistry: Secondary | ICD-10-CM | POA: Diagnosis not present

## 2019-06-07 DIAGNOSIS — J1289 Other viral pneumonia: Secondary | ICD-10-CM

## 2019-06-07 DIAGNOSIS — I959 Hypotension, unspecified: Secondary | ICD-10-CM | POA: Diagnosis not present

## 2019-06-07 DIAGNOSIS — J1282 Pneumonia due to coronavirus disease 2019: Secondary | ICD-10-CM | POA: Diagnosis present

## 2019-06-07 DIAGNOSIS — K219 Gastro-esophageal reflux disease without esophagitis: Secondary | ICD-10-CM | POA: Diagnosis not present

## 2019-06-07 DIAGNOSIS — J440 Chronic obstructive pulmonary disease with acute lower respiratory infection: Secondary | ICD-10-CM | POA: Diagnosis not present

## 2019-06-07 DIAGNOSIS — G4700 Insomnia, unspecified: Secondary | ICD-10-CM | POA: Diagnosis not present

## 2019-06-07 DIAGNOSIS — E871 Hypo-osmolality and hyponatremia: Secondary | ICD-10-CM | POA: Diagnosis not present

## 2019-06-07 DIAGNOSIS — K5100 Ulcerative (chronic) pancolitis without complications: Secondary | ICD-10-CM | POA: Diagnosis not present

## 2019-06-07 DIAGNOSIS — R748 Abnormal levels of other serum enzymes: Secondary | ICD-10-CM | POA: Diagnosis not present

## 2019-06-07 DIAGNOSIS — J9601 Acute respiratory failure with hypoxia: Secondary | ICD-10-CM | POA: Diagnosis present

## 2019-06-07 DIAGNOSIS — E785 Hyperlipidemia, unspecified: Secondary | ICD-10-CM | POA: Diagnosis not present

## 2019-06-07 DIAGNOSIS — I2699 Other pulmonary embolism without acute cor pulmonale: Secondary | ICD-10-CM | POA: Diagnosis not present

## 2019-06-07 DIAGNOSIS — R0902 Hypoxemia: Secondary | ICD-10-CM | POA: Diagnosis not present

## 2019-06-07 DIAGNOSIS — J9382 Other air leak: Secondary | ICD-10-CM | POA: Diagnosis not present

## 2019-06-07 DIAGNOSIS — Z7902 Long term (current) use of antithrombotics/antiplatelets: Secondary | ICD-10-CM | POA: Diagnosis not present

## 2019-06-07 DIAGNOSIS — N179 Acute kidney failure, unspecified: Secondary | ICD-10-CM | POA: Diagnosis not present

## 2019-06-07 DIAGNOSIS — Z4682 Encounter for fitting and adjustment of non-vascular catheter: Secondary | ICD-10-CM | POA: Diagnosis not present

## 2019-06-07 DIAGNOSIS — J984 Other disorders of lung: Secondary | ICD-10-CM | POA: Diagnosis not present

## 2019-06-07 DIAGNOSIS — J069 Acute upper respiratory infection, unspecified: Secondary | ICD-10-CM | POA: Diagnosis not present

## 2019-06-07 DIAGNOSIS — R918 Other nonspecific abnormal finding of lung field: Secondary | ICD-10-CM | POA: Diagnosis not present

## 2019-06-07 DIAGNOSIS — U071 COVID-19: Secondary | ICD-10-CM | POA: Diagnosis not present

## 2019-06-07 DIAGNOSIS — R071 Chest pain on breathing: Secondary | ICD-10-CM | POA: Diagnosis not present

## 2019-06-07 DIAGNOSIS — Z515 Encounter for palliative care: Secondary | ICD-10-CM | POA: Diagnosis not present

## 2019-06-07 DIAGNOSIS — J189 Pneumonia, unspecified organism: Secondary | ICD-10-CM | POA: Diagnosis not present

## 2019-06-07 DIAGNOSIS — I739 Peripheral vascular disease, unspecified: Secondary | ICD-10-CM | POA: Diagnosis not present

## 2019-06-07 DIAGNOSIS — J9383 Other pneumothorax: Secondary | ICD-10-CM | POA: Diagnosis not present

## 2019-06-07 DIAGNOSIS — J95811 Postprocedural pneumothorax: Secondary | ICD-10-CM | POA: Diagnosis not present

## 2019-06-07 DIAGNOSIS — Z9689 Presence of other specified functional implants: Secondary | ICD-10-CM | POA: Diagnosis not present

## 2019-06-07 DIAGNOSIS — R079 Chest pain, unspecified: Secondary | ICD-10-CM | POA: Diagnosis not present

## 2019-06-07 DIAGNOSIS — E860 Dehydration: Secondary | ICD-10-CM | POA: Diagnosis not present

## 2019-06-07 DIAGNOSIS — J9311 Primary spontaneous pneumothorax: Secondary | ICD-10-CM | POA: Diagnosis not present

## 2019-06-07 DIAGNOSIS — E875 Hyperkalemia: Secondary | ICD-10-CM | POA: Diagnosis not present

## 2019-06-07 LAB — CBC WITH DIFFERENTIAL/PLATELET
Abs Immature Granulocytes: 0.93 10*3/uL — ABNORMAL HIGH (ref 0.00–0.07)
Basophils Absolute: 0.1 10*3/uL (ref 0.0–0.1)
Basophils Relative: 1 %
Eosinophils Absolute: 0.2 10*3/uL (ref 0.0–0.5)
Eosinophils Relative: 1 %
HCT: 46.5 % (ref 39.0–52.0)
Hemoglobin: 15.8 g/dL (ref 13.0–17.0)
Immature Granulocytes: 6 %
Lymphocytes Relative: 3 %
Lymphs Abs: 0.5 10*3/uL — ABNORMAL LOW (ref 0.7–4.0)
MCH: 28.2 pg (ref 26.0–34.0)
MCHC: 34 g/dL (ref 30.0–36.0)
MCV: 82.9 fL (ref 80.0–100.0)
Monocytes Absolute: 1 10*3/uL (ref 0.1–1.0)
Monocytes Relative: 6 %
Neutro Abs: 13.9 10*3/uL — ABNORMAL HIGH (ref 1.7–7.7)
Neutrophils Relative %: 83 %
Platelets: 444 10*3/uL — ABNORMAL HIGH (ref 150–400)
RBC: 5.61 MIL/uL (ref 4.22–5.81)
RDW: 16.3 % — ABNORMAL HIGH (ref 11.5–15.5)
WBC: 16.5 10*3/uL — ABNORMAL HIGH (ref 4.0–10.5)
nRBC: 0 % (ref 0.0–0.2)

## 2019-06-07 LAB — C-REACTIVE PROTEIN: CRP: <0.8 mg/dL (ref <1.0)

## 2019-06-07 LAB — COMPREHENSIVE METABOLIC PANEL
ALT: 116 U/L — ABNORMAL HIGH (ref 0–44)
AST: 71 U/L — ABNORMAL HIGH (ref 15–41)
Albumin: 2.9 g/dL — ABNORMAL LOW (ref 3.5–5.0)
Alkaline Phosphatase: 71 U/L (ref 38–126)
Anion gap: 11 (ref 5–15)
BUN: 49 mg/dL — ABNORMAL HIGH (ref 8–23)
CO2: 19 mmol/L — ABNORMAL LOW (ref 22–32)
Calcium: 8.5 mg/dL — ABNORMAL LOW (ref 8.9–10.3)
Chloride: 99 mmol/L (ref 98–111)
Creatinine, Ser: 1.32 mg/dL — ABNORMAL HIGH (ref 0.61–1.24)
GFR calc Af Amer: 60 mL/min — ABNORMAL LOW (ref >60)
GFR calc non Af Amer: 52 mL/min — ABNORMAL LOW (ref >60)
Glucose, Bld: 126 mg/dL — ABNORMAL HIGH (ref 70–99)
Potassium: 4.8 mmol/L (ref 3.5–5.1)
Sodium: 129 mmol/L — ABNORMAL LOW (ref 135–145)
Total Bilirubin: 1.2 mg/dL (ref 0.3–1.2)
Total Protein: 6 g/dL — ABNORMAL LOW (ref 6.5–8.1)

## 2019-06-07 LAB — D-DIMER, QUANTITATIVE: D-Dimer, Quant: 2.92 ug/mL-FEU — ABNORMAL HIGH (ref 0.00–0.50)

## 2019-06-07 LAB — PROCALCITONIN: Procalcitonin: <0.1 ng/mL

## 2019-06-07 MED ORDER — ALBUTEROL SULFATE HFA 108 (90 BASE) MCG/ACT IN AERS
2.0000 | INHALATION_SPRAY | Freq: Four times a day (QID) | RESPIRATORY_TRACT | Status: DC | PRN
  Administered 2019-06-11 – 2019-06-18 (×10): 2 via RESPIRATORY_TRACT
  Filled 2019-06-07: qty 6.7

## 2019-06-07 MED ORDER — SALINE SPRAY 0.65 % NA SOLN
1.0000 | NASAL | Status: DC | PRN
  Administered 2019-06-16: 09:00:00 1 via NASAL
  Filled 2019-06-07 (×2): qty 44

## 2019-06-07 MED ORDER — VITAMIN B-12 1000 MCG PO TABS
1000.0000 ug | ORAL_TABLET | Freq: Every day | ORAL | Status: DC
  Administered 2019-06-07 – 2019-06-22 (×16): 1000 ug via ORAL
  Filled 2019-06-07 (×20): qty 1

## 2019-06-07 MED ORDER — MOMETASONE FURO-FORMOTEROL FUM 200-5 MCG/ACT IN AERO
2.0000 | INHALATION_SPRAY | Freq: Two times a day (BID) | RESPIRATORY_TRACT | Status: DC
  Administered 2019-06-07 – 2019-06-09 (×5): 2 via RESPIRATORY_TRACT
  Filled 2019-06-07: qty 8.8

## 2019-06-07 MED ORDER — MESALAMINE 1.2 G PO TBEC
1.2000 g | DELAYED_RELEASE_TABLET | Freq: Every day | ORAL | Status: DC
  Administered 2019-06-08 – 2019-06-21 (×12): 1.2 g via ORAL
  Filled 2019-06-07 (×16): qty 1

## 2019-06-07 MED ORDER — ACETAMINOPHEN 325 MG PO TABS
650.0000 mg | ORAL_TABLET | Freq: Four times a day (QID) | ORAL | Status: DC | PRN
  Administered 2019-06-07 – 2019-06-19 (×14): 650 mg via ORAL
  Filled 2019-06-07 (×13): qty 2

## 2019-06-07 MED ORDER — HYDROCOD POLST-CPM POLST ER 10-8 MG/5ML PO SUER
5.0000 mL | Freq: Two times a day (BID) | ORAL | Status: DC | PRN
  Administered 2019-06-07 – 2019-06-22 (×6): 5 mL via ORAL
  Filled 2019-06-07 (×6): qty 5

## 2019-06-07 MED ORDER — MESALAMINE 1.2 G PO TBEC
1.2000 g | DELAYED_RELEASE_TABLET | Freq: Every day | ORAL | Status: DC
  Filled 2019-06-07 (×3): qty 1

## 2019-06-07 MED ORDER — ALBUTEROL SULFATE HFA 108 (90 BASE) MCG/ACT IN AERS
2.0000 | INHALATION_SPRAY | Freq: Four times a day (QID) | RESPIRATORY_TRACT | Status: DC
  Administered 2019-06-07 – 2019-06-09 (×7): 2 via RESPIRATORY_TRACT
  Filled 2019-06-07: qty 6.7

## 2019-06-07 MED ORDER — ALLOPURINOL 100 MG PO TABS: 200.0000 mg | ORAL_TABLET | Freq: Every day | ORAL | Status: DC

## 2019-06-07 MED ORDER — CLOPIDOGREL BISULFATE 75 MG PO TABS
75.0000 mg | ORAL_TABLET | Freq: Every day | ORAL | Status: DC
  Administered 2019-06-07 – 2019-06-22 (×16): 75 mg via ORAL
  Filled 2019-06-07 (×15): qty 1

## 2019-06-07 MED ORDER — GUAIFENESIN-DM 100-10 MG/5ML PO SYRP
10.0000 mL | ORAL_SOLUTION | ORAL | Status: DC | PRN
  Administered 2019-06-10 – 2019-06-21 (×5): 10 mL via ORAL
  Filled 2019-06-07 (×5): qty 10

## 2019-06-07 MED ORDER — ONDANSETRON HCL 4 MG PO TABS: 4.0000 mg | ORAL_TABLET | Freq: Four times a day (QID) | ORAL | Status: DC | PRN

## 2019-06-07 MED ORDER — PANTOPRAZOLE SODIUM 40 MG PO TBEC
40.0000 mg | DELAYED_RELEASE_TABLET | Freq: Every day | ORAL | Status: DC
  Administered 2019-06-07 – 2019-06-22 (×16): 40 mg via ORAL
  Filled 2019-06-07 (×17): qty 1

## 2019-06-07 MED ORDER — ONDANSETRON HCL 4 MG/2ML IJ SOLN: 4.0000 mg | Freq: Four times a day (QID) | INTRAMUSCULAR | Status: DC | PRN

## 2019-06-07 MED ORDER — GABAPENTIN 100 MG PO CAPS
100.0000 mg | ORAL_CAPSULE | Freq: Every day | ORAL | Status: DC
  Administered 2019-06-07 – 2019-06-15 (×9): 100 mg via ORAL
  Filled 2019-06-07 (×9): qty 1

## 2019-06-07 MED ORDER — ENOXAPARIN SODIUM 40 MG/0.4ML ~~LOC~~ SOLN
40.0000 mg | Freq: Every day | SUBCUTANEOUS | Status: DC
  Administered 2019-06-07: 40 mg via SUBCUTANEOUS
  Filled 2019-06-07 (×2): qty 0.4

## 2019-06-07 MED ORDER — AEROCHAMBER PLUS FLO-VU LARGE MISC
Status: AC
  Administered 2019-06-07: 16:00:00
  Filled 2019-06-07: qty 1

## 2019-06-07 MED ORDER — LISINOPRIL-HYDROCHLOROTHIAZIDE 20-25 MG PO TABS: 1.0000 | ORAL_TABLET | Freq: Every day | ORAL | Status: DC

## 2019-06-07 MED ORDER — ZOLPIDEM TARTRATE 5 MG PO TABS
5.0000 mg | ORAL_TABLET | Freq: Every evening | ORAL | Status: DC | PRN
  Administered 2019-06-07 – 2019-06-10 (×4): 5 mg via ORAL
  Filled 2019-06-07 (×4): qty 1

## 2019-06-07 NOTE — ED Notes (Signed)
[facility] Called

## 2019-06-07 NOTE — Progress Notes (Signed)
This nurse spent 15 minutes talking to patients daughter Arrie Aran updating on patient status and plan of care. [name] stated she was still very shaken up from experience of patient coming home and stated the duration of him being home the highest she could get his oxygen was 88%, that he desaturated down to low 30's and 40's at times and went into hypoxic delirium, turning blue and really frightening her. [name] requested to talk to the Dr as she states she hasn't heard from anyone since her father left their home in that condition. This nurse and [name] discussed roles of night Dr. Versus day Dr. And [name] requests to speak with the Dr. On day shift. This nurse will pass patient request on to day shift.

## 2019-06-07 NOTE — H&P (Signed)
History and Physical    [name] [name]:[id number] DOB: Oct 17, 1940 DOA: 05/21/2019  PCP: Biagio Borg, MD  Patient coming from: Home  I have personally briefly reviewed patient's old medical records in [place]  Chief Complaint: Hypoxia  HPI: [name] [name] is a 78 y.o. male with medical history significant of hypertension, hyperlipidemia, COPD, peripheral vascular disease, Barrett's esophagus, and GERD.  Patient was just admitted from 9/19-9/28 at [facility] for COVID pneumonia.  On discharge he was still apparently desaturating with ambulation and requiring 4L with ambulation.  After getting home he apparently was very ill and SOB.  Daughter called EMS.  Despite being on 4L at all times at home, he was satting 60-80%.  Brought in to ED.   ED Course: Now satting mid 3s but on 15L NRB.  CXR read pending but looks grossly unchanged.  Creat slightly up from 4 days ago to 1.3 (was 0.9).  WBC 16.5k.   Review of Systems: As per HPI, otherwise all review of systems negative.  Past Medical History:  Diagnosis Date  . Abdominal pain, unspecified site 10/16/2009  . Anal fissure 04/24/2008  . Arthritis   . B12 deficiency 10/11/2014  . BACK PAIN 01/10/2009  . Barrett's esophagus 1997  . BARRETTS ESOPHAGUS 08/16/2007  . BREAST HYPERTROPHY 02/16/2009  . CARBUNCLE, NECK 02/14/2008  . Cataract   . DIVERTICULOSIS, COLON 06/28/2007  . Full dentures   . GERD 06/28/2007  . GLUCOSE INTOLERANCE 08/15/2008  . Headache(784.0) 11/13/2009  . HERNIA, UMBILICAL W/OBSTRUCTION W/O GANGRENE 06/28/2007  . HTN (hypertension) 07/12/2012  . HYPERLIPIDEMIA 06/28/2007  . HYPERTENSION 06/28/2007  . Impaired glucose tolerance 04/13/2011  . INSOMNIA-SLEEP DISORDER-UNSPEC 10/10/2008  . LEG PAIN, BILATERAL 02/16/2009  . LOW BACK PAIN 06/28/2007  . MASTITIS 01/10/2009  . NECK PAIN 05/31/2008  . NEPHROLITHIASIS, HX OF 06/28/2007  . PROCTOSIGMOIDITIS, ULCERATIVE 1997  . PVD (peripheral vascular disease) with  claudication, lifestyle limiting, ABI rt. 0.67, lt. 0.73 07/12/2012  . S/P angioplasty with stent, after DB athrectomy to Rt. ext. iliac and Rt. SFA 07/12/2012  . Skin cancer    right hand  . Spinal stenosis at L4-L5 level   . Stenosis of left carotid artery, mod to severe by dopplers 09/29/2012  . TESTICULAR PAIN, RIGHT 10/25/2010  . VITAMIN D DEFICIENCY 11/13/2009    Past Surgical History:  Procedure Laterality Date  . ABDOMINAL AORTOGRAM N/A 05/13/2018   Procedure: ABDOMINAL AORTOGRAM;  Surgeon: [name], MD;  Location: [place] [facility];  Service: Cardiovascular;  Laterality: N/A;  . anal fissure surgury     pt unsure of this  . ATHERECTOMY  09/28/2012  . ATHERECTOMY N/A 07/12/2012   Procedure: ATHERECTOMY;  Surgeon: [name], MD;  Location: [facility] CATH [facility];  Service: Cardiovascular;  Laterality: N/A;  . ATHERECTOMY N/A 09/28/2012   Procedure: ATHERECTOMY;  Surgeon: [name], MD;  Location: [facility] CATH LAB;  Service: Cardiovascular;  Laterality: N/A;  . bilateral inguinal hernia with mesh and umbilical hernia repairs  09/2007  . CATARACT EXTRACTION Bilateral    bilateral  . COLONOSCOPY    . ENDARTERECTOMY FEMORAL Right 07/19/2018   Procedure: ENDARTERECTOMY RIGHT FEMORAL ARTERY;  Surgeon: [name], MD;  Location: [place];  Service: Vascular;  Laterality: Right;  . GROIN DISSECTION Right 07/20/2013   Procedure: GROIN EXPLORATION, EXCISIONAL BIOPSY RIGHT INGUINAL LYMPH NODES;  Surgeon: Harl Bowie, MD;  Location: [place];  Service: General;  Laterality: Right;  .  HERNIA REPAIR    . left knee arthroscopy    . LOWER EXTREMITY ANGIOGRAPHY Bilateral 05/13/2018   Procedure: Lower Extremity Angiography;  Surgeon: [name], MD;  Location: [place] [facility];  Service: Cardiovascular;  Laterality: Bilateral;  . LUMBAR LAMINECTOMY/DECOMPRESSION MICRODISCECTOMY N/A 11/05/2016   Procedure: Microlumbar decompression L2-3, L3-4, L4-5,  lateral mass fusion L4-5;  Surgeon: Susa Day, MD;  Location: [facility];  Service: Orthopedics;  Laterality: N/A;  . PATCH ANGIOPLASTY Right 07/19/2018   Procedure: RIGHT FEMORAL PATCH ANGIOPLASTY WITH XENOSURE BIOLOGIC PATCH;  Surgeon: [name], MD;  Location: [place];  Service: Vascular;  Laterality: Right;  . PERCUTANEOUS STENT INTERVENTION  09/28/2012   Procedure: PERCUTANEOUS STENT INTERVENTION;  Surgeon: [name], MD;  Location: [facility] CATH LAB;  Service: Cardiovascular;;  . PERIPHERAL VASCULAR INTERVENTION  05/13/2018   Procedure: PERIPHERAL VASCULAR INTERVENTION;  Surgeon: [name], MD;  Location: [place] [facility];  Service: Cardiovascular;;  Ext. Iliac  . UPPER GI ENDOSCOPY       reports that he quit smoking about 46 years ago. He has quit using smokeless tobacco.  His smokeless tobacco use included chew. He reports previous alcohol use of about 5.0 standard drinks of alcohol per week. He reports that he does not use drugs.  Allergies  Allergen Reactions  . Wasp Venom Anaphylaxis  . Contrast Media [Iodinated Diagnostic Agents] Hives    Break out in welts.  Needs 13-hr prep.  . Statins Other (See Comments)    Legs pain: Atorvastatin and Lovastatin    Family History  Problem Relation Age of Onset  . COPD Mother   . Cerebral aneurysm Mother   . COPD Father   . Colon cancer Neg Hx   . Esophageal cancer Neg Hx   . Rectal cancer Neg Hx   . Stomach cancer Neg Hx   . Colon polyps Neg Hx      Prior to Admission medications   Medication Sig Start Date End Date Taking? Authorizing Provider  albuterol (VENTOLIN HFA) 108 (90 Base) MCG/ACT inhaler Inhale 2 puffs into the lungs every 6 (six) hours as needed for wheezing or shortness of breath. 01/28/19   Biagio Borg, MD  allopurinol (ZYLOPRIM) 100 MG tablet Take 2 tablets (200 mg total) by mouth daily. 05/12/18   Lyndal Pulley, DO  budesonide-formoterol (SYMBICORT) 160-4.5 MCG/ACT inhaler Inhale 2 puffs  into the lungs 2 (two) times daily. 01/28/19   Biagio Borg, MD  clopidogrel (PLAVIX) 75 MG tablet TAKE 1 TABLET BY MOUTH  DAILY Patient taking differently: Take 75 mg by mouth daily.  11/25/18   Biagio Borg, MD  cyanocobalamin 1000 MCG tablet Take 1,000 mcg by mouth daily.     [provider]  gabapentin (NEURONTIN) 100 MG capsule Take 1 capsule (100 mg total) by mouth at bedtime. 04/29/19   Lyndal Pulley, DO  lisinopril-hydrochlorothiazide (PRINZIDE,ZESTORETIC) 20-25 MG tablet TAKE 1 TABLET BY MOUTH  DAILY 11/25/18   Biagio Borg, MD  mesalamine (APRISO) 0.375 g 24 hr capsule Take 4 capsules (1.5 g total) by mouth daily. Take 4 tablets all at one time. Patient taking differently: Take 1.5 g by mouth at bedtime. Take 4 tablets all at one time. 02/08/19   Ladene Artist, MD  pantoprazole (PROTONIX) 40 MG tablet Take 1 tablet (40 mg total) by mouth daily. 02/08/19   Ladene Artist, MD  zolpidem (AMBIEN) 10 MG tablet TAKE 1 TABLET BY MOUTH AT  BEDTIME AS NEEDED FOR SLEEP. Patient taking differently: Take 10 mg by mouth at bedtime.  03/02/19   Biagio Borg, MD    Physical Exam: Vitals:   06/05/2019 2300 05/20/2019 2307 06/08/2019 2345 06/07/19 0033  BP:  (!) 104/44 (!) 114/50 (!) 118/53  Pulse: 76 68    Resp: (!) 27 (!) 23 (!) 22 20  Temp: (!) 97.4 F (36.3 C)     TempSrc: Oral     SpO2: (!) 77% (!) 89%      Constitutional: NAD, calm, comfortable Eyes: PERRL, lids and conjunctivae normal ENMT: Mucous membranes are moist. Posterior pharynx clear of any exudate or lesions.Normal dentition.  Neck: normal, supple, no masses, no thyromegaly Respiratory: clear to auscultation bilaterally, no wheezing, no crackles. Normal respiratory effort. No accessory muscle use.  Cardiovascular: Regular rate and rhythm, no murmurs / rubs / gallops. No extremity edema. 2+ pedal pulses. No carotid bruits.  Abdomen: no tenderness, no masses palpated. No hepatosplenomegaly. Bowel sounds positive.   Musculoskeletal: no clubbing / cyanosis. No joint deformity upper and lower extremities. Good ROM, no contractures. Normal muscle tone.  Skin: no rashes, lesions, ulcers. No induration Neurologic: CN 2-12 grossly intact. Sensation intact, DTR normal. Strength 5/5 in all 4.  Psychiatric: Normal judgment and insight. Alert and oriented x 3. Normal mood.    Labs on Admission: I have personally reviewed following labs and imaging studies  CBC: Recent Labs  Lab 05/31/19 0205 06/01/19 0220 06/02/19 0145 05/17/2019 2354  WBC 9.7 9.3 9.1 16.5*  NEUTROABS 8.8* 8.1* 8.0* PENDING  HGB 14.1 13.8 13.0 15.8  HCT 41.4 40.2 38.5* 46.5  MCV 82.8 82.4 83.0 82.9  PLT 236 258 256 656*   Basic Metabolic Panel: Recent Labs  Lab 05/31/19 0205 06/01/19 0220 06/02/19 0145 05/16/2019 2358  NA 136 136 134* 129*  K 3.7 3.7 3.9 4.8  CL 105 105 103 99  CO2 20* 21* 20* 19*  GLUCOSE 134* 106* 119* 126*  BUN 24* 30* 31* 49*  CREATININE 0.81 0.92 0.93 1.32*  CALCIUM 8.3* 8.1* 8.1* 8.5*  MG 1.8 1.9 2.0  --    GFR: Estimated Creatinine Clearance: 54.6 mL/min (A) (by C-G formula based on SCr of 1.32 mg/dL (H)). Liver Function Tests: Recent Labs  Lab 05/31/19 0205 06/01/19 0220 06/02/19 0145 05/11/2019 2358  AST 34 31 27 71*  ALT 28 30 29  116*  ALKPHOS 54 60 60 71  BILITOT 0.8 0.9 0.9 1.2  PROT 5.8* 5.8* 5.6* 6.0*  ALBUMIN 2.8* 2.7* 2.6* 2.9*   No results for input(s): LIPASE, AMYLASE in the last 168 hours. No results for input(s): AMMONIA in the last 168 hours. Coagulation Profile: No results for input(s): INR, PROTIME in the last 168 hours. Cardiac Enzymes: No results for input(s): CKTOTAL, CKMB, CKMBINDEX, TROPONINI in the last 168 hours. BNP (last 3 results) Recent Labs    02/07/19 0954  PROBNP 57.0   HbA1C: No results for input(s): HGBA1C in the last 72 hours. CBG: No results for input(s): GLUCAP in the last 168 hours. Lipid Profile: No results for input(s): CHOL, HDL, LDLCALC,  TRIG, CHOLHDL, LDLDIRECT in the last 72 hours. Thyroid Function Tests: No results for input(s): TSH, T4TOTAL, FREET4, T3FREE, THYROIDAB in the last 72 hours. Anemia Panel: Recent Labs    06/05/19 0338 05/23/2019 0213  FERRITIN 282 291   Urine analysis:    Component Value Date/Time   COLORURINE YELLOW 02/07/2019 [place] 02/07/2019 0954   LABSPEC 1.020  02/07/2019 0954   PHURINE 6.0 02/07/2019 0954   GLUCOSEU NEGATIVE 02/07/2019 0954   HGBUR NEGATIVE 02/07/2019 0954   BILIRUBINUR NEGATIVE 02/07/2019 [place] 02/07/2019 0954   PROTEINUR NEGATIVE 07/12/2018 1506   UROBILINOGEN 0.2 02/07/2019 0954   NITRITE NEGATIVE 02/07/2019 0954   LEUKOCYTESUR NEGATIVE 02/07/2019 0954    Radiological Exams on Admission: No results found.  EKG: Independently reviewed.  Assessment/Plan Principal Problem:   COVID-19 virus infection Active Problems:   Essential hypertension   Chronic universal ulcerative colitis ([place])   Acute respiratory failure with hypoxia (HCC)   Elevated liver enzymes    1. COVID-19 PNA with acute respiratory failure with hypoxia - 1. Worsening hypoxia at home: DDx is worsening COVID vs PE vs superimposed CAP 2. COVID pathway 3. Already s/p remdesivir, actemra. 4. Just got off of dexamethasone yesterday 1. Consider restarting dexamethasone if CRP (pending currently) has re-elevated (had trended down to 1.1 as of yesterday). 5. Superimposed CAP is possible, however: 1. No worsening in CXR, new fever symptoms 2. Procalcitonin pending 3. Will hold off on ABx for now unless procalcitonin positive or he has left shift on the diff. 6. PE is possible, Checking D.Dimer: 1. If D.Dimer > 5 then would put on wt based lovenox (full dose anticoagulation) as per our COVID protocols 2. If D.Dimer < 5 then may need CTA chest as next step to eval for PE 2. COPD - 1. Continue symbicort 3. H/o UC - continue mesalamine 4. HTN - 1. Mild bump in  creat today to 1.3 and Na 129, will hold lisinopril / HCTZ for the moment. 2. Repeat BMP in AM 3. Trying to avoid IVF though given respiratory deterioration 5. Mild elevated liver enzymes - 1. Re-elevated since 9/24 2. Keep eye on this with daily CMP 6. Other meds: 1. Cont gabapentin 2. Cont PPI 3. Cont plavix 4. Hold alopurinol  DVT prophylaxis: Lovenox Code Status: Full Family Communication: No family in room Disposition Plan: Home after admit Consults called: None Admission status: Admit to inpatient  Severity of Illness: The appropriate patient status for this patient is INPATIENT. Inpatient status is judged to be reasonable and necessary in order to provide the required intensity of service to ensure the patient's safety. The patient's presenting symptoms, physical exam findings, and initial radiographic and laboratory data in the context of their chronic comorbidities is felt to place them at high risk for further clinical deterioration. Furthermore, it is not anticipated that the patient will be medically stable for discharge from the hospital within 2 midnights of admission. The following factors support the patient status of inpatient.   Re-admitting patient due to worsening hypoxia, increasing O2 requirement, now requiring 15L.  * I certify that at the point of admission it is my clinical judgment that the patient will require inpatient hospital care spanning beyond 2 midnights from the point of admission due to high intensity of service, high risk for further deterioration and high frequency of surveillance required.*    GARDNER, JARED M. DO [facility]  How to contact the [facility] Attending or Consulting provider [place] or covering provider during after hours [place], for this patient?  1. Check the care team in [facility] and look for a) attending/consulting [facility] provider listed and b) the [facility] team listed 2. Log into [email or web address]  Amion Physician Scheduling and messaging for  groups and whole hospitals  On call and physician scheduling software for group practices, residents, hospitalists and other medical  providers for call, clinic, rotation and shift schedules. OnCall [employer] is a hospital-wide system for scheduling doctors and paging doctors on call. EasyPlot is for scientific plotting and data analysis.  [email or web address]  and use [place]'s universal password to access. If you do not have the password, please contact the hospital operator.  3. Locate the [facility] provider you are looking for under [facility] and page to a number that you can be directly reached. 4. If you still have difficulty reaching the provider, please page the [facility] (Director on Call) for the Hospitalists listed on amion for assistance.  06/07/2019, 1:04 AM

## 2019-06-07 NOTE — ED Notes (Signed)
Lunch Tray Ordered @ 0888.

## 2019-06-07 NOTE — ED Notes (Signed)
SDU ordered bfast

## 2019-06-07 NOTE — ED Notes (Signed)
Condom cath applied to pt.

## 2019-06-07 NOTE — Telephone Encounter (Signed)
Pt was on TCM report was d/c 05/13/2019 h/o COVID and was discharged from [facility] this am, here for SOB found to be hypoxic with sats in 60%- 80% on 4LNC by EMS. Placed on NRB. EKG with new Afib.

## 2019-06-07 NOTE — Progress Notes (Signed)
Patient oxygen saturation went down to 70s during repositioning. This nurse increased HFNC to 15L until patient gained oxygen saturation back to 96 %, remained in room with patient 10 minutes after decreasing oxygen back to 10 L HFNC. Patient relaxed and resting comfortably

## 2019-06-07 NOTE — Progress Notes (Signed)
    DAY ZERO NOTE    [name] [name]  YDX:[id number] DOB: 04-Oct-1940 DOA: 06/04/2019 PCP: Biagio Borg, MD   Brief Narrative:  Please see H/P for full details - this is a day zero note.  Assessment & Plan:  HMCNO-[id number] with acute hypoxic respiratory failure, POA, improving - S/P remdesivir x5 days - Acemtra given 06/03/19 with markedly improved CRP and symptoms -Previously ambulating with sats in the high 80s on 4 L -admitted on simple mask at 15 L, vitals overnight indicate patient was on room air, but now back on 15 L high flow nasal cannula -Repeat chest x-ray at admission, generally unchanged from previous on 06/01/2019 - Resume dexamethasone - VTE PPx with Lovenox - Continue zinc and vitamin C - Flutter valve, continue to prone, incentive spirometry q2hrs while awake Recent Labs    06/05/19 0338 05/14/2019 0213 05/16/2019 2358  DDIMER 2.37* 2.52* 2.92*  FERRITIN 282 291  --   CRP 2.7* 1.1* <0.8   COPD, not in acute exacerbation - No evidence of acute bronchospasm - Continue Symbicort  Hyperkalemia, resolved - Resolved with fluids  Hyponatremia, likely hypovolemic in nature - Resolved with fluids  Ulcerative colitis, history of, stable - No active disease - Continue mesalamine  Hypertension, essential Peripheral vascular disease secondary prevention - BP controlled - Continue Plavix - Continue HCTZ, lisinopril  Elevated liver enzymes - Mild, LFTs stable  Other medications - Continue gabapentin - Continue PPI - Hold allopurinol  MDM and disposition: The below labs and imaging reports were reviewed and summarized above.  Medication management as above. The patient was admitted with COVID-19.  He remains on O2, desaturates to mid 80s on room air.   DVT prophylaxis: Lovenox Code Status: FULL Disposition: Likely home in the next 24 to 48 hours pending clinical course, resolution of hypoxia Family Communication: Daughter called, lengthy discussion about patient's  condition and likely improvement in the next 24 to 48 hours given new medications and improving symptoms.  Procedures:   CXR 9/19 -- bilateral pneumonia  CXR 9/23 -- similar bilateral pneumonia    LOS: 0 days   Time spent: 17 minutes   Little Ishikawa, DO [facility] 06/07/2019, 7:18 AM   Please page through AMION:  [email or web address] Contact charge nurse for password If 7PM-7AM, please contact night-coverage

## 2019-06-07 NOTE — ED Notes (Signed)
Pt becomes dyspneic with any movement-- sats are 87-94% on NRB

## 2019-06-07 NOTE — ED Notes (Signed)
Pt updated with approx ETA of [facility] for transfer to [facility]

## 2019-06-07 NOTE — Telephone Encounter (Signed)
Pt is still in the ED will call pt once he has been discharge to set-up TCM appt if need.Marland KitchenJohny [name]

## 2019-06-08 ENCOUNTER — Inpatient Hospital Stay (HOSPITAL_COMMUNITY): Payer: Medicare Other

## 2019-06-08 DIAGNOSIS — R7989 Other specified abnormal findings of blood chemistry: Secondary | ICD-10-CM

## 2019-06-08 LAB — CBC WITH DIFFERENTIAL/PLATELET
Abs Immature Granulocytes: 0.7 10*3/uL — ABNORMAL HIGH (ref 0.00–0.07)
Basophils Absolute: 0.1 10*3/uL (ref 0.0–0.1)
Basophils Relative: 1 %
Eosinophils Absolute: 0.2 10*3/uL (ref 0.0–0.5)
Eosinophils Relative: 1 %
HCT: 44.7 % (ref 39.0–52.0)
Hemoglobin: 14.6 g/dL (ref 13.0–17.0)
Immature Granulocytes: 5 %
Lymphocytes Relative: 3 %
Lymphs Abs: 0.5 10*3/uL — ABNORMAL LOW (ref 0.7–4.0)
MCH: 27.4 pg (ref 26.0–34.0)
MCHC: 32.7 g/dL (ref 30.0–36.0)
MCV: 83.9 fL (ref 80.0–100.0)
Monocytes Absolute: 0.7 10*3/uL (ref 0.1–1.0)
Monocytes Relative: 5 %
Neutro Abs: 11.8 10*3/uL — ABNORMAL HIGH (ref 1.7–7.7)
Neutrophils Relative %: 85 %
Platelets: 347 10*3/uL (ref 150–400)
RBC: 5.33 MIL/uL (ref 4.22–5.81)
RDW: 16.7 % — ABNORMAL HIGH (ref 11.5–15.5)
WBC: 13.9 10*3/uL — ABNORMAL HIGH (ref 4.0–10.5)
nRBC: 0 % (ref 0.0–0.2)

## 2019-06-08 LAB — COMPREHENSIVE METABOLIC PANEL
ALT: 205 U/L — ABNORMAL HIGH (ref 0–44)
AST: 95 U/L — ABNORMAL HIGH (ref 15–41)
Albumin: 3 g/dL — ABNORMAL LOW (ref 3.5–5.0)
Alkaline Phosphatase: 67 U/L (ref 38–126)
Anion gap: 8 (ref 5–15)
BUN: 38 mg/dL — ABNORMAL HIGH (ref 8–23)
CO2: 22 mmol/L (ref 22–32)
Calcium: 8.5 mg/dL — ABNORMAL LOW (ref 8.9–10.3)
Chloride: 102 mmol/L (ref 98–111)
Creatinine, Ser: 1.04 mg/dL (ref 0.61–1.24)
GFR calc Af Amer: >60 mL/min (ref >60)
GFR calc non Af Amer: >60 mL/min (ref >60)
Glucose, Bld: 102 mg/dL — ABNORMAL HIGH (ref 70–99)
Potassium: 4.6 mmol/L (ref 3.5–5.1)
Sodium: 132 mmol/L — ABNORMAL LOW (ref 135–145)
Total Bilirubin: 0.9 mg/dL (ref 0.3–1.2)
Total Protein: 5.6 g/dL — ABNORMAL LOW (ref 6.5–8.1)

## 2019-06-08 LAB — D-DIMER, QUANTITATIVE: D-Dimer, Quant: 5.19 ug/mL-FEU — ABNORMAL HIGH (ref 0.00–0.50)

## 2019-06-08 LAB — C-REACTIVE PROTEIN: CRP: <0.8 mg/dL (ref <1.0)

## 2019-06-08 MED ORDER — ENOXAPARIN SODIUM 100 MG/ML ~~LOC~~ SOLN
100.0000 mg | Freq: Two times a day (BID) | SUBCUTANEOUS | Status: DC
  Administered 2019-06-08 – 2019-06-10 (×4): 100 mg via SUBCUTANEOUS
  Filled 2019-06-08 (×8): qty 1

## 2019-06-08 MED ORDER — METHYLPREDNISOLONE SODIUM SUCC 40 MG IJ SOLR
40.0000 mg | Freq: Four times a day (QID) | INTRAMUSCULAR | Status: AC
  Administered 2019-06-08 (×3): 40 mg via INTRAVENOUS
  Filled 2019-06-08 (×3): qty 1

## 2019-06-08 MED ORDER — DIPHENHYDRAMINE HCL 25 MG PO CAPS
50.0000 mg | ORAL_CAPSULE | Freq: Once | ORAL | Status: AC
  Administered 2019-06-08: 50 mg via ORAL
  Filled 2019-06-08: qty 2

## 2019-06-08 NOTE — Progress Notes (Signed)
Spoke with patients emergency contact [place]. Educated her on what v/q scan is and provided updates on plan for patient to go to [facility] tomorrow for the scan to be performed. Updated [name] on patient status as well.

## 2019-06-08 NOTE — Progress Notes (Signed)
ANTICOAGULATION CONSULT NOTE - Initial Consult  Pharmacy Consult for Lovenox Indication: VTE treatment  Allergies  Allergen Reactions  . Wasp Venom Anaphylaxis  . Contrast Media [Iodinated Diagnostic Agents] Hives    Break out in welts.  Needs 13-hr prep.  . Statins Other (See Comments)    Legs pain: Atorvastatin and Lovastatin    Patient Measurements: Height: 5' 8"  (172.7 cm) IBW/kg (Calculated) : 68.4  Vital Signs: Temp: 97.6 F (36.4 C) (09/30 0500) Temp Source: Oral (09/30 0725) BP: 125/60 (09/30 0725) Pulse Rate: 65 (09/30 0725)  Labs: Recent Labs    05/28/2019 2354 05/16/2019 2358 06/08/19 0411  HGB 15.8  --  14.6  HCT 46.5  --  44.7  PLT 444*  --  347  CREATININE  --  1.32* 1.04    Estimated Creatinine Clearance: 69.3 mL/min (by C-G formula based on SCr of 1.04 mg/dL).   Medical History: Past Medical History:  Diagnosis Date  . Abdominal pain, unspecified site 10/16/2009  . Anal fissure 04/24/2008  . Arthritis   . B12 deficiency 10/11/2014  . BACK PAIN 01/10/2009  . Barrett's esophagus 1997  . BARRETTS ESOPHAGUS 08/16/2007  . BREAST HYPERTROPHY 02/16/2009  . CARBUNCLE, NECK 02/14/2008  . Cataract   . DIVERTICULOSIS, COLON 06/28/2007  . Full dentures   . GERD 06/28/2007  . GLUCOSE INTOLERANCE 08/15/2008  . Headache(784.0) 11/13/2009  . HERNIA, UMBILICAL W/OBSTRUCTION W/O GANGRENE 06/28/2007  . HTN (hypertension) 07/12/2012  . HYPERLIPIDEMIA 06/28/2007  . HYPERTENSION 06/28/2007  . Impaired glucose tolerance 04/13/2011  . INSOMNIA-SLEEP DISORDER-UNSPEC 10/10/2008  . LEG PAIN, BILATERAL 02/16/2009  . LOW BACK PAIN 06/28/2007  . MASTITIS 01/10/2009  . NECK PAIN 05/31/2008  . NEPHROLITHIASIS, HX OF 06/28/2007  . PROCTOSIGMOIDITIS, ULCERATIVE 1997  . PVD (peripheral vascular disease) with claudication, lifestyle limiting, ABI rt. 0.67, lt. 0.73 07/12/2012  . S/P angioplasty with stent, after DB athrectomy to Rt. ext. iliac and Rt. SFA 07/12/2012  . Skin cancer    right  hand  . Spinal stenosis at L4-L5 level   . Stenosis of left carotid artery, mod to severe by dopplers 09/29/2012  . TESTICULAR PAIN, RIGHT 10/25/2010  . VITAMIN D DEFICIENCY 11/13/2009    Medications:  Scheduled:  . albuterol  2 puff Inhalation Q6H  . clopidogrel  75 mg Oral Daily  . methylPREDNISolone (SOLU-MEDROL) injection  40 mg Intravenous Q6H   Followed by  . diphenhydrAMINE  50 mg Oral Once  . enoxaparin (LOVENOX) injection  100 mg Subcutaneous Q12H  . gabapentin  100 mg Oral QHS  . mesalamine  1.2 g Oral QHS  . mometasone-formoterol  2 puff Inhalation BID  . pantoprazole  40 mg Oral Daily  . cyanocobalamin  1,000 mcg Oral Daily    Assessment: 3 YOF re-admitted with worsening respiratory symptoms from COVID-19. He had an acute worsening in respiratory status overnight. Pharmacy consulted to start treatment dose Lovenox due to concern for VTE. D-dimer 2.9>5.19  H/H and Plt wnl. SCr wnl. LE dopplers ordered   Goal of Therapy:  Anti-Xa level 0.6-1 units/ml 4hrs after LMWH dose given Monitor platelets by anticoagulation protocol: Yes   Plan:  -Lovenox 100 mg (1 mg/kg) twice daily  -Monitor CBC and renal fx  -F/u LE dopplers   Albertina Parr, PharmD., BCPS Clinical Pharmacist Clinical phone for 06/08/19 until 5pm: [phone]

## 2019-06-08 NOTE — Progress Notes (Signed)
Venous duplex lower ext  has been completed. Refer to [facility] under chart review to view preliminary results.   06/08/2019  4:10 PM Averyana Pillars, Bonnye Fava

## 2019-06-08 NOTE — Evaluation (Signed)
Physical Therapy Evaluation Patient Details Name: [name] [name] MRN: [id number] DOB: 01/17/1941 Today's Date: 06/08/2019   History of Present Illness   78 y.o. M, PVD s/p right fem endarterectomy and stent, Ulcerative colitis, Barrett's esophagus, GERD,HLD, HyerK, HypoNa and HTN who presented (9/18) with N/V/D for several weeks. Was admitted to [facility] for COVID tx and d/c home 09/28, states he was only home few hours prior to return to ED with hypoxia on 09/28.  Clinical Impression   Pt admitted with above diagnosis. Pt currently with functional limitations due to the deficits listed below (see PT Problem List). Pt will benefit from skilled PT to increase their activity tolerance, strength, independence and safety with mobility to allow discharge to the venue listed below.       Follow Up Recommendations Home health PT;SNF    Equipment Recommendations  (TBD)    Recommendations for Other Services OT consult     Precautions / Restrictions Precautions Precautions: Other (comment)(desats ) Precaution Comments: Desats with activity Restrictions Weight Bearing Restrictions: No      Mobility  Bed Mobility Overal bed mobility: Modified Independent             General bed mobility comments: Pt was OOB in the recliner chair.   Transfers Overall transfer level: Needs assistance Equipment used: None Transfers: Sit to/from Stand Sit to Stand: Modified independent (Device/Increase time)         General transfer comment: takes increased time but able to complete without extrenal assist  Ambulation/Gait Ambulation/Gait assistance: Supervision Gait Distance (Feet): 30 Feet Assistive device: None Gait Pattern/deviations: Staggering right     General Gait Details: noted LOB to R but able to self correct  Stairs            Wheelchair Mobility    Modified Rankin (Stroke Patients Only)       Balance Overall balance assessment: Needs assistance Sitting-balance  support: Feet supported Sitting balance-Leahy Scale: Good   Postural control: Other (comment)(good does not lean) Standing balance support: Single extremity supported Standing balance-Leahy Scale: Fair Standing balance comment: sightly decreased from last admission                             Pertinent Vitals/Pain Pain Assessment: 0-10 Pain Score: 4  Pain Location: in chest with deep breathing Pain Intervention(s): Limited activity within patient's tolerance;Monitored during session    [place] expects to be discharged to:: Private residence Living Arrangements: Children Available Help at Discharge: Family;Available PRN/intermittently Type of Home: House Home Access: Stairs to enter Entrance Stairs-Rails: Can reach both Entrance Stairs-Number of Steps: 2 Home Layout: One level Home Equipment: Walker - 2 wheels;Shower seat - built in;Hand held shower head;Grab bars - tub/shower Additional Comments: has RW but doesn't use it     Prior Function Level of Independence: Independent         Comments: [facility] just d/c home 2 days prior to readmit to this facility     Hand Dominance   Dominant Hand: Right    Extremity/Trunk Assessment   Upper Extremity Assessment Upper Extremity Assessment: Defer to OT evaluation    Lower Extremity Assessment Lower Extremity Assessment: Overall WFL for tasks assessed    Cervical / Trunk Assessment Cervical / Trunk Assessment: Normal  Communication   Communication: No difficulties  Cognition Arousal/Alertness: Awake/alert Behavior During Therapy: WFL for tasks assessed/performed Overall Cognitive Status: History of cognitive impairments - at baseline  General Comments: decreased STM, likes things written down      General Comments General comments (skin integrity, edema, etc.): Pt does well with mobility and is able to complete most tasks with SBA-mod I, he is  still quite anxious and does desat with actiivity into high 70s, Pt remained on 8L/min via HFNC monitored through [employer] forehead probe. He was able to ambulate shorter distance, he remarked on this but he has already started to be weaned off HFNC.    Exercises     Assessment/Plan    PT Assessment Patient needs continued PT services  PT Problem List Decreased strength;Decreased activity tolerance;Decreased balance;Decreased mobility;Decreased coordination;Decreased safety awareness       PT Treatment Interventions Gait training;Functional mobility training;Therapeutic activities;Therapeutic exercise;Balance training;Neuromuscular re-education;Patient/family education    PT Goals (Current goals can be found in the Care Plan section)  Acute Rehab PT Goals Patient Stated Goal: to be able to get around without losing his breath PT Goal Formulation: With patient Time For Goal Achievement: 2019-06-27 Potential to Achieve Goals: Fair    Frequency Min 3X/week   Barriers to discharge Other (comment)(recent d/c attempt and return to facility)      Co-evaluation               AM-PAC PT "6 Clicks" Mobility  Outcome Measure Help needed turning from your back to your side while in a flat bed without using bedrails?: A Little Help needed moving from lying on your back to sitting on the side of a flat bed without using bedrails?: A Little Help needed moving to and from a bed to a chair (including a wheelchair)?: A Little Help needed standing up from a chair using your arms (e.g., wheelchair or bedside chair)?: A Little Help needed to walk in hospital room?: A Little Help needed climbing 3-5 steps with a railing? : A Lot 6 Click Score: 17    End of Session Equipment Utilized During Treatment: Oxygen Activity Tolerance: Treatment limited secondary to medical complications (Comment);Patient limited by lethargy Patient left: in bed;with call bell/phone within reach Nurse Communication:  Mobility status PT Visit Diagnosis: Other abnormalities of gait and mobility (R26.89);Muscle weakness (generalized) (M62.81)    Time: 5747-3403 PT Time Calculation (min) (ACUTE ONLY): 31 min   Charges:   PT Evaluation $PT Eval Moderate Complexity: 1 Mod PT Treatments $Gait Training: 8-22 mins        Horald Chestnut, PT   Delford Field 06/08/2019, 5:12 PM

## 2019-06-08 NOTE — Plan of Care (Signed)
  Problem: Respiratory: Goal: Will maintain a patent airway Outcome: Progressing Goal: Complications related to the disease process, condition or treatment will be avoided or minimized Outcome: Progressing   

## 2019-06-08 NOTE — Progress Notes (Addendum)
PROGRESS NOTE  [name] [name]  DTO:[id number] DOB: 1941/06/23 DOA: 06/08/2019 PCP: Biagio Borg, MD  Outpatient Specialists: Vascular Surgery, Dr. Carlis Abbott; Interventional cardiology, Dr. Gwenlyn Found Brief Narrative: [name] [name] is a 78 y.o. male with a history of PVD, COPD, HTN, HLD, ulcerative colitis, and GERD with Barrett's esophagus who was initially admitted for covid 1/19 - 9/28. On discharge after receiving steroids, tocilizumab and remdesivir, he required 4L supplemental O2 and became more short of breath and hypoxic once he arrived home. He returned to the ED and was admitted 9/29 requiring 15L NRB with unchanged CXR infiltrates.   Assessment & Plan: Principal Problem:   COVID-19 virus infection Active Problems:   Essential hypertension   Chronic universal ulcerative colitis ([place])   Acute respiratory failure with hypoxia (HCC)   Elevated liver enzymes  Acute hypoxic respiratory failure due to covid-19 pneumonia: Received tocilizumab 9/25, remdesivir 9/19 - 9/23, and 10 days steroids on initial admission.Continues to be hypoxic with unchanged CXR.   - Resumed steroids though CRP has normalized and wish to minimize immunosuppression in patient who received tocilizumab, so will stop steroids following CT prep as he is not wheezing. - Continue vitamin C and zinc.  - Prone, IS, OOB encouraged - Avoid NSAIDs, remain euvolemic - Strongly suspect given atherosclerotic burden that peripheral pulse oximetry is bound to be less reliable.  Elevated d-dimer: With worsening hypoxia, pleuritic chest pain, acutely rising d-dimer despite diminution of other inflammatory markers/normalization of CRP, and recent hospitalization and covid-19 infection there is strong suspicion for PE. Unfortunately, CTA chest is complicated by contrast allergy and claustrophobia.  - Check LE dopplers - Check V/Q scan to attempt to avoid CT.  - Empiric therapeutic dose lovenox ordered, 59m/kg q12h. Continue plavix. No  evidence of bleeding or anemia.   Elevated LFTs: Worsened since recent admission, still < 5x ULN w/ALT predominance. ?If delayed effect of tocilizumab/remdesivir and/or covid-related (felt less likely) - Continue to trend. If continues rise, would get RUQ U/S.  COPD: No exacerbation/indication for steroids.  - Continue symbicort and prn BDs (MDI)  PVD: Extensive vasculopathy s/p stenting.  - Continue plavix  AKI: Improving.   HTN:  - Continue HCTZ, lisinopril  Hyponatremia: Suspect due to thiazide, ACEi.  - Monitor  GERD:  - Continue PPI  Ulcerative colitis: Quiescent.  - Continue mesalamine.   Obesity: BMI 34.   DVT prophylaxis: Lovenox Code Status: Full Family Communication: Daughter by phone during encounter. Will also call later today for extended discussion. Disposition Plan: PT, OT. Guarded prognosis.   Consultants:   None  Procedures:   None  Antimicrobials:  None   Subjective: Feels anterior chest pain with deep inspiration. Minimal cough, somewhat productive. No fevers, chills, leg swelling, orthopnea. No bleeding.  Objective: Vitals:   06/08/19 0500 06/08/19 0725 06/08/19 0805 06/08/19 0840  BP: 130/70 125/60 (!) 125/49 (!) 125/49  Pulse: (!) 57 65 72 84  Resp: 18 18 17  (!) 21  Temp: 97.6 F (36.4 C)  97.6 F (36.4 C)   TempSrc: Oral Oral Oral   SpO2: 98% 94% 95% 91%  Height: 5' 8"  (1.727 m)       Intake/Output Summary (Last 24 hours) at 06/08/2019 0955 Last data filed at 06/07/2019 1530 Gross per 24 hour  Intake 120 ml  Output 800 ml  Net -680 ml   There were no vitals filed for this visit.  Gen: Pleasant, elderly male on bedside chair Pulm: Non-labored tachypneic. Clear to auscultation bilaterally.  CV: Regular rate and rhythm. No murmur, rub, or gallop. No JVD, no pedal edema. PT pulses palpable bilaterally. GI: Abdomen soft, non-tender, non-distended, with normoactive bowel sounds. No organomegaly or masses felt. Ext: Warm, no  deformities Skin: No rashes, lesions or ulcers Neuro: Alert and oriented. No focal neurological deficits. Psych: Judgement and insight appear normal. Mood & affect appropriate.   Data Reviewed: I have personally reviewed following labs and imaging studies  CBC: Recent Labs  Lab 06/02/19 0145 05/20/2019 2354 06/08/19 0411  WBC 9.1 16.5* 13.9*  NEUTROABS 8.0* 13.9* 11.8*  HGB 13.0 15.8 14.6  HCT 38.5* 46.5 44.7  MCV 83.0 82.9 83.9  PLT 256 444* 416   Basic Metabolic Panel: Recent Labs  Lab 06/02/19 0145 05/25/2019 2358 06/08/19 0411  NA 134* 129* 132*  K 3.9 4.8 4.6  CL 103 99 102  CO2 20* 19* 22  GLUCOSE 119* 126* 102*  BUN 31* 49* 38*  CREATININE 0.93 1.32* 1.04  CALCIUM 8.1* 8.5* 8.5*  MG 2.0  --   --    GFR: Estimated Creatinine Clearance: 69.3 mL/min (by C-G formula based on SCr of 1.04 mg/dL). Liver Function Tests: Recent Labs  Lab 06/02/19 0145 05/23/2019 2358 06/08/19 0411  AST 27 71* 95*  ALT 29 116* 205*  ALKPHOS 60 71 67  BILITOT 0.9 1.2 0.9  PROT 5.6* 6.0* 5.6*  ALBUMIN 2.6* 2.9* 3.0*   No results for input(s): LIPASE, AMYLASE in the last 168 hours. No results for input(s): AMMONIA in the last 168 hours. Coagulation Profile: No results for input(s): INR, PROTIME in the last 168 hours. Cardiac Enzymes: No results for input(s): CKTOTAL, CKMB, CKMBINDEX, TROPONINI in the last 168 hours. BNP (last 3 results) Recent Labs    02/07/19 0954  PROBNP 57.0   HbA1C: No results for input(s): HGBA1C in the last 72 hours. CBG: No results for input(s): GLUCAP in the last 168 hours. Lipid Profile: No results for input(s): CHOL, HDL, LDLCALC, TRIG, CHOLHDL, LDLDIRECT in the last 72 hours. Thyroid Function Tests: No results for input(s): TSH, T4TOTAL, FREET4, T3FREE, THYROIDAB in the last 72 hours. Anemia Panel: Recent Labs    05/21/2019 0213  FERRITIN 291   Urine analysis:    Component Value Date/Time   COLORURINE YELLOW 02/07/2019 [place] 02/07/2019 0954   LABSPEC 1.020 02/07/2019 0954   PHURINE 6.0 02/07/2019 0954   GLUCOSEU NEGATIVE 02/07/2019 0954   HGBUR NEGATIVE 02/07/2019 0954   BILIRUBINUR NEGATIVE 02/07/2019 0954   KETONESUR NEGATIVE 02/07/2019 0954   PROTEINUR NEGATIVE 07/12/2018 1506   UROBILINOGEN 0.2 02/07/2019 0954   NITRITE NEGATIVE 02/07/2019 0954   LEUKOCYTESUR NEGATIVE 02/07/2019 0954   No results found for this or any previous visit (from the past 240 hour(s)).    Radiology Studies: Dg Chest Port 1 View  Result Date: 06/07/2019 CLINICAL DATA:  COVID-19 positive, recent hospital discharge, new onset atrial fibrillation, desaturating and cyanotic EXAM: PORTABLE CHEST 1 VIEW COMPARISON:  Radiograph 06/01/2019 FINDINGS: There are diffuse bilateral interstitial and airspace opacities throughout both lungs most confluent in the lung periphery and bases. Overall extent is similar to most recent comparison radiograph on September 23rd. Cardiomediastinal contours are unchanged from prior. No acute osseous or soft tissue abnormality. Degenerative changes are present in the imaged spine and shoulders. IMPRESSION: No interval change in diffuse bilateral interstitial and airspace opacities throughout both lungs, most confluent in the lung periphery and bases, consistent with multifocal pneumonia and known diagnosis of COVID-19. Electronically  Signed   By: Lovena Le M.D.   On: 06/07/2019 01:04    Scheduled Meds: . albuterol  2 puff Inhalation Q6H  . clopidogrel  75 mg Oral Daily  . methylPREDNISolone (SOLU-MEDROL) injection  40 mg Intravenous Q6H   Followed by  . diphenhydrAMINE  50 mg Oral Once  . enoxaparin (LOVENOX) injection  100 mg Subcutaneous Q12H  . gabapentin  100 mg Oral QHS  . mesalamine  1.2 g Oral QHS  . mometasone-formoterol  2 puff Inhalation BID  . pantoprazole  40 mg Oral Daily  . cyanocobalamin  1,000 mcg Oral Daily   Continuous Infusions:   LOS: 1 day   Time spent: 35 minutes.   Patrecia Pour, MD [facility] [email or web address] Password [email or web address] 06/08/2019, 9:55 AM

## 2019-06-09 ENCOUNTER — Inpatient Hospital Stay (HOSPITAL_COMMUNITY): Payer: Medicare Other

## 2019-06-09 DIAGNOSIS — R071 Chest pain on breathing: Secondary | ICD-10-CM | POA: Diagnosis not present

## 2019-06-09 DIAGNOSIS — J939 Pneumothorax, unspecified: Secondary | ICD-10-CM | POA: Diagnosis not present

## 2019-06-09 DIAGNOSIS — R0902 Hypoxemia: Secondary | ICD-10-CM

## 2019-06-09 LAB — COMPREHENSIVE METABOLIC PANEL
ALT: 265 U/L — ABNORMAL HIGH (ref 0–44)
AST: 97 U/L — ABNORMAL HIGH (ref 15–41)
Albumin: 3.1 g/dL — ABNORMAL LOW (ref 3.5–5.0)
Alkaline Phosphatase: 70 U/L (ref 38–126)
Anion gap: 8 (ref 5–15)
BUN: 34 mg/dL — ABNORMAL HIGH (ref 8–23)
CO2: 21 mmol/L — ABNORMAL LOW (ref 22–32)
Calcium: 8.5 mg/dL — ABNORMAL LOW (ref 8.9–10.3)
Chloride: 102 mmol/L (ref 98–111)
Creatinine, Ser: 0.98 mg/dL (ref 0.61–1.24)
GFR calc Af Amer: >60 mL/min (ref >60)
GFR calc non Af Amer: >60 mL/min (ref >60)
Glucose, Bld: 148 mg/dL — ABNORMAL HIGH (ref 70–99)
Potassium: 5 mmol/L (ref 3.5–5.1)
Sodium: 131 mmol/L — ABNORMAL LOW (ref 135–145)
Total Bilirubin: 0.7 mg/dL (ref 0.3–1.2)
Total Protein: 5.7 g/dL — ABNORMAL LOW (ref 6.5–8.1)

## 2019-06-09 LAB — CBC WITH DIFFERENTIAL/PLATELET
Abs Immature Granulocytes: 0.6 10*3/uL — ABNORMAL HIGH (ref 0.00–0.07)
Basophils Absolute: 0.1 10*3/uL (ref 0.0–0.1)
Basophils Relative: 0 %
Eosinophils Absolute: 0 10*3/uL (ref 0.0–0.5)
Eosinophils Relative: 0 %
HCT: 44.8 % (ref 39.0–52.0)
Hemoglobin: 15 g/dL (ref 13.0–17.0)
Immature Granulocytes: 4 %
Lymphocytes Relative: 1 %
Lymphs Abs: 0.2 10*3/uL — ABNORMAL LOW (ref 0.7–4.0)
MCH: 27.6 pg (ref 26.0–34.0)
MCHC: 33.5 g/dL (ref 30.0–36.0)
MCV: 82.5 fL (ref 80.0–100.0)
Monocytes Absolute: 0.2 10*3/uL (ref 0.1–1.0)
Monocytes Relative: 1 %
Neutro Abs: 12.5 10*3/uL — ABNORMAL HIGH (ref 1.7–7.7)
Neutrophils Relative %: 94 %
Platelets: 305 10*3/uL (ref 150–400)
RBC: 5.43 MIL/uL (ref 4.22–5.81)
RDW: 16.1 % — ABNORMAL HIGH (ref 11.5–15.5)
WBC: 13.6 10*3/uL — ABNORMAL HIGH (ref 4.0–10.5)
nRBC: 0 % (ref 0.0–0.2)

## 2019-06-09 LAB — SAMPLE TO BLOOD BANK

## 2019-06-09 LAB — TROPONIN I (HIGH SENSITIVITY)
Troponin I (High Sensitivity): 9 ng/L (ref <18)
Troponin I (High Sensitivity): 9 ng/L (ref <18)

## 2019-06-09 LAB — D-DIMER, QUANTITATIVE
D-Dimer, Quant: 2.69 ug/mL-FEU — ABNORMAL HIGH (ref 0.00–0.50)
D-Dimer, Quant: 3.08 ug/mL-FEU — ABNORMAL HIGH (ref 0.00–0.50)

## 2019-06-09 LAB — C-REACTIVE PROTEIN: CRP: <0.8 mg/dL (ref <1.0)

## 2019-06-09 MED ORDER — LIDOCAINE HCL 1 % IJ SOLN
20.0000 mL | Freq: Once | INTRAMUSCULAR | Status: AC
  Administered 2019-06-09: 20 mL
  Filled 2019-06-09: qty 20

## 2019-06-09 MED ORDER — FENTANYL CITRATE (PF) 100 MCG/2ML IJ SOLN
50.0000 ug | Freq: Once | INTRAMUSCULAR | Status: AC
  Administered 2019-06-09: 14:00:00 50 ug via INTRAVENOUS

## 2019-06-09 MED ORDER — CHLORHEXIDINE GLUCONATE CLOTH 2 % EX PADS
6.0000 | MEDICATED_PAD | Freq: Every day | CUTANEOUS | Status: DC
  Administered 2019-06-09 – 2019-06-21 (×13): 6 via TOPICAL

## 2019-06-09 MED ORDER — LIDOCAINE HCL (PF) 1 % IJ SOLN
30.0000 mL | Freq: Once | INTRAMUSCULAR | Status: DC | PRN
  Filled 2019-06-09: qty 30

## 2019-06-09 MED ORDER — MIDAZOLAM HCL 2 MG/2ML IJ SOLN
INTRAMUSCULAR | Status: AC
  Administered 2019-06-09: 1 mg via INTRAVENOUS
  Filled 2019-06-09: qty 2

## 2019-06-09 MED ORDER — MORPHINE SULFATE (PF) 2 MG/ML IV SOLN
INTRAVENOUS | Status: AC
  Administered 2019-06-09: 12:00:00 2 mg via INTRAVENOUS
  Filled 2019-06-09: qty 1

## 2019-06-09 MED ORDER — MORPHINE SULFATE (PF) 2 MG/ML IV SOLN
2.0000 mg | INTRAVENOUS | Status: DC | PRN
  Administered 2019-06-09 – 2019-06-16 (×18): 2 mg via INTRAVENOUS
  Filled 2019-06-09 (×17): qty 1

## 2019-06-09 MED ORDER — FENTANYL CITRATE (PF) 100 MCG/2ML IJ SOLN
INTRAMUSCULAR | Status: AC
  Administered 2019-06-09: 14:00:00 50 ug via INTRAVENOUS
  Filled 2019-06-09: qty 2

## 2019-06-09 MED ORDER — MIDAZOLAM HCL 2 MG/2ML IJ SOLN
2.0000 mg | Freq: Once | INTRAMUSCULAR | Status: AC
  Administered 2019-06-09: 14:00:00 1 mg via INTRAVENOUS

## 2019-06-09 NOTE — Progress Notes (Signed)
PROGRESS NOTE  [name] [name]  BZJ:[id number] DOB: 1940-12-01 DOA: 05/25/2019 PCP: Biagio Borg, MD  Outpatient Specialists: Vascular Surgery, Dr. Carlis Abbott; Interventional cardiology, Dr. Gwenlyn Found Brief Narrative: [name] [name] is a 78 y.o. male with a history of PVD, COPD, HTN, HLD, ulcerative colitis, and GERD with Barrett's esophagus who was initially admitted for covid 1/19 - 9/28. On discharge after receiving steroids, tocilizumab and remdesivir, he required 4L supplemental O2 and became more short of breath and hypoxic once he arrived home. He returned to the ED and was admitted 9/29 requiring 10L HFNC with unchanged CXR infiltrates. D-dimer was elevated from prior. Therapeutic anticoagulation was started after discussion with the patient and daughter while awaiting diagnostic studies. On 10/1, hypoxia has worsened, now on 15L NRB and the patient developed right-sided pleuritic chest pain.   Assessment & Plan: Principal Problem:   COVID-19 virus infection Active Problems:   Essential hypertension   Chronic universal ulcerative colitis ([place])   Acute respiratory failure with hypoxia (HCC)   Elevated liver enzymes  Acute hypoxic respiratory failure due to covid-19 pneumonia: Received tocilizumab 9/25, remdesivir 9/19 - 9/23, and 10 days steroids on initial admission.Continues to be hypoxic with unchanged CXR.   - Resumed steroids though CRP has normalized and wish to minimize immunosuppression in patient who received tocilizumab, so stopped steroids. - Continue vitamin C and zinc.  - Prone, IS, OOB encouraged - Avoid NSAIDs, remain euvolemic - Strongly suspect given atherosclerotic burden that peripheral pulse oximetry is bound to be less reliable.  Elevated d-dimer: With worsening hypoxia, pleuritic chest pain, acutely rising d-dimer despite diminution of other inflammatory markers/normalization of CRP, and recent hospitalization and covid-19 infection there is strong suspicion for PE.  Unfortunately, CTA chest is complicated by contrast allergy and claustrophobia. V/Q scan was ordered 9/30 in the morning, but was not able to be performed until 10/1 PM and would require transfer to nuclear medicine at [facility]. Given decompensation, do not feel transfer is prudent so will defer this. - LE dopplers negative - Empiric therapeutic dose lovenox ordered, 40m/kg q12h. Continue plavix. No evidence of bleeding or anemia.  - May need to pretreat w/steroids/benadryl and pursue CTA chest though currently do not feel he is stable enough to risk allergic reaction/respiratory depression possible from anxiolytic.   Chest pain: Severe, acute, right-sided radiating to the back. Worse with palpation and deep breathing. No significant hemodynamic changes. DDx most likely pleuritis/PE and/or chest wall pain (muscle strain, costochondiritis) with known covid pneumonia. Also possible includes ACS (no acute ECG changes, negative stress test 2018), COPD (no wheezing), pericarditis (atypical location, no rub), aortic dissection (no new hemodynamic changes or murmur), spontaneous PTX, GERD/esophageal spasm,  - CXR.  - Troponin, repeat d-dimer STAT.   - ECG STAT reviewed personally which shows stable early repolarization without ST elevation or depression, very similar to prior tracings.  - Morphine prn  Elevated LFTs: Worsened since recent admission though now appears to be reaching plateau. ?If delayed effect of tocilizumab/remdesivir and/or covid-related (felt less likely) - Continue to trend. If continues rise, would get RUQ U/S.  COPD: No exacerbation/indication for steroids.  - Continue symbicort and prn BDs (MDI)  PVD: Extensive vasculopathy s/p stenting.  - Continue plavix  AKI: Improving.   HTN:  - Continue HCTZ, lisinopril  Hyponatremia: Suspect due to thiazide, ACEi.  - Monitor  GERD:  - Continue PPI  Ulcerative colitis: Quiescent.  - Continue mesalamine.   Obesity: BMI 34.   DVT  prophylaxis: Lovenox  Code Status: Full code to remain in place. I've shared that I suspect mechanical ventilation would not appreciably improve outcome were respiratory distress to develop.  Family Communication: Will update daughter by phone. Disposition Plan: Transfer to ICU for closer monitoring, consideration of HHF. Guarded prognosis.   Consultants:   None  Procedures:   None  Antimicrobials:  None   Subjective: Felt more short of breath on initial rounds this morning, but no chest pain at that time. Around 11am, developed acute onset severe right sided-chest pain radiating between shoulder blades while seated not exerting himself. This is worse with pressing on that area and with deep breaths. He's never had this pain before.   Objective: Vitals:   06/08/19 1526 06/08/19 2006 06/09/19 0506 06/09/19 0803  BP: (!) 122/51 (!) 103/55 128/68 97/70  Pulse: 66 81 64 70  Resp: 18 16  (!) 21  Temp: 98 F (36.7 C) 98 F (36.7 C) 98.1 F (36.7 C) (!) 97.2 F (36.2 C)  TempSrc: Oral Oral Oral Axillary  SpO2: 93% 90% 91% (!) 89%  Height:        Intake/Output Summary (Last 24 hours) at 06/09/2019 1143 Last data filed at 06/09/2019 1102 Gross per 24 hour  Intake --  Output 1925 ml  Net -1925 ml   Exam updated: Gen: 78 y.o. male in moderate distress Pulm: Mildly labored tachypnea, clear bilaterally with breath sounds audible throughout. No wheezing. CV: Regular rate and rhythm. No murmur, rub, or gallop. No JVD, no dependent edema. GI: Abdomen soft, non-tender, non-distended, with normoactive bowel sounds.  Ext: Cool, dry, unchanged. Palpable radial pulses are symmetric. Skin: No new rashes, lesions or ulcers on visualized skin with attention to chest wall. Neuro: Alert and oriented. No focal neurological deficits. Psych: Judgement and insight appear fair. Mood anxious & affect congruent. Behavior is appropriate.    Data Reviewed: I have personally reviewed following labs and  imaging studies  CBC: Recent Labs  Lab 05/10/2019 2354 06/08/19 0411 06/09/19 0248  WBC 16.5* 13.9* 13.6*  NEUTROABS 13.9* 11.8* 12.5*  HGB 15.8 14.6 15.0  HCT 46.5 44.7 44.8  MCV 82.9 83.9 82.5  PLT 444* 347 253   Basic Metabolic Panel: Recent Labs  Lab 05/11/2019 2358 06/08/19 0411 06/09/19 0248  NA 129* 132* 131*  K 4.8 4.6 5.0  CL 99 102 102  CO2 19* 22 21*  GLUCOSE 126* 102* 148*  BUN 49* 38* 34*  CREATININE 1.32* 1.04 0.98  CALCIUM 8.5* 8.5* 8.5*   GFR: Estimated Creatinine Clearance: 73.6 mL/min (by C-G formula based on SCr of 0.98 mg/dL). Liver Function Tests: Recent Labs  Lab 06/05/2019 2358 06/08/19 0411 06/09/19 0248  AST 71* 95* 97*  ALT 116* 205* 265*  ALKPHOS 71 67 70  BILITOT 1.2 0.9 0.7  PROT 6.0* 5.6* 5.7*  ALBUMIN 2.9* 3.0* 3.1*   No results for input(s): LIPASE, AMYLASE in the last 168 hours. No results for input(s): AMMONIA in the last 168 hours. Coagulation Profile: No results for input(s): INR, PROTIME in the last 168 hours. Cardiac Enzymes: No results for input(s): CKTOTAL, CKMB, CKMBINDEX, TROPONINI in the last 168 hours. BNP (last 3 results) Recent Labs    02/07/19 0954  PROBNP 57.0   HbA1C: No results for input(s): HGBA1C in the last 72 hours. CBG: No results for input(s): GLUCAP in the last 168 hours. Lipid Profile: No results for input(s): CHOL, HDL, LDLCALC, TRIG, CHOLHDL, LDLDIRECT in the last 72 hours. Thyroid Function Tests: No results  for input(s): TSH, T4TOTAL, FREET4, T3FREE, THYROIDAB in the last 72 hours. Anemia Panel: No results for input(s): VITAMINB12, FOLATE, FERRITIN, TIBC, IRON, RETICCTPCT in the last 72 hours. Urine analysis:    Component Value Date/Time   COLORURINE YELLOW 02/07/2019 [place] 02/07/2019 0954   LABSPEC 1.020 02/07/2019 0954   PHURINE 6.0 02/07/2019 0954   GLUCOSEU NEGATIVE 02/07/2019 0954   HGBUR NEGATIVE 02/07/2019 0954   BILIRUBINUR NEGATIVE 02/07/2019 0954    KETONESUR NEGATIVE 02/07/2019 0954   PROTEINUR NEGATIVE 07/12/2018 1506   UROBILINOGEN 0.2 02/07/2019 0954   NITRITE NEGATIVE 02/07/2019 0954   LEUKOCYTESUR NEGATIVE 02/07/2019 0954   No results found for this or any previous visit (from the past 240 hour(s)).    Radiology Studies: Vas [place] Lower Extremity Venous (dvt)  Result Date: 06/08/2019  Lower Venous Study Indications: Covid + with elevated d-Dimer. Other Indications: History of bilateral LE bypass grafts. Comparison Study: No priors. Performing Technologist: Oda Cogan RDMS, RVT  Examination Guidelines: A complete evaluation includes B-mode imaging, spectral Doppler, color Doppler, and power Doppler as needed of all accessible portions of each vessel. Bilateral testing is considered an integral part of a complete examination. Limited examinations for reoccurring indications may be performed as noted.  +---------+---------------+---------+-----------+----------+--------------+  RIGHT     Compressibility Phasicity Spontaneity Properties Thrombus Aging  +---------+---------------+---------+-----------+----------+--------------+  CFV       Full            Yes       Yes                                    +---------+---------------+---------+-----------+----------+--------------+  SFJ       Full                                                             +---------+---------------+---------+-----------+----------+--------------+  FV Prox   Full                                                             +---------+---------------+---------+-----------+----------+--------------+  FV Mid    Full                                                             +---------+---------------+---------+-----------+----------+--------------+  FV Distal Full                                                             +---------+---------------+---------+-----------+----------+--------------+  PFV       Full                                                              +---------+---------------+---------+-----------+----------+--------------+  POP       Full            Yes       Yes                                    +---------+---------------+---------+-----------+----------+--------------+  PTV       Full                                                             +---------+---------------+---------+-----------+----------+--------------+  PERO      Full                                                             +---------+---------------+---------+-----------+----------+--------------+   +---------+---------------+---------+-----------+----------+--------------+  LEFT      Compressibility Phasicity Spontaneity Properties Thrombus Aging  +---------+---------------+---------+-----------+----------+--------------+  CFV       Full            Yes       Yes                                    +---------+---------------+---------+-----------+----------+--------------+  SFJ       Full                                                             +---------+---------------+---------+-----------+----------+--------------+  FV Prox   Full                                                             +---------+---------------+---------+-----------+----------+--------------+  FV Mid    Full                                                             +---------+---------------+---------+-----------+----------+--------------+  FV Distal Full                                                             +---------+---------------+---------+-----------+----------+--------------+  PFV       Full                                                             +---------+---------------+---------+-----------+----------+--------------+  POP       Full            Yes       Yes                                    +---------+---------------+---------+-----------+----------+--------------+  PTV       Full                                                              +---------+---------------+---------+-----------+----------+--------------+  PERO      Full                                                             +---------+---------------+---------+-----------+----------+--------------+     Summary: Right: There is no evidence of deep vein thrombosis in the lower extremity. No cystic structure found in the popliteal fossa. Left: There is no evidence of deep vein thrombosis in the lower extremity. No cystic structure found in the popliteal fossa.  *See table(s) above for measurements and observations.    Preliminary     Scheduled Meds:  albuterol  2 puff Inhalation Q6H   clopidogrel  75 mg Oral Daily   enoxaparin (LOVENOX) injection  100 mg Subcutaneous Q12H   gabapentin  100 mg Oral QHS   mesalamine  1.2 g Oral QHS   mometasone-formoterol  2 puff Inhalation BID   pantoprazole  40 mg Oral Daily   cyanocobalamin  1,000 mcg Oral Daily   Continuous Infusions:   LOS: 2 days   Time spent: 35 minutes.  Patrecia Pour, MD [facility] [email or web address] Password [email or web address] 06/09/2019, 11:43 AM

## 2019-06-09 NOTE — Progress Notes (Signed)
Pt expressing feelings of hopelessness and frustration over slow medical progression and presence of chest tube. Emotional support provided. Refusing all medications except Ambien and gabapentin. Continuing to educate pt on importance of prescribed therapies.

## 2019-06-09 NOTE — Procedures (Signed)
Chest Tube Insertion Procedure Note  Indications:  Clinically significant Pneumothorax  Pre-operative Diagnosis: Pneumothorax  Post-operative Diagnosis: Pneumothorax  Procedure Details  Informed consent was obtained for the procedure, including sedation.  Risks of lung perforation, hemorrhage, arrhythmia, and adverse drug reaction were discussed.   After sterile skin prep, using standard technique, a 14 French tube was placed in the right lateral 6th rib space.  Findings: Air and some serosang. Drainage   Estimated Blood Loss:  Minimal         Specimens:  None              Complications:  None; patient tolerated the procedure well.          Disposition: ICU - extubated and stable. cxr pending. sats remains stable throughout entire procedure         Condition: stable  Attending Attestation: I performed the procedure.

## 2019-06-09 NOTE — Plan of Care (Signed)

## 2019-06-09 NOTE — Consult Note (Addendum)
NAME:  [name] [name], MRN:  [id number], DOB:  [birth year], LOS: 2 ADMISSION DATE:  05/11/2019, CONSULTATION DATE:  06-19-19 REFERRING MD:  Dr Lamount Cohen, CHIEF COMPLAINT:  Chest pain on R   Brief History   78 yo cm with pmh peripheral vascular disease readmitted with covid after original discharge  The day prior 2/2 hypoxia. This am developed sudden onset, shooting pain on R side.  History of present illness   78 yo cm who was originally admitted on 9/19 with covid-19 pneumonia, treated and discharged on 9/28 on 4L [place] after having received steroids (completed 9/28), remdesivir and tocilizumab. He presented again the following day with worsening resp symptoms and hypoxia to 60-80's. Pt was admitted again to [facility] with hypoxemia. He was started on empiric tx dosing lovenox for suspected PE with ddimer >5. A ctpa was not obtained due to contrast allergy. Pt seemed to be stable on decreasing oxygen (down to 6L) when this am began having sudden onset, sharp, stabbing pain on the R side. He was increased to 15L, cxr was obtained and showed a R pneumothorax. He was transferred to the ICU for closer monitoring and chest tube placement.   At this time he appears comfortable with chest tube in place, on 14L with sats in the high 90's. No other complaints but tenderness at the site of insertion and sharp pain with deep inspiration (post placement cxr pending).  Past Medical History   Past Medical History:  Diagnosis Date  . Abdominal pain, unspecified site 10/16/2009  . Anal fissure 04/24/2008  . Arthritis   . B12 deficiency 10/11/2014  . BACK PAIN 01/10/2009  . Barrett's esophagus 1997  . BARRETTS ESOPHAGUS 08/16/2007  . BREAST HYPERTROPHY 02/16/2009  . CARBUNCLE, NECK 02/14/2008  . Cataract   . DIVERTICULOSIS, COLON 06/28/2007  . Full dentures   . GERD 06/28/2007  . GLUCOSE INTOLERANCE 08/15/2008  . Headache(784.0) 11/13/2009  . HERNIA, UMBILICAL W/OBSTRUCTION W/O GANGRENE 06/28/2007  . HTN (hypertension) 07/12/2012   . HYPERLIPIDEMIA 06/28/2007  . HYPERTENSION 06/28/2007  . Impaired glucose tolerance 04/13/2011  . INSOMNIA-SLEEP DISORDER-UNSPEC 10/10/2008  . LEG PAIN, BILATERAL 02/16/2009  . LOW BACK PAIN 06/28/2007  . MASTITIS 01/10/2009  . NECK PAIN 05/31/2008  . NEPHROLITHIASIS, HX OF 06/28/2007  . PROCTOSIGMOIDITIS, ULCERATIVE 1997  . PVD (peripheral vascular disease) with claudication, lifestyle limiting, ABI rt. 0.67, lt. 0.73 07/12/2012  . S/P angioplasty with stent, after DB athrectomy to Rt. ext. iliac and Rt. SFA 07/12/2012  . Skin cancer    right hand  . Spinal stenosis at L4-L5 level   . Stenosis of left carotid artery, mod to severe by dopplers 09/29/2012  . TESTICULAR PAIN, RIGHT 10/25/2010  . VITAMIN D DEFICIENCY 11/13/2009    Significant Hospital Events   9/28 d/c'd from [facility] on 4L 9/29: readmitted with worsening hypoxemia 06/19/23: brother died in [place] from Covid and sudden onset cp with ptx found.   Consults:  19-Jun-2023 CCM  Procedures:  06-19-23 R 14Fr chest tube placed  Significant Diagnostic Tests:  9/30 LE doppler:  Right: There is no evidence of deep vein thrombosis in the lower extremity. No cystic structure found in the popliteal fossa. Left: There is no evidence of deep vein thrombosis in the lower extremity. No cystic structure found in the popliteal fossa.     Micro Data:  9/19 sars2: POSITIVE  Antimicrobials:  none  Interim history/subjective:  10/1: sudden onset R chest pain warranting transfer to ICU. Chest tube placed.  Objective   Blood pressure 102/61, pulse 63, temperature 98.3 F (36.8 C), temperature source Axillary, resp. rate (!) 24, height 5' 8"  (1.727 m), SpO2 96 %.        Intake/Output Summary (Last 24 hours) at 06/09/2019 1459 Last data filed at 06/09/2019 1342 Gross per 24 hour  Intake -  Output 1975 ml  Net -1975 ml   There were no vitals filed for this visit.  Examination: General: no acute distress, reclining comfortably in bed HEENT: NCAT, EOMI,  PERRLA, MMMP Lungs: CTA on L, diminished on R, no wheezes, rhonchi, rales, R chest tube in place Cardiovascular: RRR, no m/g/r Abdomen: soft, NT,ND, BS+ Extremities: trace edema Skin: no rashes, warm and dry Neuro: moves all 4 extremities   Resolved Hospital Problem list     Assessment & Plan:  covid 19 pnuemonia Acute hypoxemic resp failure Acute spontaneous R pneumothorax:  -titrate oxygen as able -s/p R chest tube -repeat cxr pending from placement -follow exam and serial cxr for resolution then removal -pulm hygiene, self prone as able -suspect we can decrease lovenox dosing once ddimer is completed.  -cannot get ctap 2/2 dye allergy but LE dopplers negative.  -risks of chest tube placement explained in great detail to pt esp in pt on full dose a/c and antiplts that he received this am. D/w him risks including but not limited to bleed, tissue damage, need for toher procedures, infection, scarring. Increased risk of bleed also discussed. Pt was amendable to proceeding.   Acute chest pain:  More likely 2/2 acute ptx -trop negative and ekg without acute changes  Hyponatremia ARF: improving PVD  Best practice:  Diet: cardiac Pain/Anxiety/Delirium protocol (if indicated): miorphine prn VAP protocol (if indicated): n/a DVT prophylaxis: lovenox GI prophylaxis: ppi Glucose control: n/a Mobility: bedrest Code Status: full Family Communication: with pt Disposition: FULL  Labs   CBC: Recent Labs  Lab 05/20/2019 2354 06/08/19 0411 06/09/19 0248  WBC 16.5* 13.9* 13.6*  NEUTROABS 13.9* 11.8* 12.5*  HGB 15.8 14.6 15.0  HCT 46.5 44.7 44.8  MCV 82.9 83.9 82.5  PLT 444* 347 654    Basic Metabolic Panel: Recent Labs  Lab 05/21/2019 2358 06/08/19 0411 06/09/19 0248  NA 129* 132* 131*  K 4.8 4.6 5.0  CL 99 102 102  CO2 19* 22 21*  GLUCOSE 126* 102* 148*  BUN 49* 38* 34*  CREATININE 1.32* 1.04 0.98  CALCIUM 8.5* 8.5* 8.5*   GFR: Estimated Creatinine Clearance:  73.6 mL/min (by C-G formula based on SCr of 0.98 mg/dL). Recent Labs  Lab 05/14/2019 2354 05/27/2019 2358 06/08/19 0411 06/09/19 0248  PROCALCITON  --  <0.10  --   --   WBC 16.5*  --  13.9* 13.6*    Liver Function Tests: Recent Labs  Lab 06/07/2019 2358 06/08/19 0411 06/09/19 0248  AST 71* 95* 97*  ALT 116* 205* 265*  ALKPHOS 71 67 70  BILITOT 1.2 0.9 0.7  PROT 6.0* 5.6* 5.7*  ALBUMIN 2.9* 3.0* 3.1*   No results for input(s): LIPASE, AMYLASE in the last 168 hours. No results for input(s): AMMONIA in the last 168 hours.  ABG    Component Value Date/Time   TCO2 21 (L) 05/13/2018 0925     Coagulation Profile: No results for input(s): INR, PROTIME in the last 168 hours.  Cardiac Enzymes: No results for input(s): CKTOTAL, CKMB, CKMBINDEX, TROPONINI in the last 168 hours.  HbA1C: Hgb A1c MFr Bld  Date/Time Value Ref Range Status  02/07/2019 09:54  AM 5.4 4.6 - 6.5 % Final    Comment:    Glycemic Control Guidelines for People with Diabetes:Non Diabetic:  <6%Goal of Therapy: <7%Additional Action Suggested:  >8%   12/28/2017 08:59 AM 5.9 4.6 - 6.5 % Final    Comment:    Glycemic Control Guidelines for People with Diabetes:Non Diabetic:  <6%Goal of Therapy: <7%Additional Action Suggested:  >8%     CBG: No results for input(s): GLUCAP in the last 168 hours.  Review of Systems:   As per HPI, R chest pain, somewhat reproducible. Worse with inspiration, nothing improves it. Came on suddenly. So n/v/D, no hypoxia but + sob   Past Medical History  He,  has a past medical history of Abdominal pain, unspecified site (10/16/2009), Anal fissure (04/24/2008), Arthritis, B12 deficiency (10/11/2014), BACK PAIN (01/10/2009), Barrett's esophagus (1997), BARRETTS ESOPHAGUS (08/16/2007), BREAST HYPERTROPHY (02/16/2009), CARBUNCLE, NECK (02/14/2008), Cataract, DIVERTICULOSIS, COLON (06/28/2007), Full dentures, GERD (06/28/2007), GLUCOSE INTOLERANCE (08/15/2008), Headache(784.0) (02/13/5915), HERNIA,  UMBILICAL W/OBSTRUCTION W/O GANGRENE (06/28/2007), HTN (hypertension) (07/12/2012), HYPERLIPIDEMIA (06/28/2007), HYPERTENSION (06/28/2007), Impaired glucose tolerance (04/13/2011), INSOMNIA-SLEEP DISORDER-UNSPEC (10/10/2008), LEG PAIN, BILATERAL (02/16/2009), LOW BACK PAIN (06/28/2007), MASTITIS (01/10/2009), NECK PAIN (05/31/2008), NEPHROLITHIASIS, HX OF (06/28/2007), PROCTOSIGMOIDITIS, ULCERATIVE (1997), PVD (peripheral vascular disease) with claudication, lifestyle limiting, ABI rt. 0.67, lt. 0.73 (07/12/2012), S/P angioplasty with stent, after DB athrectomy to Rt. ext. iliac and Rt. SFA (07/12/2012), Skin cancer, Spinal stenosis at L4-L5 level, Stenosis of left carotid artery, mod to severe by dopplers (09/29/2012), TESTICULAR PAIN, RIGHT (10/25/2010), and VITAMIN D DEFICIENCY (11/13/2009).   Surgical History    Past Surgical History:  Procedure Laterality Date  . ABDOMINAL AORTOGRAM N/A 05/13/2018   Procedure: ABDOMINAL AORTOGRAM;  Surgeon: [name], MD;  Location: [place] [facility];  Service: Cardiovascular;  Laterality: N/A;  . anal fissure surgury     pt unsure of this  . ATHERECTOMY  09/28/2012  . ATHERECTOMY N/A 07/12/2012   Procedure: ATHERECTOMY;  Surgeon: [name], MD;  Location: [facility];  Service: Cardiovascular;  Laterality: N/A;  . ATHERECTOMY N/A 09/28/2012   Procedure: ATHERECTOMY;  Surgeon: [name], MD;  Location: [facility] CATH LAB;  Service: Cardiovascular;  Laterality: N/A;  . bilateral inguinal hernia with mesh and umbilical hernia repairs  09/2007  . CATARACT EXTRACTION Bilateral    bilateral  . COLONOSCOPY    . ENDARTERECTOMY FEMORAL Right 07/19/2018   Procedure: ENDARTERECTOMY RIGHT FEMORAL ARTERY;  Surgeon: [name], MD;  Location: [place];  Service: Vascular;  Laterality: Right;  . GROIN DISSECTION Right 07/20/2013   Procedure: GROIN EXPLORATION, EXCISIONAL BIOPSY RIGHT INGUINAL LYMPH NODES;  Surgeon: Harl Bowie, MD;  Location: [place];  Service: General;  Laterality: Right;  . HERNIA REPAIR    . left knee arthroscopy    . LOWER EXTREMITY ANGIOGRAPHY Bilateral 05/13/2018   Procedure: Lower Extremity Angiography;  Surgeon: [name], MD;  Location: [place] [facility];  Service: Cardiovascular;  Laterality: Bilateral;  . LUMBAR LAMINECTOMY/DECOMPRESSION MICRODISCECTOMY N/A 11/05/2016   Procedure: Microlumbar decompression L2-3, L3-4, L4-5, lateral mass fusion L4-5;  Surgeon: Susa Day, MD;  Location: [facility];  Service: Orthopedics;  Laterality: N/A;  . PATCH ANGIOPLASTY Right 07/19/2018   Procedure: RIGHT FEMORAL PATCH ANGIOPLASTY WITH XENOSURE BIOLOGIC PATCH;  Surgeon: [name], MD;  Location: [place];  Service: Vascular;  Laterality: Right;  . PERCUTANEOUS STENT INTERVENTION  09/28/2012   Procedure: PERCUTANEOUS STENT INTERVENTION;  Surgeon: [name], MD;  Location: [facility]  CATH LAB;  Service: Cardiovascular;;  . PERIPHERAL VASCULAR INTERVENTION  05/13/2018   Procedure: PERIPHERAL VASCULAR INTERVENTION;  Surgeon: [name], MD;  Location: [place] [facility];  Service: Cardiovascular;;  Ext. Iliac  . UPPER GI ENDOSCOPY       Social History   reports that he quit smoking about 46 years ago. He has quit using smokeless tobacco.  His smokeless tobacco use included chew. He reports previous alcohol use of about 5.0 standard drinks of alcohol per week. He reports that he does not use drugs.   Family History   His family history includes COPD in his father and mother; Cerebral aneurysm in his mother. There is no history of Colon cancer, Esophageal cancer, Rectal cancer, Stomach cancer, or Colon polyps.   Allergies Allergies  Allergen Reactions  . Wasp Venom Anaphylaxis  . Contrast Media [Iodinated Diagnostic Agents] Hives    Break out in welts.  Needs 13-hr prep.  . Statins Other (See Comments)    Legs pain: Atorvastatin and Lovastatin     Home Medications  Prior to Admission  medications   Medication Sig Start Date End Date Taking? Authorizing Provider  albuterol (VENTOLIN HFA) 108 (90 Base) MCG/ACT inhaler Inhale 2 puffs into the lungs every 6 (six) hours as needed for wheezing or shortness of breath. 01/28/19  Yes Biagio Borg, MD  allopurinol (ZYLOPRIM) 100 MG tablet Take 2 tablets (200 mg total) by mouth daily. 05/12/18  Yes Lyndal Pulley, DO  budesonide-formoterol (SYMBICORT) 160-4.5 MCG/ACT inhaler Inhale 2 puffs into the lungs 2 (two) times daily. 01/28/19  Yes Biagio Borg, MD  clopidogrel (PLAVIX) 75 MG tablet TAKE 1 TABLET BY MOUTH  DAILY Patient taking differently: Take 75 mg by mouth daily.  11/25/18  Yes Biagio Borg, MD  cyanocobalamin 1000 MCG tablet Take 1,000 mcg by mouth daily.    Yes [provider]  gabapentin (NEURONTIN) 100 MG capsule Take 1 capsule (100 mg total) by mouth at bedtime. 04/29/19  Yes Hulan Saas M, DO  lisinopril-hydrochlorothiazide (PRINZIDE,ZESTORETIC) 20-25 MG tablet TAKE 1 TABLET BY MOUTH  DAILY 11/25/18  Yes Biagio Borg, MD  mesalamine (APRISO) 0.375 g 24 hr capsule Take 4 capsules (1.5 g total) by mouth daily. Take 4 tablets all at one time. Patient taking differently: Take 1.5 g by mouth at bedtime. Take 4 tablets all at one time. 02/08/19  Yes Ladene Artist, MD  pantoprazole (PROTONIX) 40 MG tablet Take 1 tablet (40 mg total) by mouth daily. 02/08/19  Yes Ladene Artist, MD  zolpidem (AMBIEN) 10 MG tablet TAKE 1 TABLET BY MOUTH AT BEDTIME AS NEEDED FOR SLEEP. Patient taking differently: Take 10 mg by mouth at bedtime.  03/02/19  Yes Biagio Borg, MD     Critical care time: The patient is critically ill with multiple organ systems failure and requires high complexity decision making for assessment and support, frequent evaluation and titration of therapies, application of advanced monitoring technologies and extensive interpretation of multiple databases.  Critical care time 33 mins. This represents my time  independent of the NP's/PA's/med students/residents time taking care of the pt. This is excluding procedures.     Audria Nine DO Pager: [phone] After hours pager: [phone]  [facility] 06/09/2019, 2:59 PM

## 2019-06-09 NOTE — Progress Notes (Signed)
Pt reported 10 out of 10 pain to the right upper chest that radiated to his back. At this time I called a rapid response. The rapid response team and Dr. Bonner Puna came to the patient's room to assess the patient. An EKG was performed as well as morphine ordered. At this time Dr. Bonner Puna decided to transfer the patient to the ICU. The morphine was administered and the patient was transferred according to orders.

## 2019-06-09 NOTE — Progress Notes (Signed)
OT Cancellation Note  Patient Details Name: [name] [name] MRN: [id number] DOB: [birth year]   Cancelled Treatment:    Reason Eval/Treat Not Completed: Medical issues which prohibited therapy. Pt with medical decline and transferred to ICU. Will follow and assess when medically appropriate.    Sumeya Yontz,[name] 06/09/2019, 2:08 PM  Maurie Boettcher, OT/L   Acute OT Clinical Specialist Acute Rehabilitation Services Pager [phone] Office [phone]

## 2019-06-09 NOTE — Progress Notes (Signed)
Called down to room 130 for a rapid response on patient. Patient c/o sudden onset sharp right sided chest pain radiating through to back. Dr.Grunz at bedside, 12 lead EKG done, C-Xray, and labs. Patient on 15L [place] O2 with sats 93%, BP and HR stable. Patient transferred to ICU, Chest X-ray showed right side pneumothorax, chest tube placed at bedside in ICU by Dr.Marshall.

## 2019-06-09 DEATH — deceased

## 2019-06-10 DIAGNOSIS — U071 COVID-19: Secondary | ICD-10-CM | POA: Diagnosis present

## 2019-06-10 DIAGNOSIS — J1282 Pneumonia due to coronavirus disease 2019: Secondary | ICD-10-CM | POA: Diagnosis present

## 2019-06-10 DIAGNOSIS — J9311 Primary spontaneous pneumothorax: Secondary | ICD-10-CM | POA: Diagnosis not present

## 2019-06-10 DIAGNOSIS — J449 Chronic obstructive pulmonary disease, unspecified: Secondary | ICD-10-CM | POA: Diagnosis present

## 2019-06-10 DIAGNOSIS — K5190 Ulcerative colitis, unspecified, without complications: Secondary | ICD-10-CM | POA: Diagnosis present

## 2019-06-10 DIAGNOSIS — J939 Pneumothorax, unspecified: Secondary | ICD-10-CM

## 2019-06-10 DIAGNOSIS — R0902 Hypoxemia: Secondary | ICD-10-CM | POA: Diagnosis not present

## 2019-06-10 LAB — COMPREHENSIVE METABOLIC PANEL
ALT: 367 U/L — ABNORMAL HIGH (ref 0–44)
AST: 137 U/L — ABNORMAL HIGH (ref 15–41)
Albumin: 3.1 g/dL — ABNORMAL LOW (ref 3.5–5.0)
Alkaline Phosphatase: 66 U/L (ref 38–126)
Anion gap: 8 (ref 5–15)
BUN: 32 mg/dL — ABNORMAL HIGH (ref 8–23)
CO2: 22 mmol/L (ref 22–32)
Calcium: 8.6 mg/dL — ABNORMAL LOW (ref 8.9–10.3)
Chloride: 103 mmol/L (ref 98–111)
Creatinine, Ser: 0.9 mg/dL (ref 0.61–1.24)
GFR calc Af Amer: >60 mL/min (ref >60)
GFR calc non Af Amer: >60 mL/min (ref >60)
Glucose, Bld: 93 mg/dL (ref 70–99)
Potassium: 4.7 mmol/L (ref 3.5–5.1)
Sodium: 133 mmol/L — ABNORMAL LOW (ref 135–145)
Total Bilirubin: 1.1 mg/dL (ref 0.3–1.2)
Total Protein: 5.7 g/dL — ABNORMAL LOW (ref 6.5–8.1)

## 2019-06-10 LAB — CBC WITH DIFFERENTIAL/PLATELET
Abs Immature Granulocytes: 0.4 10*3/uL — ABNORMAL HIGH (ref 0.00–0.07)
Basophils Absolute: 0.1 10*3/uL (ref 0.0–0.1)
Basophils Relative: 1 %
Eosinophils Absolute: 0.1 10*3/uL (ref 0.0–0.5)
Eosinophils Relative: 1 %
HCT: 45.8 % (ref 39.0–52.0)
Hemoglobin: 15.3 g/dL (ref 13.0–17.0)
Immature Granulocytes: 4 %
Lymphocytes Relative: 4 %
Lymphs Abs: 0.4 10*3/uL — ABNORMAL LOW (ref 0.7–4.0)
MCH: 27.8 pg (ref 26.0–34.0)
MCHC: 33.4 g/dL (ref 30.0–36.0)
MCV: 83.1 fL (ref 80.0–100.0)
Monocytes Absolute: 0.5 10*3/uL (ref 0.1–1.0)
Monocytes Relative: 6 %
Neutro Abs: 7.6 10*3/uL (ref 1.7–7.7)
Neutrophils Relative %: 84 %
Platelets: 300 10*3/uL (ref 150–400)
RBC: 5.51 MIL/uL (ref 4.22–5.81)
RDW: 17.1 % — ABNORMAL HIGH (ref 11.5–15.5)
WBC: 9 10*3/uL (ref 4.0–10.5)
nRBC: 0 % (ref 0.0–0.2)

## 2019-06-10 LAB — C-REACTIVE PROTEIN: CRP: <0.8 mg/dL (ref <1.0)

## 2019-06-10 LAB — D-DIMER, QUANTITATIVE: D-Dimer, Quant: 2.43 ug/mL-FEU — ABNORMAL HIGH (ref 0.00–0.50)

## 2019-06-10 MED ORDER — BISACODYL 5 MG PO TBEC
5.0000 mg | DELAYED_RELEASE_TABLET | Freq: Two times a day (BID) | ORAL | Status: DC | PRN
  Administered 2019-06-18: 10:00:00 5 mg via ORAL
  Filled 2019-06-10: qty 1

## 2019-06-10 MED ORDER — ENOXAPARIN SODIUM 40 MG/0.4ML ~~LOC~~ SOLN
40.0000 mg | Freq: Two times a day (BID) | SUBCUTANEOUS | Status: DC
  Administered 2019-06-10 – 2019-06-16 (×12): 40 mg via SUBCUTANEOUS
  Filled 2019-06-10 (×12): qty 0.4

## 2019-06-10 NOTE — Progress Notes (Signed)
OT Cancellation Note  Patient Details Name: [name] MRN: [id number] DOB: 01/08/1941   Cancelled Treatment:    Reason Eval/Treat Not Completed: Other (comment). Pt being trasnferred form ICU to floor. Will attempt later this pm if able.  [name],[name] 06/10/2019, 2:47 PM  Maurie Boettcher, OT/L   Acute OT Clinical Specialist Acute Rehabilitation Services Pager [phone] Office [phone]

## 2019-06-10 NOTE — Progress Notes (Addendum)
NAME:  [name] [name], MRN:  [id number], DOB:  08-08-1941, LOS: 3 ADMISSION DATE:  05/12/2019, CONSULTATION DATE:  2019-06-11 REFERRING MD:  Dr Lamount Cohen, CHIEF COMPLAINT:  Chest pain on R   Brief History   78 yo cm with pmh peripheral vascular disease readmitted with covid after original discharge  The day prior 2/2 hypoxia. This am developed sudden onset, shooting pain on R side.   Past Medical History   Past Medical History:  Diagnosis Date  . Abdominal pain, unspecified site 10/16/2009  . Anal fissure 04/24/2008  . Arthritis   . B12 deficiency 10/11/2014  . BACK PAIN 01/10/2009  . Barrett's esophagus 1997  . BARRETTS ESOPHAGUS 08/16/2007  . BREAST HYPERTROPHY 02/16/2009  . CARBUNCLE, NECK 02/14/2008  . Cataract   . DIVERTICULOSIS, COLON 06/28/2007  . Full dentures   . GERD 06/28/2007  . GLUCOSE INTOLERANCE 08/15/2008  . Headache(784.0) 11/13/2009  . HERNIA, UMBILICAL W/OBSTRUCTION W/O GANGRENE 06/28/2007  . HTN (hypertension) 07/12/2012  . HYPERLIPIDEMIA 06/28/2007  . HYPERTENSION 06/28/2007  . Impaired glucose tolerance 04/13/2011  . INSOMNIA-SLEEP DISORDER-UNSPEC 10/10/2008  . LEG PAIN, BILATERAL 02/16/2009  . LOW BACK PAIN 06/28/2007  . MASTITIS 01/10/2009  . NECK PAIN 05/31/2008  . NEPHROLITHIASIS, HX OF 06/28/2007  . PROCTOSIGMOIDITIS, ULCERATIVE 1997  . PVD (peripheral vascular disease) with claudication, lifestyle limiting, ABI rt. 0.67, lt. 0.73 07/12/2012  . S/P angioplasty with stent, after DB athrectomy to Rt. ext. iliac and Rt. SFA 07/12/2012  . Skin cancer    right hand  . Spinal stenosis at L4-L5 level   . Stenosis of left carotid artery, mod to severe by dopplers 09/29/2012  . TESTICULAR PAIN, RIGHT 10/25/2010  . VITAMIN D DEFICIENCY 11/13/2009    Significant Hospital Events   9/28 d/c'd from [facility] on 4L 9/29: readmitted with worsening hypoxemia 06/11/2023: brother died in [place] from Covid and sudden onset cp with ptx found.   Consults:  06-11-23 CCM  Procedures:  June 11, 2023 R 14Fr chest tube  placed  Significant Diagnostic Tests:  9/30 LE doppler:  Right: There is no evidence of deep vein thrombosis in the lower extremity. No cystic structure found in the popliteal fossa. Left: There is no evidence of deep vein thrombosis in the lower extremity. No cystic structure found in the popliteal fossa.     Micro Data:  9/19 sars2: POSITIVE  Antimicrobials:  none  Interim history/subjective:  10/2: improved chest pain and oxygenation on 14L but was increased from 6L without documentation of oxygen <95%. Will cont to wean. At time of my exam we decreased him to 10L and sat remains 98 or greater. Stable for transfer from ICU to floor.  10/1: sudden onset R chest pain warranting transfer to ICU. Chest tube placed.   Objective   Blood pressure (!) 115/59, pulse 65, temperature 97.9 F (36.6 C), temperature source Oral, resp. rate (!) 21, height 5' 7.99" (1.727 m), weight 102.8 kg, SpO2 96 %.        Intake/Output Summary (Last 24 hours) at 06/10/2019 1342 Last data filed at 06/10/2019 1259 Gross per 24 hour  Intake -  Output 1138 ml  Net -1138 ml   Filed Weights   06/10/19 1223  Weight: 102.8 kg    Examination: General: no acute distress, reclining comfortably in bed HEENT: NCAT, EOMI, PERRLA, MMMP Lungs: CTA on B/L, no wheezes, rhonchi, rales, R chest tube in place Cardiovascular: RRR, no m/g/r Abdomen: soft, NT,ND, BS+ Extremities: trace edema Skin: no rashes, warm and  dry Neuro: moves all 4 extremities   Resolved Hospital Problem list     Assessment & Plan:  covid 19 pnuemonia Acute hypoxemic resp failure Acute spontaneous R pneumothorax:  -titrate oxygen as able -goal sat remains >90 % for now with rest -s/p R chest tube -cxr with persistent small ptx. Will increase suction to -40cmH20 for today.  -repeat cxr in am -follow exam and serial cxr for resolution then removal -pulm hygiene, self prone as able -will decrease lovenox from tx dose to prophylactic  dosing in pt with elevated ddimer. Suspect issues yesterday were 2/2 ptx rather than CTPA -cannot get ctap 2/2 dye allergy but LE dopplers negative.    Acute chest pain:  -resolved, More likely 2/2 acute ptx -trop negative and ekg without acute changes  Hyponatremia: improving ARF: improving PVD Transaminitis: follow trend perhaps 2/2 events yesterday with ptx and some heart strain 2/2 such, hopefully will improve, not on offending agents that I can appreciate on mar  Best practice:  Diet: cardiac Pain/Anxiety/Delirium protocol (if indicated): morphine prn VAP protocol (if indicated): n/a DVT prophylaxis: lovenox GI prophylaxis: ppi Glucose control: n/a Mobility: as able WITH SUCTION! Code Status: full Family Communication: with pt Disposition: stable for transfer to floor  Labs   CBC: Recent Labs  Lab 05/30/2019 2354 06/08/19 0411 06/09/19 0248 06/10/19 0000  WBC 16.5* 13.9* 13.6* 9.0  NEUTROABS 13.9* 11.8* 12.5* 7.6  HGB 15.8 14.6 15.0 15.3  HCT 46.5 44.7 44.8 45.8  MCV 82.9 83.9 82.5 83.1  PLT 444* 347 305 354    Basic Metabolic Panel: Recent Labs  Lab 05/14/2019 2358 06/08/19 0411 06/09/19 0248 06/10/19 0000  NA 129* 132* 131* 133*  K 4.8 4.6 5.0 4.7  CL 99 102 102 103  CO2 19* 22 21* 22  GLUCOSE 126* 102* 148* 93  BUN 49* 38* 34* 32*  CREATININE 1.32* 1.04 0.98 0.90  CALCIUM 8.5* 8.5* 8.5* 8.6*   GFR: Estimated Creatinine Clearance: 79.9 mL/min (by C-G formula based on SCr of 0.9 mg/dL). Recent Labs  Lab 05/15/2019 2354 05/22/2019 2358 06/08/19 0411 06/09/19 0248 06/10/19 0000  PROCALCITON  --  <0.10  --   --   --   WBC 16.5*  --  13.9* 13.6* 9.0    Liver Function Tests: Recent Labs  Lab 06/03/2019 2358 06/08/19 0411 06/09/19 0248 06/10/19 0000  AST 71* 95* 97* 137*  ALT 116* 205* 265* 367*  ALKPHOS 71 67 70 66  BILITOT 1.2 0.9 0.7 1.1  PROT 6.0* 5.6* 5.7* 5.7*  ALBUMIN 2.9* 3.0* 3.1* 3.1*   No results for input(s): LIPASE, AMYLASE in the  last 168 hours. No results for input(s): AMMONIA in the last 168 hours.  ABG    Component Value Date/Time   TCO2 21 (L) 05/13/2018 0925     Coagulation Profile: No results for input(s): INR, PROTIME in the last 168 hours.  Cardiac Enzymes: No results for input(s): CKTOTAL, CKMB, CKMBINDEX, TROPONINI in the last 168 hours.  HbA1C: Hgb A1c MFr Bld  Date/Time Value Ref Range Status  02/07/2019 09:54 AM 5.4 4.6 - 6.5 % Final    Comment:    Glycemic Control Guidelines for People with Diabetes:Non Diabetic:  <6%Goal of Therapy: <7%Additional Action Suggested:  >8%   12/28/2017 08:59 AM 5.9 4.6 - 6.5 % Final    Comment:    Glycemic Control Guidelines for People with Diabetes:Non Diabetic:  <6%Goal of Therapy: <7%Additional Action Suggested:  >8%     CBG: No  results for input(s): GLUCAP in the last 168 hours.    Critical care time: The patient is critically ill with multiple organ systems failure and requires high complexity decision making for assessment and support, frequent evaluation and titration of therapies, application of advanced monitoring technologies and extensive interpretation of multiple databases.  Care time 35 mins. This represents my time independent of the NP's/PA's/med students/residents time taking care of the pt. This is excluding procedures.     Audria Nine DO Pager: [phone] After hours pager: [phone]  [facility] 06/10/2019, 1:42 PM

## 2019-06-10 NOTE — Progress Notes (Signed)
  PT Cancellation Note  Patient Details Name: [name] [name] MRN: [id number] DOB: 1940/09/15   Cancelled Treatment:    Reason Eval/Treat Not Completed: Other (comment) pt. Being transferred to 1 W. Will check back another time.   Claretha Cooper 06/10/2019, 4:44 PM

## 2019-06-10 NOTE — Progress Notes (Signed)
Pt received from ICU per MD transfer order. Pt oriented to POC for rest of shift. All questions answered, assessment completed, no further concerns at this time.

## 2019-06-10 NOTE — Progress Notes (Addendum)
PROGRESS NOTE    [name] [name]  XKG:[id number] DOB: 05/24/1941 DOA: 05/23/2019 PCP: Biagio Borg, MD   Brief Narrative:  [name] [name] is a 78 y.o. WM PMHx,  PVD, COPD, HTN, HLD, ulcerative colitis, and GERD with Barrett's esophagus   Initially admitted for covid 1/19 - 9/28. On discharge after receiving steroids, tocilizumab and remdesivir, he required 4L supplemental O2 and became more short of breath and hypoxic once he arrived home. He returned to the ED and was admitted 9/29 requiring 10L HFNC with unchanged CXR infiltrates. D-dimer was elevated from prior. Therapeutic anticoagulation was started after discussion with the patient and daughter while awaiting diagnostic studies. On 10/1, hypoxia has worsened, now on 15L NRB and the patient developed right-sided pleuritic chest pain.    Subjective: A/O x4, positive CP at area of pigtail catheter insertion, positive S OB (improved from 10/1) negative abdominal pain, negative N/V,    Assessment & Plan:   Principal Problem:   COVID-19 virus infection Active Problems:   Essential hypertension   Chronic universal ulcerative colitis ([place])   Acute respiratory failure with hypoxia (HCC)   Elevated liver enzymes   Pneumonia due to COVID-19 virus   Pneumothorax, right   COPD (chronic obstructive pulmonary disease) ([place])   Ulcerative colitis ([place])  COVID-19 Pneumonia/Acute respiratory failure with hypoxia -Remdesivir 9/19-9/23 -Steroids x10 days on initial admission. - 9/25 Actemra -Transfer to the ICU secondary to increasing pleuritic chest pain and rising d-dimer, Concern for pulmonary embolus.  Patient was unable to obtain CTA or VQ scan secondary to equipment not being available at [facility].. -Lower extremity Dopplers negative -Empiric full dose Lovenox started. -PCXR showed RIGHT spontaneous pneumothorax - Respiratory status improved since placement of pigtail catheter.  RIGHT pneumothorax (spontaneous)  - Pigtail catheter  placed 10/1>>  Acute Pulmonary embolus - See COVID pneumonia  COPD - Negative aspiration - Albuterol PRN  Chest pain -Noncardiac - RIGHT sided radiating to back; secondary to spontaneous pneumothorax.  Essential HTN -Controlled, currently without BP medication. - Continue to monitor and restart BP medication if required.  PVD -Extensive vasculopathy, s/p stenting - Continue Plavix  Hyponatremia -Most likely secondary to patient's diuretics which have been stopped. - Very mild, asymptomatic, improving  Elevated LFT with normal bilirubin - Patient appears asymptomatic - May be secondary to patient's Actemra/Remdesivir treatment for COVID pneumonia. - If enzymes continue to climb or patient becomes symptomatic obtain RUQ [place]  AKI -Resolved  GERD - Protonix 40 mg daily  Ulcerative colitis - Mesalamine 1.2 g daily - Currently asymptomatic.  Obesity -BMI 34    DVT prophylaxis: Lovenox Code Status: Full Family Communication: 10/2 spoke with Timmothy Sours (daughter) counseled her on the plan of care answered all questions Disposition Plan: TBD   Consultants:  PCCM    Procedures/Significant Events:  RIGHT chest pigtail catheter placed 10/1>>    I have personally reviewed and interpreted all radiology studies and my findings are as above.  VENTILATOR SETTINGS:    Cultures   Antimicrobials: Anti-infectives (From admission, onward)   None       Devices    LINES / TUBES:  RIGHT side pigtail catheter10/1    Continuous Infusions:   Objective: Vitals:   06/10/19 1200 06/10/19 1223 06/10/19 1300 06/10/19 1400  BP: (!) 115/59  (!) 118/51 (!) 122/55  Pulse: 65  71 65  Resp: (!) 21  20 (!) 21  Temp:      TempSrc:      SpO2: 96%  93% 94%  Weight:  102.8 kg    Height:  5' 7.99" (1.727 m)      Intake/Output Summary (Last 24 hours) at 06/10/2019 1436 Last data filed at 06/10/2019 1259 Gross per 24 hour  Intake --  Output 1138 ml  Net -1138 ml    Filed Weights   06/10/19 1223  Weight: 102.8 kg    Examination:  General: A/O x4, positive acute respiratory distress (significant improved from 10/1) Eyes: negative scleral hemorrhage, negative anisocoria, negative icterus ENT: Negative Runny nose, negative gingival bleeding, Neck:  Negative scars, masses, torticollis, lymphadenopathy, JVD Lungs: Clear to auscultation bilaterally without wheezes or crackles, right chest wall pigtail catheter in place appropriate pain to palpation Cardiovascular: Regular rate and rhythm without murmur gallop or rub normal S1 and S2 Abdomen: negative abdominal pain, nondistended, positive soft, bowel sounds, no rebound, no ascites, no appreciable mass Extremities: No significant cyanosis, clubbing, or edema bilateral lower extremities Skin: Negative rashes, lesions, ulcers Psychiatric:  Negative depression, negative anxiety, negative fatigue, negative mania  Central nervous system:  Cranial nerves II through XII intact, tongue/uvula midline, all extremities muscle strength 5/5, sensation intact throughout, negative dysarthria, negative expressive aphasia, negative receptive aphasia.  .     Data Reviewed: Care during the described time interval was provided by me .  I have reviewed this patient's available data, including medical history, events of note, physical examination, and all test results as part of my evaluation.   CBC: Recent Labs  Lab 05/13/2019 2354 06/08/19 0411 06/09/19 0248 06/10/19 0000  WBC 16.5* 13.9* 13.6* 9.0  NEUTROABS 13.9* 11.8* 12.5* 7.6  HGB 15.8 14.6 15.0 15.3  HCT 46.5 44.7 44.8 45.8  MCV 82.9 83.9 82.5 83.1  PLT 444* 347 305 413   Basic Metabolic Panel: Recent Labs  Lab 05/15/2019 2358 06/08/19 0411 06/09/19 0248 06/10/19 0000  NA 129* 132* 131* 133*  K 4.8 4.6 5.0 4.7  CL 99 102 102 103  CO2 19* 22 21* 22  GLUCOSE 126* 102* 148* 93  BUN 49* 38* 34* 32*  CREATININE 1.32* 1.04 0.98 0.90  CALCIUM 8.5* 8.5*  8.5* 8.6*   GFR: Estimated Creatinine Clearance: 79.9 mL/min (by C-G formula based on SCr of 0.9 mg/dL). Liver Function Tests: Recent Labs  Lab 05/20/2019 2358 06/08/19 0411 06/09/19 0248 06/10/19 0000  AST 71* 95* 97* 137*  ALT 116* 205* 265* 367*  ALKPHOS 71 67 70 66  BILITOT 1.2 0.9 0.7 1.1  PROT 6.0* 5.6* 5.7* 5.7*  ALBUMIN 2.9* 3.0* 3.1* 3.1*   No results for input(s): LIPASE, AMYLASE in the last 168 hours. No results for input(s): AMMONIA in the last 168 hours. Coagulation Profile: No results for input(s): INR, PROTIME in the last 168 hours. Cardiac Enzymes: No results for input(s): CKTOTAL, CKMB, CKMBINDEX, TROPONINI in the last 168 hours. BNP (last 3 results) Recent Labs    02/07/19 0954  PROBNP 57.0   HbA1C: No results for input(s): HGBA1C in the last 72 hours. CBG: No results for input(s): GLUCAP in the last 168 hours. Lipid Profile: No results for input(s): CHOL, HDL, LDLCALC, TRIG, CHOLHDL, LDLDIRECT in the last 72 hours. Thyroid Function Tests: No results for input(s): TSH, T4TOTAL, FREET4, T3FREE, THYROIDAB in the last 72 hours. Anemia Panel: No results for input(s): VITAMINB12, FOLATE, FERRITIN, TIBC, IRON, RETICCTPCT in the last 72 hours. Urine analysis:    Component Value Date/Time   COLORURINE YELLOW 02/07/2019 [place] 02/07/2019 0954   LABSPEC 1.020 02/07/2019  [place] 6.0 02/07/2019 [place] 02/07/2019 0954   HGBUR NEGATIVE 02/07/2019 [place] 02/07/2019 0954   KETONESUR NEGATIVE 02/07/2019 0954   PROTEINUR NEGATIVE 07/12/2018 1506   UROBILINOGEN 0.2 02/07/2019 0954   NITRITE NEGATIVE 02/07/2019 0954   LEUKOCYTESUR NEGATIVE 02/07/2019 0954   Sepsis Labs: @LABRCNTIP (procalcitonin:4,lacticidven:4)  )No results found for this or any previous visit (from the past 240 hour(s)).       Radiology Studies: Dg Chest Port 1 View  Result Date: 06/10/2019 CLINICAL DATA:  Chest tube in  place EXAM: PORTABLE CHEST 1 VIEW COMPARISON:  Radiograph 06/09/2019 FINDINGS: Right pigtail pleural catheter is again seen projecting in the right lower lobe. There is a small to moderate residual pneumothorax. Persistent consolidation and interstitial opacities present throughout the right lung and left lung base. No left pneumothorax. Suspect at least small left effusion. Cardiomediastinal contours are stable. No acute osseous or soft tissue abnormality. IMPRESSION: 1. Small to moderate residual right pneumothorax despite the right pigtail pleural drain. 2. Persistent consolidation and interstitial opacities throughout the right lung and left lung base, consistent with multifocal pneumonia. 3. Suspect at least small left pleural effusion. Electronically Signed   By: Lovena Le M.D.   On: 06/10/2019 00:25   Dg Chest Port 1 View  Result Date: 06/09/2019 CLINICAL DATA:  Follow-up pneumothorax EXAM: PORTABLE CHEST 1 VIEW COMPARISON:  06/09/2019 FINDINGS: Pigtail catheter is now seen on the right with near complete reduction of the previously seen pneumothorax. Persistent infiltrate is noted in the right mid lung as well as the left lung base. IMPRESSION: Persistent bilateral infiltrates. Pigtail catheter in place with near complete reduction in right pneumothorax. Electronically Signed   By: Inez Catalina M.D.   On: 06/09/2019 15:17   Dg Chest Port 1 View  Result Date: 06/09/2019 CLINICAL DATA:  Chest pain. EXAM: PORTABLE CHEST 1 VIEW COMPARISON:  06/07/2019 FINDINGS: Shallow lung inflation. Heart margins are obscured by a lung parenchymal opacities. There has been development of RIGHT-sided pneumothorax, measuring approximately 25-30% of lung volume. No evidence for mediastinal shift. Bilateral parenchymal opacities in the lungs show increase confluence over recent exams. IMPRESSION: 1. Interval development of RIGHT-sided pneumothorax. 2. Increased bilateral parenchymal opacities, consistent with edema or  infection. 3. These results were called by telephone at the time of interpretation on 06/09/2019 at 12:48 pm to provider Vance Gather , who verbally acknowledged these results. Electronically Signed   By: [name] M.D.   On: 06/09/2019 12:56   Vas [place] Lower Extremity Venous (dvt)  Result Date: 06/09/2019  Lower Venous Study Indications: Covid + with elevated d-Dimer. Other Indications: History of bilateral LE bypass grafts. Comparison Study: No priors. Performing Technologist: Oda Cogan RDMS, RVT  Examination Guidelines: A complete evaluation includes B-mode imaging, spectral Doppler, color Doppler, and power Doppler as needed of all accessible portions of each vessel. Bilateral testing is considered an integral part of a complete examination. Limited examinations for reoccurring indications may be performed as noted.  +---------+---------------+---------+-----------+----------+--------------+  RIGHT     Compressibility Phasicity Spontaneity Properties Thrombus Aging  +---------+---------------+---------+-----------+----------+--------------+  CFV       Full            Yes       Yes                                    +---------+---------------+---------+-----------+----------+--------------+  SFJ  Full                                                             +---------+---------------+---------+-----------+----------+--------------+  FV Prox   Full                                                             +---------+---------------+---------+-----------+----------+--------------+  FV Mid    Full                                                             +---------+---------------+---------+-----------+----------+--------------+  FV Distal Full                                                             +---------+---------------+---------+-----------+----------+--------------+  PFV       Full                                                              +---------+---------------+---------+-----------+----------+--------------+  POP       Full            Yes       Yes                                    +---------+---------------+---------+-----------+----------+--------------+  PTV       Full                                                             +---------+---------------+---------+-----------+----------+--------------+  PERO      Full                                                             +---------+---------------+---------+-----------+----------+--------------+   +---------+---------------+---------+-----------+----------+--------------+  LEFT      Compressibility Phasicity Spontaneity Properties Thrombus Aging  +---------+---------------+---------+-----------+----------+--------------+  CFV       Full            Yes       Yes                                    +---------+---------------+---------+-----------+----------+--------------+  SFJ       Full                                                             +---------+---------------+---------+-----------+----------+--------------+  FV Prox   Full                                                             +---------+---------------+---------+-----------+----------+--------------+  FV Mid    Full                                                             +---------+---------------+---------+-----------+----------+--------------+  FV Distal Full                                                             +---------+---------------+---------+-----------+----------+--------------+  PFV       Full                                                             +---------+---------------+---------+-----------+----------+--------------+  POP       Full            Yes       Yes                                    +---------+---------------+---------+-----------+----------+--------------+  PTV       Full                                                              +---------+---------------+---------+-----------+----------+--------------+  PERO      Full                                                             +---------+---------------+---------+-----------+----------+--------------+     Summary: Right: There is no evidence of deep vein thrombosis in the lower extremity. No cystic structure found in the popliteal fossa. Left: There is no evidence of deep vein thrombosis in the lower extremity. No cystic structure found in the popliteal fossa.  *See table(s) above for measurements and observations. Electronically signed  by Servando Snare MD on 06/09/2019 at 2:14:29 PM.    Final         Scheduled Meds:  Chlorhexidine Gluconate Cloth  6 each Topical Daily   clopidogrel  75 mg Oral Daily   enoxaparin (LOVENOX) injection  40 mg Subcutaneous Q12H   gabapentin  100 mg Oral QHS   mesalamine  1.2 g Oral QHS   pantoprazole  40 mg Oral Daily   cyanocobalamin  1,000 mcg Oral Daily   Continuous Infusions:   LOS: 3 days   The patient is critically ill with multiple organ systems failure and requires high complexity decision making for assessment and support, frequent evaluation and titration of therapies, application of advanced monitoring technologies and extensive interpretation of multiple databases. Critical Care Time devoted to patient care services described in this note  Time spent: 40 minutes     Aarthi Uyeno, Geraldo Docker, MD [facility] Pager [phone]  If 7PM-7AM, please contact night-coverage [email or web address] Password [email or web address] 06/10/2019, 2:36 PM

## 2019-06-11 ENCOUNTER — Inpatient Hospital Stay (HOSPITAL_COMMUNITY): Payer: Medicare Other

## 2019-06-11 DIAGNOSIS — U071 COVID-19: Secondary | ICD-10-CM | POA: Diagnosis not present

## 2019-06-11 DIAGNOSIS — J9601 Acute respiratory failure with hypoxia: Secondary | ICD-10-CM | POA: Diagnosis not present

## 2019-06-11 LAB — COMPREHENSIVE METABOLIC PANEL
ALT: 452 U/L — ABNORMAL HIGH (ref 0–44)
AST: 138 U/L — ABNORMAL HIGH (ref 15–41)
Albumin: 3 g/dL — ABNORMAL LOW (ref 3.5–5.0)
Alkaline Phosphatase: 67 U/L (ref 38–126)
Anion gap: 7 (ref 5–15)
BUN: 38 mg/dL — ABNORMAL HIGH (ref 8–23)
CO2: 23 mmol/L (ref 22–32)
Calcium: 8.4 mg/dL — ABNORMAL LOW (ref 8.9–10.3)
Chloride: 102 mmol/L (ref 98–111)
Creatinine, Ser: 0.98 mg/dL (ref 0.61–1.24)
GFR calc Af Amer: >60 mL/min (ref >60)
GFR calc non Af Amer: >60 mL/min (ref >60)
Glucose, Bld: 95 mg/dL (ref 70–99)
Potassium: 4.6 mmol/L (ref 3.5–5.1)
Sodium: 132 mmol/L — ABNORMAL LOW (ref 135–145)
Total Bilirubin: 1 mg/dL (ref 0.3–1.2)
Total Protein: 5.5 g/dL — ABNORMAL LOW (ref 6.5–8.1)

## 2019-06-11 LAB — CBC WITH DIFFERENTIAL/PLATELET
Abs Immature Granulocytes: 0.29 10*3/uL — ABNORMAL HIGH (ref 0.00–0.07)
Basophils Absolute: 0.1 10*3/uL (ref 0.0–0.1)
Basophils Relative: 1 %
Eosinophils Absolute: 0.1 10*3/uL (ref 0.0–0.5)
Eosinophils Relative: 2 %
HCT: 46.6 % (ref 39.0–52.0)
Hemoglobin: 15.5 g/dL (ref 13.0–17.0)
Immature Granulocytes: 4 %
Lymphocytes Relative: 5 %
Lymphs Abs: 0.4 10*3/uL — ABNORMAL LOW (ref 0.7–4.0)
MCH: 27.7 pg (ref 26.0–34.0)
MCHC: 33.3 g/dL (ref 30.0–36.0)
MCV: 83.4 fL (ref 80.0–100.0)
Monocytes Absolute: 0.5 10*3/uL (ref 0.1–1.0)
Monocytes Relative: 8 %
Neutro Abs: 5.2 10*3/uL (ref 1.7–7.7)
Neutrophils Relative %: 80 %
Platelets: 221 10*3/uL (ref 150–400)
RBC: 5.59 MIL/uL (ref 4.22–5.81)
RDW: 16 % — ABNORMAL HIGH (ref 11.5–15.5)
WBC: 6.5 10*3/uL (ref 4.0–10.5)
nRBC: 0 % (ref 0.0–0.2)

## 2019-06-11 LAB — C-REACTIVE PROTEIN: CRP: <0.8 mg/dL (ref <1.0)

## 2019-06-11 LAB — D-DIMER, QUANTITATIVE: D-Dimer, Quant: 2.16 ug/mL-FEU — ABNORMAL HIGH (ref 0.00–0.50)

## 2019-06-11 MED ORDER — ZOLPIDEM TARTRATE 5 MG PO TABS
5.0000 mg | ORAL_TABLET | Freq: Every evening | ORAL | Status: DC | PRN
  Administered 2019-06-11 – 2019-06-12 (×2): 5 mg via ORAL
  Filled 2019-06-11 (×2): qty 1

## 2019-06-11 NOTE — Progress Notes (Signed)
Physical Therapy Treatment Patient Details Name: [name] [name] MRN: [id number] DOB: 1940/10/16 Today's Date: 06/11/2019    History of Present Illness  78 y.o. M, PVD s/p right fem endarterectomy and stent, Ulcerative colitis, Barrett's esophagus, GERD,HLD, HyerK, HypoNa and HTN who presented (9/18) with N/V/D for several weeks. Was admitted to [place] for COVID tx and d/c home 09/28, states he was only home few hours prior to return to ED with hypoxia on 09/28.    PT Comments    Pt agreeable to tx even though he states he has a lot of pain with attempting to take deep breaths. Pt required min a to complete bed mob and sit<>stand transfers this pm.   Follow Up Recommendations  Home health PT;SNF     Equipment Recommendations       Recommendations for Other Services       Precautions / Restrictions Precautions Precautions: Fall;Other (comment)(chest tube in place, monitor 02 sats) Precaution Comments: Desats with activity Restrictions Weight Bearing Restrictions: No    Mobility  Bed Mobility Overal bed mobility: Needs Assistance Bed Mobility: Sidelying to Sit;Sit to Supine   Sidelying to sit: Min assist   Sit to supine: Min assist      Transfers Overall transfer level: Needs assistance Equipment used: 1 person hand held assist Transfers: Sit to/from Stand Sit to Stand: Min assist            Ambulation/Gait                 Stairs             Wheelchair Mobility    Modified Rankin (Stroke Patients Only)       Balance Overall balance assessment: Needs assistance Sitting-balance support: Bilateral upper extremity supported;Feet supported Sitting balance-Leahy Scale: Poor     Standing balance support: Single extremity supported Standing balance-Leahy Scale: Fair Standing balance comment: better with standing than sitting, has pain w/ sitting and LOB occur                            Cognition Arousal/Alertness:  Awake/alert Behavior During Therapy: Anxious Overall Cognitive Status: History of cognitive impairments - at baseline                                        Exercises      General Comments General comments (skin integrity, edema, etc.): Pt was in poor spirits today states that is in so much pain and just wants to pass on as he is tired of being in so much pain. With nurse and therapist encouragement he is ageeable to attempting to work with therapist. worked on bed mob and pt did fairly well does need increased assist than previous session but has been to ICU and now has chest tube in place. sat EOB for some time and noted several times pt cringe and lose his balance stating every time he tries to take a deep breath his side hurts (at site if chest tube).  He wanted to attempt standing and with min a and HHA was able to stand for approx 2-44mns, but in this time he did desat to 69%, Pt was on 3L/min via HFNC and measured via nelcor finger probe on forehead. Once seated was slowly able to recover 02 sats to high 80s. with movement for sit to supine desat to  70s, with attempted scooting in bed desats to 70s, with repositioning to eat again desat. once comfortable in bed and tray infront of him sats returned to high 80s again.       Pertinent Vitals/Pain Pain Assessment: 0-10 Pain Score: 8  Pain Location: right flank at chest tube w/ deep breaths Pain Intervention(s): Limited activity within patient's tolerance;Monitored during session;Repositioned    Home Living                      Prior Function            PT Goals (current goals can now be found in the care plan section) Acute Rehab PT Goals Patient Stated Goal: get better and go home or pass away Time For Goal Achievement: 20-Jul-2019 Potential to Achieve Goals: Fair Progress towards PT goals: (starting over with tx)    Frequency    Min 3X/week      PT Plan Current plan remains appropriate     Co-evaluation              AM-PAC PT "6 Clicks" Mobility   Outcome Measure  Help needed turning from your back to your side while in a flat bed without using bedrails?: A Little Help needed moving from lying on your back to sitting on the side of a flat bed without using bedrails?: A Little Help needed moving to and from a bed to a chair (including a wheelchair)?: A Little Help needed standing up from a chair using your arms (e.g., wheelchair or bedside chair)?: A Little Help needed to walk in hospital room?: A Lot Help needed climbing 3-5 steps with a railing? : A Lot 6 Click Score: 16    End of Session Equipment Utilized During Treatment: Oxygen Activity Tolerance: Treatment limited secondary to medical complications (Comment);Patient limited by pain Patient left: in bed;with call bell/phone within reach(HOB elevated) Nurse Communication: Mobility status;Precautions;Other (comment)(pt poor spirits) PT Visit Diagnosis: Other abnormalities of gait and mobility (R26.89);Muscle weakness (generalized) (M62.81)     Time: 8676-1950 PT Time Calculation (min) (ACUTE ONLY): 24 min  Charges:  $Therapeutic Activity: 23-37 mins                     Horald Chestnut, PT     Delford Field 06/11/2019, 3:44 PM

## 2019-06-11 NOTE — Plan of Care (Signed)
Pt slept intermittently during the night. With complaints of generalized body aches and pain on R chest tube insertion site - prn Tylenol and Morphine given with adequate effect. Alert and oriented. Vitals stable on 3L O2 via salter HFNC. Continues to have dyspnea on exertion and dry cough, prn Robitussin and Tussionex given. Chest tube maintained on -40 dry suction, serosanguinous output noted, intermittent tidalling present, no leaks observed, dressing remain dry and intact, adhesive reinforced. Minimal assist with ADLs. Condom catheter intact and draining well, urine concentrated, encouraged increase oral fluid intake. Skin assessed, R gluteal dressing dry and intact. G.20 IV on L AC SL. Assisted regular repositioning for pressure relief. No other issues, will monitor.   Problem: Respiratory: Goal: Will maintain a patent airway Outcome: Progressing   Problem: Respiratory: Goal: Complications related to the disease process, condition or treatment will be avoided or minimized Outcome: Progressing   Problem: Clinical Measurements: Goal: Ability to maintain clinical measurements within normal limits will improve Outcome: Progressing    Problem: Clinical Measurements: Goal: Diagnostic test results will improve Outcome: Progressing   Problem: Clinical Measurements: Goal: Respiratory complications will improve Outcome: Progressing   Problem: Clinical Measurements: Goal: Cardiovascular complication will be avoided Outcome: Progressing   Problem: Activity: Goal: Risk for activity intolerance will decrease Outcome: Progressing   Problem: Coping: Goal: Level of anxiety will decrease Outcome: Progressing   Problem: Nutrition: Goal: Adequate nutrition will be maintained Outcome: Progressing   Problem: Pain Managment: Goal: General experience of comfort will improve Outcome: Progressing   Problem: Skin Integrity: Goal: Risk for impaired skin integrity will decrease Outcome:  Progressing

## 2019-06-11 NOTE — Progress Notes (Signed)
NAME:  [name] [name], MRN:  [id number], DOB:  06-Apr-1941, LOS: 4 ADMISSION DATE:  05/25/2019, CONSULTATION DATE:  07/02/23 REFERRING MD:  Bonner Puna, CHIEF COMPLAINT:  Chest pain on R   Brief History   78 y/o male re-admitted to [facility] after being treated for COVID pneumonia, found to have a R pneumothorax, Chest tube placed.    Past Medical History  GERD HTN Hyperlipidemia Spinal stenosis Barrett's esophagus Impaired glucose tolerance   Significant Hospital Events   9/28 d/c'd from [facility] on 4L 9/29: readmitted with worsening hypoxemia 07/02/2023: brother died in [place] from Covid and sudden onset cp with ptx found.   Consults:  2023/07/02 CCM  Procedures:  07-02-23 R 14Fr chest tube placed  Significant Diagnostic Tests:  9/30 LE doppler:  Right: There is no evidence of deep vein thrombosis in the lower extremity. No cystic structure found in the popliteal fossa. Left: There is no evidence of deep vein thrombosis in the lower extremity. No cystic structure found in the popliteal fossa.  Micro Data:  9/19 sars2: POSITIVE  Antimicrobials:  none  Interim history/subjective:   Has some pain at chest tube site on the right  Objective   Blood pressure (!) 109/57, pulse 70, temperature 97.9 F (36.6 C), temperature source Oral, resp. rate 15, height 5' 7.99" (1.727 m), weight 102.8 kg, SpO2 95 %.        Intake/Output Summary (Last 24 hours) at 06/11/2019 0736 Last data filed at 06/11/2019 4158 Gross per 24 hour  Intake 480 ml  Output 1280 ml  Net -800 ml   Filed Weights   06/10/19 1223  Weight: 102.8 kg    Examination:  General:  Resting comfortably in chair HENT: NCAT OP clear PULM: Crackles, wheezes bases bilaterally, normal effort CV: RRR, no mgr GI: BS+, soft, nontender MSK: normal bulk and tone Neuro: awake, alert, no distress, MAEW   10/3 CXR images personally reviewed showing: air in the R minor fissure but otherwise lung up, R chest tube in place, bilateral infiltrates   Resolved Hospital Problem list     Assessment & Plan:  Acute respiratory failure with hypoxemia due to COVID 19 pneumonia: completed treatment  Continue to wean off high flow O2 for O2 saturation > 85% Tolerate periods of hypoxemia, goal at rest is greater than 85% SaO2, with movement ideally above 75% Decision for intubation should be based on a change in mental status or physical evidence of ventilatory failure such as nasal flaring, accessory muscle use, paradoxical breathing Out of bed to chair as able Incentive spirometry is important, use every hour Prone positioning while in bed  R pneumothorax Change chest tube to water seal Repeat CXR in AM Consider remove chest tube tomorrow  Best practice:   Per [facility]  Labs   CBC: Recent Labs  Lab 05/22/2019 2354 06/08/19 0411 07-02-19 0248 06/10/19 0000 06/11/19 0404  WBC 16.5* 13.9* 13.6* 9.0 6.5  NEUTROABS 13.9* 11.8* 12.5* 7.6 5.2  HGB 15.8 14.6 15.0 15.3 15.5  HCT 46.5 44.7 44.8 45.8 46.6  MCV 82.9 83.9 82.5 83.1 83.4  PLT 444* 347 305 300 309    Basic Metabolic Panel: Recent Labs  Lab 05/21/2019 2358 06/08/19 0411 07/02/19 0248 06/10/19 0000 06/11/19 0404  NA 129* 132* 131* 133* 132*  K 4.8 4.6 5.0 4.7 4.6  CL 99 102 102 103 102  CO2 19* 22 21* 22 23  GLUCOSE 126* 102* 148* 93 95  BUN 49* 38* 34* 32* 38*  CREATININE  1.32* 1.04 0.98 0.90 0.98  CALCIUM 8.5* 8.5* 8.5* 8.6* 8.4*   GFR: Estimated Creatinine Clearance: 73.4 mL/min (by C-G formula based on SCr of 0.98 mg/dL). Recent Labs  Lab 05/19/2019 2358 06/08/19 0411 06/09/19 0248 06/10/19 0000 06/11/19 0404  PROCALCITON <0.10  --   --   --   --   WBC  --  13.9* 13.6* 9.0 6.5    Liver Function Tests: Recent Labs  Lab 06/02/2019 2358 06/08/19 0411 06/09/19 0248 06/10/19 0000 06/11/19 0404  AST 71* 95* 97* 137* 138*  ALT 116* 205* 265* 367* 452*  ALKPHOS 71 67 70 66 67  BILITOT 1.2 0.9 0.7 1.1 1.0  PROT 6.0* 5.6* 5.7* 5.7* 5.5*  ALBUMIN 2.9* 3.0*  3.1* 3.1* 3.0*   No results for input(s): LIPASE, AMYLASE in the last 168 hours. No results for input(s): AMMONIA in the last 168 hours.  ABG    Component Value Date/Time   TCO2 21 (L) 05/13/2018 0925     Coagulation Profile: No results for input(s): INR, PROTIME in the last 168 hours.  Cardiac Enzymes: No results for input(s): CKTOTAL, CKMB, CKMBINDEX, TROPONINI in the last 168 hours.  HbA1C: Hgb A1c MFr Bld  Date/Time Value Ref Range Status  02/07/2019 09:54 AM 5.4 4.6 - 6.5 % Final    Comment:    Glycemic Control Guidelines for People with Diabetes:Non Diabetic:  <6%Goal of Therapy: <7%Additional Action Suggested:  >8%   12/28/2017 08:59 AM 5.9 4.6 - 6.5 % Final    Comment:    Glycemic Control Guidelines for People with Diabetes:Non Diabetic:  <6%Goal of Therapy: <7%Additional Action Suggested:  >8%     CBG: No results for input(s): GLUCAP in the last 168 hours.   Critical care time: n/a       Roselie Awkward, MD [place] [facility] Pager: [phone] Cell: [phone] If no response, call [phone]

## 2019-06-11 NOTE — Plan of Care (Signed)
  Problem: Respiratory: Goal: Will maintain a patent airway Outcome: Progressing Goal: Complications related to the disease process, condition or treatment will be avoided or minimized Outcome: Progressing  Critical Care MD placed patient on water seal suction.  Pt on 6 L of oxygen via HFNC.

## 2019-06-11 NOTE — Progress Notes (Signed)
PROGRESS NOTE  [name] [name]  TZG:[id number] DOB: 1940/11/16 DOA: 05/26/2019 PCP: Biagio Borg, MD  Outpatient Specialists: Vascular Surgery, Dr. Carlis Abbott; Interventional cardiology, Dr. Gwenlyn Found Brief Narrative: [name] [name] is a 78 y.o. male with a history of PVD, COPD, HTN, HLD, ulcerative colitis, and GERD with Barrett's esophagus who was initially admitted for covid 1/19 - 9/28. On discharge after receiving steroids, tocilizumab and remdesivir, he required 4L supplemental O2 and became more short of breath and hypoxic once he arrived home. He returned to the ED and was admitted 9/29 requiring 10L HFNC with unchanged CXR infiltrates. D-dimer was elevated from prior. Therapeutic anticoagulation was started after discussion with the patient and daughter while awaiting diagnostic studies. On 10/1, hypoxia has worsened and chest pain developed, found to have spontaneous PTX treated in the ICU with 14Fr pigtail drain with improvement. Subsequently transferred back to PCU 10/2, changed to water seal 10/3.  Assessment & Plan: Principal Problem:   COVID-19 virus infection Active Problems:   Essential hypertension   Chronic universal ulcerative colitis ([place])   Acute respiratory failure with hypoxia (HCC)   Elevated liver enzymes   Pneumonia due to COVID-19 virus   Pneumothorax, right   COPD (chronic obstructive pulmonary disease) (HCC)   Ulcerative colitis ([place])  Acute hypoxic respiratory failure due to covid-19 pneumonia: Received tocilizumab 9/25, remdesivir 9/19 - 9/23, and 10 days steroids on initial admission.  - Has completed full course of therapies as above. - Wean oxygen to maintain some permissive hypoxia.  - Continue vitamin C and zinc.  - Prone, IS, OOB encouraged - Avoid NSAIDs, remain euvolemic - Strongly suspect given atherosclerotic burden that peripheral pulse oximetry is bound to be less reliable.  Spontaneous right-sided PTX: s/p 14 Fr. chest tube placed 10/1. Clinically and  radiographically improved as of my personal interpretation of CXR 10/3.  - Chest tube management per PCCM, to water seal today. Repeat CXR 10/4  Elevated d-dimer: Appears to have stabilized. As previously discussed, CTA chest is complicated by contrast allergy and claustrophobia. V/Q scan certain to be nondiagnostic.  - LE dopplers negative - Will decrease to prophylactic anticoagulation based on improvement following CT placement.  Chest pain: Due to PTX, improved. No pain is at site of chest tube, will treat symptomatically and remove tube when able.   Elevated LFTs: Not symptomatic, ?If delayed effect of tocilizumab/remdesivir and/or covid-related (felt less likely) - Continue to trend. Consider RUQ U/S.  COPD: No exacerbation/indication for steroids.  - Continue symbicort and prn BDs (MDI)  PVD: Extensive vasculopathy s/p stenting.  - Continue plavix  AKI: Improving.   HTN:  - Continue HCTZ, lisinopril  Hyponatremia: Suspect due to thiazide, ACEi.  - Monitor  GERD:  - Continue PPI  Ulcerative colitis: Quiescent.  - Continue mesalamine.   Obesity: BMI 34.   DVT prophylaxis: Lovenox Code Status: Full code. Family Communication: Daughter by phone Disposition Plan: Continue inpatient management.  Consultants:   None  Procedures:   None  Antimicrobials:  None   Subjective: Shortness of breath improved from yesterday and days before but severely dyspneic when walking to chair at bedside with SpO2 dropping durably to low 80%'s. Pain at site of right chest tube is stable, moderate, constant, worse with movement.   Objective: Vitals:   06/11/19 0413 06/11/19 0748 06/11/19 0807 06/11/19 1122  BP: (!) 109/57 (!) 110/54 (!) 110/54 115/86  Pulse: 70 67 71 69  Resp: 15 15 18 19   Temp: 97.9 F (36.6 C)  98.1 F (36.7 C) 97.9 F (36.6 C)  TempSrc: Oral Oral Oral Oral  SpO2: 95% 96% 95% 98%  Weight:      Height:        Intake/Output Summary (Last 24 hours) at  06/11/2019 1130 Last data filed at 06/11/2019 1105 Gross per 24 hour  Intake 480 ml  Output 1330 ml  Net -850 ml   Gen: Elderly male in no distress Pulm: Nonlabored tachypnea, shallow inspirations, crackles bilaterally. Aeration throughout. CV: Regular rate and rhythm. No murmur, rub, or gallop. No JVD, no dependent edema. GI: Abdomen soft, non-tender, non-distended, with normoactive bowel sounds.  Ext: Warm, no deformities Skin: No rashes, lesions or ulcers on visualized skin. Neuro: Alert and oriented. No focal neurological deficits. Psych: Judgement and insight appear fair. Mood euthymic & affect congruent. Behavior is appropriate.    Data Reviewed: I have personally reviewed following labs and imaging studies  CBC: Recent Labs  Lab 05/15/2019 2354 06/08/19 0411 06/09/19 0248 06/10/19 0000 06/11/19 0404  WBC 16.5* 13.9* 13.6* 9.0 6.5  NEUTROABS 13.9* 11.8* 12.5* 7.6 5.2  HGB 15.8 14.6 15.0 15.3 15.5  HCT 46.5 44.7 44.8 45.8 46.6  MCV 82.9 83.9 82.5 83.1 83.4  PLT 444* 347 305 300 469   Basic Metabolic Panel: Recent Labs  Lab 05/11/2019 2358 06/08/19 0411 06/09/19 0248 06/10/19 0000 06/11/19 0404  NA 129* 132* 131* 133* 132*  K 4.8 4.6 5.0 4.7 4.6  CL 99 102 102 103 102  CO2 19* 22 21* 22 23  GLUCOSE 126* 102* 148* 93 95  BUN 49* 38* 34* 32* 38*  CREATININE 1.32* 1.04 0.98 0.90 0.98  CALCIUM 8.5* 8.5* 8.5* 8.6* 8.4*   GFR: Estimated Creatinine Clearance: 73.4 mL/min (by C-G formula based on SCr of 0.98 mg/dL). Liver Function Tests: Recent Labs  Lab 05/24/2019 2358 06/08/19 0411 06/09/19 0248 06/10/19 0000 06/11/19 0404  AST 71* 95* 97* 137* 138*  ALT 116* 205* 265* 367* 452*  ALKPHOS 71 67 70 66 67  BILITOT 1.2 0.9 0.7 1.1 1.0  PROT 6.0* 5.6* 5.7* 5.7* 5.5*  ALBUMIN 2.9* 3.0* 3.1* 3.1* 3.0*   No results for input(s): LIPASE, AMYLASE in the last 168 hours. No results for input(s): AMMONIA in the last 168 hours. Coagulation Profile: No results for  input(s): INR, PROTIME in the last 168 hours. Cardiac Enzymes: No results for input(s): CKTOTAL, CKMB, CKMBINDEX, TROPONINI in the last 168 hours. BNP (last 3 results) Recent Labs    02/07/19 0954  PROBNP 57.0   HbA1C: No results for input(s): HGBA1C in the last 72 hours. CBG: No results for input(s): GLUCAP in the last 168 hours. Lipid Profile: No results for input(s): CHOL, HDL, LDLCALC, TRIG, CHOLHDL, LDLDIRECT in the last 72 hours. Thyroid Function Tests: No results for input(s): TSH, T4TOTAL, FREET4, T3FREE, THYROIDAB in the last 72 hours. Anemia Panel: No results for input(s): VITAMINB12, FOLATE, FERRITIN, TIBC, IRON, RETICCTPCT in the last 72 hours. Urine analysis:    Component Value Date/Time   COLORURINE YELLOW 02/07/2019 [place] 02/07/2019 0954   LABSPEC 1.020 02/07/2019 0954   PHURINE 6.0 02/07/2019 0954   GLUCOSEU NEGATIVE 02/07/2019 0954   HGBUR NEGATIVE 02/07/2019 0954   BILIRUBINUR NEGATIVE 02/07/2019 0954   KETONESUR NEGATIVE 02/07/2019 0954   PROTEINUR NEGATIVE 07/12/2018 1506   UROBILINOGEN 0.2 02/07/2019 0954   NITRITE NEGATIVE 02/07/2019 0954   LEUKOCYTESUR NEGATIVE 02/07/2019 0954   No results found for this or any previous visit (from the  past 240 hour(s)).    Radiology Studies: Dg Chest Port 1 View  Result Date: 06/11/2019 CLINICAL DATA:  COVID-19 positive. EXAM: PORTABLE CHEST 1 VIEW COMPARISON:  06/09/2019 FINDINGS: Right-sided pleural drainage catheter unchanged. Lungs are hypoinflated with stable patchy bilateral predominantly peripheral airspace process likely multifocal pneumonia. No effusion. Cardiomediastinal silhouette and remainder of the exam is unchanged. IMPRESSION: Persistent multifocal peripheral airspace process likely multifocal pneumonia. Right-sided pleural drainage catheter unchanged. Electronically Signed   By: Marin Olp M.D.   On: 06/11/2019 08:59   Dg Chest Port 1 View  Result Date: 06/10/2019 CLINICAL  DATA:  Chest tube in place EXAM: PORTABLE CHEST 1 VIEW COMPARISON:  Radiograph 06/09/2019 FINDINGS: Right pigtail pleural catheter is again seen projecting in the right lower lobe. There is a small to moderate residual pneumothorax. Persistent consolidation and interstitial opacities present throughout the right lung and left lung base. No left pneumothorax. Suspect at least small left effusion. Cardiomediastinal contours are stable. No acute osseous or soft tissue abnormality. IMPRESSION: 1. Small to moderate residual right pneumothorax despite the right pigtail pleural drain. 2. Persistent consolidation and interstitial opacities throughout the right lung and left lung base, consistent with multifocal pneumonia. 3. Suspect at least small left pleural effusion. Electronically Signed   By: Lovena Le M.D.   On: 06/10/2019 00:25   Dg Chest Port 1 View  Result Date: 06/09/2019 CLINICAL DATA:  Follow-up pneumothorax EXAM: PORTABLE CHEST 1 VIEW COMPARISON:  06/09/2019 FINDINGS: Pigtail catheter is now seen on the right with near complete reduction of the previously seen pneumothorax. Persistent infiltrate is noted in the right mid lung as well as the left lung base. IMPRESSION: Persistent bilateral infiltrates. Pigtail catheter in place with near complete reduction in right pneumothorax. Electronically Signed   By: Inez Catalina M.D.   On: 06/09/2019 15:17   Dg Chest Port 1 View  Result Date: 06/09/2019 CLINICAL DATA:  Chest pain. EXAM: PORTABLE CHEST 1 VIEW COMPARISON:  06/07/2019 FINDINGS: Shallow lung inflation. Heart margins are obscured by a lung parenchymal opacities. There has been development of RIGHT-sided pneumothorax, measuring approximately 25-30% of lung volume. No evidence for mediastinal shift. Bilateral parenchymal opacities in the lungs show increase confluence over recent exams. IMPRESSION: 1. Interval development of RIGHT-sided pneumothorax. 2. Increased bilateral parenchymal opacities,  consistent with edema or infection. 3. These results were called by telephone at the time of interpretation on 06/09/2019 at 12:48 pm to provider Vance Gather , who verbally acknowledged these results. Electronically Signed   By: [name] M.D.   On: 06/09/2019 12:56    Scheduled Meds: . Chlorhexidine Gluconate Cloth  6 each Topical Daily  . clopidogrel  75 mg Oral Daily  . enoxaparin (LOVENOX) injection  40 mg Subcutaneous Q12H  . gabapentin  100 mg Oral QHS  . mesalamine  1.2 g Oral QHS  . pantoprazole  40 mg Oral Daily  . cyanocobalamin  1,000 mcg Oral Daily   Continuous Infusions:   LOS: 4 days   Time spent: 35 minutes.  Patrecia Pour, MD [facility] [email or web address] Password [email or web address] 06/11/2019, 11:30 AM

## 2019-06-12 ENCOUNTER — Inpatient Hospital Stay (HOSPITAL_COMMUNITY): Payer: Medicare Other

## 2019-06-12 DIAGNOSIS — J939 Pneumothorax, unspecified: Secondary | ICD-10-CM | POA: Diagnosis not present

## 2019-06-12 DIAGNOSIS — J069 Acute upper respiratory infection, unspecified: Secondary | ICD-10-CM

## 2019-06-12 DIAGNOSIS — U071 COVID-19: Secondary | ICD-10-CM | POA: Diagnosis not present

## 2019-06-12 LAB — CBC WITH DIFFERENTIAL/PLATELET
Abs Immature Granulocytes: 0.27 10*3/uL — ABNORMAL HIGH (ref 0.00–0.07)
Basophils Absolute: 0.1 10*3/uL (ref 0.0–0.1)
Basophils Relative: 1 %
Eosinophils Absolute: 0.2 10*3/uL (ref 0.0–0.5)
Eosinophils Relative: 2 %
HCT: 45.4 % (ref 39.0–52.0)
Hemoglobin: 15.2 g/dL (ref 13.0–17.0)
Immature Granulocytes: 4 %
Lymphocytes Relative: 7 %
Lymphs Abs: 0.5 10*3/uL — ABNORMAL LOW (ref 0.7–4.0)
MCH: 27.9 pg (ref 26.0–34.0)
MCHC: 33.5 g/dL (ref 30.0–36.0)
MCV: 83.5 fL (ref 80.0–100.0)
Monocytes Absolute: 0.6 10*3/uL (ref 0.1–1.0)
Monocytes Relative: 8 %
Neutro Abs: 5.4 10*3/uL (ref 1.7–7.7)
Neutrophils Relative %: 78 %
Platelets: 216 10*3/uL (ref 150–400)
RBC: 5.44 MIL/uL (ref 4.22–5.81)
RDW: 16.2 % — ABNORMAL HIGH (ref 11.5–15.5)
WBC: 6.9 10*3/uL (ref 4.0–10.5)
nRBC: 0 % (ref 0.0–0.2)

## 2019-06-12 LAB — C-REACTIVE PROTEIN: CRP: <0.8 mg/dL (ref <1.0)

## 2019-06-12 LAB — COMPREHENSIVE METABOLIC PANEL
ALT: 464 U/L — ABNORMAL HIGH (ref 0–44)
AST: 110 U/L — ABNORMAL HIGH (ref 15–41)
Albumin: 3.1 g/dL — ABNORMAL LOW (ref 3.5–5.0)
Alkaline Phosphatase: 59 U/L (ref 38–126)
Anion gap: 4 — ABNORMAL LOW (ref 5–15)
BUN: 37 mg/dL — ABNORMAL HIGH (ref 8–23)
CO2: 27 mmol/L (ref 22–32)
Calcium: 8.6 mg/dL — ABNORMAL LOW (ref 8.9–10.3)
Chloride: 102 mmol/L (ref 98–111)
Creatinine, Ser: 1.06 mg/dL (ref 0.61–1.24)
GFR calc Af Amer: >60 mL/min (ref >60)
GFR calc non Af Amer: >60 mL/min (ref >60)
Glucose, Bld: 109 mg/dL — ABNORMAL HIGH (ref 70–99)
Potassium: 5.5 mmol/L — ABNORMAL HIGH (ref 3.5–5.1)
Sodium: 133 mmol/L — ABNORMAL LOW (ref 135–145)
Total Bilirubin: 0.8 mg/dL (ref 0.3–1.2)
Total Protein: 5.5 g/dL — ABNORMAL LOW (ref 6.5–8.1)

## 2019-06-12 LAB — D-DIMER, QUANTITATIVE: D-Dimer, Quant: 1.66 ug/mL-FEU — ABNORMAL HIGH (ref 0.00–0.50)

## 2019-06-12 MED ORDER — FUROSEMIDE 20 MG PO TABS
40.0000 mg | ORAL_TABLET | Freq: Once | ORAL | Status: AC
  Administered 2019-06-12: 40 mg via ORAL
  Filled 2019-06-12: qty 2

## 2019-06-12 MED ORDER — SODIUM ZIRCONIUM CYCLOSILICATE 10 G PO PACK
10.0000 g | PACK | Freq: Every day | ORAL | Status: DC
  Administered 2019-06-12 – 2019-06-15 (×4): 10 g via ORAL
  Filled 2019-06-12 (×6): qty 1

## 2019-06-12 NOTE — Progress Notes (Signed)
NAME:  [name] [name], MRN:  [id number], DOB:  1941-08-02, LOS: 5 ADMISSION DATE:  05/18/2019, CONSULTATION DATE:  06/23/23 REFERRING MD:  Bonner Puna, CHIEF COMPLAINT:  Chest pain on R   Brief History   78 y/o male re-admitted to [facility] after being treated for COVID pneumonia, found to have a R pneumothorax, Chest tube placed.    Past Medical History  GERD HTN Hyperlipidemia Spinal stenosis Barrett's esophagus Impaired glucose tolerance   Significant Hospital Events   9/28 d/c'd from [facility] on 4L 9/29: readmitted with worsening hypoxemia 06/23/23: brother died in [place] from Covid and sudden onset cp with ptx found.   Consults:  2023/06/23 CCM  Procedures:  06-23-2023 R 14Fr chest tube placed  Significant Diagnostic Tests:  9/30 LE doppler:  Right: There is no evidence of deep vein thrombosis in the lower extremity. No cystic structure found in the popliteal fossa. Left: There is no evidence of deep vein thrombosis in the lower extremity. No cystic structure found in the popliteal fossa.  Micro Data:  9/19 SARS COV2: POSITIVE  Antimicrobials:  none  Interim history/subjective:   Feels OK   Objective   Blood pressure (!) 121/59, pulse 63, temperature 98 F (36.7 C), temperature source Oral, resp. rate 14, height 5' 7.99" (1.727 m), weight 102.8 kg, SpO2 99 %.        Intake/Output Summary (Last 24 hours) at 06/12/2019 0729 Last data filed at 06/12/2019 0300 Gross per 24 hour  Intake 480 ml  Output 585 ml  Net -105 ml   Filed Weights   06/10/19 1223  Weight: 102.8 kg    Examination:  General:  Resting comfortably in bed HENT: NCAT OP clear PULM: Crackles bases B, normal effort CV: RRR, no mgr GI: BS+, soft, nontender MSK: normal bulk and tone Neuro: awake, alert, no distress, MAEW    10/4 CXR images personally reviewed showing: R apical pneumothorax, chest tube in place  Resolved Hospital Problem list     Assessment & Plan:  Acute respiratory failure with hypoxemia due to  COVID 19 pneumonia: completed treatment  Continue to wean off high flow O2 for O2 saturation > 85% Tolerate periods of hypoxemia, goal at rest is greater than 85% SaO2, with movement ideally above 75% Decision for intubation should be based on a change in mental status or physical evidence of ventilatory failure such as nasal flaring, accessory muscle use, paradoxical breathing Out of bed to chair as able Incentive spirometry is important, use every hour Prone positioning while in bed: if he can tolerate  R pneumothorax: worse on 10/4 CXR Change chest tube back to -20cm H20 CXR in AM   Best practice:   Per [facility]  Labs   CBC: Recent Labs  Lab 06/08/19 0411 2019-06-23 0248 06/10/19 0000 06/11/19 0404 06/12/19 0518  WBC 13.9* 13.6* 9.0 6.5 6.9  NEUTROABS 11.8* 12.5* 7.6 5.2 5.4  HGB 14.6 15.0 15.3 15.5 15.2  HCT 44.7 44.8 45.8 46.6 45.4  MCV 83.9 82.5 83.1 83.4 83.5  PLT 347 305 300 221 425    Basic Metabolic Panel: Recent Labs  Lab 06/08/19 0411 Jun 23, 2019 0248 06/10/19 0000 06/11/19 0404 06/12/19 0518  NA 132* 131* 133* 132* 133*  K 4.6 5.0 4.7 4.6 5.5*  CL 102 102 103 102 102  CO2 22 21* 22 23 27   GLUCOSE 102* 148* 93 95 109*  BUN 38* 34* 32* 38* 37*  CREATININE 1.04 0.98 0.90 0.98 1.06  CALCIUM 8.5* 8.5* 8.6* 8.4* 8.6*  GFR: Estimated Creatinine Clearance: 67.9 mL/min (by C-G formula based on SCr of 1.06 mg/dL). Recent Labs  Lab 05/23/2019 2358  06/09/19 0248 06/10/19 0000 06/11/19 0404 06/12/19 0518  PROCALCITON <0.10  --   --   --   --   --   WBC  --    < > 13.6* 9.0 6.5 6.9   < > = values in this interval not displayed.    Liver Function Tests: Recent Labs  Lab 06/08/19 0411 06/09/19 0248 06/10/19 0000 06/11/19 0404 06/12/19 0518  AST 95* 97* 137* 138* 110*  ALT 205* 265* 367* 452* 464*  ALKPHOS 67 70 66 67 59  BILITOT 0.9 0.7 1.1 1.0 0.8  PROT 5.6* 5.7* 5.7* 5.5* 5.5*  ALBUMIN 3.0* 3.1* 3.1* 3.0* 3.1*   No results for input(s): LIPASE,  AMYLASE in the last 168 hours. No results for input(s): AMMONIA in the last 168 hours.  ABG    Component Value Date/Time   TCO2 21 (L) 05/13/2018 0925     Coagulation Profile: No results for input(s): INR, PROTIME in the last 168 hours.  Cardiac Enzymes: No results for input(s): CKTOTAL, CKMB, CKMBINDEX, TROPONINI in the last 168 hours.  HbA1C: Hgb A1c MFr Bld  Date/Time Value Ref Range Status  02/07/2019 09:54 AM 5.4 4.6 - 6.5 % Final    Comment:    Glycemic Control Guidelines for People with Diabetes:Non Diabetic:  <6%Goal of Therapy: <7%Additional Action Suggested:  >8%   12/28/2017 08:59 AM 5.9 4.6 - 6.5 % Final    Comment:    Glycemic Control Guidelines for People with Diabetes:Non Diabetic:  <6%Goal of Therapy: <7%Additional Action Suggested:  >8%     CBG: No results for input(s): GLUCAP in the last 168 hours.   Critical care time: n/a       Roselie Awkward, MD [place] [facility] Pager: [phone] Cell: [phone] If no response, call [phone]

## 2019-06-12 NOTE — Plan of Care (Signed)

## 2019-06-12 NOTE — Plan of Care (Signed)
Pt slept well during the night. Mild pain on R chest tube insertion site - prn Tylenol and Morphine given with adequate effect. Alert and oriented. Vitals stable on 3L O2 via [place] HFNC. Continues to have dyspnea on exertion and dry cough, prn Robitussin and Tussionex given. Chest tube maintained on water seal, serous output noted, intermittent tidalling present, no leaks observed, dressing remain dry and intact, adhesive reinforced. Minimal assist with ADLs. Condom catheter replaced, intact and draining well, urine concentrated, encouraged increase oral fluid intake. Skin assessed, R gluteal dressing dry and intact. G.20 IV on L AC SL. Assisted regular repositioning for pressure relief. No other issues, will monitor.  Problem: Respiratory: Goal: Will maintain a patent airway Outcome: Progressing   Problem: Coping: Goal: Psychosocial and spiritual needs will be supported Outcome: Progressing   Problem: Education: Goal: Knowledge of risk factors and measures for prevention of condition will improve Outcome: Progressing   Problem: Clinical Measurements: Goal: Ability to maintain clinical measurements within normal limits will improve Outcome: Progressing   Problem: Activity: Goal: Risk for activity intolerance will decrease Outcome: Progressing

## 2019-06-12 NOTE — Progress Notes (Signed)
PROGRESS NOTE  [name] [name]  UUV:[id number] DOB: 1940/10/08 DOA: 05/10/2019 PCP: Biagio Borg, MD  Outpatient Specialists: Vascular Surgery, Dr. Carlis Abbott; Interventional cardiology, Dr. Gwenlyn Found Brief Narrative: [name] [name] is a 78 y.o. male with a history of PVD, COPD, HTN, HLD, ulcerative colitis, and GERD with Barrett's esophagus who was initially admitted for covid 1/19 - 9/28. On discharge after receiving steroids, tocilizumab and remdesivir, he required 4L supplemental O2 and became more short of breath and hypoxic once he arrived home. He returned to the ED and was admitted 9/29 requiring 10L HFNC with unchanged CXR infiltrates. D-dimer was elevated from prior. Therapeutic anticoagulation was started after discussion with the patient and daughter while awaiting diagnostic studies. On 10/1, hypoxia has worsened and chest pain developed, found to have spontaneous PTX treated in the ICU with 14Fr pigtail drain with improvement. Subsequently transferred back to PCU 10/2, changed to water seal 10/3.  Assessment & Plan: Principal Problem:   COVID-19 virus infection Active Problems:   Essential hypertension   Chronic universal ulcerative colitis ([place])   Acute respiratory failure with hypoxia (HCC)   Elevated liver enzymes   Pneumonia due to COVID-19 virus   Pneumothorax, right   COPD (chronic obstructive pulmonary disease) (HCC)   Ulcerative colitis ([place])  Acute hypoxic respiratory failure due to covid-19 pneumonia: Received tocilizumab 9/25, remdesivir 9/19 - 9/23, and 10 days steroids on initial admission.  - Has completed full course of therapies as above. CRP remains negative, will stop checking.  - Wean oxygen to maintain some permissive hypoxia.  - Continue vitamin C and zinc.  - Prone, IS, OOB encouraged - Avoid NSAIDs, remain euvolemic - Strongly suspect given atherosclerotic burden that peripheral pulse oximetry is bound to be less reliable.  Spontaneous right-sided PTX: s/p 14  Fr. chest tube placed 10/1. Clinically and radiographically improved.  - Repeat CXR today pending.  - Chest tube management per PCCM. May be able to remove today.  Elevated d-dimer: Appears to have stabilized. As previously discussed, CTA chest is complicated by contrast allergy and claustrophobia. V/Q scan certain to be nondiagnostic.  - LE dopplers negative - Will decrease to prophylactic anticoagulation based on improvement following CT placement.  Hyperkalemia:  - Lasix, lokelma, recheck in AM. Continue telemetry.   Chest pain: Due to PTX, improved. No pain is at site of chest tube, will treat symptomatically and remove tube when able.   Elevated LFTs: Not symptomatic, ?If delayed effect of tocilizumab/remdesivir and/or covid-related (felt less likely) - RUQ U/S. No cholestatic findings on labs, no abd tenderness.  COPD: No exacerbation/indication for steroids.  - Continue symbicort and prn BDs (MDI)  PVD: Extensive vasculopathy s/p stenting.  - Continue plavix  AKI: Improving.   HTN:  - Continue HCTZ, lisinopril  Hyponatremia: Suspect due to thiazide, ACEi.  - Monitor  GERD:  - Continue PPI  Ulcerative colitis: Quiescent.  - Continue mesalamine.   Obesity: BMI 34.   DVT prophylaxis: Lovenox Code Status: Full code. Family Communication: Daughter by phone Disposition Plan: Continue inpatient management.  Consultants:   None  Procedures:   Right chest tube insertion 10/1.   Antimicrobials:  None   Subjective: Major complaint is being hooked up to so many wires, tubes, etc. Right sided pain around chest tube is stable, improved with tylenol and morphine. dyspnea is stable, severe with exertion. No anterior chest pain.   Objective: Vitals:   06/11/19 1549 06/11/19 2043 06/11/19 2300 06/12/19 0318  BP: 127/60 123/65  [name])  121/59  Pulse: 74 72 70 63  Resp: 19 20 17 14   Temp: 97.8 F (36.6 C) 98.6 F (37 C)  98 F (36.7 C)  TempSrc: Oral Oral  Oral   SpO2: 99% 97% 98% 99%  Weight:      Height:        Intake/Output Summary (Last 24 hours) at 06/12/2019 1009 Last data filed at 06/12/2019 0300 Gross per 24 hour  Intake 480 ml  Output 585 ml  Net -105 ml   Gen: Pleasant elderly male in no distress Pulm: Nonlabored tachypnea, shallow inspiration with minimal crackles. CV: Regular rate and rhythm. No murmur, rub, or gallop. No JVD, no dependent edema. GI: Abdomen soft, non-tender, non-distended, with normoactive bowel sounds.  Ext: Warm, no deformities Skin: No new rashes, lesions or ulcers on visualized skin. R chest tube lateral chest wall site is c/d/i, serosanguinous drainage no air leak.  Neuro: Alert and oriented. No focal neurological deficits. Psych: Judgement and insight appear fair. Mood euthymic & affect congruent. Behavior is appropriate.    Data Reviewed: I have personally reviewed following labs and imaging studies  CBC: Recent Labs  Lab 06/08/19 0411 06/09/19 0248 06/10/19 0000 06/11/19 0404 06/12/19 0518  WBC 13.9* 13.6* 9.0 6.5 6.9  NEUTROABS 11.8* 12.5* 7.6 5.2 5.4  HGB 14.6 15.0 15.3 15.5 15.2  HCT 44.7 44.8 45.8 46.6 45.4  MCV 83.9 82.5 83.1 83.4 83.5  PLT 347 305 300 221 462   Basic Metabolic Panel: Recent Labs  Lab 06/08/19 0411 06/09/19 0248 06/10/19 0000 06/11/19 0404 06/12/19 0518  NA 132* 131* 133* 132* 133*  K 4.6 5.0 4.7 4.6 5.5*  CL 102 102 103 102 102  CO2 22 21* 22 23 27   GLUCOSE 102* 148* 93 95 109*  BUN 38* 34* 32* 38* 37*  CREATININE 1.04 0.98 0.90 0.98 1.06  CALCIUM 8.5* 8.5* 8.6* 8.4* 8.6*   GFR: Estimated Creatinine Clearance: 67.9 mL/min (by C-G formula based on SCr of 1.06 mg/dL). Liver Function Tests: Recent Labs  Lab 06/08/19 0411 06/09/19 0248 06/10/19 0000 06/11/19 0404 06/12/19 0518  AST 95* 97* 137* 138* 110*  ALT 205* 265* 367* 452* 464*  ALKPHOS 67 70 66 67 59  BILITOT 0.9 0.7 1.1 1.0 0.8  PROT 5.6* 5.7* 5.7* 5.5* 5.5*  ALBUMIN 3.0* 3.1* 3.1* 3.0* 3.1*    Radiology Studies: Dg Chest Port 1 View  Result Date: 06/11/2019 CLINICAL DATA:  COVID-19 positive. EXAM: PORTABLE CHEST 1 VIEW COMPARISON:  06/09/2019 FINDINGS: Right-sided pleural drainage catheter unchanged. Lungs are hypoinflated with stable patchy bilateral predominantly peripheral airspace process likely multifocal pneumonia. No effusion. Cardiomediastinal silhouette and remainder of the exam is unchanged. IMPRESSION: Persistent multifocal peripheral airspace process likely multifocal pneumonia. Right-sided pleural drainage catheter unchanged. Electronically Signed   By: Marin Olp M.D.   On: 06/11/2019 08:59    Scheduled Meds: . Chlorhexidine Gluconate Cloth  6 each Topical Daily  . clopidogrel  75 mg Oral Daily  . enoxaparin (LOVENOX) injection  40 mg Subcutaneous Q12H  . gabapentin  100 mg Oral QHS  . mesalamine  1.2 g Oral QHS  . pantoprazole  40 mg Oral Daily  . sodium zirconium cyclosilicate  10 g Oral Daily  . cyanocobalamin  1,000 mcg Oral Daily   Continuous Infusions:   LOS: 5 days   Time spent: 35 minutes.  Patrecia Pour, MD [facility] [email or web address] Password [email or web address] 06/12/2019, 10:09 AM

## 2019-06-13 ENCOUNTER — Ambulatory Visit (HOSPITAL_COMMUNITY)
Admit: 2019-06-13 | Discharge: 2019-06-13 | Disposition: A | Discharge status: Home / Self Care | Payer: Medicare Other | Attending: Pulmonary Disease | Admitting: Pulmonary Disease

## 2019-06-13 ENCOUNTER — Inpatient Hospital Stay (HOSPITAL_COMMUNITY): Payer: Medicare Other

## 2019-06-13 DIAGNOSIS — K5100 Ulcerative (chronic) pancolitis without complications: Secondary | ICD-10-CM

## 2019-06-13 DIAGNOSIS — U071 COVID-19: Secondary | ICD-10-CM | POA: Diagnosis not present

## 2019-06-13 DIAGNOSIS — J431 Panlobular emphysema: Secondary | ICD-10-CM

## 2019-06-13 DIAGNOSIS — J9601 Acute respiratory failure with hypoxia: Secondary | ICD-10-CM | POA: Diagnosis not present

## 2019-06-13 DIAGNOSIS — J95811 Postprocedural pneumothorax: Secondary | ICD-10-CM | POA: Diagnosis not present

## 2019-06-13 LAB — COMPREHENSIVE METABOLIC PANEL
ALT: 427 U/L — ABNORMAL HIGH (ref 0–44)
AST: 110 U/L — ABNORMAL HIGH (ref 15–41)
Albumin: 3.3 g/dL — ABNORMAL LOW (ref 3.5–5.0)
Alkaline Phosphatase: 60 U/L (ref 38–126)
Anion gap: 8 (ref 5–15)
BUN: 30 mg/dL — ABNORMAL HIGH (ref 8–23)
CO2: 25 mmol/L (ref 22–32)
Calcium: 8.6 mg/dL — ABNORMAL LOW (ref 8.9–10.3)
Chloride: 98 mmol/L (ref 98–111)
Creatinine, Ser: 0.96 mg/dL (ref 0.61–1.24)
GFR calc Af Amer: >60 mL/min (ref >60)
GFR calc non Af Amer: >60 mL/min (ref >60)
Glucose, Bld: 97 mg/dL (ref 70–99)
Potassium: 4.4 mmol/L (ref 3.5–5.1)
Sodium: 131 mmol/L — ABNORMAL LOW (ref 135–145)
Total Bilirubin: 1.1 mg/dL (ref 0.3–1.2)
Total Protein: 5.6 g/dL — ABNORMAL LOW (ref 6.5–8.1)

## 2019-06-13 LAB — D-DIMER, QUANTITATIVE: D-Dimer, Quant: 1.46 ug/mL-FEU — ABNORMAL HIGH (ref 0.00–0.50)

## 2019-06-13 MED ORDER — ZOLPIDEM TARTRATE 5 MG PO TABS
10.0000 mg | ORAL_TABLET | Freq: Every evening | ORAL | Status: DC | PRN
  Administered 2019-06-13 – 2019-06-21 (×9): 10 mg via ORAL
  Filled 2019-06-13 (×9): qty 2

## 2019-06-13 MED ORDER — DIPHENHYDRAMINE HCL 25 MG PO CAPS
25.0000 mg | ORAL_CAPSULE | Freq: Every evening | ORAL | Status: DC | PRN
  Administered 2019-06-13 – 2019-06-14 (×2): 25 mg via ORAL
  Filled 2019-06-13 (×2): qty 1

## 2019-06-13 NOTE — Progress Notes (Signed)
NAME:  [name] [name], MRN:  [id number], DOB:  [birth year], LOS: 6 ADMISSION DATE:  06/04/2019, CONSULTATION DATE:  06-28-2023 REFERRING MD:  Bonner Puna, CHIEF COMPLAINT:  Chest pain on R   Brief History   78 y/o male re-admitted to [facility] after being treated for COVID pneumonia, found to have a R pneumothorax, Chest tube placed.   Past Medical History  GERD HTN Hyperlipidemia Spinal stenosis Barrett's esophagus Impaired glucose tolerance   Significant Hospital Events   9/28 d/c'd from [facility] on 4L 9/29: readmitted with worsening hypoxemia 06-28-2023: brother died in [place] from Covid and sudden onset cp with ptx found.   Consults:  06/28/2023 CCM  Procedures:  Jun 28, 2023 R 14Fr chest tube placed  Significant Diagnostic Tests:  9/30 LE doppler:  Right: There is no evidence of deep vein thrombosis in the lower extremity. No cystic structure found in the popliteal fossa. Left: There is no evidence of deep vein thrombosis in the lower extremity. No cystic structure found in the popliteal fossa.  Micro Data:  9/19 SARS COV2: POSITIVE  Antimicrobials:  none  Interim history/subjective:   Some pain over telemetry lead sites Says breathing is up and down No right sided chest pain  Objective   Blood pressure (!) 120/59, pulse 66, temperature 97.7 F (36.5 C), temperature source Oral, resp. rate 20, height 5' 7.99" (1.727 m), weight 102.8 kg, SpO2 94 %.        Intake/Output Summary (Last 24 hours) at 06/13/2019 0747 Last data filed at 06/13/2019 3818 Gross per 24 hour  Intake 360 ml  Output 875 ml  Net -515 ml   Filed Weights   06/10/19 1223  Weight: 102.8 kg    Examination:  General:  Resting comfortably in bed HENT: NCAT OP clear PULM: Mostly CTA bilaterally, few crackles bases, normal effort CV: RRR, no mgr GI: BS+, soft, nontender MSK: normal bulk and tone Neuro: awake, alert, no distress, MAEW   10/5 CXR images personally reviewed showing: small R pneumothorax  Resolved Hospital  Problem list     Assessment & Plan:  Acute respiratory failure with hypoxemia due to COVID 19 pneumonia: completed treatment  Continue to wean off O2 for O2 saturation > 85%  R pneumothorax: persistent small pneumothorax on 10/5 chest film on -20cm suction CT chest for better imaging of pulmonary parenchyma and chest tube placement Continue chest tube to -20 cm suction Explained to patient in detail that this process takes time to heal and at this point it is not appropriate to consider an invasive option, continue conservative measures   Best practice:   Per [facility]  Labs   CBC: Recent Labs  Lab 06/08/19 0411 06-28-2019 0248 06/10/19 0000 06/11/19 0404 06/12/19 0518  WBC 13.9* 13.6* 9.0 6.5 6.9  NEUTROABS 11.8* 12.5* 7.6 5.2 5.4  HGB 14.6 15.0 15.3 15.5 15.2  HCT 44.7 44.8 45.8 46.6 45.4  MCV 83.9 82.5 83.1 83.4 83.5  PLT 347 305 300 221 299    Basic Metabolic Panel: Recent Labs  Lab 06/08/19 0411 06/28/19 0248 06/10/19 0000 06/11/19 0404 06/12/19 0518  NA 132* 131* 133* 132* 133*  K 4.6 5.0 4.7 4.6 5.5*  CL 102 102 103 102 102  CO2 22 21* 22 23 27   GLUCOSE 102* 148* 93 95 109*  BUN 38* 34* 32* 38* 37*  CREATININE 1.04 0.98 0.90 0.98 1.06  CALCIUM 8.5* 8.5* 8.6* 8.4* 8.6*   GFR: Estimated Creatinine Clearance: 67.9 mL/min (by C-G formula based on SCr of  1.06 mg/dL). Recent Labs  Lab 05/10/2019 2358  06/09/19 0248 06/10/19 0000 06/11/19 0404 06/12/19 0518  PROCALCITON <0.10  --   --   --   --   --   WBC  --    < > 13.6* 9.0 6.5 6.9   < > = values in this interval not displayed.    Liver Function Tests: Recent Labs  Lab 06/08/19 0411 06/09/19 0248 06/10/19 0000 06/11/19 0404 06/12/19 0518  AST 95* 97* 137* 138* 110*  ALT 205* 265* 367* 452* 464*  ALKPHOS 67 70 66 67 59  BILITOT 0.9 0.7 1.1 1.0 0.8  PROT 5.6* 5.7* 5.7* 5.5* 5.5*  ALBUMIN 3.0* 3.1* 3.1* 3.0* 3.1*   No results for input(s): LIPASE, AMYLASE in the last 168 hours. No results for  input(s): AMMONIA in the last 168 hours.  ABG    Component Value Date/Time   TCO2 21 (L) 05/13/2018 0925     Coagulation Profile: No results for input(s): INR, PROTIME in the last 168 hours.  Cardiac Enzymes: No results for input(s): CKTOTAL, CKMB, CKMBINDEX, TROPONINI in the last 168 hours.  HbA1C: Hgb A1c MFr Bld  Date/Time Value Ref Range Status  02/07/2019 09:54 AM 5.4 4.6 - 6.5 % Final    Comment:    Glycemic Control Guidelines for People with Diabetes:Non Diabetic:  <6%Goal of Therapy: <7%Additional Action Suggested:  >8%   12/28/2017 08:59 AM 5.9 4.6 - 6.5 % Final    Comment:    Glycemic Control Guidelines for People with Diabetes:Non Diabetic:  <6%Goal of Therapy: <7%Additional Action Suggested:  >8%     CBG: No results for input(s): GLUCAP in the last 168 hours.   Critical care time: n/a       Roselie Awkward, MD [place] [place] [facility] Pager: [phone] Cell: [phone] If no response, call [phone]

## 2019-06-13 NOTE — Progress Notes (Signed)
PROGRESS NOTE  [name] [name]  LOV:[id number] DOB: 12-Aug-1941 DOA: 06/05/2019 PCP: Biagio Borg, MD  Outpatient Specialists: Vascular Surgery, Dr. Carlis Abbott; Interventional cardiology, Dr. Gwenlyn Found Brief Narrative: [name] [name] is a 78 y.o. male with a history of PVD, COPD, HTN, HLD, ulcerative colitis, and GERD with Barrett's esophagus who was initially admitted for covid 1/19 - 9/28. On discharge after receiving steroids, tocilizumab and remdesivir, he required 4L supplemental O2 and became more short of breath and hypoxic once he arrived home. He returned to the ED and was admitted 9/29 requiring 10L HFNC with unchanged CXR infiltrates. D-dimer was elevated from prior. Therapeutic anticoagulation was started after discussion with the patient and daughter while awaiting diagnostic studies. On 10/1, hypoxia has worsened and chest pain developed, found to have spontaneous PTX treated in the ICU with 14Fr pigtail drain with improvement. Subsequently transferred back to PCU 10/2, changed to water seal 10/3. PTX remains 10/5, CT pending.  Assessment & Plan: Principal Problem:   Acute respiratory disease due to COVID-19 virus Active Problems:   Essential hypertension   Chronic universal ulcerative colitis ([place])   Acute respiratory failure with hypoxia (HCC)   Elevated liver enzymes   Pneumonia due to COVID-19 virus   Pneumothorax on right   COPD (chronic obstructive pulmonary disease) (HCC)   Ulcerative colitis ([place])  Acute hypoxic respiratory failure due to covid-19 pneumonia: Received tocilizumab 9/25, remdesivir 9/19 - 9/23, and 10 days steroids on initial admission.  - Has completed full course of therapies as above. CRP remains negative, will stop checking.  - Wean oxygen to maintain some permissive hypoxia.  - Continue vitamin C and zinc.  - Prone, IS, OOB encouraged - Avoid NSAIDs, remain euvolemic - Strongly suspect given atherosclerotic burden that peripheral pulse oximetry is bound to be  less reliable.  Spontaneous right-sided PTX: s/p 14 Fr. chest tube placed 10/1. Clinically and radiographically improved.  - Chest tube management per PCCM. PTX remains on CXR this AM. CT recommended.   Elevated d-dimer: Appears to have stabilized. As previously discussed, CTA chest is complicated by contrast allergy and claustrophobia. V/Q scan certain to be nondiagnostic.  - LE dopplers negative - Will decrease to prophylactic anticoagulation based on improvement following CT placement.  Hyperkalemia:  - Resolved with lokelma x1 dose. Continue telemetry.   Chest pain: Due to PTX, improved. No pain is at site of chest tube, will treat symptomatically and remove tube when able.   Elevated LFTs: Not symptomatic, ?If delayed effect of tocilizumab/remdesivir and/or covid-related (felt less likely) - RUQ U/S showed echogenic liver without other findings on limited exam. No cholestatic findings on labs, no abd tenderness. Will continue to trend LFTs which seem to be plateu-ing with some improvement in ALT 10/5.   COPD: No exacerbation/indication for steroids.  - Continue symbicort and prn BDs (MDI)  PVD: Extensive vasculopathy s/p stenting.  - Continue plavix  AKI: Improving.   HTN:  - Continue HCTZ, lisinopril  Hyponatremia: Suspect due to thiazide, ACEi.  - Monitor  GERD:  - Continue PPI  Ulcerative colitis: Quiescent.  - Continue mesalamine.   Obesity: BMI 34.   Insomnia: Worsened while admitted - Pt reports being on 24m ambien dosing for 20 years with no history of adverse events/night time falls, etc. 565mwas ordered due to safety concerns given his age, though this has been ineffective. Alternative tried last night without improvement. Despite the risk of falling which is very high for him and the risk of that  fall having severe health consequences, he desires switch back to 66m dose which I have ordered.   DVT prophylaxis: Lovenox Code Status: Full code. Family  Communication: Daughter by phone Disposition Plan: Continue inpatient management.  Consultants:   None  Procedures:   Right chest tube insertion 10/1.   Antimicrobials:  None   Subjective: Very frustrated yesterday with prolonged hospitalization and failure to improve. Didn't sleep well despite benadryl [place] to [place] Chest pain on the right is resolved. Breathing currently better, but worsens intermittently, mostly with exertion.  Objective: Vitals:   06/13/19 0000 06/13/19 0100 06/13/19 0600 06/13/19 0833  BP: (!) 107/49  (!) 120/59 (!) 115/57  Pulse: 71  66 73  Resp: (!) 23 15 20 17   Temp: 97.6 F (36.4 C)  97.7 F (36.5 C) (!) 97.5 F (36.4 C)  TempSrc: Oral  Oral Oral  SpO2: 100% 99% 94% 96%  Weight:      Height:        Intake/Output Summary (Last 24 hours) at 06/13/2019 0959 Last data filed at 06/13/2019 0650 Gross per 24 hour  Intake 360 ml  Output 875 ml  Net -515 ml   Gen: 78y.o. male in no distress Pulm: Nonlabored breathing supplemental oxygen. Crackles bilaterally, no wheezes. CV: Regular rate and rhythm. No murmur, rub, or gallop. No JVD, no dependent edema. GI: Abdomen soft, non-tender, non-distended, with normoactive bowel sounds.  Ext: Warm, no deformities Skin: No rashes, lesions or ulcers on visualized skin. Neuro: Alert and oriented. No focal neurological deficits. Psych: Judgement and insight appear fair. Mood euthymic & affect congruent. Behavior is appropriate.     Data Reviewed: I have personally reviewed following labs and imaging studies  CBC: Recent Labs  Lab 06/08/19 0411 06/09/19 0248 06/10/19 0000 06/11/19 0404 06/12/19 0518  WBC 13.9* 13.6* 9.0 6.5 6.9  NEUTROABS 11.8* 12.5* 7.6 5.2 5.4  HGB 14.6 15.0 15.3 15.5 15.2  HCT 44.7 44.8 45.8 46.6 45.4  MCV 83.9 82.5 83.1 83.4 83.5  PLT 347 305 300 221 2831  Basic Metabolic Panel: Recent Labs  Lab 06/09/19 0248 06/10/19 0000 06/11/19 0404 06/12/19 0518  06/13/19 0335  NA 131* 133* 132* 133* 131*  K 5.0 4.7 4.6 5.5* 4.4  CL 102 103 102 102 98  CO2 21* 22 23 27 25   GLUCOSE 148* 93 95 109* 97  BUN 34* 32* 38* 37* 30*  CREATININE 0.98 0.90 0.98 1.06 0.96  CALCIUM 8.5* 8.6* 8.4* 8.6* 8.6*   GFR: Estimated Creatinine Clearance: 74.9 mL/min (by C-G formula based on SCr of 0.96 mg/dL). Liver Function Tests: Recent Labs  Lab 06/09/19 0248 06/10/19 0000 06/11/19 0404 06/12/19 0518 06/13/19 0335  AST 97* 137* 138* 110* 110*  ALT 265* 367* 452* 464* 427*  ALKPHOS 70 66 67 59 60  BILITOT 0.7 1.1 1.0 0.8 1.1  PROT 5.7* 5.7* 5.5* 5.5* 5.6*  ALBUMIN 3.1* 3.1* 3.0* 3.1* 3.3*   Radiology Studies: Dg Chest Port 1 View  Result Date: 06/13/2019 CLINICAL DATA:  Shortness of breath EXAM: PORTABLE CHEST 1 VIEW COMPARISON:  06/12/2019 FINDINGS: Cardiac shadow is stable. Patchy bilateral infiltrates are again noted and stable. Pigtail catheter is noted on the right. Tiny right lateral pneumothorax is noted. No new focal abnormality is noted. IMPRESSION: Stable appearance of the chest when compared with the prior day. Electronically Signed   By: MInez CatalinaM.D.   On: 06/13/2019 07:56   Dg Chest Port 1 View  Result Date: 06/12/2019  CLINICAL DATA:  Right-sided pneumothorax EXAM: PORTABLE CHEST 1 VIEW COMPARISON:  06/11/2019 FINDINGS: Right-sided pigtail pleural catheter in unchanged position. Small residual right apical pneumothorax measuring less than 10%. Bilateral upper and lower lung peripheral airspace disease. No pleural effusion or pneumothorax. Stable cardiomediastinal silhouette. No aggressive osseous lesion. IMPRESSION: 1. Right-sided pigtail pleural catheter in unchanged position. Small right apical pneumothorax. 2. Multifocal pneumonia. Electronically Signed   By: Kathreen Devoid   On: 06/12/2019 11:49   US Abdomen Limited Ruq  Result Date: 06/12/2019 CLINICAL DATA:  Elevated liver function tests.  COVID-19 positive. EXAM: ULTRASOUND ABDOMEN  LIMITED RIGHT UPPER QUADRANT COMPARISON:  Abdomen pelvis CT dated 12/07/2009. FINDINGS: Gallbladder: Unable to be visualized due to the fact that the patient could not lie on his back and there was tubing overlying the area of the gallbladder, preventing scanning of that area. Common bile duct: Diameter: Not visualized due to reasons discussed above. Liver: Only a small portion was visualized and was echogenic. The portal vein could not be visualized due to the overlying tubing. Other: None. IMPRESSION: Very limited examination due to lack of a sufficient sonographic window to the right upper quadrant of the abdomen, as discussed above. Only a small portion of the liver was visualized and was echogenic, possibly due to steatosis. Electronically Signed   By: Claudie Revering M.D.   On: 06/12/2019 18:36    Scheduled Meds:  Chlorhexidine Gluconate Cloth  6 each Topical Daily   clopidogrel  75 mg Oral Daily   enoxaparin (LOVENOX) injection  40 mg Subcutaneous Q12H   gabapentin  100 mg Oral QHS   mesalamine  1.2 g Oral QHS   pantoprazole  40 mg Oral Daily   sodium zirconium cyclosilicate  10 g Oral Daily   cyanocobalamin  1,000 mcg Oral Daily   Continuous Infusions:   LOS: 6 days   Time spent: 35 minutes.  Patrecia Pour, MD [facility] [email or web address] Password [email or web address] 06/13/2019, 9:59 AM

## 2019-06-13 NOTE — Progress Notes (Signed)
OT Cancellation Note  Patient Details Name: [name] [name] MRN: [id number] DOB: 22-Nov-1940   Cancelled Treatment:    Reason Eval/Treat Not Completed: Other (comment); pt declined, reports fatigued this PM and was just recently up to [facility], is now awaiting ambulance transport to [facility] for CT. Pt very down during interaction with OT, provided emotional support and also inquired if pt would like someone to reach out/talk to him but he declined at this time. Will follow up for OT eval as schedule permits.  Lou Cal, OT [facility] Pager [phone] Office [phone]   Raymondo Band 06/13/2019, 3:42 PM

## 2019-06-13 NOTE — Consult Note (Signed)
   [facility] CM Inpatient Consult   06/13/2019  [name] [birth year] [id number]    Patient screened for readmission within 7 days, and high risk scoreof 22%for unplanned readmission and hospitalizations, with a 30 day readmission, under his [employer].  Review of patient's medical record and MD history and physical dated 05/15/2019 reveals that:    [name] [name] is a 78 y.o. male with medical history significant of hypertension, hyperlipidemia, COPD, peripheral vascular disease, Barrett's esophagus, and GERD.   Patient was just admitted from 9/19-9/28 at [facility] for COVID pneumonia. On discharge he was still apparently desaturating with ambulation and requiring 4L with ambulation. He apparently was very ill and SOB after getting home. Daughter called EMS. Despite being on 4L at all times at home, he was satting 60-80%. He returned to the ED and was admitted 9/29 requiring 10L HFNC with unchanged CXR infiltrates. Found to have spontaneous PTX treated in the ICU with 14Fr pigtail drain with improvement. Subsequently transferred back to PCU 10/2, changed to water seal 10/3. PTX remains 10/5, CT pending.  Primary Care Provider isDr. [name] with [place] at [facility], listed as providing transition of care follow-up.  Review of PT note, with current recommendation for home with home health PT vs. SNF (skilled nursing facility).  Plan:Will continue to follow with Inpatient TOC teamfor needs. Continue to follow progress and disposition to assess for post hospital care management needs.  Of note, [facility] Care Management services does not replace or interfere with any services that are arranged by transition of care case management or social work.     For questions and referral, please contact:  Micole Delehanty A. Aleira Deiter, BSN, RN-BC [facility] Liaison Cell: [phone]

## 2019-06-14 ENCOUNTER — Inpatient Hospital Stay (HOSPITAL_COMMUNITY): Payer: Medicare Other

## 2019-06-14 LAB — COMPREHENSIVE METABOLIC PANEL
ALT: 368 U/L — ABNORMAL HIGH (ref 0–44)
AST: 97 U/L — ABNORMAL HIGH (ref 15–41)
Albumin: 3.1 g/dL — ABNORMAL LOW (ref 3.5–5.0)
Alkaline Phosphatase: 59 U/L (ref 38–126)
Anion gap: 9 (ref 5–15)
BUN: 27 mg/dL — ABNORMAL HIGH (ref 8–23)
CO2: 25 mmol/L (ref 22–32)
Calcium: 8.3 mg/dL — ABNORMAL LOW (ref 8.9–10.3)
Chloride: 97 mmol/L — ABNORMAL LOW (ref 98–111)
Creatinine, Ser: 0.91 mg/dL (ref 0.61–1.24)
GFR calc Af Amer: >60 mL/min (ref >60)
GFR calc non Af Amer: >60 mL/min (ref >60)
Glucose, Bld: 92 mg/dL (ref 70–99)
Potassium: 4.1 mmol/L (ref 3.5–5.1)
Sodium: 131 mmol/L — ABNORMAL LOW (ref 135–145)
Total Bilirubin: 0.8 mg/dL (ref 0.3–1.2)
Total Protein: 5.2 g/dL — ABNORMAL LOW (ref 6.5–8.1)

## 2019-06-14 LAB — D-DIMER, QUANTITATIVE: D-Dimer, Quant: 1.28 ug/mL-FEU — ABNORMAL HIGH (ref 0.00–0.50)

## 2019-06-14 LAB — HIV ANTIBODY (ROUTINE TESTING W REFLEX): HIV Screen 4th Generation wRfx: NONREACTIVE

## 2019-06-14 LAB — HEPATITIS PANEL, ACUTE
HCV Ab: NONREACTIVE
Hep A IgM: NONREACTIVE
Hep B C IgM: NONREACTIVE
Hepatitis B Surface Ag: NONREACTIVE

## 2019-06-14 MED ORDER — PNEUMOCOCCAL VAC POLYVALENT 25 MCG/0.5ML IJ INJ
0.5000 mL | INJECTION | INTRAMUSCULAR | Status: DC
  Filled 2019-06-14: qty 0.5

## 2019-06-14 MED ORDER — INFLUENZA VAC A&B SA ADJ QUAD 0.5 ML IM PRSY
0.5000 mL | PREFILLED_SYRINGE | INTRAMUSCULAR | Status: DC
  Filled 2019-06-14: qty 0.5

## 2019-06-14 NOTE — Progress Notes (Signed)
Physical Therapy Treatment Patient Details Name: [name] [name] MRN: [id number] DOB: 1941-08-26 Today's Date: 06/14/2019    History of Present Illness Pt is a 78 y.o. male recently discharged hom from [place] on 9/28, was only home a few hours then readmitted 05/15/2019 with hypoxia. PMH includes PVC, HTN, HLD, Barrett's esophagus, ulcerative colitis.   PT Comments    Pt slowly progressing with mobility. Today, pt limited by frustration regarding current functional status, specifically his SOB with minimal activity and wanting chest tube removed. Pt moving well, but requires prolonged time to recover from [place] with minimal activity; continue to encourage pt to perform as many mobility and ADL tasks as independently as possible in spite of fatigue. Reinforced educ re: current condition, importance of mobility, energy conservation and activity recommendations.   Follow Up Recommendations  Home health PT;Supervision for mobility/OOB(pending pt progression)     Equipment Recommendations  (TBD)    Recommendations for Other Services       Precautions / Restrictions Precautions Precautions: Fall;Other (comment) Precaution Comments: Chest tube, watch SpO2 Restrictions Weight Bearing Restrictions: No    Mobility  Bed Mobility Overal bed mobility: Modified Independent             General bed mobility comments: Insistent on HOB elevated even though explained pt will not have this at home; mod indep with HOB slightly elevated and use of bed rail. Indep to return to supine with bed flat  Transfers Overall transfer level: Needs assistance Equipment used: None Transfers: Sit to/from Stand Sit to Stand: Supervision         General transfer comment: Able to stand multiple times from bed and BSC, supervision for safety. Pt quick to get SOB and seems anxious/frustrated by this after minimal movement; despite this, moving well  Ambulation/Gait Ambulation/Gait assistance: Supervision Gait  Distance (Feet): 4 Feet Assistive device: None Gait Pattern/deviations: Step-through pattern;Decreased stride length;Trunk flexed Gait velocity: Decreased   General Gait Details: Steps from bed<>BSC without device, supervision for safety with lines. Pt declining further distance due to fatigue/SOB   Stairs             Wheelchair Mobility    Modified Rankin (Stroke Patients Only)       Balance Overall balance assessment: Needs assistance Sitting-balance support: Feet supported Sitting balance-Leahy Scale: Good     Standing balance support: No upper extremity supported Standing balance-Leahy Scale: Fair Standing balance comment: Able to stand and perform partial squat to attempt posterior pericare, eventually requiring assist to complete task                            Cognition Arousal/Alertness: Awake/alert Behavior During Therapy: [facility] for tasks assessed/performed;Anxious Overall Cognitive Status: No family/caregiver present to determine baseline cognitive functioning                                 General Comments: Per previous chart, "decreased STM, likes things written down." Today, pt generally unhappy with mobility, seems somewhat anxious and required increased time/education/encouragement on need for mobility      Exercises Other Exercises Other Exercises: Pt encouraged to perform posterior pericare becoming frustrated stating, "they do that for me I can't." Attempted to explain PT's reasoning for asking that pt try; pt willing to attempt but gave up stating he could not reach fully. Able to perform partial squat and lean over to bed well  for this. DOE with minimal activity    General Comments        Pertinent Vitals/Pain Pain Assessment: Faces Faces Pain Scale: Hurts a little bit Pain Location: Chest tube insertion Pain Descriptors / Indicators: Discomfort;Sore Pain Intervention(s): Monitored during session    Home Living                       Prior Function            PT Goals (current goals can now be found in the care plan section) Progress towards PT goals: Not progressing toward goals - comment(self-limiting, limited by fatigue/SOB)    Frequency    Min 3X/week      PT Plan Current plan remains appropriate    Co-evaluation              AM-PAC PT "6 Clicks" Mobility   Outcome Measure  Help needed turning from your back to your side while in a flat bed without using bedrails?: A Little Help needed moving from lying on your back to sitting on the side of a flat bed without using bedrails?: A Little Help needed moving to and from a bed to a chair (including a wheelchair)?: A Little Help needed standing up from a chair using your arms (e.g., wheelchair or bedside chair)?: A Little Help needed to walk in hospital room?: A Little Help needed climbing 3-5 steps with a railing? : A Lot 6 Click Score: 17    End of Session Equipment Utilized During Treatment: Oxygen Activity Tolerance: Patient limited by fatigue;Other (comment) Patient left: in bed;with call bell/phone within reach Nurse Communication: Mobility status PT Visit Diagnosis: Other abnormalities of gait and mobility (R26.89);Muscle weakness (generalized) (M62.81)     Time: 5809-9833 PT Time Calculation (min) (ACUTE ONLY): 23 min  Charges:  $Therapeutic Activity: 23-37 mins                    Mabeline Caras, PT, DPT Acute Rehabilitation Services  Pager [phone] Office [place] 06/14/2019, 4:45 PM

## 2019-06-14 NOTE — Progress Notes (Signed)
PROGRESS NOTE  [name] [name]  CWC:[id number] DOB: [birth year] DOA: 06/01/2019 PCP: Biagio Borg, MD  Outpatient Specialists: Vascular Surgery, Dr. Carlis Abbott; Interventional cardiology, Dr. Gwenlyn Found Brief Narrative: [name] [name] is a 78 y.o. male with a history of PVD, COPD, HTN, HLD, ulcerative colitis, and GERD with Barrett's esophagus who was initially admitted for covid 1/19 - 9/28. On discharge after receiving steroids, tocilizumab and remdesivir, he required 4L supplemental O2 and became more short of breath and hypoxic once he arrived home. He returned to the ED and was admitted 9/29 requiring 10L HFNC with unchanged CXR infiltrates. D-dimer was elevated from prior. Therapeutic anticoagulation was started after discussion with the patient and daughter while awaiting diagnostic studies. On 10/1, hypoxia has worsened and chest pain developed, found to have spontaneous PTX treated in the ICU with 14Fr pigtail drain with improvement. Subsequently transferred back to PCU 10/2, changed to water seal 10/3. PTX remains 10/5, CT pending.  Assessment & Plan: Principal Problem:   Acute respiratory disease due to COVID-19 virus Active Problems:   Essential hypertension   Chronic universal ulcerative colitis ([place])   Acute respiratory failure with hypoxia (HCC)   Elevated liver enzymes   Pneumonia due to COVID-19 virus   Pneumothorax on right   COPD (chronic obstructive pulmonary disease) (HCC)   Ulcerative colitis ([place])  Acute hypoxic respiratory failure due to covid-19 pneumonia: Received tocilizumab 9/25, remdesivir 9/19 - 9/23, and 10 days steroids on initial admission.  - Has completed full course of therapies as above. CRP remains negative, will stop checking.  - Wean oxygen to maintain some permissive hypoxia.  - Continue vitamin C and zinc.  - Prone, IS, OOB encouraged - Avoid NSAIDs, remain euvolemic - Strongly suspect given atherosclerotic burden that peripheral pulse oximetry is bound to be  less reliable.  Spontaneous right-sided PTX: s/p 14 Fr. chest tube placed 10/1. Clinically and radiographically improved.  - Chest tube management per PCCM. PTX remains on CXR this AM. CT recommended.   Elevated d-dimer: Appears to have stabilized. As previously discussed, CTA chest is complicated by contrast allergy and claustrophobia. V/Q scan certain to be nondiagnostic.  - LE dopplers negative - Will decrease to prophylactic anticoagulation based on improvement following CT placement.  Hyperkalemia:  - Resolved with lokelma x1 dose. Continue telemetry.   Chest pain: Due to PTX, improved. No pain is at site of chest tube, will treat symptomatically and remove tube when able.   Elevated LFTs: Not symptomatic, ?If delayed effect of tocilizumab/remdesivir and/or covid-related (felt less likely).  RUQ U/S showed echogenic liver without other findings on limited exam. No cholestatic findings on labs, no abd tenderness.  - Hepatitis panel pending. - Will continue to trend LFTs. Improving.  COPD: No exacerbation/indication for steroids.  - Continue symbicort and prn BDs (MDI)  PVD: Extensive vasculopathy s/p stenting.  - Continue plavix  AKI: Improving.   HTN:  - Continue HCTZ, lisinopril  Hyponatremia: Suspect due to thiazide, ACEi. Stable. - Monitor  GERD:  - Continue PPI  Ulcerative colitis: Quiescent.  - Continue mesalamine.   Obesity: BMI 34.   Insomnia: Worsened while admitted - Pt reports being on 40m ambien dosing for 20 years with no history of adverse events/night time falls, etc. this has been reordered.   DVT prophylaxis: Lovenox Code Status: Full code. Family Communication: Daughter by phone daily Disposition Plan: Continue inpatient management. Removing chest tube today. Social work consulted for SNF placement.  Consultants:   None  Procedures:  Right chest tube insertion 10/1.   Antimicrobials:  None   Subjective: Dyspnea has improved over  past 24 hours, no chest pain currently. Feels weak with movements and agrees to need for rehabilitation prior to going home.  Objective: Vitals:   06/14/19 0800 06/14/19 0824 06/14/19 1300 06/14/19 1452  BP:  (!) 123/56  (!) 106/44  Pulse:  78  80  Resp:  (!) 23  19  Temp: 97.7 F (36.5 C)  (!) 97 F (36.1 C)   TempSrc: Oral     SpO2:  (!) 88%  100%  Weight:      Height:        Intake/Output Summary (Last 24 hours) at 06/14/2019 1605 Last data filed at 06/14/2019 1400 Gross per 24 hour  Intake 1210 ml  Output 1301 ml  Net -91 ml   Gen: 78 y.o. male in no distress Pulm: Nonlabored breathing supplemental oxygen at rest. Crackles bilaterally. CV: Regular rate and rhythm. No murmur, rub, or gallop. No JVD, no dependent edema. GI: Abdomen soft, non-tender, non-distended, with normoactive bowel sounds.  Ext: Warm, no deformities Skin: No new rashes, lesions or ulcers on visualized skin. Neuro: Alert and oriented. No focal neurological deficits. Psych: Judgement and insight appear fair. Mood euthymic & affect congruent. Behavior is appropriate.    Data Reviewed: I have personally reviewed following labs and imaging studies  CBC: Recent Labs  Lab 06/08/19 0411 06/09/19 0248 06/10/19 0000 06/11/19 0404 06/12/19 0518  WBC 13.9* 13.6* 9.0 6.5 6.9  NEUTROABS 11.8* 12.5* 7.6 5.2 5.4  HGB 14.6 15.0 15.3 15.5 15.2  HCT 44.7 44.8 45.8 46.6 45.4  MCV 83.9 82.5 83.1 83.4 83.5  PLT 347 305 300 221 893   Basic Metabolic Panel: Recent Labs  Lab 06/10/19 0000 06/11/19 0404 06/12/19 0518 06/13/19 0335 06/14/19 0140  NA 133* 132* 133* 131* 131*  K 4.7 4.6 5.5* 4.4 4.1  CL 103 102 102 98 97*  CO2 22 23 27 25 25   GLUCOSE 93 95 109* 97 92  BUN 32* 38* 37* 30* 27*  CREATININE 0.90 0.98 1.06 0.96 0.91  CALCIUM 8.6* 8.4* 8.6* 8.6* 8.3*   GFR: Estimated Creatinine Clearance: 79 mL/min (by C-G formula based on SCr of 0.91 mg/dL). Liver Function Tests: Recent Labs  Lab 06/10/19  0000 06/11/19 0404 06/12/19 0518 06/13/19 0335 06/14/19 0140  AST 137* 138* 110* 110* 97*  ALT 367* 452* 464* 427* 368*  ALKPHOS 66 67 59 60 59  BILITOT 1.1 1.0 0.8 1.1 0.8  PROT 5.7* 5.5* 5.5* 5.6* 5.2*  ALBUMIN 3.1* 3.0* 3.1* 3.3* 3.1*   Radiology Studies: Ct Chest Wo Contrast  Result Date: 06/14/2019 CLINICAL DATA:  COVID-19 positive.  Pneumothorax. EXAM: CT CHEST WITHOUT CONTRAST TECHNIQUE: Multidetector CT imaging of the chest was performed following the standard protocol without IV contrast. COMPARISON:  Radiograph 06/13/2011 FINDINGS: Cardiovascular: Coronary artery calcification and aortic atherosclerotic calcification. Mediastinum/Nodes: No axillary or supraclavicular adenopathy. No mediastinal hilar adenopathy. No pericardial effusion. Esophagus normal Lungs/Pleura: RIGHT chest tube in place with tip coiled in the upper hemithorax along the oblique fissure. Minimal residual pneumothorax in the medial RIGHT apex (image 21 to 28 of series 5). No pneumothorax noted along the lateral chest wall. There is underlying the patchy confluent airspace disease within the periphery of the upper lobes, middle lobes in lower lobes. Air bronchograms in the lingula, LEFT lower lobe and RIGHT lower lobe. Air bronchograms in the RIGHT upper lobe additionally. No bronchiectasis.  No pleural  fluid Upper Abdomen: Limited view of the liver, kidneys, pancreas are unremarkable. Normal adrenal glands. Musculoskeletal: No aggressive osseous lesion IMPRESSION: 1. Minimal RIGHT apical pneumothorax with chest tube in place. 2. Patchy confluent airspace disease in the periphery of the RIGHT and LEFT lung consistent with COVID-19 viral pneumonia. Electronically Signed   By: Suzy Bouchard M.D.   On: 06/14/2019 08:24   Dg Chest Port 1 View  Result Date: 06/14/2019 CLINICAL DATA:  RIGHT pneumothorax, follow-up EXAM: PORTABLE CHEST 1 VIEW COMPARISON:  Portable exam 1155 hours compared to 06/13/2019 FINDINGS: Pigtail  RIGHT thoracostomy tube again seen. Normal heart size, mediastinal contours, and pulmonary vascularity. Atherosclerotic calcification aorta. Patchy infiltrates in both lungs persist. Previously identified RIGHT pneumothorax no longer seen. No pleural effusion or acute osseous findings. IMPRESSION: RIGHT thoracostomy tube with resolution of pneumothorax since prior study. BILATERAL pulmonary infiltrates, unchanged. Electronically Signed   By: Lavonia Dana M.D.   On: 06/14/2019 12:14   Dg Chest Port 1 View  Result Date: 06/13/2019 CLINICAL DATA:  Shortness of breath EXAM: PORTABLE CHEST 1 VIEW COMPARISON:  06/12/2019 FINDINGS: Cardiac shadow is stable. Patchy bilateral infiltrates are again noted and stable. Pigtail catheter is noted on the right. Tiny right lateral pneumothorax is noted. No new focal abnormality is noted. IMPRESSION: Stable appearance of the chest when compared with the prior day. Electronically Signed   By: Inez Catalina M.D.   On: 06/13/2019 07:56   US Abdomen Limited Ruq  Result Date: 06/12/2019 CLINICAL DATA:  Elevated liver function tests.  COVID-19 positive. EXAM: ULTRASOUND ABDOMEN LIMITED RIGHT UPPER QUADRANT COMPARISON:  Abdomen pelvis CT dated 12/07/2009. FINDINGS: Gallbladder: Unable to be visualized due to the fact that the patient could not lie on his back and there was tubing overlying the area of the gallbladder, preventing scanning of that area. Common bile duct: Diameter: Not visualized due to reasons discussed above. Liver: Only a small portion was visualized and was echogenic. The portal vein could not be visualized due to the overlying tubing. Other: None. IMPRESSION: Very limited examination due to lack of a sufficient sonographic window to the right upper quadrant of the abdomen, as discussed above. Only a small portion of the liver was visualized and was echogenic, possibly due to steatosis. Electronically Signed   By: Claudie Revering M.D.   On: 06/12/2019 18:36     Scheduled Meds: . Chlorhexidine Gluconate Cloth  6 each Topical Daily  . clopidogrel  75 mg Oral Daily  . enoxaparin (LOVENOX) injection  40 mg Subcutaneous Q12H  . gabapentin  100 mg Oral QHS  . mesalamine  1.2 g Oral QHS  . pantoprazole  40 mg Oral Daily  . sodium zirconium cyclosilicate  10 g Oral Daily  . cyanocobalamin  1,000 mcg Oral Daily   Continuous Infusions:   LOS: 7 days   Time spent: 25 minutes.  Patrecia Pour, MD [facility] [email or web address] Password [email or web address] 06/14/2019, 4:05 PM

## 2019-06-14 NOTE — Progress Notes (Signed)
[facility] PCCM  CXR reviewed, no pneumothorax  Will order d/c chest tube  Repeat CXR [place], MD [place] PCCM Pager: [phone] Cell: [phone] If no response, call [phone]

## 2019-06-14 NOTE — Progress Notes (Signed)
This 78 y/o male presents for readmission after recent d/c home. Pt currently with functional deficits including weakness, decreased activity tolerance impacting his functional performance. Pt OOB in recliner upon arrival having completed morning ADL with RN assist. Pt engaged in additional seated UB/LB exercise using level 2 theraband for continued strengthening/endurance as precursor to performing ADL/functional transfers. Pt on 1L HFNC with seated activity with SpO2 >90%. He will benefit from continued acute OT services, currently recommend follow up [facility] services after discharge however will need to monitor for pt progress. Will follow.      06/14/19 1111  OT Visit Information  Last OT Received On 06/14/19  Assistance Needed +1  History of Present Illness  77 y.o. M, PVD s/p right fem endarterectomy and stent, Ulcerative colitis, Barrett's esophagus, GERD,HLD, [place], HypoNa and HTN who presented (9/18) with N/V/D for several weeks. Was admitted to [facility] for COVID tx and d/c home 09/28, states he was only home few hours prior to return to ED with hypoxia on 09/28.  Precautions  Precautions Fall;Other (comment) (chest tube in place, monitor 02 sats)  Precaution Comments Desats with activity  Restrictions  Weight Bearing Restrictions No  Home Living  Family/patient expects to be discharged to: Private residence  Living Arrangements Children  Available Help at Discharge Family;Available PRN/intermittently  Type of Home House  Home Access Stairs to enter  Entrance Stairs-Number of Steps 2  Entrance Stairs-Rails Can reach both  Home Layout One level  Engineer, manufacturing systems Yes  How Accessible Accessible via walker  [place] - 2 wheels;Shower seat - built in;Hand held shower head;Grab bars - tub/shower  Additional Comments has RW but doesn't use it   Prior Function  Level of Independence Independent  Comments wsa  just d/c home 2 days prior to readmit to this facility  Communication  Communication No difficulties  Pain Assessment  Pain Assessment Faces  Faces Pain Scale 2  Pain Location L flank  Pain Descriptors / Indicators Discomfort;Sore  Pain Intervention(s) Monitored during session;Limited activity within patient's tolerance  Cognition  Arousal/Alertness Awake/alert  Behavior During Therapy WFL for tasks assessed/performed;Flat affect  Overall Cognitive Status History of cognitive impairments - at baseline  General Comments per previous admit, overall WFL for basic tasks today  Upper Extremity Assessment  Upper Extremity Assessment Generalized weakness  Lower Extremity Assessment  Lower Extremity Assessment Defer to PT evaluation  Cervical / Trunk Assessment  Cervical / Trunk Assessment Normal  ADL  Overall ADL's  Needs assistance/impaired  Eating/Feeding Set up;Sitting  Grooming Set up;Sitting  Upper Body Bathing Minimal assistance;Sitting  Upper Body Dressing  Minimal assistance;Sitting  General ADL Comments pt with limited activity tolerance today; had already gotten OOB to recliner with RN assist and completed bathing ADL. pt enaging in UB/LB exercise during this session   Bed Mobility  General bed mobility comments received OOB in recliner  Exercises  Exercises General Upper Extremity;General Lower Extremity;Other exercises  General Exercises - Upper Extremity  Shoulder Horizontal ABduction AROM;Both;10 reps;Theraband  Theraband Level (Shoulder Horizontal Abduction) Level 2 (Red)  Elbow Flexion AROM;Both;10 reps;Theraband  Theraband Level (Elbow Flexion) Level 2 (Red)  Elbow Extension AROM;Both;10 reps;Theraband  Theraband Level (Elbow Extension) Level 2 (Red)  Shoulder Flexion AROM;Both;10 reps;Theraband  Shoulder Horizontal ADduction AROM;Both;10 reps;Theraband  Theraband Level (Shoulder Flexion) Level 2 (Red)  Theraband Level (Shoulder Horizontal Adduction) Level 2 (Red)   General Exercises - Lower Extremity  Ankle Circles/Pumps AROM;Both;10 reps  Long Arc Quad AROM;Both;10 reps;Seated  Hip ABduction/ADduction AROM;Both;10 reps;Seated  Hip Flexion/Marching AROM;Both;10 reps;Seated  Other Exercises  Other Exercises use of IS with min cues for technique  OT - End of Session  Equipment Utilized During Treatment Oxygen  Activity Tolerance Patient tolerated treatment well;Patient limited by fatigue  Patient left in chair;with call bell/phone within reach  Nurse Communication Mobility status  OT Assessment  OT Recommendation/Assessment Patient needs continued OT Services  OT Problem List Decreased activity tolerance;Cardiopulmonary status limiting activity;Obesity;Decreased strength;Impaired balance (sitting and/or standing);Pain;Decreased knowledge of use of DME or AE  OT Plan  OT Frequency (ACUTE ONLY) Min 3X/week  OT Treatment/Interventions (ACUTE ONLY) Self-care/ADL training;Therapeutic exercise;Energy conservation;DME and/or AE instruction;Therapeutic activities;Patient/family education;Balance training  AM-PAC OT "6 Clicks" Daily Activity Outcome Measure (Version 2)  Help from another person eating meals? 4  Help from another person taking care of personal grooming? 3  Help from another person toileting, which includes using toliet, bedpan, or urinal? 2  Help from another person bathing (including washing, rinsing, drying)? 2  Help from another person to put on and taking off regular upper body clothing? 3  Help from another person to put on and taking off regular lower body clothing? 2  6 Click Score 16  OT Recommendation  Follow Up Recommendations Home health OT;Supervision/Assistance - 24 hour (pending progress)  OT Equipment None recommended by OT  Individuals Consulted  Consulted and Agree with Results and Recommendations Patient  Acute Rehab OT Goals  Patient Stated Goal get better and go home  OT Goal Formulation With patient  Time For Goal  Achievement 06/28/19  Potential to Achieve Goals Good  OT Time Calculation  OT Start Time (ACUTE ONLY) 1102  OT Stop Time (ACUTE ONLY) 1122  OT Time Calculation (min) 20 min  OT General Charges  $OT Visit 1 Visit  OT Evaluation  $OT Eval Moderate Complexity 1 Mod  Written Expression  Dominant Hand Right    Lou Cal, [place] Pager [phone] Office [phone]

## 2019-06-14 NOTE — Progress Notes (Signed)
[facility] PCCM  I reviewed [name] [name]'s CT scan which showed that there is a tiny apical pneumothorax and the chest tube is either in the fissure or in the pulmonary parenchyma, hard to tell.   He feels OK, oxygenation has improved  There is no air leak on exam today, also no tidal movement with breathing.   I have changed the chest tube to water seal.  Will plan to repeat a CXR later today and will likely remove the tube because it's not clear to me that it is effective at this point.    Roselie Awkward, MD [place] PCCM Pager: [phone] Cell: [phone] If no response, call [phone]

## 2019-06-15 ENCOUNTER — Inpatient Hospital Stay (HOSPITAL_COMMUNITY): Payer: Medicare Other

## 2019-06-15 LAB — COMPREHENSIVE METABOLIC PANEL
ALT: 323 U/L — ABNORMAL HIGH (ref 0–44)
AST: 76 U/L — ABNORMAL HIGH (ref 15–41)
Albumin: 3.2 g/dL — ABNORMAL LOW (ref 3.5–5.0)
Alkaline Phosphatase: 58 U/L (ref 38–126)
Anion gap: 10 (ref 5–15)
BUN: 23 mg/dL (ref 8–23)
CO2: 21 mmol/L — ABNORMAL LOW (ref 22–32)
Calcium: 8.3 mg/dL — ABNORMAL LOW (ref 8.9–10.3)
Chloride: 99 mmol/L (ref 98–111)
Creatinine, Ser: 0.87 mg/dL (ref 0.61–1.24)
GFR calc Af Amer: >60 mL/min (ref >60)
GFR calc non Af Amer: >60 mL/min (ref >60)
Glucose, Bld: 118 mg/dL — ABNORMAL HIGH (ref 70–99)
Potassium: 4 mmol/L (ref 3.5–5.1)
Sodium: 130 mmol/L — ABNORMAL LOW (ref 135–145)
Total Bilirubin: 0.6 mg/dL (ref 0.3–1.2)
Total Protein: 5.3 g/dL — ABNORMAL LOW (ref 6.5–8.1)

## 2019-06-15 LAB — D-DIMER, QUANTITATIVE: D-Dimer, Quant: 1.25 ug/mL-FEU — ABNORMAL HIGH (ref 0.00–0.50)

## 2019-06-15 MED ORDER — FUROSEMIDE 10 MG/ML IJ SOLN
40.0000 mg | Freq: Two times a day (BID) | INTRAMUSCULAR | Status: AC
  Administered 2019-06-15 – 2019-06-16 (×3): 40 mg via INTRAVENOUS
  Filled 2019-06-15 (×3): qty 4

## 2019-06-15 MED ORDER — BETHANECHOL CHLORIDE 10 MG PO TABS
10.0000 mg | ORAL_TABLET | Freq: Three times a day (TID) | ORAL | Status: DC
  Administered 2019-06-15 – 2019-06-22 (×20): 10 mg via ORAL
  Filled 2019-06-15 (×24): qty 1

## 2019-06-15 NOTE — Progress Notes (Deleted)
Wife called and was updated on status. Pt was awake. Cell phone received from the nurses station's charging station. No other needs or concerns at this time. All questions answered.

## 2019-06-15 NOTE — Progress Notes (Signed)
[place] TEAM 1 - Stepdown/ICU TEAM  HYLAND MOLLENKOPF  IWO:[id number] DOB: [birth year] DOA: 05/10/2019 PCP: Biagio Borg, MD    Brief Narrative:  78 year old with a history of peripheral vascular disease, COPD, HTN, HLD, ulcerative colitis, and GERD with Barrett's esophagus who was admitted for COVID pneumonia 9/19 > 9/28.  He was discharged after receiving steroids, Actemra, and Remdesivir and was requiring 4 L supplemental O2 at the time.  He reported becoming much more short of breath and severely hypoxic after returning home.  He came back to the ED 9/29 at which time he required 10 L of high flow nasal cannula.  A CXR upon his return noted unchanged bilateral pulmonary infiltrates.  He was noted to have newly markedly elevated d-dimer.  Significant Events: 9/19 > 9/28 prior admit to [facility] 9/29 readmit to [facility] 10/1 spontaneous pneumothorax - chest tube placed 10/6 chest tube removed  COVID-19 specific Treatment: Actemra 9/25 Remdesivir 9/19 > 9/23 Decadron  Subjective: Saturations currently stable in the 90s on room air.  The patient reports some ongoing chest wall pain and is very frustrated that he still becomes very short of breath with physical exertion.  He states he has grown very tired of being in the hospital.  He denies nausea vomiting or abdominal pain.  Assessment & Plan:  COVID pneumonia - acute hypoxic respiratory failure Very slow to improve -follow-up chest x-ray today -has completed courses of Actemra, Remdesivir, and Decadron -is stable at rest but desaturates markedly with attempt to exert himself -attempt to diurese  Recent Labs  Lab 06/09/19 0248  06/10/19 0000 06/11/19 0404 06/12/19 0518 06/13/19 0335 06/14/19 0140 06/15/19 0145  DDIMER 2.69*   < > 2.43* 2.16* 1.66* 1.46* 1.28* 1.25*  CRP <0.8  --  <0.8 <0.8 <0.8  --   --   --   ALT 265*  --  367* 452* 464* 427* 368* 323*   < > = values in this interval not displayed.    Spontaneous  right pneumothorax 14 French pigtail chest tube placed 10/1 -chest tube removed 10/6 -with patient severe dyspnea on exertion today we will recheck chest x-ray to assure pneumothorax has not reaccumulated -physical exam however is not consistent with this  Elevated d-dimer CTA not able to be obtained due to contrast allergy and claustrophobia -bilateral lower extremity venous duplex negative -continue intermediate dose Lovenox for now  Transaminitis Ultrasound noted echogenic liver without other acute findings -viral hepatitis panel pending  Recent Labs  Lab 06/11/19 0404 06/12/19 0518 06/13/19 0335 06/14/19 0140 06/15/19 0145  AST 138* 110* 110* 97* 76*  ALT 452* 464* 427* 368* 323*  ALKPHOS 67 59 60 59 58  BILITOT 1.0 0.8 1.1 0.8 0.6  PROT 5.5* 5.5* 5.6* 5.2* 5.3*  ALBUMIN 3.0* 3.1* 3.3* 3.1* 3.2*    COPD Well compensated presently  Peripheral vascular disease No acute complications presently  Acute kidney injury Resolved with creatinine presently normal  HTN Blood pressure presently controlled  Hyponatremia Follow with diuresis  Ulcerative colitis Appears well compensated presently  GERD Continue home medical therapy  Morbid obesity - Estimated body mass index is 34.47 kg/m as calculated from the following:   Height as of this encounter: 5' 7.99" (1.727 m).   Weight as of this encounter: 102.8 kg.   DVT prophylaxis: Lovenox intermediate dose Code Status: FULL CODE Family Communication:  Disposition Plan:   Consultants:  none  Antimicrobials:  None  Objective: Blood pressure (!) 119/56, pulse  67, temperature 97.7 F (36.5 C), temperature source Oral, resp. rate 16, height 5' 7.99" (1.727 m), weight 102.8 kg, SpO2 93 %.  Intake/Output Summary (Last 24 hours) at 06/15/2019 1006 Last data filed at 06/15/2019 0630 Gross per 24 hour  Intake 490 ml  Output 1226 ml  Net -736 ml   Filed Weights   06/10/19 1223  Weight: 102.8 kg    Examination:  General: No acute respiratory distress Lungs: Fine crackles diffusely bilateral fields Cardiovascular: Regular rate and rhythm without murmur gallop or rub normal S1 and S2 Abdomen: Nontender, nondistended, soft, bowel sounds positive, no rebound, no ascites, no appreciable mass Extremities: No significant cyanosis, clubbing, or edema bilateral lower extremities  CBC: Recent Labs  Lab 06/10/19 0000 06/11/19 0404 06/12/19 0518  WBC 9.0 6.5 6.9  NEUTROABS 7.6 5.2 5.4  HGB 15.3 15.5 15.2  HCT 45.8 46.6 45.4  MCV 83.1 83.4 83.5  PLT 300 221 827   Basic Metabolic Panel: Recent Labs  Lab 06/13/19 0335 06/14/19 0140 06/15/19 0145  NA 131* 131* 130*  K 4.4 4.1 4.0  CL 98 97* 99  CO2 25 25 21*  GLUCOSE 97 92 118*  BUN 30* 27* 23  CREATININE 0.96 0.91 0.87  CALCIUM 8.6* 8.3* 8.3*   GFR: Estimated Creatinine Clearance: 82.7 mL/min (by C-G formula based on SCr of 0.87 mg/dL).  Liver Function Tests: Recent Labs  Lab 06/12/19 0518 06/13/19 0335 06/14/19 0140 06/15/19 0145  AST 110* 110* 97* 76*  ALT 464* 427* 368* 323*  ALKPHOS 59 60 59 58  BILITOT 0.8 1.1 0.8 0.6  PROT 5.5* 5.6* 5.2* 5.3*  ALBUMIN 3.1* 3.3* 3.1* 3.2*    HbA1C: Hgb A1c MFr Bld  Date/Time Value Ref Range Status  02/07/2019 09:54 AM 5.4 4.6 - 6.5 % Final    Comment:    Glycemic Control Guidelines for People with Diabetes:Non Diabetic:  <6%Goal of Therapy: <7%Additional Action Suggested:  >8%   12/28/2017 08:59 AM 5.9 4.6 - 6.5 % Final    Comment:    Glycemic Control Guidelines for People with Diabetes:Non Diabetic:  <6%Goal of Therapy: <7%Additional Action Suggested:  >8%     Scheduled Meds: . Chlorhexidine Gluconate Cloth  6 each Topical Daily  . clopidogrel  75 mg Oral Daily  . enoxaparin (LOVENOX) injection  40 mg Subcutaneous Q12H  . gabapentin  100 mg Oral QHS  . influenza vaccine adjuvanted  0.5 mL Intramuscular Tomorrow-1000  . mesalamine  1.2 g Oral QHS  . pantoprazole  40 mg Oral Daily   . pneumococcal 23 valent vaccine  0.5 mL Intramuscular Tomorrow-1000  . sodium zirconium cyclosilicate  10 g Oral Daily  . cyanocobalamin  1,000 mcg Oral Daily     LOS: 8 days   Cherene Altes, MD [facility] Office  [phone] Pager - Text Page per Amion  If 7PM-7AM, please contact night-coverage per Amion 06/15/2019, 10:06 AM

## 2019-06-15 NOTE — Progress Notes (Signed)
Patient expressed need to a member of staff to update his daughter on his status for the day. This nutse called the daughter [name] [name] and gave an update on pt condition for today. [name] indicated need to talk to the MD on the way forward as rehab had been mentioned to her and would like to know when that would happen. Will leave sticky note for MD.

## 2019-06-16 LAB — CBC
HCT: 45.3 % (ref 39.0–52.0)
Hemoglobin: 15.3 g/dL (ref 13.0–17.0)
MCH: 27.9 pg (ref 26.0–34.0)
MCHC: 33.8 g/dL (ref 30.0–36.0)
MCV: 82.7 fL (ref 80.0–100.0)
Platelets: 174 10*3/uL (ref 150–400)
RBC: 5.48 MIL/uL (ref 4.22–5.81)
RDW: 16.3 % — ABNORMAL HIGH (ref 11.5–15.5)
WBC: 10.5 10*3/uL (ref 4.0–10.5)
nRBC: 0 % (ref 0.0–0.2)

## 2019-06-16 LAB — COMPREHENSIVE METABOLIC PANEL
ALT: 298 U/L — ABNORMAL HIGH (ref 0–44)
AST: 71 U/L — ABNORMAL HIGH (ref 15–41)
Albumin: 3.4 g/dL — ABNORMAL LOW (ref 3.5–5.0)
Alkaline Phosphatase: 68 U/L (ref 38–126)
Anion gap: 11 (ref 5–15)
BUN: 23 mg/dL (ref 8–23)
CO2: 23 mmol/L (ref 22–32)
Calcium: 8.6 mg/dL — ABNORMAL LOW (ref 8.9–10.3)
Chloride: 99 mmol/L (ref 98–111)
Creatinine, Ser: 0.79 mg/dL (ref 0.61–1.24)
GFR calc Af Amer: >60 mL/min (ref >60)
GFR calc non Af Amer: >60 mL/min (ref >60)
Glucose, Bld: 126 mg/dL — ABNORMAL HIGH (ref 70–99)
Potassium: 3.8 mmol/L (ref 3.5–5.1)
Sodium: 133 mmol/L — ABNORMAL LOW (ref 135–145)
Total Bilirubin: 0.9 mg/dL (ref 0.3–1.2)
Total Protein: 5.6 g/dL — ABNORMAL LOW (ref 6.5–8.1)

## 2019-06-16 MED ORDER — FLUTICASONE PROPIONATE 50 MCG/ACT NA SUSP
1.0000 | Freq: Every day | NASAL | Status: DC | PRN
  Administered 2019-06-16 – 2019-06-17 (×2): 2 via NASAL
  Filled 2019-06-16: qty 16

## 2019-06-16 MED ORDER — ENOXAPARIN SODIUM 40 MG/0.4ML ~~LOC~~ SOLN
40.0000 mg | SUBCUTANEOUS | Status: DC
  Administered 2019-06-17 – 2019-06-19 (×3): 40 mg via SUBCUTANEOUS
  Filled 2019-06-16 (×3): qty 0.4

## 2019-06-16 MED ORDER — GABAPENTIN 100 MG PO CAPS
100.0000 mg | ORAL_CAPSULE | Freq: Two times a day (BID) | ORAL | Status: DC
  Administered 2019-06-16 – 2019-06-22 (×13): 100 mg via ORAL
  Filled 2019-06-16 (×15): qty 1

## 2019-06-16 MED ORDER — PSEUDOEPHEDRINE HCL 30 MG PO TABS
30.0000 mg | ORAL_TABLET | Freq: Four times a day (QID) | ORAL | Status: DC | PRN
  Administered 2019-06-16 – 2019-06-17 (×4): 60 mg via ORAL
  Filled 2019-06-16 (×5): qty 2

## 2019-06-16 NOTE — Progress Notes (Signed)
Pt was on 6L [place] during beginning of shift. I  was able to wean him off to Room Air. He worked with PT  on 2 L [place] that was prior to being RA. Will continue to monitor.

## 2019-06-16 NOTE — Progress Notes (Signed)
[place] TEAM 1 - Stepdown/ICU TEAM  [name] [name]  NLG:[id number] DOB: 07-03-1941 DOA: 05/21/2019 PCP: Biagio Borg, MD    Brief Narrative:  78 year old with a history of peripheral vascular disease, COPD, HTN, HLD, ulcerative colitis, and GERD with Barrett's esophagus who was admitted for COVID pneumonia 9/19 > 9/28.  He was discharged after receiving steroids, Actemra, and Remdesivir and was requiring 4 L supplemental O2 at the time.  He reported becoming much more short of breath and severely hypoxic after returning home.  He came back to the ED 9/29 at which time he required 10 L of high flow nasal cannula.  A CXR upon his return noted unchanged bilateral pulmonary infiltrates.  He was noted to have newly markedly elevated d-dimer.  Significant Events: 9/19 > 9/28 prior admit to [facility] 9/29 readmit to [facility] 10/1 spontaneous pneumothorax - chest tube placed 10/6 chest tube removed  COVID-19 specific Treatment: Actemra 9/25 Remdesivir 9/19 > 9/23 Decadron  Subjective: Remains quite frustrated and discouraged with his slow improvement.  We had a lengthy discussion about the prolonged recovery required for many patients.  He denies chest pain nausea or vomiting.  He does have some left posterior flank and back pain which is superficial with no overlying rash.  He denies nausea vomiting or abdominal pain.  Assessment & Plan:  COVID pneumonia - acute hypoxic respiratory failure Very slow to improve -follow-up chest x-ray 10/7 without evidence of recurrent pneumothorax and with essentially stable parenchymal infiltrates - has completed courses of Actemra, Remdesivir, and Decadron -is stable at rest but desaturates markedly with attempt to exert himself -continue to diurese as able  Spontaneous right pneumothorax 14 French pigtail chest tube placed 10/1 -chest tube removed 10/6 -no recurrence of pneumothorax on follow-up CXR  Elevated d-dimer CTa not able to be obtained  due to contrast allergy and claustrophobia - bilateral lower extremity venous duplex negative -with clinical stability will decrease back to standard prophylactic dose Lovenox  Transaminitis Ultrasound noted echogenic liver without other acute findings -viral hepatitis panel pending  Recent Labs  Lab 06/12/19 0518 06/13/19 0335 06/14/19 0140 06/15/19 0145 06/16/19 0345  AST 110* 110* 97* 76* 71*  ALT 464* 427* 368* 323* 298*  ALKPHOS 59 60 59 58 68  BILITOT 0.8 1.1 0.8 0.6 0.9  PROT 5.5* 5.6* 5.2* 5.3* 5.6*  ALBUMIN 3.1* 3.3* 3.1* 3.2* 3.4*    COPD Well compensated presently  Peripheral vascular disease No acute complications presently  Acute kidney injury Resolved with creatinine presently normal  HTN Blood pressure is controlled  Hyponatremia Improving with diuresis  Ulcerative colitis Appears well compensated presently  GERD Continue home medical therapy  Morbid obesity - Estimated body mass index is 34.47 kg/m as calculated from the following:   Height as of this encounter: 5' 7.99" (1.727 m).   Weight as of this encounter: 102.8 kg.   DVT prophylaxis: Lovenox  Code Status: FULL CODE Family Communication:  Disposition Plan:   Consultants:  none  Antimicrobials:  None  Objective: Blood pressure 121/61, pulse 78, temperature (!) 97.4 F (36.3 C), temperature source Oral, resp. rate 19, height 5' 7.99" (1.727 m), weight 102.8 kg, SpO2 96 %.  Intake/Output Summary (Last 24 hours) at 06/16/2019 0937 Last data filed at 06/16/2019 0600 Gross per 24 hour  Intake -  Output 2480 ml  Net -2480 ml   Filed Weights   06/10/19 1223  Weight: 102.8 kg    Examination: General: No acute respiratory  distress at rest Lungs: Diffuse bilateral crackles without change with no wheezing Cardiovascular: RRR without murmur or rub Abdomen: NT/ND, soft, BS positive Extremities: No significant cyanosis, clubbing, or edema B LE   CBC: Recent Labs  Lab 06/10/19  0000 06/11/19 0404 06/12/19 0518 06/16/19 0345  WBC 9.0 6.5 6.9 10.5  NEUTROABS 7.6 5.2 5.4  --   HGB 15.3 15.5 15.2 15.3  HCT 45.8 46.6 45.4 45.3  MCV 83.1 83.4 83.5 82.7  PLT 300 221 216 767   Basic Metabolic Panel: Recent Labs  Lab 06/14/19 0140 06/15/19 0145 06/16/19 0345  NA 131* 130* 133*  K 4.1 4.0 3.8  CL 97* 99 99  CO2 25 21* 23  GLUCOSE 92 118* 126*  BUN 27* 23 23  CREATININE 0.91 0.87 0.79  CALCIUM 8.3* 8.3* 8.6*   GFR: Estimated Creatinine Clearance: 89.9 mL/min (by C-G formula based on SCr of 0.79 mg/dL).  Liver Function Tests: Recent Labs  Lab 06/13/19 0335 06/14/19 0140 06/15/19 0145 06/16/19 0345  AST 110* 97* 76* 71*  ALT 427* 368* 323* 298*  ALKPHOS 60 59 58 68  BILITOT 1.1 0.8 0.6 0.9  PROT 5.6* 5.2* 5.3* 5.6*  ALBUMIN 3.3* 3.1* 3.2* 3.4*    HbA1C: Hgb A1c MFr Bld  Date/Time Value Ref Range Status  02/07/2019 09:54 AM 5.4 4.6 - 6.5 % Final    Comment:    Glycemic Control Guidelines for People with Diabetes:Non Diabetic:  <6%Goal of Therapy: <7%Additional Action Suggested:  >8%   12/28/2017 08:59 AM 5.9 4.6 - 6.5 % Final    Comment:    Glycemic Control Guidelines for People with Diabetes:Non Diabetic:  <6%Goal of Therapy: <7%Additional Action Suggested:  >8%     Scheduled Meds: . bethanechol  10 mg Oral TID  . Chlorhexidine Gluconate Cloth  6 each Topical Daily  . clopidogrel  75 mg Oral Daily  . enoxaparin (LOVENOX) injection  40 mg Subcutaneous Q12H  . furosemide  40 mg Intravenous Q12H  . gabapentin  100 mg Oral QHS  . influenza vaccine adjuvanted  0.5 mL Intramuscular Tomorrow-1000  . mesalamine  1.2 g Oral QHS  . pantoprazole  40 mg Oral Daily  . pneumococcal 23 valent vaccine  0.5 mL Intramuscular Tomorrow-1000  . cyanocobalamin  1,000 mcg Oral Daily     LOS: 9 days   Cherene Altes, MD [facility] Office  [phone] Pager - Text Page per Amion  If 7PM-7AM, please contact night-coverage per Amion  06/16/2019, 9:37 AM

## 2019-06-16 NOTE — Progress Notes (Signed)
Physical Therapy Treatment Patient Details Name: [name] [name] MRN: [id number] DOB: 02/28/1941 Today's Date: 06/16/2019    History of Present Illness Pt is a 78 y.o. male recently discharged hom from [facility] on 9/28, was only home a few hours then readmitted 05/26/2019 with hypoxia. PMH includes PVC, HTN, HLD, Barrett's esophagus, ulcerative colitis.    PT Comments    Tolerated tx well this pm, was able to ambulate in room on 2L/min via [place] monitored on earlobe probe. desat to 84% after ambulation but able to sit and complete pursed lip breathing to recover 02 saturation to high 80s.    Follow Up Recommendations  Home health PT;Supervision for mobility/OOB     Equipment Recommendations       Recommendations for Other Services       Precautions / Restrictions Precautions Precautions: Fall Restrictions Weight Bearing Restrictions: No    Mobility  Bed Mobility               General bed mobility comments: pt recieved in recliner, states that when he got up this am, he felt as if he was going to fall, unable to give exact feeling if it was dizziness or legs give out etc but states 2 staff members caught him and assisted him to recliner  Transfers Overall transfer level: Needs assistance Equipment used: Rolling walker (2 wheeled) Transfers: Sit to/from Stand Sit to Stand: Min guard         General transfer comment: sec to incident this am, used RW for safety  Ambulation/Gait Ambulation/Gait assistance: Min guard Gait Distance (Feet): 44 Feet(88f x 2 w/ seated rest break) Assistive device: Rolling walker (2 wheeled) Gait Pattern/deviations: Step-through pattern     General Gait Details: ambulates slow and cautious, noted still having difficulty with coordination of ambulation and pursed lip breathing. pt was on 1L/min via [place] and monitored via earlobe probe, pt asked for increase to 2L/min with mobility. was able to ambulate distance but desat to 84% afterwards, took  3-421ms to recover to high 80s again but did not seem to be in distress as previous   [facility] [place] (Stroke Patients Only)       Balance Overall balance assessment: Needs assistance Sitting-balance support: Feet supported Sitting balance-Leahy Scale: Fair     Standing balance support: Bilateral upper extremity supported Standing balance-Leahy Scale: Fair Standing balance comment: uses RW today                            Cognition Arousal/Alertness: Awake/alert Behavior During Therapy: WFL for tasks assessed/performed;Anxious Overall Cognitive Status: Within Functional Limits for tasks assessed                                 General Comments: pt in much better mood and is pleased with progress made in session      Exercises Other Exercises Other Exercises: reinforced use of IS and flutter valve, pt will need more reinforcement w/ these as he had some difficulty grasping concepts    General Comments General comments (skin integrity, edema, etc.): Pt seems to be pleased with progress this pm, stated he had incident this am where he almost fell. this pm no significant balance deficits noted, pt able to ge up from recliner ambulate in room approx 4442f  with RW and CGA he remains on 2L/min via [place] does desat to 84% but is able to sit and complete pursed lip breathing calmly to bring 02 sat back up.      Pertinent Vitals/Pain Pain Assessment: 0-10 Pain Score: 4  Pain Location: R flank  Pain Descriptors / Indicators: Discomfort Pain Intervention(s): Limited activity within patient's tolerance;Monitored during session    Home Living                      Prior Function            PT Goals (current goals can now be found in the care plan section) Acute Rehab PT Goals Patient Stated Goal: wants to get home and stay home PT Goal Formulation: With patient Time For Goal Achievement:  July 03, 2019 Potential to Achieve Goals: Good Progress towards PT goals: Progressing toward goals    Frequency    Min 3X/week      PT Plan Current plan remains appropriate    Co-evaluation              AM-PAC PT "6 Clicks" Mobility   Outcome Measure  Help needed turning from your back to your side while in a flat bed without using bedrails?: A Little Help needed moving from lying on your back to sitting on the side of a flat bed without using bedrails?: A Little Help needed moving to and from a bed to a chair (including a wheelchair)?: A Little Help needed standing up from a chair using your arms (e.g., wheelchair or bedside chair)?: A Little Help needed to walk in hospital room?: A Little Help needed climbing 3-5 steps with a railing? : A Lot 6 Click Score: 17    End of Session Equipment Utilized During Treatment: Oxygen Activity Tolerance: Treatment limited secondary to medical complications (Comment) Patient left: in chair;with call bell/phone within reach Nurse Communication: Mobility status PT Visit Diagnosis: Other abnormalities of gait and mobility (R26.89);Muscle weakness (generalized) (M62.81)     Time: 5945-8592 PT Time Calculation (min) (ACUTE ONLY): 32 min  Charges:  $Gait Training: 8-22 mins $Self Care/Home Management: [place], PT    Delford Field 06/16/2019, 5:45 PM

## 2019-06-16 NOTE — Progress Notes (Signed)
Pt's daughter was called and updated about pt's condition. daugher is upset about not receiving a phone call from a doctor for two days. DR has been called and told to contact family.

## 2019-06-17 LAB — BASIC METABOLIC PANEL
Anion gap: 15 (ref 5–15)
BUN: 35 mg/dL — ABNORMAL HIGH (ref 8–23)
CO2: 22 mmol/L (ref 22–32)
Calcium: 8.7 mg/dL — ABNORMAL LOW (ref 8.9–10.3)
Chloride: 95 mmol/L — ABNORMAL LOW (ref 98–111)
Creatinine, Ser: 1.05 mg/dL (ref 0.61–1.24)
GFR calc Af Amer: >60 mL/min (ref >60)
GFR calc non Af Amer: >60 mL/min (ref >60)
Glucose, Bld: 108 mg/dL — ABNORMAL HIGH (ref 70–99)
Potassium: 3.7 mmol/L (ref 3.5–5.1)
Sodium: 132 mmol/L — ABNORMAL LOW (ref 135–145)

## 2019-06-17 LAB — CBC
HCT: 43.9 % (ref 39.0–52.0)
Hemoglobin: 14.8 g/dL (ref 13.0–17.0)
MCH: 27.7 pg (ref 26.0–34.0)
MCHC: 33.7 g/dL (ref 30.0–36.0)
MCV: 82.1 fL (ref 80.0–100.0)
Platelets: 176 10*3/uL (ref 150–400)
RBC: 5.35 MIL/uL (ref 4.22–5.81)
RDW: 16.1 % — ABNORMAL HIGH (ref 11.5–15.5)
WBC: 10.8 10*3/uL — ABNORMAL HIGH (ref 4.0–10.5)
nRBC: 0 % (ref 0.0–0.2)

## 2019-06-17 MED ORDER — FUROSEMIDE 10 MG/ML IJ SOLN
40.0000 mg | Freq: Two times a day (BID) | INTRAMUSCULAR | Status: AC
  Administered 2019-06-17 – 2019-06-18 (×3): 40 mg via INTRAVENOUS
  Filled 2019-06-17 (×3): qty 4

## 2019-06-17 MED ORDER — ALPRAZOLAM 0.5 MG PO TABS: 0.2500 mg | ORAL_TABLET | Freq: Three times a day (TID) | ORAL | Status: DC | PRN

## 2019-06-17 NOTE — Progress Notes (Signed)
Pt's daughter has been called for update but no answer. I left her a voicemail sating that I was calling for un update and she could call us back.

## 2019-06-17 NOTE — Progress Notes (Signed)
Patient's daughter, Arrie Aran, updated via phone on patient's progress and plan of care. All questions answered at present time.

## 2019-06-17 NOTE — Progress Notes (Signed)
[facility] TEAM 1 - Stepdown/ICU TEAM  HAYES CZAJA  [id number]:[id number] DOB: 06/21/1941 DOA: 05/30/2019 PCP: Biagio Borg, MD    Brief Narrative:  78 year old with a history of peripheral vascular disease, COPD, HTN, HLD, ulcerative colitis, and GERD with Barrett's esophagus who was admitted for COVID pneumonia 9/19 > 9/28.  He was discharged after receiving steroids, Actemra, and Remdesivir and was requiring 4 L supplemental O2 at the time.  He reported becoming much more short of breath and severely hypoxic after returning home.  He came back to the ED 9/29 at which time he required 10 L of high flow nasal cannula.  A CXR upon his return noted unchanged bilateral pulmonary infiltrates.  He was noted to have newly markedly elevated d-dimer.  Significant Events: 9/19 > 9/28 prior admit to [facility] 9/29 readmit to [facility] 10/1 spontaneous pneumothorax - chest tube placed 10/6 chest tube removed  COVID-19 specific Treatment: Actemra 9/25 Remdesivir 9/19 > 9/23 Decadron  Subjective: The patient looks more comfortable sitting up in a bedside chair today.  He remains somewhat discouraged but is participating with occupational therapy and appears to be motivated to do so.  He denies any new complaints but is noted to desaturate very rapidly with the slightest exertion, as previously noted.  I have spoken with his occupational therapist at length and feels that he may indeed be a good candidate for CIR if he is able to make more progress over the next 2 to 3 days.  Assessment & Plan:  COVID pneumonia - acute hypoxic respiratory failure Very slow to improve - has completed courses of Actemra, Remdesivir, and Decadron - is stable at rest but desaturates markedly with attempt to exert himself -continue to diurese as renal function will allow  Spontaneous right pneumothorax 14 French pigtail chest tube placed 10/1 -chest tube removed 10/6 -no reaccumulation on follow-up chest x-ray since  chest tube removed  Elevated d-dimer CTA not able to be obtained due to contrast allergy and claustrophobia - bilateral lower extremity venous duplex negative - Lovenox transition back to prophylactic dose as clinical suspicion of PE at this time is low  Transaminitis Ultrasound noted echogenic liver without other acute findings -viral hepatitis panel negative  Recent Labs  Lab 06/12/19 0518 06/13/19 0335 06/14/19 0140 06/15/19 0145 06/16/19 0345  AST 110* 110* 97* 76* 71*  ALT 464* 427* 368* 323* 298*  ALKPHOS 59 60 59 58 68  BILITOT 0.8 1.1 0.8 0.6 0.9  PROT 5.5* 5.6* 5.2* 5.3* 5.6*  ALBUMIN 3.1* 3.3* 3.1* 3.2* 3.4*    COPD Well compensated presently  Peripheral vascular disease No acute complications presently  Acute kidney injury Resolved with creatinine presently normal  HTN Blood pressure presently controlled  Hyponatremia Follow with ongoing diuresis  Ulcerative colitis Appears well compensated presently  GERD Continue home medical therapy  Morbid obesity - Estimated body mass index is 34.47 kg/m as calculated from the following:   Height as of this encounter: 5' 7.99" (1.727 m).   Weight as of this encounter: 102.8 kg.   DVT prophylaxis: Lovenox intermediate dose Code Status: FULL CODE Family Communication: Spoke with patient's daughter at great length 10/8 -follow-up call to be made later today Disposition Plan: Patient will need rehab stay either in CIR or SNF depending upon his progress over the next 2 to 3 days -will need repeat testing for SARS-CoV-2 to facilitate this placement  Consultants:  none  Antimicrobials:  None  Objective: Blood pressure 105/61,  pulse 81, temperature 97.7 F (36.5 C), temperature source Oral, resp. rate 20, height 5' 7.99" (1.727 m), weight 102.8 kg, SpO2 (!) 86 %.  Intake/Output Summary (Last 24 hours) at 06/17/2019 0956 Last data filed at 06/17/2019 0541 Gross per 24 hour  Intake 350 ml  Output 1050 ml  Net  -700 ml   Filed Weights   06/10/19 1223  Weight: 102.8 kg    Examination: General: No acute respiratory distress at rest Lungs: Fine crackles diffusely bilateral fields without change Cardiovascular: RRR Abdomen: NT/ND, soft, BS positive, no rebound Extremities: Trace bilateral lower extremity edema  CBC: Recent Labs  Lab 06/11/19 0404 06/12/19 0518 06/16/19 0345 06/17/19 0310  WBC 6.5 6.9 10.5 10.8*  NEUTROABS 5.2 5.4  --   --   HGB 15.5 15.2 15.3 14.8  HCT 46.6 45.4 45.3 43.9  MCV 83.4 83.5 82.7 82.1  PLT 221 216 174 924   Basic Metabolic Panel: Recent Labs  Lab 06/15/19 0145 06/16/19 0345 06/17/19 0310  NA 130* 133* 132*  K 4.0 3.8 3.7  CL 99 99 95*  CO2 21* 23 22  GLUCOSE 118* 126* 108*  BUN 23 23 35*  CREATININE 0.87 0.79 1.05  CALCIUM 8.3* 8.6* 8.7*   GFR: Estimated Creatinine Clearance: 68.5 mL/min (by C-G formula based on SCr of 1.05 mg/dL).  Liver Function Tests: Recent Labs  Lab 06/13/19 0335 06/14/19 0140 06/15/19 0145 06/16/19 0345  AST 110* 97* 76* 71*  ALT 427* 368* 323* 298*  ALKPHOS 60 59 58 68  BILITOT 1.1 0.8 0.6 0.9  PROT 5.6* 5.2* 5.3* 5.6*  ALBUMIN 3.3* 3.1* 3.2* 3.4*    HbA1C: Hgb A1c MFr Bld  Date/Time Value Ref Range Status  02/07/2019 09:54 AM 5.4 4.6 - 6.5 % Final    Comment:    Glycemic Control Guidelines for People with Diabetes:Non Diabetic:  <6%Goal of Therapy: <7%Additional Action Suggested:  >8%   12/28/2017 08:59 AM 5.9 4.6 - 6.5 % Final    Comment:    Glycemic Control Guidelines for People with Diabetes:Non Diabetic:  <6%Goal of Therapy: <7%Additional Action Suggested:  >8%     Scheduled Meds: . bethanechol  10 mg Oral TID  . Chlorhexidine Gluconate Cloth  6 each Topical Daily  . clopidogrel  75 mg Oral Daily  . enoxaparin (LOVENOX) injection  40 mg Subcutaneous Q24H  . gabapentin  100 mg Oral BID  . influenza vaccine adjuvanted  0.5 mL Intramuscular Tomorrow-1000  . mesalamine  1.2 g Oral QHS  .  pantoprazole  40 mg Oral Daily  . pneumococcal 23 valent vaccine  0.5 mL Intramuscular Tomorrow-1000  . cyanocobalamin  1,000 mcg Oral Daily     LOS: 10 days   Cherene Altes, MD [facility] Office  [phone] Pager - Text Page per Amion  If 7PM-7AM, please contact night-coverage per Amion 06/17/2019, 9:56 AM

## 2019-06-17 NOTE — Progress Notes (Signed)
Occupational Therapy Treatment Patient Details Name: [name] [name] MRN: [id number] DOB: 1940/09/10 Today's Date: 06/17/2019    History of present illness Pt is a 78 y.o. male recently discharged hom from [facility] on 9/28, was only home a few hours then readmitted 05/23/2019 with hypoxia. PMH includes PVC, HTN, HLD, Barrett's esophagus, ulcerative colitis.   OT comments  Pt motivated about working with therapy. Ambulated around 35 ft on 4L with desat to low 80s. Pt with shallow breathing and requires VC for correct pursed lip breathing. After 10 min seated rest break, pt ambulated to sink to assist with LB bathing. With increased ADL activity, Pt desats into low 70s with 3/4 DOE. After 5 min and SpO2 low 80s, O2 increased to 6L. Ambulated back to chair and left on 4L with SpO2 @ 88. Given pt's generalized weakness and amount of assistance that is needed with ADL and current fall risk, recommend CIR consult. If pt is not negative, he may need SNF if CIR is not an option. Pt appears anxious due to feeling SOB - he may benefit from anti-anxiety meds to increase activity tolerance. Discussed with Dr. Thereasa Solo. Will continue to follow.    Follow Up Recommendations  CIR;Supervision/Assistance - 24 hour    Equipment Recommendations  None recommended by OT    Recommendations for Other Services Rehab consult    Precautions / Restrictions Precautions Precautions: Fall       Mobility Bed Mobility               General bed mobility comments:  Transfers Overall transfer level: Needs assistance Equipment used: Rolling walker (2 wheeled) Transfers: Sit to/from Stand Sit to Stand: Min A for safely lower to chair. Pt flopping into chair - when I asked why he states " I was just give out"              Balance Overall balance assessment: Needs assistance Sitting-balance support: Feet supported Sitting balance-Leahy Scale: Good       Standing balance-Leahy Scale: Fair Standing balance  comment: unsteady at times; likes using RW                           ADL either performed or assessed with clinical judgement   ADL Overall ADL's : Needs assistance/impaired             Lower Body Bathing: Moderate assistance;Sit to/from stand Lower Body Bathing Details (indicate cue type and reason): pt has difficulty reaching feet and unable to thouroughly clean pericare due to fatigue and difficulty reaching secondary to weakness     Lower Body Dressing: Sit to/from stand;Moderate assistance   Toilet Transfer: Minimal assistance;RW;Ambulation [employer] forces technical officer Details (indicate cue type and reason): using condom cath; chair soiled ?leakage         Functional mobility during ADLs: Minimal assistance;Rolling walker;Cueing for safety(pt "flopping" into chair)       Vision       Perception     Praxis      Cognition Arousal/Alertness: Awake/alert Behavior During Therapy: Anxious(at times) Overall Cognitive Status: Within Functional Limits for tasks assessed(slow processing)                                          Exercises Exercises: Other exercises Other Exercises Other Exercises: flutter valve x 10 Other Exercises: Pt states he is  doing his theraband ex on his own   Shoulder Instructions       General Comments Pt asking to use RW    Pertinent Vitals/ Pain       Pain Assessment: Faces Faces Pain Scale: Hurts little more Pain Location: (chest with breathing) Pain Descriptors / Indicators: Discomfort Pain Intervention(s): Limited activity within patient's tolerance  Home Living                                          Prior Functioning/Environment              Frequency  Min 3X/week        Progress Toward Goals  OT Goals(current goals can now be found in the care plan section)  Progress towards OT goals: Progressing toward goals  Acute Rehab OT Goals Patient Stated Goal: wants to get home and  stay home OT Goal Formulation: With patient Time For Goal Achievement: 06/28/19 Potential to Achieve Goals: Good ADL Goals Pt Will Perform Grooming: with supervision;standing;sitting Pt Will Perform Lower Body Bathing: with supervision;sit to/from stand Pt Will Perform Lower Body Dressing: with supervision;sit to/from stand Pt Will Transfer to Toilet: squat pivot transfer;ambulating Pt Will Perform Toileting - Clothing Manipulation and hygiene: with supervision;sit to/from stand Pt/caregiver will Perform Home Exercise Program: Increased strength;Both right and left upper extremity;With written HEP provided;Independently;With theraband Additional ADL Goal #1: Pt will completed functional activity with SpO2 > 90% while using pursed lip breathing and energy conservation techniques.  Plan Discharge plan needs to be updated;Frequency remains appropriate    Co-evaluation                 AM-PAC OT "6 Clicks" Daily Activity     Outcome Measure   Help from another person eating meals?: None Help from another person taking care of personal grooming?: A Little Help from another person toileting, which includes using toliet, bedpan, or urinal?: A Lot Help from another person bathing (including washing, rinsing, drying)?: A Lot Help from another person to put on and taking off regular upper body clothing?: A Little Help from another person to put on and taking off regular lower body clothing?: A Lot 6 Click Score: 16    End of Session Equipment Utilized During Treatment: Oxygen(4L)  OT Visit Diagnosis: Unsteadiness on feet (R26.81);Muscle weakness (generalized) (M62.81)   Activity Tolerance Patient tolerated treatment well   Patient Left in chair;with call bell/phone within reach   Nurse Communication Mobility status;Other (comment)(O2 sats)        Time: 2395-3202 OT Time Calculation (min): 40 min  Charges: OT General Charges $OT Visit: 1 Visit OT Treatments $Self Care/Home  Management : 38-52 mins  Maurie Boettcher, OT/L   Acute OT Clinical Specialist [facility] Pager [phone] Office [phone]    [facility] 06/17/2019, 3:08 PM

## 2019-06-17 NOTE — Progress Notes (Signed)
Daughter called back for update. She was updated and she is appreciative of the care her dad is getting.

## 2019-06-18 ENCOUNTER — Inpatient Hospital Stay (HOSPITAL_COMMUNITY): Payer: Medicare Other

## 2019-06-18 DIAGNOSIS — J939 Pneumothorax, unspecified: Secondary | ICD-10-CM

## 2019-06-18 DIAGNOSIS — U071 COVID-19: Secondary | ICD-10-CM | POA: Diagnosis not present

## 2019-06-18 DIAGNOSIS — J069 Acute upper respiratory infection, unspecified: Secondary | ICD-10-CM | POA: Diagnosis not present

## 2019-06-18 DIAGNOSIS — J9601 Acute respiratory failure with hypoxia: Secondary | ICD-10-CM | POA: Diagnosis not present

## 2019-06-18 LAB — MRSA PCR SCREENING: MRSA by PCR: NEGATIVE

## 2019-06-18 MED ORDER — ORAL CARE MOUTH RINSE
15.0000 mL | Freq: Two times a day (BID) | OROMUCOSAL | Status: DC
  Administered 2019-06-18 – 2019-06-22 (×7): 15 mL via OROMUCOSAL

## 2019-06-18 MED ORDER — MORPHINE SULFATE (PF) 2 MG/ML IV SOLN
2.0000 mg | Freq: Once | INTRAVENOUS | Status: AC
  Administered 2019-06-18: 17:00:00 2 mg via INTRAVENOUS

## 2019-06-18 MED ORDER — MORPHINE SULFATE (PF) 2 MG/ML IV SOLN
2.0000 mg | INTRAVENOUS | Status: DC | PRN
  Administered 2019-06-18 (×2): 2 mg via INTRAVENOUS
  Administered 2019-06-19 (×5): 4 mg via INTRAVENOUS
  Administered 2019-06-19 – 2019-06-20 (×3): 2 mg via INTRAVENOUS
  Administered 2019-06-20: 4 mg via INTRAVENOUS
  Administered 2019-06-21: 16:00:00 2 mg via INTRAVENOUS
  Administered 2019-06-21 – 2019-06-22 (×2): 4 mg via INTRAVENOUS
  Administered 2019-06-22: 13:00:00 2 mg via INTRAVENOUS
  Administered 2019-06-22: 01:00:00 4 mg via INTRAVENOUS
  Filled 2019-06-18: qty 2
  Filled 2019-06-18 (×3): qty 1
  Filled 2019-06-18: qty 2
  Filled 2019-06-18: qty 1
  Filled 2019-06-18 (×2): qty 2
  Filled 2019-06-18: qty 1
  Filled 2019-06-18: qty 2
  Filled 2019-06-18: qty 1
  Filled 2019-06-18: qty 2
  Filled 2019-06-18: qty 1
  Filled 2019-06-18 (×4): qty 2

## 2019-06-18 MED ORDER — STERILE WATER FOR INJECTION IJ SOLN
INTRAMUSCULAR | Status: AC
  Administered 2019-06-18: 17:00:00
  Filled 2019-06-18: qty 20

## 2019-06-18 MED ORDER — ALPRAZOLAM 0.5 MG PO TABS
0.2500 mg | ORAL_TABLET | Freq: Three times a day (TID) | ORAL | Status: DC | PRN
  Administered 2019-06-22: 09:00:00 0.5 mg via ORAL
  Filled 2019-06-18: qty 1

## 2019-06-18 MED ORDER — TRAMADOL HCL 50 MG PO TABS
50.0000 mg | ORAL_TABLET | Freq: Four times a day (QID) | ORAL | Status: DC | PRN
  Administered 2019-06-18 – 2019-06-21 (×4): 50 mg via ORAL
  Filled 2019-06-18 (×4): qty 1

## 2019-06-18 MED ORDER — MORPHINE SULFATE (PF) 2 MG/ML IV SOLN
2.0000 mg | INTRAVENOUS | Status: DC | PRN
  Filled 2019-06-18: qty 1

## 2019-06-18 MED ORDER — PNEUMOCOCCAL VAC POLYVALENT 25 MCG/0.5ML IJ INJ
0.5000 mL | INJECTION | INTRAMUSCULAR | Status: DC | PRN
  Filled 2019-06-18: qty 0.5

## 2019-06-18 MED ORDER — INFLUENZA VAC A&B SA ADJ QUAD 0.5 ML IM PRSY
0.5000 mL | PREFILLED_SYRINGE | INTRAMUSCULAR | Status: DC | PRN
  Filled 2019-06-18: qty 0.5

## 2019-06-18 NOTE — Progress Notes (Signed)
PT Cancellation Note  Patient Details Name: [name] [name] MRN: [id number] DOB: 1941-06-14   Cancelled Treatment:     Arrived in pt room and pt was sitting comfortably in recliner, sats in 90s on 3L/min via [place]. Pt reported that he was having pain in his left side chest. Pt stated he was willing to participate in tx, but from previous hx of pt c/o pain in chest therapist decided to hold on tx at this time. Nurse was notified and reported that imaging had been ordered already.    Delford Field 06/18/2019, 4:53 PM

## 2019-06-18 NOTE — Procedures (Signed)
Chest Tube Insertion Procedure Note  Indications:  Clinically significant Pneumothorax  Pre-operative Diagnosis: Pneumothorax  Post-operative Diagnosis: Pneumothorax  Procedure Details  Informed consent was obtained for the procedure, including sedation.  Risks of lung perforation, hemorrhage, arrhythmia, and adverse drug reaction were discussed.   After sterile skin prep, using standard technique, a 14 French tube was placed in the left lateral 10th rib space using the modified seldinger technique with a wire/dilator.  Findings: Rush of air  Estimated Blood Loss:  Minimal         Specimens:  None              Complications:  None; patient tolerated the procedure well.         Disposition: ICU - extubated and stable.         Condition: stable  Attending Attestation: I performed the procedure.  Roselie Awkward, MD [facility] Pager: [phone] Cell: [phone] If no response, call [phone]

## 2019-06-18 NOTE — Progress Notes (Addendum)
[facility] TEAM 1 - Stepdown/ICU TEAM  [name] [name]  [id number] DOB: [birth year] DOA: 06/07/2019 PCP: Biagio Borg, MD    Brief Narrative:  78 year old with a history of peripheral vascular disease, COPD, HTN, HLD, ulcerative colitis, and GERD with Barrett's esophagus who was admitted for COVID pneumonia 9/19 > 9/28.  He was discharged after receiving steroids, Actemra, and Remdesivir and was requiring 4 L supplemental O2 at the time.  He reported becoming much more short of breath and severely hypoxic after returning home.  He came back to the ED 9/29 at which time he required 10 L of high flow nasal cannula.  A CXR upon his return noted unchanged bilateral pulmonary infiltrates.  He was noted to have newly markedly elevated d-dimer.  Significant Events: 9/19 > 9/28 prior admit to [facility] 9/29 readmit to [facility] 10/1 spontaneous pneumothorax - chest tube placed 10/6 chest tube removed  COVID-19 specific Treatment: Actemra 9/25 Remdesivir 9/19 > 9/23 Decadron  Subjective: Patient continues to desaturate with exertion.  He had a particularly prolonged episode of desaturation today.  He complains of the new onset of left-sided pleuritic type chest pain which appears to have begun this morning.  He denies nausea vomiting or abdominal pain.  He remains quite discouraged by his slow recovery.  Addendum: Later in the morning following my rounds I was contacted by the patient's RN reporting that the patient's left-sided pleuritic chest pain was persisting and worsening.  The patient was now describing it as similar to the sensation he had when he had his right-sided pneumothorax.  A stat portable chest x-ray was put in and revealed a new right-sided spontaneous pneumothorax.  The patient was not in extremis.  PCCM was consulted urgently.  Arrangements were made for the patient to be transferred to the ICU urgently.  The patient will have a chest tube placed by critical care as soon as  he arrives in the ICU.  Assessment & Plan:  COVID pneumonia - acute hypoxic respiratory failure Very slow to improve - has completed courses of Actemra, Remdesivir, and Decadron - is stable at rest but desaturates markedly with attempts to exert himself -continue to diurese as renal function will allow - intermittent f/u CXRs  Spontaneous right pneumothorax 14 French pigtail chest tube placed 10/1 -chest tube removed 10/6 -no reaccumulation on follow-up chest x-ray since chest tube removed  Elevated d-dimer CTA not able to be obtained due to contrast allergy and claustrophobia - bilateral lower extremity venous duplex negative - Lovenox transitioned back to prophylactic dose w/ pt improving clinically   Transaminitis Ultrasound noted echogenic liver without other acute findings -viral hepatitis panel negative - recheck LFTs in 2-3 days   Recent Labs  Lab 06/12/19 0518 06/13/19 0335 06/14/19 0140 06/15/19 0145 06/16/19 0345  AST 110* 110* 97* 76* 71*  ALT 464* 427* 368* 323* 298*  ALKPHOS 59 60 59 58 68  BILITOT 0.8 1.1 0.8 0.6 0.9  PROT 5.5* 5.6* 5.2* 5.3* 5.6*  ALBUMIN 3.1* 3.3* 3.1* 3.2* 3.4*    COPD Well compensated  Peripheral vascular disease No acute complications  Acute kidney injury Resolved with creatinine presently normal -check intermittently with attempts to diurese  HTN Blood pressure controlled  Hyponatremia Improving with diuresis  Ulcerative colitis Appears well compensated presently  GERD Continue home medical therapy  Morbid obesity - Estimated body mass index is 34.47 kg/m as calculated from the following:   Height as of this encounter: 5' 7.99" (1.727 m).  Weight as of this encounter: 102.8 kg.   DVT prophylaxis: Lovenox intermediate dose Code Status: FULL CODE Family Communication: Spoke with patient's daughter at great length 10/9 - follow-up call to be made later today Disposition Plan: Patient will need rehab stay either in CIR or  SNF depending upon his progress - repeat testing for SARS-CoV-2 sent off to facilitate this placement  Consultants:  none  Antimicrobials:  None  Objective: Blood pressure (!) 99/51, pulse 95, temperature 98.2 F (36.8 C), temperature source Axillary, resp. rate (!) 22, height 5' 7.99" (1.727 m), weight 102.8 kg, SpO2 (!) 88 %.  Intake/Output Summary (Last 24 hours) at 06/18/2019 0954 Last data filed at 06/18/2019 0800 Gross per 24 hour  Intake 500 ml  Output 1875 ml  Net -1375 ml   Filed Weights   06/10/19 1223  Weight: 102.8 kg    Examination: General: No acute respiratory distress  Lungs: Fine crackles diffusely bilateral fields - good air movement B on exam  Cardiovascular: RRR - no M or rub  Abdomen: NT/ND, soft, BS positive, no rebound Extremities: Trace B LE edema   CBC: Recent Labs  Lab 06/12/19 0518 06/16/19 0345 06/17/19 0310  WBC 6.9 10.5 10.8*  NEUTROABS 5.4  --   --   HGB 15.2 15.3 14.8  HCT 45.4 45.3 43.9  MCV 83.5 82.7 82.1  PLT 216 174 330   Basic Metabolic Panel: Recent Labs  Lab 06/15/19 0145 06/16/19 0345 06/17/19 0310  NA 130* 133* 132*  K 4.0 3.8 3.7  CL 99 99 95*  CO2 21* 23 22  GLUCOSE 118* 126* 108*  BUN 23 23 35*  CREATININE 0.87 0.79 1.05  CALCIUM 8.3* 8.6* 8.7*   GFR: Estimated Creatinine Clearance: 68.5 mL/min (by C-G formula based on SCr of 1.05 mg/dL).  Liver Function Tests: Recent Labs  Lab 06/13/19 0335 06/14/19 0140 06/15/19 0145 06/16/19 0345  AST 110* 97* 76* 71*  ALT 427* 368* 323* 298*  ALKPHOS 60 59 58 68  BILITOT 1.1 0.8 0.6 0.9  PROT 5.6* 5.2* 5.3* 5.6*  ALBUMIN 3.3* 3.1* 3.2* 3.4*    HbA1C: Hgb A1c MFr Bld  Date/Time Value Ref Range Status  02/07/2019 09:54 AM 5.4 4.6 - 6.5 % Final    Comment:    Glycemic Control Guidelines for People with Diabetes:Non Diabetic:  <6%Goal of Therapy: <7%Additional Action Suggested:  >8%   12/28/2017 08:59 AM 5.9 4.6 - 6.5 % Final    Comment:    Glycemic  Control Guidelines for People with Diabetes:Non Diabetic:  <6%Goal of Therapy: <7%Additional Action Suggested:  >8%     Scheduled Meds: . bethanechol  10 mg Oral TID  . Chlorhexidine Gluconate Cloth  6 each Topical Daily  . clopidogrel  75 mg Oral Daily  . enoxaparin (LOVENOX) injection  40 mg Subcutaneous Q24H  . furosemide  40 mg Intravenous Q12H  . gabapentin  100 mg Oral BID  . influenza vaccine adjuvanted  0.5 mL Intramuscular Tomorrow-1000  . mesalamine  1.2 g Oral QHS  . pantoprazole  40 mg Oral Daily  . pneumococcal 23 valent vaccine  0.5 mL Intramuscular Tomorrow-1000  . cyanocobalamin  1,000 mcg Oral Daily     LOS: 11 days   Cherene Altes, MD [facility] Office  [phone] Pager - Text Page per Amion  If 7PM-7AM, please contact night-coverage per Amion 06/18/2019, 9:54 AM

## 2019-06-18 NOTE — Progress Notes (Signed)
NAME:  [name] [name], MRN:  [id number], DOB:  1940-10-26, LOS: 49 ADMISSION DATE:  06/05/2019, CONSULTATION DATE:  06-24-23 REFERRING MD:  Bonner Puna, CHIEF COMPLAINT:  Chest pain on R   Brief History   78 y/o male re-admitted to [facility] after being treated for COVID pneumonia, found to have a R pneumothorax, Chest tube placed.   Past Medical History  GERD HTN Hyperlipidemia Spinal stenosis Barrett's esophagus Impaired glucose tolerance   Significant Hospital Events   9/28 d/c'd from [facility] on 4L 9/29: readmitted with worsening hypoxemia 06-24-23: brother died in [place] from Covid and sudden onset cp with ptx found.  10/10 returned to ICU with spontaneous left pneumothorax   Consults:  June 24, 2023 CCM  Procedures:  06-24-23 R 14Fr chest tube placed> removed 10/6 10/10 Left 14 Fr chest tube>   Significant Diagnostic Tests:  9/30 LE doppler:  Right: There is no evidence of deep vein thrombosis in the lower extremity. No cystic structure found in the popliteal fossa. Left: There is no evidence of deep vein thrombosis in the lower extremity. No cystic structure found in the popliteal fossa.  Micro Data:  9/19 SARS COV2: POSITIVE  Antimicrobials:  none  Interim history/subjective:   Pain in left chest today Found to have a pneumothorax on the left  Objective   Blood pressure (!) 102/59, pulse 91, temperature 97.6 F (36.4 C), temperature source Oral, resp. rate (!) 22, height 5' 7.99" (1.727 m), weight 102.8 kg, SpO2 92 %.        Intake/Output Summary (Last 24 hours) at 06/18/2019 1727 Last data filed at 06/18/2019 0900 Gross per 24 hour  Intake 740 ml  Output 1875 ml  Net -1135 ml   Filed Weights   06/10/19 1223  Weight: 102.8 kg    Examination:  General:  Mild distress in bed HENT: NCAT OP clear PULM: diminished left B, normal effort CV: RRR, no mgr GI: BS+, soft, nontender MSK: normal bulk and tone Neuro: awake, alert, no distress, MAEW=   10/5 CXR images personally reviewed  showing: small R pneumothorax  Resolved Hospital Problem list     Assessment & Plan:  Acute respiratory failure with hypoxemia due to COVID 19 pneumonia: completed treatment  Continue to wean off O2 for O2 saturation > 85%  R pneumothorax: resolved Left pneumothorax, new problem on 10/10 Move to ICU Place pigtail chest tube on left Chest tube to 20 cm water suction Flush with saline to maintain patency per protocol CXR in AM    Best practice:   Per TRH  Labs   CBC: Recent Labs  Lab 06/12/19 0518 06/16/19 0345 06/17/19 0310  WBC 6.9 10.5 10.8*  NEUTROABS 5.4  --   --   HGB 15.2 15.3 14.8  HCT 45.4 45.3 43.9  MCV 83.5 82.7 82.1  PLT 216 174 836    Basic Metabolic Panel: Recent Labs  Lab 06/13/19 0335 06/14/19 0140 06/15/19 0145 06/16/19 0345 06/17/19 0310  NA 131* 131* 130* 133* 132*  K 4.4 4.1 4.0 3.8 3.7  CL 98 97* 99 99 95*  CO2 25 25 21* 23 22  GLUCOSE 97 92 118* 126* 108*  BUN 30* 27* 23 23 35*  CREATININE 0.96 0.91 0.87 0.79 1.05  CALCIUM 8.6* 8.3* 8.3* 8.6* 8.7*   GFR: Estimated Creatinine Clearance: 68.5 mL/min (by C-G formula based on SCr of 1.05 mg/dL). Recent Labs  Lab 06/12/19 0518 06/16/19 0345 06/17/19 0310  WBC 6.9 10.5 10.8*    Liver Function  Tests: Recent Labs  Lab 06/12/19 0518 06/13/19 0335 06/14/19 0140 06/15/19 0145 06/16/19 0345  AST 110* 110* 97* 76* 71*  ALT 464* 427* 368* 323* 298*  ALKPHOS 59 60 59 58 68  BILITOT 0.8 1.1 0.8 0.6 0.9  PROT 5.5* 5.6* 5.2* 5.3* 5.6*  ALBUMIN 3.1* 3.3* 3.1* 3.2* 3.4*   No results for input(s): LIPASE, AMYLASE in the last 168 hours. No results for input(s): AMMONIA in the last 168 hours.  ABG    Component Value Date/Time   TCO2 21 (L) 05/13/2018 0925     Coagulation Profile: No results for input(s): INR, PROTIME in the last 168 hours.  Cardiac Enzymes: No results for input(s): CKTOTAL, CKMB, CKMBINDEX, TROPONINI in the last 168 hours.  HbA1C: Hgb A1c MFr Bld  Date/Time  Value Ref Range Status  02/07/2019 09:54 AM 5.4 4.6 - 6.5 % Final    Comment:    Glycemic Control Guidelines for People with Diabetes:Non Diabetic:  <6%Goal of Therapy: <7%Additional Action Suggested:  >8%   12/28/2017 08:59 AM 5.9 4.6 - 6.5 % Final    Comment:    Glycemic Control Guidelines for People with Diabetes:Non Diabetic:  <6%Goal of Therapy: <7%Additional Action Suggested:  >8%     CBG: No results for input(s): GLUCAP in the last 168 hours.   Critical care time: n/a       Roselie Awkward, MD [place] [facility] Pager: [phone] Cell: [phone] If no response, call [phone]

## 2019-06-18 NOTE — Progress Notes (Signed)
Called and updated pt's daughter, [name] [name], who was disappointed to hear that the pt had increasing O2 requirements after transferring to the chair this morning. She is understandably anxious for her dad to get to rehab and onto the next step of recovery. She did not have questions but was appreciative of the update.

## 2019-06-18 NOTE — Progress Notes (Signed)
Patient intermittently on [facility]. Patient states he got a much better nights rest tonight. Repeat Covid swab sent. Patient refuses to use incentive spirometer while in bed, states he uses it only when he is in the chair. Education provided on incentive spirometer usage. Will continue to monitor and continue with POC.

## 2019-06-18 NOTE — Progress Notes (Signed)
After transferring from the bed to the chair this morning around 0800, pt's O2 sats dropped to the low 60s. He ultimately required 15L NRB and prolonged time to recover. Pt c/o congestion and nasal trauma r/t NP swab done earlier in the morning. RT, charge nurse,  and RRT made aware. PRN meds included saline nasal spray and rescue inhaler (see documentation).  Vital Signs MEWS/VS Documentation      06/18/2019 0942 06/18/2019 1000 06/18/2019 1100 06/18/2019 1152   MEWS Score:  -  2  2  0   MEWS Score Color:  -  Yellow  Yellow  Green   Resp:  -  -  -  19   Pulse:  -  97  91  89   BP:  -  -  -  107/63   Temp:  -  -  -  98 F (36.7 C)   O2 Device:  Non-rebreather Mask  Non-rebreather Mask  Non-rebreather Mask  HFNC   O2 Flow Rate (L/min):  10 L/min  10 L/min  10 L/min  9 L/min   Level of Consciousness:  -  Alert  Alert  -           Angelia Mould 06/18/2019,11:56 AM

## 2019-06-19 ENCOUNTER — Inpatient Hospital Stay (HOSPITAL_COMMUNITY): Payer: Medicare Other

## 2019-06-19 DIAGNOSIS — J069 Acute upper respiratory infection, unspecified: Secondary | ICD-10-CM | POA: Diagnosis not present

## 2019-06-19 DIAGNOSIS — J431 Panlobular emphysema: Secondary | ICD-10-CM | POA: Diagnosis not present

## 2019-06-19 DIAGNOSIS — U071 COVID-19: Secondary | ICD-10-CM | POA: Diagnosis not present

## 2019-06-19 DIAGNOSIS — J9601 Acute respiratory failure with hypoxia: Secondary | ICD-10-CM | POA: Diagnosis not present

## 2019-06-19 LAB — CBC
HCT: 46 % (ref 39.0–52.0)
Hemoglobin: 15.1 g/dL (ref 13.0–17.0)
MCH: 27.6 pg (ref 26.0–34.0)
MCHC: 32.8 g/dL (ref 30.0–36.0)
MCV: 84.1 fL (ref 80.0–100.0)
Platelets: 175 10*3/uL (ref 150–400)
RBC: 5.47 MIL/uL (ref 4.22–5.81)
RDW: 16.9 % — ABNORMAL HIGH (ref 11.5–15.5)
WBC: 9 10*3/uL (ref 4.0–10.5)
nRBC: 0 % (ref 0.0–0.2)

## 2019-06-19 LAB — COMPREHENSIVE METABOLIC PANEL
ALT: 171 U/L — ABNORMAL HIGH (ref 0–44)
AST: 43 U/L — ABNORMAL HIGH (ref 15–41)
Albumin: 3.6 g/dL (ref 3.5–5.0)
Alkaline Phosphatase: 90 U/L (ref 38–126)
Anion gap: 11 (ref 5–15)
BUN: 40 mg/dL — ABNORMAL HIGH (ref 8–23)
CO2: 28 mmol/L (ref 22–32)
Calcium: 9 mg/dL (ref 8.9–10.3)
Chloride: 94 mmol/L — ABNORMAL LOW (ref 98–111)
Creatinine, Ser: 1.29 mg/dL — ABNORMAL HIGH (ref 0.61–1.24)
GFR calc Af Amer: >60 mL/min (ref >60)
GFR calc non Af Amer: 53 mL/min — ABNORMAL LOW (ref >60)
Glucose, Bld: 111 mg/dL — ABNORMAL HIGH (ref 70–99)
Potassium: 4.2 mmol/L (ref 3.5–5.1)
Sodium: 133 mmol/L — ABNORMAL LOW (ref 135–145)
Total Bilirubin: 1.2 mg/dL (ref 0.3–1.2)
Total Protein: 6.4 g/dL — ABNORMAL LOW (ref 6.5–8.1)

## 2019-06-19 LAB — MAGNESIUM: Magnesium: 2 mg/dL (ref 1.7–2.4)

## 2019-06-19 MED ORDER — OXYCODONE-ACETAMINOPHEN 5-325 MG PO TABS
1.0000 | ORAL_TABLET | ORAL | Status: DC | PRN
  Administered 2019-06-19 – 2019-06-20 (×2): 1 via ORAL
  Filled 2019-06-19 (×2): qty 1

## 2019-06-19 MED ORDER — ENOXAPARIN SODIUM 40 MG/0.4ML ~~LOC~~ SOLN
40.0000 mg | Freq: Two times a day (BID) | SUBCUTANEOUS | Status: DC
  Administered 2019-06-19 – 2019-06-22 (×6): 40 mg via SUBCUTANEOUS
  Filled 2019-06-19 (×6): qty 0.4

## 2019-06-19 NOTE — Progress Notes (Signed)
Dtrs called several times throughout the day.  Pt up to chair from noon to 5p.  Maintained O2 req 2-3L throughout the day.  Flushed chest tube w/ 31m saline per order at 1330.  Percocet was ordered to help bridge pain between doses of morphine.  Still with sanguinopurulent output from chest tube. *Likes country music.  Birthday is tomorrow 06/[birth year]/19.

## 2019-06-19 NOTE — Progress Notes (Signed)
NAME:  [name] [name], MRN:  [id number], DOB:  20-Jan-1941, LOS: 12 ADMISSION DATE:  06/05/2019, CONSULTATION DATE:  06/23/2023 REFERRING MD:  Bonner Puna, CHIEF COMPLAINT:  Chest pain on R   Brief History   78 y/o male re-admitted to [facility] after being treated for COVID pneumonia, found to have a R pneumothorax, Chest tube placed.   Past Medical History  GERD HTN Hyperlipidemia Spinal stenosis Barrett's esophagus Impaired glucose tolerance   Significant Hospital Events   9/28 d/c'd from [facility] on 4L 9/29: readmitted with worsening hypoxemia 06/23/2023: brother died in [place] from Covid and sudden onset cp with ptx found.  10/10 returned to ICU with spontaneous left pneumothorax   Consults:  Jun 23, 2023 CCM  Procedures:  2023/06/23 R 14Fr chest tube placed> removed 10/6 10/10 Left 14 Fr chest tube>   Significant Diagnostic Tests:  9/30 LE doppler:  Right: There is no evidence of deep vein thrombosis in the lower extremity. No cystic structure found in the popliteal fossa. Left: There is no evidence of deep vein thrombosis in the lower extremity. No cystic structure found in the popliteal fossa.  Micro Data:  9/19 SARS COV2: POSITIVE  Antimicrobials:  none  Interim history/subjective:   Had a new chest tube in left chest yesterday for spontaneous pneumothorax Has a little chest tube site pain this morning  Objective   Blood pressure 111/61, pulse 72, temperature 97.7 F (36.5 C), temperature source Oral, resp. rate 12, height 5' 7.99" (1.727 m), weight 102.8 kg, SpO2 95 %.        Intake/Output Summary (Last 24 hours) at 06/19/2019 0759 Last data filed at 06/19/2019 7001 Gross per 24 hour  Intake 470 ml  Output 1189 ml  Net -719 ml   Filed Weights   06/10/19 1223  Weight: 102.8 kg    Examination:  General:  Resting comfortably in bed HENT: NCAT OP clear PULM: CTA B, normal effort CV: RRR, no mgr GI: BS+, soft, nontender MSK: normal bulk and tone Neuro: awake, alert, no distress, MAEW    10/5 CXR images personally reviewed showing: small R pneumothorax 10/11 CXR images personally reviewed, left pigtail chest tube in place, no pneumothorax, some airspace disease unchanged  Resolved Hospital Problem list     Assessment & Plan:  Acute respiratory failure with hypoxemia due to COVID 19 pneumonia: completed treatment  Wean off O2 for O2 saturation > 85%  R pneumothorax: resolved Left pneumothorax, new problem on 10/10, still has air leak 10/11 Continue pigtail catheter in place on left Chest tube to remain on -20cm H20 this morning Flush with saline to maintain patency per protocol CXR in AM    Best practice:   Per TRH  Labs   CBC: Recent Labs  Lab 06/16/19 0345 06/17/19 0310  WBC 10.5 10.8*  HGB 15.3 14.8  HCT 45.3 43.9  MCV 82.7 82.1  PLT 174 749    Basic Metabolic Panel: Recent Labs  Lab 06/13/19 0335 06/14/19 0140 06/15/19 0145 06/16/19 0345 06/17/19 0310  NA 131* 131* 130* 133* 132*  K 4.4 4.1 4.0 3.8 3.7  CL 98 97* 99 99 95*  CO2 25 25 21* 23 22  GLUCOSE 97 92 118* 126* 108*  BUN 30* 27* 23 23 35*  CREATININE 0.96 0.91 0.87 0.79 1.05  CALCIUM 8.6* 8.3* 8.3* 8.6* 8.7*   GFR: Estimated Creatinine Clearance: 68.5 mL/min (by C-G formula based on SCr of 1.05 mg/dL). Recent Labs  Lab 06/16/19 0345 06/17/19 0310  WBC 10.5  10.8*    Liver Function Tests: Recent Labs  Lab 06/13/19 0335 06/14/19 0140 06/15/19 0145 06/16/19 0345  AST 110* 97* 76* 71*  ALT 427* 368* 323* 298*  ALKPHOS 60 59 58 68  BILITOT 1.1 0.8 0.6 0.9  PROT 5.6* 5.2* 5.3* 5.6*  ALBUMIN 3.3* 3.1* 3.2* 3.4*   No results for input(s): LIPASE, AMYLASE in the last 168 hours. No results for input(s): AMMONIA in the last 168 hours.  ABG    Component Value Date/Time   TCO2 21 (L) 05/13/2018 0925     Coagulation Profile: No results for input(s): INR, PROTIME in the last 168 hours.  Cardiac Enzymes: No results for input(s): CKTOTAL, CKMB, CKMBINDEX, TROPONINI  in the last 168 hours.  HbA1C: Hgb A1c MFr Bld  Date/Time Value Ref Range Status  02/07/2019 09:54 AM 5.4 4.6 - 6.5 % Final    Comment:    Glycemic Control Guidelines for People with Diabetes:Non Diabetic:  <6%Goal of Therapy: <7%Additional Action Suggested:  >8%   12/28/2017 08:59 AM 5.9 4.6 - 6.5 % Final    Comment:    Glycemic Control Guidelines for People with Diabetes:Non Diabetic:  <6%Goal of Therapy: <7%Additional Action Suggested:  >8%     CBG: No results for input(s): GLUCAP in the last 168 hours.   Critical care time: n/a       Roselie Awkward, MD [place] [facility] Pager: [phone] Cell: [phone] If no response, call [phone]

## 2019-06-19 NOTE — Progress Notes (Addendum)
[place] TEAM 1 - Stepdown/ICU TEAM  [name] [name]  JSE:[id number] DOB: 04/14/1941 DOA: 05/24/2019 PCP: Biagio Borg, MD    Brief Narrative:  78 year old with a history of peripheral vascular disease, COPD, HTN, HLD, ulcerative colitis, and GERD with Barrett's esophagus who was admitted for COVID pneumonia 9/19 > 9/28.  He was discharged after receiving steroids, Actemra, and Remdesivir and was requiring 4 L supplemental O2 at the time.  He reported becoming much more short of breath and severely hypoxic after returning home.  He came back to the ED 9/29 at which time he required 10 L of high flow nasal cannula.  A CXR upon his return noted unchanged bilateral pulmonary infiltrates. He was noted to have newly markedly elevated d-dimer.  Significant Events: 9/19 > 9/28 prior admit to [facility] 9/29 readmit to [facility] 10/1 spontaneous pneumothorax - R sided chest tube placed 10/6 R sided chest tube removed 10/10 L chest tube placed  COVID-19 specific Treatment: Actemra 9/25 Remdesivir 9/19 > 9/23 Decadron  Subjective: No acute issues overnight, patient tolerating chest tube quite well, moderate ongoing chest pain at the site of chest tube, moderately well controlled with morphine for short periods of time.  Otherwise patient declines nausea, vomiting, diarrhea, constipation, headache, fevers, chills.  Assessment & Plan Principal Problem:   Acute respiratory disease due to COVID-19 virus Active Problems:   Essential hypertension   Chronic universal ulcerative colitis ([place])   Acute respiratory failure with hypoxia (HCC)   Elevated liver enzymes   Pneumonia due to COVID-19 virus   Pneumothorax on right   COPD (chronic obstructive pulmonary disease) (HCC)   Ulcerative colitis (HCC)   Pneumothorax on left    COVID pneumonia - acute hypoxic respiratory failure, POA, resolving - Very slow to improve - has completed courses of Actemra, Remdesivir, and Decadron - is stable at  rest but desaturates markedly with attempts to exert himself  - Hold further diuresis given elevated creatinine  Spontaneous Left pneumothorax, ongoing Spontaneous right pneumothorax, resolved - Likely 2/2 above - L chest tube placed to suction 10/10 - air leak ongoing - CXR 10/11 personally reviewed; appears generally unchanged from yesterday; poor inspiratory effort, chest tube in adequate position without obvious pneumothorax, bilateral airspace disease stable - 14 [place] pigtail chest tube placed 10/1 -chest tube removed 10/6 -no reaccumulation on follow-up chest x-ray since chest tube removed  Elevated d-dimer, stable - CTA not able to be obtained due to contrast allergy and claustrophobia - bilateral lower extremity venous duplex negative - Lovenox transitioned back to prophylactic dose w/ pt improving clinically   Elevated AST, ALT - Ultrasound noted echogenic liver without other acute findings -viral hepatitis panel negative  - Questionably in the setting of poor p.o. intake/dehydration versus Remdesivir - Downtrending appropriately as below  Recent Labs  Lab 06/13/19 0335 06/14/19 0140 06/15/19 0145 06/16/19 0345 06/19/19 0555  AST 110* 97* 76* 71* 43*  ALT 427* 368* 323* 298* 171*  ALKPHOS 60 59 58 68 90  BILITOT 1.1 0.8 0.6 0.9 1.2  PROT 5.6* 5.2* 5.3* 5.6* 6.4*  ALBUMIN 3.3* 3.1* 3.2* 3.4* 3.6    COPD, suspected, not in acute exacerbation - Well compensated  Peripheral vascular disease - No acute complications  Acute kidney injury, ongoing - In the setting of diuresis as above, currently on hold follow morning labs  HTN - Blood pressure controlled  Hyponatremia - Improving with diuresis  Ulcerative colitis - Appears well compensated presently  GERD -  Continue home medical therapy  Morbid obesity  - Estimated body mass index is 34.47 kg/m as calculated from the following:   Height as of this encounter: 5' 7.99" (1.727 m).   Weight as of this  encounter: 102.8 kg.   DVT prophylaxis: Lovenox intermediate dose Code Status: FULL CODE Family Communication: Spoke with patient's daughter Disposition Plan: Patient will need rehab stay either in CIR or SNF depending upon his progress - repeat testing for SARS-CoV-2 previously sent off to facilitate this placement  Consultants:  PCCM  Antimicrobials:  None  Objective: Blood pressure 105/64, pulse 74, temperature 98 F (36.7 C), temperature source Oral, resp. rate 11, height 5' 7.99" (1.727 m), weight 102.8 kg, SpO2 95 %.  Intake/Output Summary (Last 24 hours) at 06/19/2019 1049 Last data filed at 06/19/2019 0619 Gross per 24 hour  Intake 230 ml  Output 714 ml  Net -484 ml   Filed Weights   06/10/19 1223  Weight: 102.8 kg    Examination: General:  Pleasantly resting in bed, No acute distress. HEENT:  Normocephalic atraumatic.  Sclerae nonicteric, noninjected.  Extraocular movements intact bilaterally. Neck:  Without mass or deformity.  Trachea is midline. Lungs/chest:  Clear to auscultate bilaterally without rhonchi, wheeze, or rales.  Left-sided chest tube intact to suction, ongoing air leak Heart:  Regular rate and rhythm.  Without murmurs, rubs, or gallops. Abdomen:  Soft, nontender, nondistended.  Without guarding or rebound. Extremities: Without cyanosis, clubbing, edema, or obvious deformity. Vascular:  Dorsalis pedis and posterior tibial pulses palpable bilaterally. Skin:  Warm and dry, no erythema, no ulcerations.  CBC: Recent Labs  Lab 06/16/19 0345 06/17/19 0310 06/19/19 0555  WBC 10.5 10.8* 9.0  HGB 15.3 14.8 15.1  HCT 45.3 43.9 46.0  MCV 82.7 82.1 84.1  PLT 174 176 465   Basic Metabolic Panel: Recent Labs  Lab 06/16/19 0345 06/17/19 0310 06/19/19 0555  NA 133* 132* 133*  K 3.8 3.7 4.2  CL 99 95* 94*  CO2 23 22 28   GLUCOSE 126* 108* 111*  BUN 23 35* 40*  CREATININE 0.79 1.05 1.29*  CALCIUM 8.6* 8.7* 9.0  MG  --   --  2.0   GFR:  Estimated Creatinine Clearance: 55.8 mL/min (A) (by C-G formula based on SCr of 1.29 mg/dL (H)).  Liver Function Tests: Recent Labs  Lab 06/14/19 0140 06/15/19 0145 06/16/19 0345 06/19/19 0555  AST 97* 76* 71* 43*  ALT 368* 323* 298* 171*  ALKPHOS 59 58 68 90  BILITOT 0.8 0.6 0.9 1.2  PROT 5.2* 5.3* 5.6* 6.4*  ALBUMIN 3.1* 3.2* 3.4* 3.6    HbA1C: Hgb A1c MFr Bld  Date/Time Value Ref Range Status  02/07/2019 09:54 AM 5.4 4.6 - 6.5 % Final    Comment:    Glycemic Control Guidelines for People with Diabetes:Non Diabetic:  <6%Goal of Therapy: <7%Additional Action Suggested:  >8%   12/28/2017 08:59 AM 5.9 4.6 - 6.5 % Final    Comment:    Glycemic Control Guidelines for People with Diabetes:Non Diabetic:  <6%Goal of Therapy: <7%Additional Action Suggested:  >8%     Scheduled Meds: . bethanechol  10 mg Oral TID  . Chlorhexidine Gluconate Cloth  6 each Topical Daily  . clopidogrel  75 mg Oral Daily  . enoxaparin (LOVENOX) injection  40 mg Subcutaneous Q24H  . gabapentin  100 mg Oral BID  . mouth rinse  15 mL Mouth Rinse BID  . mesalamine  1.2 g Oral QHS  . pantoprazole  40 mg Oral Daily  . cyanocobalamin  1,000 mcg Oral Daily     LOS: 12 days   Holli Humbles, DO [facility] Office  [phone] Pager - Text Page per Shea Evans   If 7PM-7AM, please contact night-coverage per Amion 06/19/2019, 10:49 AM

## 2019-06-19 NOTE — Progress Notes (Signed)
spole with daughter Arrie Aran. Discussed O2 level, oxygen use, chest tube and pain management. Appreciative of call

## 2019-06-20 ENCOUNTER — Inpatient Hospital Stay (HOSPITAL_COMMUNITY): Payer: Medicare Other

## 2019-06-20 DIAGNOSIS — R0902 Hypoxemia: Secondary | ICD-10-CM | POA: Diagnosis not present

## 2019-06-20 DIAGNOSIS — J9312 Secondary spontaneous pneumothorax: Secondary | ICD-10-CM

## 2019-06-20 DIAGNOSIS — Z9689 Presence of other specified functional implants: Secondary | ICD-10-CM

## 2019-06-20 DIAGNOSIS — U071 COVID-19: Secondary | ICD-10-CM | POA: Diagnosis not present

## 2019-06-20 LAB — NOVEL CORONAVIRUS, NAA (HOSP ORDER, SEND-OUT TO REF LAB; TAT 18-24 HRS): SARS-CoV-2, NAA: NOT DETECTED

## 2019-06-20 MED ORDER — OXYCODONE-ACETAMINOPHEN 5-325 MG PO TABS
1.0000 | ORAL_TABLET | ORAL | Status: DC | PRN
  Administered 2019-06-20: 1 via ORAL
  Administered 2019-06-21 – 2019-06-22 (×4): 2 via ORAL
  Filled 2019-06-20: qty 2
  Filled 2019-06-20: qty 1
  Filled 2019-06-20 (×3): qty 2

## 2019-06-20 MED ORDER — LIDOCAINE 5 % EX PTCH
1.0000 | MEDICATED_PATCH | CUTANEOUS | Status: DC
  Administered 2019-06-20 – 2019-06-22 (×3): 1 via TRANSDERMAL
  Filled 2019-06-20 (×4): qty 1

## 2019-06-20 MED ORDER — ENSURE ENLIVE PO LIQD
237.0000 mL | Freq: Three times a day (TID) | ORAL | Status: DC
  Administered 2019-06-21: 237 mL via ORAL
  Filled 2019-06-20 (×9): qty 237

## 2019-06-20 NOTE — Progress Notes (Signed)
Initial Nutrition Assessment  DOCUMENTATION CODES:   Obesity unspecified  INTERVENTION:    Ensure Enlive po TID, each supplement provides 350 kcal and 20 grams of protein.  Pt receiving Hormel Shake daily with Breakfast which provides 520 kcals and 22 g of protein and Magic cup BID with lunch and dinner, each supplement provides 290 kcal and 9 grams of protein, automatically on meal trays to optimize nutritional intake.   NUTRITION DIAGNOSIS:   Increased nutrient needs related to acute illness(COVID-19) as evidenced by estimated needs.  GOAL:   Patient will meet greater than or equal to 90% of their needs  MONITOR:   PO intake, Supplement acceptance  REASON FOR ASSESSMENT:   LOS    ASSESSMENT:   78 yo male admitted to [place] from 9/19 to 9/28 with COVID PNA. Re-admitted 9/29 with worsening SOB. PMH includes PVD, HTN, COPD, HLD, ulcerative colitis, GERD, Barrett's esophagus.   10/1 Developed spontaneous R pneumothorax and R side chest tube placed, removed 10/6. 10/10 Developed L pneumothorax and L chest tube placed.  L chest tube remains in place.   Average meal completion 33% for the past several days. Patient has been eating poorly since last admission, > 3 weeks now.   Labs reviewed. Sodium 133 (L) on 10/11  Medications reviewed and include vitamin B-12.  Per review of weight encounters, patient has lost 11.5% of usual weight within the past 3 months, which is significant for the time frame. Ongoing poor oral intake. Suspect malnutrition.   Unable to speak with patient at this time.   NUTRITION - FOCUSED PHYSICAL EXAM:  deferred  Diet Order:   Diet Order            Diet Heart Room service appropriate? Yes; Fluid consistency: Thin  Diet effective now              EDUCATION NEEDS:   Not appropriate for education at this time  Skin:  Skin Assessment: Reviewed RN Assessment  Last BM:  10/10  Height:   Ht Readings from Last 1 Encounters:  06/10/19  5' 7.99" (1.727 m)    Weight:   Wt Readings from Last 1 Encounters:  06/20/19 93.6 kg    Ideal Body Weight:  70 kg  BMI:  Body mass index is 31.38 kg/m.  Estimated Nutritional Needs:   Kcal:  2250-2500  Protein:  120-140 gm  Fluid:  >/= 2.2 L    Molli Barrows, RD, LDN, [place] Pager [phone] After Hours Pager [phone]

## 2019-06-20 NOTE — Progress Notes (Signed)
Patient became very anxious and SOB. Assisted to sit on side of bed, patient sitting in tripod position. O2 sats decreased to 60%, HFNC increased to 15L. Patient recovered after multiple minutes. No loss of conciousness. Chest tube flushed and manipulated to help draining. Patient c/o severe pain at chest tube site. Morphine 66m IVP administered to alleviate pain and difficulty breathing. Patient appears to feel better, O2 sat recovered to 96%. Will monitor patient closely.

## 2019-06-20 NOTE — Progress Notes (Signed)
[facility] TEAM 1 - Stepdown/ICU TEAM  [name] [name]  XVQ:[id number] DOB: [birth year] DOA: 05/28/2019 PCP: Biagio Borg, MD    Brief Narrative:  78 year old with a history of peripheral vascular disease, COPD, HTN, HLD, ulcerative colitis, and GERD with Barrett's esophagus who was admitted for COVID pneumonia 9/19 > 9/28.  He was discharged after receiving steroids, Actemra, and Remdesivir and was requiring 4 L supplemental O2 at the time.  He reported becoming much more short of breath and severely hypoxic after returning home.  He came back to the ED 9/29 at which time he required 10 L of high flow nasal cannula.  A CXR upon his return noted unchanged bilateral pulmonary infiltrates. He was noted to have newly markedly elevated d-dimer.  Significant Events: 9/19 > 9/28 prior admit to [facility] 9/29 readmit to [facility] 10/1 spontaneous pneumothorax - R sided chest tube placed 10/6 R sided chest tube removed 10/10 L chest tube placed  COVID-19 specific Treatment: Actemra 9/25 Remdesivir 9/19 > 9/23 Decadron completed  Subjective: Patient indicates ongoing chest pain at the site of chest tube.  Patient remarks that he has not told the nurse of his pain, has not been receiving as needed medications as scheduled due to his attempt to "power through" his pain.  Otherwise patient declines nausea, vomiting, diarrhea, constipation, headache, fevers, chills.  Assessment & Plan Principal Problem:   Acute respiratory disease due to COVID-19 virus Active Problems:   Essential hypertension   Chronic universal ulcerative colitis ([place])   Acute respiratory failure with hypoxia (HCC)   Elevated liver enzymes   Pneumonia due to COVID-19 virus   Pneumothorax on right   COPD (chronic obstructive pulmonary disease) (HCC)   Ulcerative colitis (HCC)   Pneumothorax on left   COVID pneumonia - acute hypoxic respiratory failure, POA, resolving - Very slow to improve - has completed courses of  Actemra, Remdesivir, and Decadron - is stable at rest but desaturates markedly with attempts to exert himself  - Hold further diuresis given elevated creatinine  Spontaneous Left pneumothorax, ongoing Spontaneous right pneumothorax, resolved - Likely 2/2 above - L chest tube placed to suction 10/10 - air leak ongoing -reevaluate daily - CXR 10/12 personally reviewed; appears generally unchanged from yesterday; chest tube in adequate position without obvious pneumothorax, bilateral airspace disease stable -Tolerating chest tube with ongoing pain, Percocet prescribed as needed yesterday but patient has not notifying the nurse of worsening pain.  Will attempt lidocaine patch today for improved control - 14 French pigtail chest tube placed 10/1 -chest tube removed 10/6 -no reaccumulation on follow-up chest x-ray since chest tube removed  Elevated d-dimer, stable - CTA not able to be obtained due to contrast allergy and claustrophobia - bilateral lower extremity venous duplex negative - Loveno x transitioned back to prophylactic dose w/ pt improving clinically   Elevated AST, ALT - Ultrasound noted echogenic liver without other acute findings -viral hepatitis panel negative  - Questionably in the setting of poor p.o. intake/dehydration versus Remdesivir - Downtrending appropriately as below -follow every 72 hours until discharge  Recent Labs  Lab 06/14/19 0140 06/15/19 0145 06/16/19 0345 06/19/19 0555  AST 97* 76* 71* 43*  ALT 368* 323* 298* 171*  ALKPHOS 59 58 68 90  BILITOT 0.8 0.6 0.9 1.2  PROT 5.2* 5.3* 5.6* 6.4*  ALBUMIN 3.1* 3.2* 3.4* 3.6    COPD, suspected, not in acute exacerbation - Well compensated  Peripheral vascular disease - No acute complications  Acute  kidney injury, ongoing - In the setting of diuresis as above, currently on hold -Follow every 72 hour labs -tolerating p.o. diet quite well, no indication for IV fluids  HTN - Blood pressure controlled   Hyponatremia - Improving with diuresis  Ulcerative colitis - Appears well compensated presently  GERD - Continue home medical therapy  Morbid obesity  - Estimated body mass index is 31.38 kg/m as calculated from the following:   Height as of this encounter: 5' 7.99" (1.727 m).   Weight as of this encounter: 93.6 kg.   DVT prophylaxis: Lovenox intermediate dose Code Status: FULL CODE Family Communication: Spoke with patient's daughter Disposition Plan: Patient will need rehab stay either in CIR or SNF depending upon his progress - repeat testing for SARS-CoV-2 previously sent off to facilitate this placement  Consultants:  PCCM  Antimicrobials:  None  Objective: Blood pressure (!) 111/58, pulse 96, temperature 98.2 F (36.8 C), temperature source Oral, resp. rate (!) 24, height 5' 7.99" (1.727 m), weight 93.6 kg, SpO2 95 %.  Intake/Output Summary (Last 24 hours) at 06/20/2019 1006 Last data filed at 06/20/2019 0500 Gross per 24 hour  Intake 720 ml  Output 849 ml  Net -129 ml   Filed Weights   06/10/19 1223 06/20/19 0400  Weight: 102.8 kg 93.6 kg    Examination: General:  Pleasantly resting in bed, No acute distress. HEENT:  Normocephalic atraumatic.  Sclerae nonicteric, noninjected.  Extraocular movements intact bilaterally. Neck:  Without mass or deformity.  Trachea is midline. Lungs/chest:  Clear to auscultate bilaterally without overt rhonchi, wheeze, or rales.  Left-sided chest tube intact to suction, ongoing air leak Heart:  Regular rate and rhythm.  Without murmurs, rubs, or gallops. Abdomen:  Soft, nontender, nondistended.  Without guarding or rebound. Extremities: Without cyanosis, clubbing, edema, or obvious deformity. Vascular:  Dorsalis pedis and posterior tibial pulses palpable bilaterally. Skin:  Warm and dry, no erythema, no ulcerations.  CBC: Recent Labs  Lab 06/16/19 0345 06/17/19 0310 06/19/19 0555  WBC 10.5 10.8* 9.0  HGB 15.3 14.8 15.1   HCT 45.3 43.9 46.0  MCV 82.7 82.1 84.1  PLT 174 176 076   Basic Metabolic Panel: Recent Labs  Lab 06/16/19 0345 06/17/19 0310 06/19/19 0555  NA 133* 132* 133*  K 3.8 3.7 4.2  CL 99 95* 94*  CO2 23 22 28   GLUCOSE 126* 108* 111*  BUN 23 35* 40*  CREATININE 0.79 1.05 1.29*  CALCIUM 8.6* 8.7* 9.0  MG  --   --  2.0   GFR: Estimated Creatinine Clearance: 52.4 mL/min (A) (by C-G formula based on SCr of 1.29 mg/dL (H)).  Liver Function Tests: Recent Labs  Lab 06/14/19 0140 06/15/19 0145 06/16/19 0345 06/19/19 0555  AST 97* 76* 71* 43*  ALT 368* 323* 298* 171*  ALKPHOS 59 58 68 90  BILITOT 0.8 0.6 0.9 1.2  PROT 5.2* 5.3* 5.6* 6.4*  ALBUMIN 3.1* 3.2* 3.4* 3.6    HbA1C: Hgb A1c MFr Bld  Date/Time Value Ref Range Status  02/07/2019 09:54 AM 5.4 4.6 - 6.5 % Final    Comment:    Glycemic Control Guidelines for People with Diabetes:Non Diabetic:  <6%Goal of Therapy: <7%Additional Action Suggested:  >8%   12/28/2017 08:59 AM 5.9 4.6 - 6.5 % Final    Comment:    Glycemic Control Guidelines for People with Diabetes:Non Diabetic:  <6%Goal of Therapy: <7%Additional Action Suggested:  >8%     Scheduled Meds: . bethanechol  10 mg Oral  TID  . Chlorhexidine Gluconate Cloth  6 each Topical Daily  . clopidogrel  75 mg Oral Daily  . enoxaparin (LOVENOX) injection  40 mg Subcutaneous Q12H  . gabapentin  100 mg Oral BID  . lidocaine  1 patch Transdermal Q24H  . mouth rinse  15 mL Mouth Rinse BID  . mesalamine  1.2 g Oral QHS  . pantoprazole  40 mg Oral Daily  . cyanocobalamin  1,000 mcg Oral Daily     LOS: 13 days   Holli Humbles, DO [facility] Office  [phone] Pager - Text Page per Shea Evans   If 7PM-7AM, please contact night-coverage per Amion 06/20/2019, 10:06 AM

## 2019-06-20 NOTE — Progress Notes (Signed)
Physical Therapy Treatment Patient Details Name: [name] [name] MRN: [id number] DOB: November 07, 1940 Today's Date: 06/20/2019    History of Present Illness Pt is a 78 y.o. male recently discharged hom from [facility] on 9/28, was only home a few hours then readmitted 05/31/2019 with hypoxia, has had spontaneous pneumotharax w/ chest tube x 2. . PMH includes PVC, HTN, HLD, Barrett's esophagus, ulcerative colitis.    PT Comments    The ptient on 4 L [place] with resting SPO2 93%. Assisted to recliner with SPO2 to 81%. RN aware. HR 101. Patient anxious with SOB and CT. Continue  Mobility as tolerated.   Follow Up Recommendations  Home health PT;Supervision for mobility/OOB-depends on progress and  Caregivers support.     Equipment Recommendations  (tbd)    Recommendations for Other Services       Precautions / Restrictions Precautions Precaution Comments: L Chest tube to suction, watch SpO2    Mobility  Bed Mobility Overal bed mobility: Needs Assistance Bed Mobility: Rolling;Sidelying to Sit Rolling: Min guard Sidelying to sit: Min assist       General bed mobility comments: assist to raise trunk, care to not place hand on L CT  Transfers Overall transfer level: Needs assistance Equipment used: 2 person hand held assist Transfers: Sit to/from [facility] Sit to Stand: Mod assist;+2 safety/equipment Stand pivot transfers: Mod assist;+2 safety/equipment       General transfer comment: assist to stand and steady, small steps to recliner  Ambulation/Gait                 Stairs             Wheelchair Mobility    Modified Rankin (Stroke Patients Only)       Balance                                            Cognition Arousal/Alertness: Awake/alert Behavior During Therapy: Anxious Overall Cognitive Status: Within Functional Limits for tasks assessed                                        Exercises       General Comments        Pertinent Vitals/Pain Faces Pain Scale: Hurts whole lot Pain Location: L chest tube Pain Descriptors / Indicators: Sharp;Discomfort;Grimacing Pain Intervention(s): Monitored during session    Home Living                      Prior Function            PT Goals (current goals can now be found in the care plan section) Progress towards PT goals: Progressing toward goals    Frequency    Min 3X/week      PT Plan Current plan remains appropriate    Co-evaluation              AM-PAC PT "6 Clicks" Mobility   Outcome Measure  Help needed turning from your back to your side while in a flat bed without using bedrails?: A Lot Help needed moving from lying on your back to sitting on the side of a flat bed without using bedrails?: A Lot Help needed moving to and from a bed to a chair (including a wheelchair)?:  A Lot Help needed standing up from a chair using your arms (e.g., wheelchair or bedside chair)?: A Lot Help needed to walk in hospital room?: Total Help needed climbing 3-5 steps with a railing? : Total 6 Click Score: 10    End of Session Equipment Utilized During Treatment: Oxygen Activity Tolerance: Patient tolerated treatment well Patient left: in chair;with call bell/phone within reach;with nursing/sitter in room Nurse Communication: Mobility status PT Visit Diagnosis: Other abnormalities of gait and mobility (R26.89);Muscle weakness (generalized) (M62.81)     Time: 6578-4696 PT Time Calculation (min) (ACUTE ONLY): 16 min  Charges:  $Therapeutic Activity: 8-22 mins                     Tresa Endo PT  Office [phone]    Claretha Cooper 06/20/2019, 3:14 PM

## 2019-06-20 NOTE — Progress Notes (Signed)
Daughter called and updated.

## 2019-06-20 NOTE — Progress Notes (Addendum)
NAME:  [name] [name], MRN:  [id number], DOB:  02-24-1941, LOS: 30 ADMISSION DATE:  05/29/2019, CONSULTATION DATE:  06/17/23 REFERRING MD:  Bonner Puna, CHIEF COMPLAINT:  Chest pain on R   Brief History   78 y/o male re-admitted to [facility] after being treated for COVID pneumonia, found to have a R pneumothorax, Chest tube placed.   Past Medical History  GERD HTN Hyperlipidemia Spinal stenosis Barrett's esophagus Impaired glucose tolerance  Significant Hospital Events   9/28 d/c'd from [facility] on 4L 9/29: readmitted with worsening hypoxemia 17-Jun-2023: brother died in [place] from Covid and sudden onset cp with ptx found.  10/10 returned to ICU with spontaneous left pneumothorax 10/12: Patient doing well still complaining of pain on the left side from tube insertion.  Consults:  06-17-23 CCM  Procedures:  10/1 R 14Fr chest tube placed> removed 10/6 10/10 Left 14 Fr chest tube>   Significant Diagnostic Tests:  9/30 LE doppler:  Right: There is no evidence of deep vein thrombosis in the lower extremity. No cystic structure found in the popliteal fossa. Left: There is no evidence of deep vein thrombosis in the lower extremity. No cystic structure found in the popliteal fossa.  Micro Data:  9/19 SARS COV2: POSITIVE  Antimicrobials:  none  Interim history/subjective:   Having ongoing pain from the left-sided chest tube insertion.  Still has an air leak grade 1 leak with passive tidal volume respiration.  Objective   Blood pressure 113/61, pulse 84, temperature 98.9 F (37.2 C), temperature source Oral, resp. rate 13, height 5' 7.99" (1.727 m), weight 93.6 kg, SpO2 92 %.        Intake/Output Summary (Last 24 hours) at 06/20/2019 0751 Last data filed at 06/20/2019 0500 Gross per 24 hour  Intake 960 ml  Output 849 ml  Net 111 ml   Filed Weights   06/10/19 1223 06/20/19 0400  Weight: 102.8 kg 93.6 kg    Examination:  General: Resting comfortably in bed no distress HENT: NCAT, sclera clear  PULM: Clear to auscultation bilaterally no crackles no wheeze CV: Regular rate and rhythm S1-S2 no MRG GI: Soft nontender nondistended bowel sounds present MSK: Normal bulk and tone Neuro: Awake alert following commands no focal deficit   10/12 CXR -no pneumothorax chest tube in place, stable peripheral infiltrates. The patient's images have been independently reviewed by me.    Resolved Hospital Problem list     Assessment & Plan:  Acute respiratory failure with hypoxemia due to COVID 19 pneumonia: completed treatment  Weaning off O2 as tolerated to maintain sats greater than 85%. Continue to observe.  R pneumothorax: resolved Left pneumothorax, new problem on 10/10 10/12 air leak persistent grade 1 with expiration Pigtail catheter in place to the left chest Repeat chest x-ray with stability. Keep chest tube to -20 cm of water suction.  Chest wall pain Added lidocaine patch, to be placed posterior and lateral to the chest tube insertion site.   Best practice:   Per [facility]  Labs   CBC: Recent Labs  Lab 06/16/19 0345 06/17/19 0310 06/19/19 0555  WBC 10.5 10.8* 9.0  HGB 15.3 14.8 15.1  HCT 45.3 43.9 46.0  MCV 82.7 82.1 84.1  PLT 174 176 741    Basic Metabolic Panel: Recent Labs  Lab 06/14/19 0140 06/15/19 0145 06/16/19 0345 06/17/19 0310 06/19/19 0555  NA 131* 130* 133* 132* 133*  K 4.1 4.0 3.8 3.7 4.2  CL 97* 99 99 95* 94*  CO2 25 21* 23  22 28  GLUCOSE 92 118* 126* 108* 111*  BUN 27* 23 23 35* 40*  CREATININE 0.91 0.87 0.79 1.05 1.29*  CALCIUM 8.3* 8.3* 8.6* 8.7* 9.0  MG  --   --   --   --  2.0   GFR: Estimated Creatinine Clearance: 52.4 mL/min (A) (by C-G formula based on SCr of 1.29 mg/dL (H)). Recent Labs  Lab 06/16/19 0345 06/17/19 0310 06/19/19 0555  WBC 10.5 10.8* 9.0    Liver Function Tests: Recent Labs  Lab 06/14/19 0140 06/15/19 0145 06/16/19 0345 06/19/19 0555  AST 97* 76* 71* 43*  ALT 368* 323* 298* 171*  ALKPHOS 59 58 68 90   BILITOT 0.8 0.6 0.9 1.2  PROT 5.2* 5.3* 5.6* 6.4*  ALBUMIN 3.1* 3.2* 3.4* 3.6   No results for input(s): LIPASE, AMYLASE in the last 168 hours. No results for input(s): AMMONIA in the last 168 hours.  ABG    Component Value Date/Time   TCO2 21 (L) 05/13/2018 0925     Coagulation Profile: No results for input(s): INR, PROTIME in the last 168 hours.  Cardiac Enzymes: No results for input(s): CKTOTAL, CKMB, CKMBINDEX, TROPONINI in the last 168 hours.  HbA1C: Hgb A1c MFr Bld  Date/Time Value Ref Range Status  02/07/2019 09:54 AM 5.4 4.6 - 6.5 % Final    Comment:    Glycemic Control Guidelines for People with Diabetes:Non Diabetic:  <6%Goal of Therapy: <7%Additional Action Suggested:  >8%   12/28/2017 08:59 AM 5.9 4.6 - 6.5 % Final    Comment:    Glycemic Control Guidelines for People with Diabetes:Non Diabetic:  <6%Goal of Therapy: <7%Additional Action Suggested:  >8%     CBG: No results for input(s): GLUCAP in the last 168 hours.   Garner Nash, DO [facility] 06/20/2019 7:51 AM  Personal pager: [phone] If unanswered, please page CCM On-call: [phone]

## 2019-06-21 ENCOUNTER — Inpatient Hospital Stay (HOSPITAL_COMMUNITY): Payer: Medicare Other

## 2019-06-21 DIAGNOSIS — Z9689 Presence of other specified functional implants: Secondary | ICD-10-CM | POA: Diagnosis not present

## 2019-06-21 DIAGNOSIS — R0902 Hypoxemia: Secondary | ICD-10-CM | POA: Diagnosis not present

## 2019-06-21 DIAGNOSIS — U071 COVID-19: Secondary | ICD-10-CM | POA: Diagnosis not present

## 2019-06-21 DIAGNOSIS — J9312 Secondary spontaneous pneumothorax: Secondary | ICD-10-CM | POA: Diagnosis not present

## 2019-06-21 NOTE — Progress Notes (Signed)
Spoke with [facility], updated on patient's desaturation episode and increasing pain at chest tube site. No new orders received at this time. Will notify if patient's condition worsens. O2 sat remains 93%.  Monitoring patient closely.

## 2019-06-21 NOTE — Progress Notes (Signed)
[facility] TEAM 1 - Stepdown/ICU TEAM  GALILEO COLELLO  BJS:[id number] DOB: [birth year] DOA: 05/31/2019 PCP: Biagio Borg, MD    Brief Narrative:  78 year old with a history of peripheral vascular disease, COPD, HTN, HLD, ulcerative colitis, and GERD with Barrett's esophagus who was admitted for COVID pneumonia 9/19 > 9/28.  He was discharged after receiving steroids, Actemra, and Remdesivir and was requiring 4 L supplemental O2 at the time.  He reported becoming much more short of breath and severely hypoxic after returning home.  He came back to the ED 9/29 at which time he required 10 L of high flow nasal cannula.  A CXR upon his return noted unchanged bilateral pulmonary infiltrates. He was noted to have newly markedly elevated d-dimer.  Significant Events: 9/19 > 9/28 prior admit to [facility] 9/29 readmit to [facility] 10/1 spontaneous pneumothorax - R sided chest tube placed 10/6 R sided chest tube removed 10/10 L chest tube placed  COVID-19 specific Treatment: Actemra 9/25 Remdesivir 9/19 > 9/23 Decadron completed  Subjective: Patient indicates ongoing chest pain at the site of chest tube.  Patient remarks that he has not told the nurse of his pain, has not been receiving as needed medications as scheduled due to his attempt to "power through" his pain.  Otherwise patient declines nausea, vomiting, diarrhea, constipation, headache, fevers, chills.  Assessment & Plan Principal Problem:   Acute respiratory disease due to COVID-19 virus Active Problems:   Essential hypertension   Chronic universal ulcerative colitis ([place])   Acute respiratory failure with hypoxia (HCC)   Elevated liver enzymes   Pneumonia due to COVID-19 virus   Pneumothorax on right   COPD (chronic obstructive pulmonary disease) (HCC)   Ulcerative colitis (HCC)   Pneumothorax on left    COVID pneumonia - acute hypoxic respiratory failure, POA, resolving - Very slow to improve - has completed courses of  Actemra, Remdesivir, and Decadron - is stable at rest but desaturates markedly with attempts to exert himself  - Increasing oxygen needs likely 2/2 pain and poor inspiratory effort as below - COVID 19 swab negative as of 10/09 - will not repeat unless requested by SNF  Spontaneous Left pneumothorax, ongoing Spontaneous right pneumothorax, resolved - Likely 2/2 above - Hypoxia worsening in setting of chest pain as above - L chest tube placed to suction 10/10 - air leak ongoing - minimally improving daily but not resolved - CXR 10/12 personally reviewed; appears generally unchanged from yesterday; chest tube in adequate position without obvious pneumothorax, bilateral airspace disease stable -Tolerating chest tube with ongoing pain, Percocet/morphine/lidocaine patch PRN - 14 French pigtail chest tube placed 10/1 -chest tube removed 10/6 -no reaccumulation on follow-up chest x-ray since chest tube removed  Elevated d-dimer, downtrending - CTA not able to be obtained due to contrast allergy and claustrophobia - bilateral lower extremity venous duplex negative - Loveno x transitioned back to prophylactic dose w/ pt improving clinically   Elevated AST, ALT, downtrending - Ultrasound noted echogenic liver without other acute findings -viral hepatitis panel negative  - Questionably in the setting of poor p.o. intake/dehydration versus Remdesivir - Downtrending appropriately as below -follow every 72 hours until discharge  Recent Labs  Lab 06/15/19 0145 06/16/19 0345 06/19/19 0555  AST 76* 71* 43*  ALT 323* 298* 171*  ALKPHOS 58 68 90  BILITOT 0.6 0.9 1.2  PROT 5.3* 5.6* 6.4*  ALBUMIN 3.2* 3.4* 3.6    COPD, suspected, without formal diagnosis - not in acute  exacerbation - Well compensated  Peripheral vascular disease - No acute complications  Acute kidney injury, ongoing - In the setting of diuresis as above, currently on hold -Follow every 72 hour labs -tolerating p.o. diet quite  well, no indication for IV fluids  HTN - Blood pressure controlled  Hyponatremia - Improving with diuresis  Ulcerative colitis - Appears well compensated presently  GERD - Continue home medical therapy  Morbid obesity  - Estimated body mass index is 31.38 kg/m as calculated from the following:   Height as of this encounter: 5' 7.99" (1.727 m).   Weight as of this encounter: 93.6 kg.   DVT prophylaxis: Lovenox intermediate dose Code Status: FULL CODE Family Communication: Spoke with patient's daughter Disposition Plan: Patient will need rehab stay either in CIR or SNF depending upon his progress - repeat testing for SARS-CoV-2 previously sent off to facilitate this placement  Consultants:  PCCM  Antimicrobials:  None  Objective: Blood pressure 117/63, pulse 82, temperature 98 F (36.7 C), temperature source Oral, resp. rate 17, height 5' 7.99" (1.727 m), weight 93.6 kg, SpO2 95 %.  Intake/Output Summary (Last 24 hours) at 06/21/2019 0729 Last data filed at 06/21/2019 0600 Gross per 24 hour  Intake 840 ml  Output 1291 ml  Net -451 ml   Filed Weights   06/10/19 1223 06/20/19 0400  Weight: 102.8 kg 93.6 kg    Examination: General:  Pleasantly resting in bed, No acute distress. HEENT:  Normocephalic atraumatic.  Sclerae nonicteric, noninjected.  Extraocular movements intact bilaterally. Neck:  Without mass or deformity.  Trachea is midline. Lungs/chest:  Clear to auscultate bilaterally without overt rhonchi, wheeze, or rales.  Left-sided chest tube intact to suction, ongoing air leak Heart:  Regular rate and rhythm.  Without murmurs, rubs, or gallops. Abdomen:  Soft, nontender, nondistended.  Without guarding or rebound. Extremities: Without cyanosis, clubbing, edema, or obvious deformity. Vascular:  Dorsalis pedis and posterior tibial pulses palpable bilaterally. Skin:  Warm and dry, no erythema, no ulcerations.  CBC: Recent Labs  Lab 06/16/19 0345 06/17/19  0310 06/19/19 0555  WBC 10.5 10.8* 9.0  HGB 15.3 14.8 15.1  HCT 45.3 43.9 46.0  MCV 82.7 82.1 84.1  PLT 174 176 017   Basic Metabolic Panel: Recent Labs  Lab 06/16/19 0345 06/17/19 0310 06/19/19 0555  NA 133* 132* 133*  K 3.8 3.7 4.2  CL 99 95* 94*  CO2 23 22 28   GLUCOSE 126* 108* 111*  BUN 23 35* 40*  CREATININE 0.79 1.05 1.29*  CALCIUM 8.6* 8.7* 9.0  MG  --   --  2.0   GFR: Estimated Creatinine Clearance: 52.4 mL/min (A) (by C-G formula based on SCr of 1.29 mg/dL (H)).  Liver Function Tests: Recent Labs  Lab 06/15/19 0145 06/16/19 0345 06/19/19 0555  AST 76* 71* 43*  ALT 323* 298* 171*  ALKPHOS 58 68 90  BILITOT 0.6 0.9 1.2  PROT 5.3* 5.6* 6.4*  ALBUMIN 3.2* 3.4* 3.6    HbA1C: Hgb A1c MFr Bld  Date/Time Value Ref Range Status  02/07/2019 09:54 AM 5.4 4.6 - 6.5 % Final    Comment:    Glycemic Control Guidelines for People with Diabetes:Non Diabetic:  <6%Goal of Therapy: <7%Additional Action Suggested:  >8%   12/28/2017 08:59 AM 5.9 4.6 - 6.5 % Final    Comment:    Glycemic Control Guidelines for People with Diabetes:Non Diabetic:  <6%Goal of Therapy: <7%Additional Action Suggested:  >8%     Scheduled Meds: . bethanechol  10 mg Oral TID  . Chlorhexidine Gluconate Cloth  6 each Topical Daily  . clopidogrel  75 mg Oral Daily  . enoxaparin (LOVENOX) injection  40 mg Subcutaneous Q12H  . feeding supplement (ENSURE ENLIVE)  237 mL Oral TID BM  . gabapentin  100 mg Oral BID  . lidocaine  1 patch Transdermal Q24H  . mouth rinse  15 mL Mouth Rinse BID  . mesalamine  1.2 g Oral QHS  . pantoprazole  40 mg Oral Daily  . cyanocobalamin  1,000 mcg Oral Daily     LOS: 14 days   Holli Humbles, DO [facility] Office  [phone] Pager - Text Page per Shea Evans   If 7PM-7AM, please contact night-coverage per Amion 06/21/2019, 7:29 AM

## 2019-06-21 NOTE — Progress Notes (Signed)
NAME:  [name] [name], MRN:  [id number], DOB:  1941-04-15, LOS: 35 ADMISSION DATE:  05/20/2019, CONSULTATION DATE:  07/02/2023 REFERRING MD:  Bonner Puna, CHIEF COMPLAINT:  Chest pain on R   Brief History   78 y/o male re-admitted to [facility] after being treated for COVID pneumonia, found to have a R pneumothorax, Chest tube placed.   Past Medical History  GERD HTN Hyperlipidemia Spinal stenosis Barrett's esophagus Impaired glucose tolerance  Significant Hospital Events   9/28 d/c'd from [facility] on 4L 9/29: readmitted with worsening hypoxemia 07-02-23: brother died in [place] from Covid and sudden onset cp with ptx found.  10/10 returned to ICU with spontaneous left pneumothorax 10/12 - 10/13: Pain on the left side from tube insertion, mild increase in O2 requirement   Consults:  07-02-23 CCM  Procedures:  2023-07-02 R 14Fr chest tube placed> removed 10/6 10/10 Left 14 Fr chest tube>   Significant Diagnostic Tests:  9/30 LE doppler:  Right: There is no evidence of deep vein thrombosis in the lower extremity. No cystic structure found in the popliteal fossa. Left: There is no evidence of deep vein thrombosis in the lower extremity. No cystic structure found in the popliteal fossa.  Micro Data:  9/19 SARS COV2: POSITIVE  Antimicrobials:  none  Interim history/subjective:   No issues except for left sided chest pain. Air leak still present   Objective   Blood pressure 117/63, pulse 95, temperature 98 F (36.7 C), temperature source Oral, resp. rate (!) 22, height 5' 7.99" (1.727 m), weight 93.6 kg, SpO2 94 %.        Intake/Output Summary (Last 24 hours) at 06/21/2019 0812 Last data filed at 06/21/2019 0600 Gross per 24 hour  Intake 840 ml  Output 1291 ml  Net -451 ml   Filed Weights   06/10/19 1223 06/20/19 0400  Weight: 102.8 kg 93.6 kg    Examination:  General: Resting comfortably sitting up in bed HENT: NCAT, sclera clear, tracking appropriately PULM: Clear to auscultation bilaterally,  no crackles CV: Regular rate and rhythm, S1-S2 no MRG GI: Soft nontender nondistended, bowel sounds present MSK: Normal bulk and tone, no muscle wasting Neuro: Alert oriented following commands, no focal deficit  10/13 chest x-ray: Left-sided tubes stable.  No pneumothorax. The patient's images have been independently reviewed by me.    Resolved Hospital Problem list     Assessment & Plan:  Acute respiratory failure with hypoxemia due to COVID 19 pneumonia: completed treatment  Continue to wean FiO2 to maintain sats greater than 85%.  R pneumothorax: resolved Left pneumothorax, new problem on 10/10 10/13 air leak still present, grade 1 expiration. Pigtail catheter remains in place with -20 cm of water suction. Repeat chest x-ray in a.m.  Chest wall pain Continue lidocaine patch, as needed Percocet.  Best practice:   Per [facility]  Labs   CBC: Recent Labs  Lab 06/16/19 0345 06/17/19 0310 06/19/19 0555  WBC 10.5 10.8* 9.0  HGB 15.3 14.8 15.1  HCT 45.3 43.9 46.0  MCV 82.7 82.1 84.1  PLT 174 176 812    Basic Metabolic Panel: Recent Labs  Lab 06/15/19 0145 06/16/19 0345 06/17/19 0310 06/19/19 0555  NA 130* 133* 132* 133*  K 4.0 3.8 3.7 4.2  CL 99 99 95* 94*  CO2 21* 23 22 28   GLUCOSE 118* 126* 108* 111*  BUN 23 23 35* 40*  CREATININE 0.87 0.79 1.05 1.29*  CALCIUM 8.3* 8.6* 8.7* 9.0  MG  --   --   --  2.0   GFR: Estimated Creatinine Clearance: 52.4 mL/min (A) (by C-G formula based on SCr of 1.29 mg/dL (H)). Recent Labs  Lab 06/16/19 0345 06/17/19 0310 06/19/19 0555  WBC 10.5 10.8* 9.0    Liver Function Tests: Recent Labs  Lab 06/15/19 0145 06/16/19 0345 06/19/19 0555  AST 76* 71* 43*  ALT 323* 298* 171*  ALKPHOS 58 68 90  BILITOT 0.6 0.9 1.2  PROT 5.3* 5.6* 6.4*  ALBUMIN 3.2* 3.4* 3.6   No results for input(s): LIPASE, AMYLASE in the last 168 hours. No results for input(s): AMMONIA in the last 168 hours.  ABG    Component Value Date/Time    TCO2 21 (L) 05/13/2018 0925     Coagulation Profile: No results for input(s): INR, PROTIME in the last 168 hours.  Cardiac Enzymes: No results for input(s): CKTOTAL, CKMB, CKMBINDEX, TROPONINI in the last 168 hours.  HbA1C: Hgb A1c MFr Bld  Date/Time Value Ref Range Status  02/07/2019 09:54 AM 5.4 4.6 - 6.5 % Final    Comment:    Glycemic Control Guidelines for People with Diabetes:Non Diabetic:  <6%Goal of Therapy: <7%Additional Action Suggested:  >8%   12/28/2017 08:59 AM 5.9 4.6 - 6.5 % Final    Comment:    Glycemic Control Guidelines for People with Diabetes:Non Diabetic:  <6%Goal of Therapy: <7%Additional Action Suggested:  >8%     CBG: No results for input(s): GLUCAP in the last 168 hours.    Garner Nash, DO [facility] 06/21/2019 11:38 AM  Personal pager: [phone] If unanswered, please page CCM On-call: [phone]

## 2019-06-21 NOTE — Progress Notes (Signed)
Patient's daughter called and updated.

## 2019-06-21 NOTE — Progress Notes (Signed)
Patient complaining of severe pain at chest tube insertion site. Asked nurse to page MD because he stated "it has never hurt this bad before". Appears to be in pain when he inhales deeply. Paged MD to notify of SOB episode and severe pain. Awaiting callback, will monitor patient closely.

## 2019-06-21 NOTE — Progress Notes (Signed)
RT Note: Attempted HHFNC due to desaturation and increased WOB with any movement.  Unable to tolerate flow at this time. Placed patient back on 15L [place].  Morphine given.  Patient Spo2 94%, RR 24.

## 2019-06-22 ENCOUNTER — Inpatient Hospital Stay (HOSPITAL_COMMUNITY): Payer: Medicare Other

## 2019-06-22 DIAGNOSIS — U071 COVID-19: Secondary | ICD-10-CM | POA: Diagnosis not present

## 2019-06-22 DIAGNOSIS — J069 Acute upper respiratory infection, unspecified: Secondary | ICD-10-CM | POA: Diagnosis not present

## 2019-06-22 DIAGNOSIS — J9601 Acute respiratory failure with hypoxia: Secondary | ICD-10-CM | POA: Diagnosis not present

## 2019-06-22 DIAGNOSIS — J189 Pneumonia, unspecified organism: Secondary | ICD-10-CM

## 2019-06-22 LAB — CBC
HCT: 46.1 % (ref 39.0–52.0)
Hemoglobin: 15.4 g/dL (ref 13.0–17.0)
MCH: 28.1 pg (ref 26.0–34.0)
MCHC: 33.4 g/dL (ref 30.0–36.0)
MCV: 84.1 fL (ref 80.0–100.0)
Platelets: 168 10*3/uL (ref 150–400)
RBC: 5.48 MIL/uL (ref 4.22–5.81)
RDW: 16.8 % — ABNORMAL HIGH (ref 11.5–15.5)
WBC: 16.4 10*3/uL — ABNORMAL HIGH (ref 4.0–10.5)
nRBC: 0 % (ref 0.0–0.2)

## 2019-06-22 LAB — COMPREHENSIVE METABOLIC PANEL
ALT: 61 U/L — ABNORMAL HIGH (ref 0–44)
AST: 24 U/L (ref 15–41)
Albumin: 3.1 g/dL — ABNORMAL LOW (ref 3.5–5.0)
Alkaline Phosphatase: 120 U/L (ref 38–126)
Anion gap: 10 (ref 5–15)
BUN: 37 mg/dL — ABNORMAL HIGH (ref 8–23)
CO2: 27 mmol/L (ref 22–32)
Calcium: 8.8 mg/dL — ABNORMAL LOW (ref 8.9–10.3)
Chloride: 95 mmol/L — ABNORMAL LOW (ref 98–111)
Creatinine, Ser: 1 mg/dL (ref 0.61–1.24)
GFR calc Af Amer: >60 mL/min (ref >60)
GFR calc non Af Amer: >60 mL/min (ref >60)
Glucose, Bld: 137 mg/dL — ABNORMAL HIGH (ref 70–99)
Potassium: 5 mmol/L (ref 3.5–5.1)
Sodium: 132 mmol/L — ABNORMAL LOW (ref 135–145)
Total Bilirubin: 0.8 mg/dL (ref 0.3–1.2)
Total Protein: 6 g/dL — ABNORMAL LOW (ref 6.5–8.1)

## 2019-06-22 LAB — EXPECTORATED SPUTUM ASSESSMENT W GRAM STAIN, RFLX TO RESP C

## 2019-06-22 LAB — TROPONIN I (HIGH SENSITIVITY)
Troponin I (High Sensitivity): 13 ng/L (ref <18)
Troponin I (High Sensitivity): 17 ng/L (ref <18)

## 2019-06-22 MED ORDER — MORPHINE BOLUS VIA INFUSION
1.0000 mg | INTRAVENOUS | Status: DC | PRN
  Filled 2019-06-22: qty 1

## 2019-06-22 MED ORDER — PANTOPRAZOLE SODIUM 40 MG IV SOLR: 40.0000 mg | INTRAVENOUS | Status: DC

## 2019-06-22 MED ORDER — POLYVINYL ALCOHOL 1.4 % OP SOLN
1.0000 [drp] | Freq: Four times a day (QID) | OPHTHALMIC | Status: DC | PRN
  Filled 2019-06-22: qty 15

## 2019-06-22 MED ORDER — BIOTENE DRY MOUTH MT LIQD: 15.0000 mL | OROMUCOSAL | Status: DC | PRN

## 2019-06-22 MED ORDER — DEXTROSE-NACL 5-0.9 % IV SOLN: INTRAVENOUS | Status: DC

## 2019-06-22 MED ORDER — ACETAMINOPHEN 325 MG PO TABS: 650.0000 mg | ORAL_TABLET | Freq: Four times a day (QID) | ORAL | Status: DC | PRN

## 2019-06-22 MED ORDER — VANCOMYCIN HCL 10 G IV SOLR
2000.0000 mg | Freq: Once | INTRAVENOUS | Status: AC
  Administered 2019-06-22: 2000 mg via INTRAVENOUS
  Filled 2019-06-22: qty 2000

## 2019-06-22 MED ORDER — VANCOMYCIN HCL 10 G IV SOLR: 1250.0000 mg | INTRAVENOUS | Status: DC

## 2019-06-22 MED ORDER — VANCOMYCIN HCL 10 G IV SOLR: 1750.0000 mg | INTRAVENOUS | Status: DC

## 2019-06-22 MED ORDER — MORPHINE 100MG IN NS 100ML (1MG/ML) PREMIX INFUSION
2.0000 mg/h | INTRAVENOUS | Status: DC
  Administered 2019-06-22: 14:00:00 1 mg/h via INTRAVENOUS
  Filled 2019-06-22: qty 100

## 2019-06-22 MED ORDER — IOHEXOL 350 MG/ML SOLN
100.0000 mL | Freq: Once | INTRAVENOUS | Status: AC | PRN
  Administered 2019-06-22: 100 mL via INTRAVENOUS

## 2019-06-22 MED ORDER — SENNA 8.6 MG PO TABS: 1.0000 | ORAL_TABLET | Freq: Every day | ORAL | Status: DC

## 2019-06-22 MED ORDER — ONDANSETRON HCL 4 MG/2ML IJ SOLN: 4.0000 mg | Freq: Four times a day (QID) | INTRAMUSCULAR | Status: DC | PRN

## 2019-06-22 MED ORDER — ONDANSETRON 4 MG PO TBDP: 4.0000 mg | ORAL_TABLET | Freq: Four times a day (QID) | ORAL | Status: DC | PRN

## 2019-06-22 MED ORDER — SODIUM CHLORIDE 0.9 % IV SOLN
2.0000 g | Freq: Three times a day (TID) | INTRAVENOUS | Status: DC
  Administered 2019-06-22: 2 g via INTRAVENOUS
  Filled 2019-06-22: qty 2

## 2019-06-22 MED ORDER — ACETAMINOPHEN 650 MG RE SUPP: 650.0000 mg | Freq: Four times a day (QID) | RECTAL | Status: DC | PRN

## 2019-06-22 MED ORDER — IPRATROPIUM-ALBUTEROL 20-100 MCG/ACT IN AERS
1.0000 | INHALATION_SPRAY | Freq: Four times a day (QID) | RESPIRATORY_TRACT | Status: DC
  Filled 2019-06-22: qty 4

## 2019-06-25 LAB — CULTURE, RESPIRATORY W GRAM STAIN: Culture: NORMAL

## 2019-06-27 ENCOUNTER — Ambulatory Visit: Payer: Medicare Other | Admitting: Family Medicine

## 2019-07-10 NOTE — Death Summary Note (Signed)
Death Summary  KINNETH FUJIWARA FMB:[id number] DOB: 12-08-1940 DOA: June 09, 2019  PCP: Biagio Borg, MD PCP/Office notified: No  Admit date: 06-09-19 Date of Death: Jun 25, 2019  Final Diagnoses:  Principal Problem:   Acute respiratory disease due to COVID-19 virus Active Problems:   Essential hypertension   Chronic universal ulcerative colitis ([place])   Acute respiratory failure with hypoxia ([place])   Elevated liver enzymes   Pneumonia due to COVID-19 virus   Pneumothorax on right   COPD (chronic obstructive pulmonary disease) ([place])   Ulcerative colitis ([place])   Pneumothorax on left   Multifocal pneumonia  COVID-19 Pneumonia/Acute respiratory failure with hypoxia -Remdesivir 9/19-9/23 -Steroids x10 days on initial admission. - 9/25 Actemra -Transfer to the ICU secondary to increasing pleuritic chest pain and rising d-dimer, Concern for pulmonary embolus.  Patient was unable to obtain CTA or VQ scan secondary to equipment not being available at [place].. -Lower extremity Dopplers negative -June 25, 2023 several days ago decided to decrease empiric full dose Lovenox, to ICU dosing.   06-25-23 worsening respiratory status despite LEFT pigtail chest tube in place Jun 25, 2023 PCXR showing worsening pneumonia see results below. -See spontaneous left pneumothorax  Multifocal pneumonia -Continue antibiotics for 7 to 10 days  -Combivent QID -Flutter valve as tolerated  RIGHT pneumothorax (spontaneous)  - Pigtail catheter placed 10/1>>10/6 -Resolved prior discharge  Spontaneous Left pneumothorax, -Secondary to relapse to Covid pneumonia. 2023/06/25 patient with increasing O2 demand, very close to requiring intubation.  Counseled patient on this issue and patient stated to make him DNR. -Continue maximum O2 support short of intubation. -Patient requested all aggressive care be terminated and that he be made comfort care.  Acute Pulmonary embolus - See COVID pneumonia  COPD - Negative  aspiration -See multifocal pneumonia  Chest pain -Noncardiac -LEFT sided radiating to back; secondary to spontaneous pneumothorax, and worsening multifocal pneumonia..  Essential HTN -Patient currently borderline hypotensive.[name]  PVD -Extensive vasculopathy, s/p stenting - Continue Plavix  Hyponatremia -Asymptomatic, most likely secondary to dehydration.  Fluids are started  Elevated LFT with normal bilirubin -Resolved  AKI -Resolved  GERD -Protonix IV 40 mg daily  Ulcerative colitis - Mesalamine 1.2 g daily - Currently asymptomatic.  Obesity -BMI 34  History of present illness:  Damek Ende Warrenis a15 y.o. WM PMHx,  PVD, COPD, HTN, HLD, ulcerative colitis, and GERD with Barrett's esophagus   Initially admitted for covid 1/19 06/09/2023. On discharge after receiving steroids, tocilizumab and remdesivir, he required 4L supplemental O2 and became more short of breath and hypoxic once he arrived home. He returned to the ED and was admitted 9/29 requiring 10LHFNCwith unchanged CXR infiltrates. D-dimer was elevated from prior. Therapeutic anticoagulation was started after discussion with the patient and daughter while awaiting diagnostic studies. On 10/1, hypoxia has worsened, now on 15L NRB and the patient developed right-sided pleuritic chest pain.  Hospital Course:  During his hospitalization despite maximum treatment for Covid pneumonia patient respiratory status continued to deteriorate.  Patient required a LEFT sided chest tube secondary to pneumothorax (spontaneous).  Continued to treat patient aggressively, until patient made himself comfort care and requested all aggressive treatment be withdrawn.   Time: 1702  Signed:  Dia Crawford, MD [facility] [phone]

## 2019-07-10 NOTE — Progress Notes (Signed)
Pharmacy Antibiotic Note  [name] [name] is a 78 y.o. male admitted on 05/13/2019. Patient was recently hospitalized on 9/19-9/28 for COVID-19 pneumonia and after discharge home he became short of breath and hypoxic and returned to the ED. Patient developed a right pneumothorax earlier in hospital course (10/1-10/6) which has now resolved. Patient developed a left pneumothorax and chest tube was placed on 10/10. Pharmacy has been consulted for vancomycin and cefepime dosing for pneumonia. WBC increased to 16.4. Patient is afebrile. Current CrCl is 67.55m/min, will dose cefepime for CrCl >613mmin and monitor renal function closely.   Vancomycin 1250 mg IV Q 24 hrs. Goal AUC 400-550. Expected AUC: 501 SCr used: 1.0 Vd used: 0.5   Plan: Give vancomycin 200035moading dose x1 Start vancomycn 1250m41m q24h (first dose 1100 on 10/15) Start cefepime 2g IV q8h  Monitor renal function, cultures/sensitivities, and clinical progression   Height: 5' 7.99" (172.7 cm) Weight: 206 lb 5.6 oz (93.6 kg) IBW/kg (Calculated) : 68.38  Temp (24hrs), Avg:98 F (36.7 C), Min:97.2 F (36.2 C), Max:99 F (37.2 C)  Recent Labs  Lab 06/16/19 0345 06/17/19 0310 06/19/19 0555 10/110/31/200  WBC 10.5 10.8* 9.0 16.4*  CREATININE 0.79 1.05 1.29* 1.00    Estimated Creatinine Clearance: 67.6 mL/min (by C-G formula based on SCr of 1 mg/dL).    Allergies  Allergen Reactions  . Wasp Venom Anaphylaxis  . Contrast Media [Iodinated Diagnostic Agents] Hives    Break out in welts.  Needs 13-hr prep.  . Statins Other (See Comments)    Legs pain: Atorvastatin and Lovastatin    Antimicrobials this admission: Vancomycin 10/14 >> Cefepime 10/14 >>  Dose adjustments this admission: N/A  Microbiology results: 10/10 MRSA PCR: negative 10/14 sputum: ordered  Thank you for allowing pharmacy to be a part of this patient's care.  GracCristela FeltarmD PGY1 Pharmacy Resident [employer]: [phone]  PM

## 2019-07-10 NOTE — Progress Notes (Signed)
OT Cancellation Note  Patient Details Name: [name] [name] MRN: [id number] DOB: 18-Jun-1941   Cancelled Treatment:    Reason Eval/Treat Not Completed: Other (comment); noted per chart and spoke with RN, pt transitioning to comfort care at this time. Acute OT to sign off. Please re-consult should pt's needs change.  Lou Cal, OT [facility] Pager [phone] Office [phone]   [name] [name] 2019/06/23, 2:13 PM

## 2019-07-10 NOTE — Progress Notes (Signed)
71m of Morphine wasted with TWilliam Hamburger RN

## 2019-07-10 NOTE — Progress Notes (Addendum)
NAME:  [name] [name], MRN:  [id number], DOB:  May 18, 1941, LOS: 70 ADMISSION DATE:  05/31/2019, CONSULTATION DATE:  07-01-23 REFERRING MD:  Bonner Puna, CHIEF COMPLAINT:  Chest pain on R   Brief History   78 y/o male re-admitted to [facility] after being treated for COVID pneumonia, found to have a R pneumothorax, Chest tube placed.   Past Medical History  GERD HTN Hyperlipidemia Spinal stenosis Barrett's esophagus Impaired glucose tolerance   Significant Hospital Events   9/28 d/c'd from [facility] on 4L 9/29: readmitted with worsening hypoxemia 07/01/2023: brother died in [place] from Covid and sudden onset cp with ptx found.  10/10 returned to ICU with spontaneous left pneumothorax  Consults:  2023/07/01 CCM  Procedures:  07/01/2023 R 14Fr chest tube placed> removed 10/6 10/10 Left 14 Fr chest tube>   Significant Diagnostic Tests:  9/30 LE doppler:  Right: There is no evidence of deep vein thrombosis in the lower extremity. No cystic structure found in the popliteal fossa. Left: There is no evidence of deep vein thrombosis in the lower extremity. No cystic structure found in the popliteal fossa.  Micro Data:  9/19 SARS COV2: POSITIVE  Antimicrobials:  none  Interim history/subjective:   Feels much worse Increasing O2 demands overnight Chest pain Dyspnea Cough, increasing mucus production Morphine helps, but only lasts a few minutes  Objective   Blood pressure (!) 165/68, pulse (!) 121, temperature 99 F (37.2 C), temperature source Axillary, resp. rate (!) 35, height 5' 7.99" (1.727 m), weight 93.6 kg, SpO2 (!) 70 %.        Intake/Output Summary (Last 24 hours) at 07/14/2019 0743 Last data filed at 07-14-2019 0600 Gross per 24 hour  Intake 480 ml  Output 1150 ml  Net -670 ml   Filed Weights   06/10/19 1223 06/20/19 0400  Weight: 102.8 kg 93.6 kg    Examination:  General:  Increased respiratory distress in bed HENT: NCAT OP clear PULM: Crackles bilaterally with wheezing and rhonchi B,  normal effort CV: RRR, no mgr GI: BS+, soft, nontender MSK: normal bulk and tone Neuro: awake, alert, MAEW  10/5 CXR images personally reviewed showing: small R pneumothorax 10/11 CXR images personally reviewed, left pigtail chest tube in place, no pneumothorax, some airspace disease unchanged  Resolved Hospital Problem list     Assessment & Plan:  Acute respiratory failure with hypoxemia due to COVID 19 pneumonia: completed treatment, dramatically worse 10/14 CT chest > no PE, worsening bilateral infiltrates Will not survive, doesn't want to go on life support Start morphine infusion today for comfort, notify family Wean off O2 for O2 saturation > 85%  R pneumothorax: resolved Left pneumothorax, new problem on 10/10, still has air leak 10/14 Continue chest tube to suction for now  Goals of care: I met with the patient twice today to discuss his overall worsening condition, he confirms he is DNR and just wants to be comfortable at this point  Best practice:   Per [facility]  Labs   CBC: Recent Labs  Lab 06/16/19 0345 06/17/19 0310 06/19/19 0555 Jul 14, 2019 0530  WBC 10.5 10.8* 9.0 16.4*  HGB 15.3 14.8 15.1 15.4  HCT 45.3 43.9 46.0 46.1  MCV 82.7 82.1 84.1 84.1  PLT 174 176 175 643    Basic Metabolic Panel: Recent Labs  Lab 06/16/19 0345 06/17/19 0310 06/19/19 0555  NA 133* 132* 133*  K 3.8 3.7 4.2  CL 99 95* 94*  CO2 23 22 28   GLUCOSE 126* 108* 111*  BUN 23 35* 40*  CREATININE 0.79 1.05 1.29*  CALCIUM 8.6* 8.7* 9.0  MG  --   --  2.0   GFR: Estimated Creatinine Clearance: 52.4 mL/min (A) (by C-G formula based on SCr of 1.29 mg/dL (H)). Recent Labs  Lab 06/16/19 0345 06/17/19 0310 06/19/19 0555 06/30/19 0530  WBC 10.5 10.8* 9.0 16.4*    Liver Function Tests: Recent Labs  Lab 06/16/19 0345 06/19/19 0555  AST 71* 43*  ALT 298* 171*  ALKPHOS 68 90  BILITOT 0.9 1.2  PROT 5.6* 6.4*  ALBUMIN 3.4* 3.6   No results for input(s): LIPASE, AMYLASE in the  last 168 hours. No results for input(s): AMMONIA in the last 168 hours.  ABG    Component Value Date/Time   TCO2 21 (L) 05/13/2018 0925     Coagulation Profile: No results for input(s): INR, PROTIME in the last 168 hours.  Cardiac Enzymes: No results for input(s): CKTOTAL, CKMB, CKMBINDEX, TROPONINI in the last 168 hours.  HbA1C: Hgb A1c MFr Bld  Date/Time Value Ref Range Status  02/07/2019 09:54 AM 5.4 4.6 - 6.5 % Final    Comment:    Glycemic Control Guidelines for People with Diabetes:Non Diabetic:  <6%Goal of Therapy: <7%Additional Action Suggested:  >8%   12/28/2017 08:59 AM 5.9 4.6 - 6.5 % Final    Comment:    Glycemic Control Guidelines for People with Diabetes:Non Diabetic:  <6%Goal of Therapy: <7%Additional Action Suggested:  >8%     CBG: No results for input(s): GLUCAP in the last 168 hours.   Critical care time: 35 minutes       Roselie Awkward, MD [facility] Pager: [phone] Cell: [phone] If no response, call [phone]

## 2019-07-10 NOTE — Progress Notes (Signed)
Pt transported with RN to comfort care room to visit with family. Pt placed on 100% 15L NRB for transport and tolerated as to be expected. Upon arrival back to room comfort measures carried out and per pt request oxygen was taken off at 1635. RN and RT remained at pt bedside.

## 2019-07-10 NOTE — Progress Notes (Signed)
Provided an update to patient's daughter, Arrie Aran. Answered all questions.

## 2019-07-10 NOTE — Progress Notes (Signed)
Spoke with [name] regarding change in [name] [name] condition. [name] [name] O2 sats dropped to 50's, sitting on side of bed attempting tripod position. HR increased to 145 and remaining 120-130's. C/O pain at chest insertion site and middle of chest from difficulty breathing. Morphine 24m administered to alleviate pain and air hunger. Mentioned to GElzie Ringsthat I do not think [name] [name] would be able to take PO medications during this episode due to high risk of aspiration. [name] [name] seems to respond well to Morphine administration. Appears more calm, O2 sat increased to 90%, HR remains high, but will continue to monitor. CXR ordered.

## 2019-07-10 NOTE — Care Management Important Message (Signed)
Important Message  Patient Details  Name: [name] [name] MRN: [id number] Date of Birth: 10-21-1940   Medicare Important Message Given:  Yes - Important Message mailed due to current National Emergency    Verbal consent obtained due to current National Emergency  Relationship to patient: Child Contact Name: [name] Call Date: 2019/07/09  Time: 1211 Phone: [phone] Outcome: Spoke with contact Important Message mailed to: Patient address on file     [name] [name] Jul 09, 2019, 12:12 PM

## 2019-07-10 NOTE — Progress Notes (Signed)
PROGRESS NOTE    CHIEF WALKUP  VEL:[id number] DOB: 03-08-1941 DOA: 05/23/2019 PCP: Biagio Borg, MD   Brief Narrative:  [name] [name] is a 78 y.o. WM PMHx,  PVD, COPD, HTN, HLD, ulcerative colitis, and GERD with Barrett's esophagus   Initially admitted for covid 1/19 - 9/28. On discharge after receiving steroids, tocilizumab and remdesivir, he required 4L supplemental O2 and became more short of breath and hypoxic once he arrived home. He returned to the ED and was admitted 9/29 requiring 10L HFNC with unchanged CXR infiltrates. D-dimer was elevated from prior. Therapeutic anticoagulation was started after discussion with the patient and daughter while awaiting diagnostic studies. On 10/1, hypoxia has worsened, now on 15L NRB and the patient developed right-sided pleuritic chest pain.    Subjective: 10/14 A/O x4, per RN [place] patient stated he is ready to to go.  Positive CP, positive S OB.  Negative abdominal pain negative N/V.      Assessment & Plan:   Principal Problem:   Acute respiratory disease due to COVID-19 virus Active Problems:   Essential hypertension   Chronic universal ulcerative colitis ([place])   Acute respiratory failure with hypoxia (HCC)   Elevated liver enzymes   Pneumonia due to COVID-19 virus   Pneumothorax on right   COPD (chronic obstructive pulmonary disease) (HCC)   Ulcerative colitis ([place])   Pneumothorax on left  COVID-19 Pneumonia/Acute respiratory failure with hypoxia -Remdesivir 9/19-9/23 -Steroids x10 days on initial admission. - 9/25 Actemra -Transfer to the ICU secondary to increasing pleuritic chest pain and rising d-dimer, Concern for pulmonary embolus.  Patient was unable to obtain CTA or VQ scan secondary to equipment not being available at [facility].. -Lower extremity Dopplers negative -10/14 several days ago decided to decrease empiric full dose Lovenox, to ICU dosing.   -10/14 CTA chest PE protocol pending -10/14 worsening respiratory  status despite LEFT pigtail chest tube in place -10/14 PCXR showing worsening pneumonia see results below. -D5-0.9% saline 49m/hr  Multifocal pneumonia -Continue antibiotics for 7 to 10 days  -Combivent QID -Flutter valve as tolerated  RIGHT pneumothorax (spontaneous)  - Pigtail catheter placed 10/1>>10/6 -Resolved prior discharge  Spontaneous Left pneumothorax, -Secondary to relapse to Covid pneumonia. -10/14 patient with increasing O2 demand, very close to requiring intubation.  Counseled patient on this issue and patient stated to make him DNR. -Continue maximum O2 support short of intubation.  Acute Pulmonary embolus - See COVID pneumonia  COPD - Negative aspiration -See multifocal pneumonia  Chest pain -Noncardiac -LEFT sided radiating to back; secondary to spontaneous pneumothorax, and worsening multifocal pneumonia..  Essential HTN -Patient currently borderline hypotensive..[name] PVD -Extensive vasculopathy, s/p stenting - Continue Plavix  Hyponatremia -Asymptomatic, most likely secondary to dehydration.  Fluids are started  Elevated LFT with normal bilirubin -Resolved  AKI -Resolved  GERD -Protonix IV 40 mg daily  Ulcerative colitis - Mesalamine 1.2 g daily - Currently asymptomatic.  Obesity -BMI 34    DVT prophylaxis: Lovenox (ICU dose) Code Status: Full Family Communication: 10/14 spoke with [place]daughter) counseled her that fathers respiratory status was deteriorating.  Counseled [name] that we had spoken at length this a.m. regarding possible need to intubate patient, and patient had stated he would like to be made DNR.  Counseled daughter on that unless patient's respiratory status miraculously improved that he had a slim chance of surviving this hospitalization. Disposition Plan: TBD   Consultants:  PCCM Neurology   Procedures/Significant Events:  RIGHT chest pigtail catheter placed  10/1>>10/6 LEFT chest tube 10/10>> 10/14  PCXR;-left-sided chest tube stable in position with trace left -Worsening severe multilobar bilateral pneumonia     I have personally reviewed and interpreted all radiology studies and my findings are as above.  VENTILATOR SETTINGS:    Cultures   Antimicrobials: Anti-infectives (From admission, onward)   Start     Stop   06/23/19 1100  vancomycin (VANCOCIN) 1,750 mg in sodium chloride 0.9 % 500 mL IVPB  Status:  Discontinued     06/27/2019 1243   06/23/19 1100  vancomycin (VANCOCIN) 1,250 mg in sodium chloride 0.9 % 250 mL IVPB         06-27-19 1030  vancomycin (VANCOCIN) 2,000 mg in sodium chloride 0.9 % 500 mL IVPB         06-27-19 1000  ceFEPIme (MAXIPIME) 2 g in sodium chloride 0.9 % 100 mL IVPB             Devices    LINES / TUBES:  LEFT chest tube 10/10>>    Continuous Infusions:   Objective: Vitals:   06-27-19 0500 2019/06/27 0600 June 27, 2019 0700 06/27/19 0702  BP: 132/69 (!) 122/58  (!) 165/68  Pulse: 99 100 (!) 116 (!) 121  Resp: (!) 29 (!) 24 (!) 39 (!) 35  Temp:      TempSrc:      SpO2: (!) 89% 96% (!) 59% (!) 70%  Weight:      Height:        Intake/Output Summary (Last 24 hours) at 2019-06-27 0739 Last data filed at 06/27/19 0600 Gross per 24 hour  Intake 480 ml  Output 1150 ml  Net -670 ml   Filed Weights   06/10/19 1223 06/20/19 0400  Weight: 102.8 kg 93.6 kg   Physical Exam:  General: A/O x4, significant positive acute respiratory distress Eyes: negative scleral hemorrhage, negative anisocoria, negative icterus ENT: Negative Runny nose, negative gingival bleeding, Neck:  Negative scars, masses, torticollis, lymphadenopathy, JVD Lungs: Tachypneic, bilateral decreased breath sounds  without wheezes or crackles Cardiovascular: Tachycardic, without murmur gallop or rub normal S1 and S2 Abdomen: negative abdominal pain, nondistended, positive soft, bowel sounds, no rebound, no ascites, no appreciable mass Extremities: No significant  cyanosis, clubbing, or edema bilateral lower extremities Skin: Negative rashes, lesions, ulcers Psychiatric:  Negative depression, negative anxiety, negative fatigue, negative mania  Central nervous system:  Cranial nerves II through XII intact, tongue/uvula midline, all extremities muscle strength 5/5, sensation intact throughout, negative dysarthria, negative expressive aphasia, negative receptive aphasia.     Data Reviewed: Care during the described time interval was provided by me .  I have reviewed this patient's available data, including medical history, events of note, physical examination, and all test results as part of my evaluation.   CBC: Recent Labs  Lab 06/16/19 0345 06/17/19 0310 06/19/19 0555 2019-06-27 0530  WBC 10.5 10.8* 9.0 16.4*  HGB 15.3 14.8 15.1 15.4  HCT 45.3 43.9 46.0 46.1  MCV 82.7 82.1 84.1 84.1  PLT 174 176 175 324   Basic Metabolic Panel: Recent Labs  Lab 06/16/19 0345 06/17/19 0310 06/19/19 0555  NA 133* 132* 133*  K 3.8 3.7 4.2  CL 99 95* 94*  CO2 23 22 28   GLUCOSE 126* 108* 111*  BUN 23 35* 40*  CREATININE 0.79 1.05 1.29*  CALCIUM 8.6* 8.7* 9.0  MG  --   --  2.0   GFR: Estimated Creatinine Clearance: 52.4 mL/min (A) (by C-G formula based on SCr of 1.29  mg/dL (H)). Liver Function Tests: Recent Labs  Lab 06/16/19 0345 06/19/19 0555  AST 71* 43*  ALT 298* 171*  ALKPHOS 68 90  BILITOT 0.9 1.2  PROT 5.6* 6.4*  ALBUMIN 3.4* 3.6   No results for input(s): LIPASE, AMYLASE in the last 168 hours. No results for input(s): AMMONIA in the last 168 hours. Coagulation Profile: No results for input(s): INR, PROTIME in the last 168 hours. Cardiac Enzymes: No results for input(s): CKTOTAL, CKMB, CKMBINDEX, TROPONINI in the last 168 hours. BNP (last 3 results) Recent Labs    02/07/19 0954  PROBNP 57.0   HbA1C: No results for input(s): HGBA1C in the last 72 hours. CBG: No results for input(s): GLUCAP in the last 168 hours. Lipid Profile:  No results for input(s): CHOL, HDL, LDLCALC, TRIG, CHOLHDL, LDLDIRECT in the last 72 hours. Thyroid Function Tests: No results for input(s): TSH, T4TOTAL, FREET4, T3FREE, THYROIDAB in the last 72 hours. Anemia Panel: No results for input(s): VITAMINB12, FOLATE, FERRITIN, TIBC, IRON, RETICCTPCT in the last 72 hours. Urine analysis:    Component Value Date/Time   COLORURINE YELLOW 02/07/2019 [place] 02/07/2019 0954   LABSPEC 1.020 02/07/2019 0954   PHURINE 6.0 02/07/2019 0954   GLUCOSEU NEGATIVE 02/07/2019 0954   HGBUR NEGATIVE 02/07/2019 0954   BILIRUBINUR NEGATIVE 02/07/2019 0954   KETONESUR NEGATIVE 02/07/2019 0954   PROTEINUR NEGATIVE 07/12/2018 1506   UROBILINOGEN 0.2 02/07/2019 0954   NITRITE NEGATIVE 02/07/2019 0954   LEUKOCYTESUR NEGATIVE 02/07/2019 0954   Sepsis Labs: @LABRCNTIP (procalcitonin:4,lacticidven:4)  ) Recent Results (from the past 240 hour(s))  Novel Coronavirus, NAA (Hosp order, Send-out to Ref Lab; TAT 18-24 hrs     Status: None   Collection Time: 06/17/19  4:23 PM   Specimen: Nasopharyngeal Swab; Respiratory  Result Value Ref Range Status   SARS-CoV-2, NAA NOT DETECTED NOT DETECTED Final    Comment: (NOTE) This nucleic acid amplification test was developed and its performance characteristics determined by [employer]. Nucleic acid amplification tests include PCR and TMA. This test has not been FDA cleared or approved. This test has been authorized by FDA under an Emergency Use Authorization (EUA). This test is only authorized for the duration of time the declaration that circumstances exist justifying the authorization of the emergency use of in vitro diagnostic tests for detection of SARS-CoV-2 virus and/or diagnosis of COVID-19 infection under section 564(b)(1) of the Act, 21 U.S.C. 329JJO-8(C) (1), unless the authorization is terminated or revoked sooner. When diagnostic testing is negative, the possibility of a false  negative result should be considered in the context of a patient's recent exposures and the presence of clinical signs and symptoms consistent with COVID-19. An individual without symptoms of COVID- 19 and who is not shedding SARS-CoV-2 vi rus would expect to have a negative (not detected) result in this assay. Performed At: [facility] [place], [place] [id number] Rush Farmer MD WF:[id number]    [place]  Final    Comment: Performed at [facility] [address], [place], [place] [address]  MRSA PCR Screening     Status: None   Collection Time: 06/18/19  4:49 PM   Specimen: Nasal Mucosa; Nasopharyngeal  Result Value Ref Range Status   MRSA by PCR NEGATIVE NEGATIVE Final    Comment:        The GeneXpert MRSA Assay (FDA approved for NASAL specimens only), is one component of a comprehensive MRSA colonization surveillance program. It is not intended  to diagnose MRSA infection nor to guide or monitor treatment for MRSA infections. Performed at [facility], [place] [address], [place], [place] [address]          Radiology Studies: Dg Chest Port 1 View  Result Date: 06/21/2019 CLINICAL DATA:  Pneumothorax EXAM: PORTABLE CHEST 1 VIEW COMPARISON:  06/20/2019 FINDINGS: Left chest tube remains in place. No pneumothorax. Bilateral peripheral pulmonary infiltrates are again noted, unchanged. Low lung volumes. Heart is normal size. IMPRESSION: No pneumothorax.  No change. Electronically Signed   By: Rolm Baptise M.D.   On: 06/21/2019 08:21   Dg Chest Port 1 View  Result Date: 06/20/2019 CLINICAL DATA:  COVID-19 pneumonia and status post left chest tube placement for spontaneous pneumothorax. EXAM: PORTABLE CHEST 1 VIEW COMPARISON:  06/19/2019 FINDINGS: The heart size and mediastinal contours are within normal limits. Left lateral pigtail chest tube remains in place with no visible pneumothorax.  Bilateral peripheral pulmonary infiltrates appears stable. Lung volumes are low bilaterally. No significant pleural effusions. The visualized skeletal structures are unremarkable. IMPRESSION: No pneumothorax with stable positioning of left-sided chest tube. Stable bilateral peripheral pulmonary infiltrates. Electronically Signed   By: Aletta Edouard M.D.   On: 06/20/2019 08:51        Scheduled Meds: . bethanechol  10 mg Oral TID  . Chlorhexidine Gluconate Cloth  6 each Topical Daily  . clopidogrel  75 mg Oral Daily  . enoxaparin (LOVENOX) injection  40 mg Subcutaneous Q12H  . feeding supplement (ENSURE ENLIVE)  237 mL Oral TID BM  . gabapentin  100 mg Oral BID  . lidocaine  1 patch Transdermal Q24H  . mouth rinse  15 mL Mouth Rinse BID  . mesalamine  1.2 g Oral QHS  . pantoprazole  40 mg Oral Daily  . cyanocobalamin  1,000 mcg Oral Daily   Continuous Infusions:   LOS: 15 days   The patient is critically ill with multiple organ systems failure and requires high complexity decision making for assessment and support, frequent evaluation and titration of therapies, application of advanced monitoring technologies and extensive interpretation of multiple databases. Critical Care Time devoted to patient care services described in this note  Time spent: 40 minutes     WOODS, Geraldo Docker, MD [facility] Pager [phone]  If 7PM-7AM, please contact night-coverage [email or web address] Password [facility] 2019-07-05, 7:39 AM

## 2019-07-10 NOTE — Progress Notes (Signed)
Spoke with [name] regarding patient's O2 sat declining and c/o severe pain at chest tube insertion site. Patient sitting up on side of bed moaning, guarding side, and tachypneic. Wearing 15L HFNC with supplemental 15L NRB. No new orders received at this time, awaiting to see how Morphine dose just administered affects patient's status. Will monitor closely.

## 2019-07-10 NOTE — Progress Notes (Signed)
Patient belongings sent home with his daughter, Arrie Aran. Items included cellphone, upper and lower dentures, clothing, charger, and birthday cards.

## 2019-07-10 DEATH — deceased

## 2019-08-08 ENCOUNTER — Ambulatory Visit: Payer: Medicare Other | Admitting: Internal Medicine

## 2020-07-25 IMAGING — DX DG CHEST 1V PORT
1 series · 1 of 1 positions shown · non-contrast
Comparison: 06/09/2019

CLINICAL DATA: [id number] positive.

EXAM:
PORTABLE CHEST 1 VIEW

[chest]
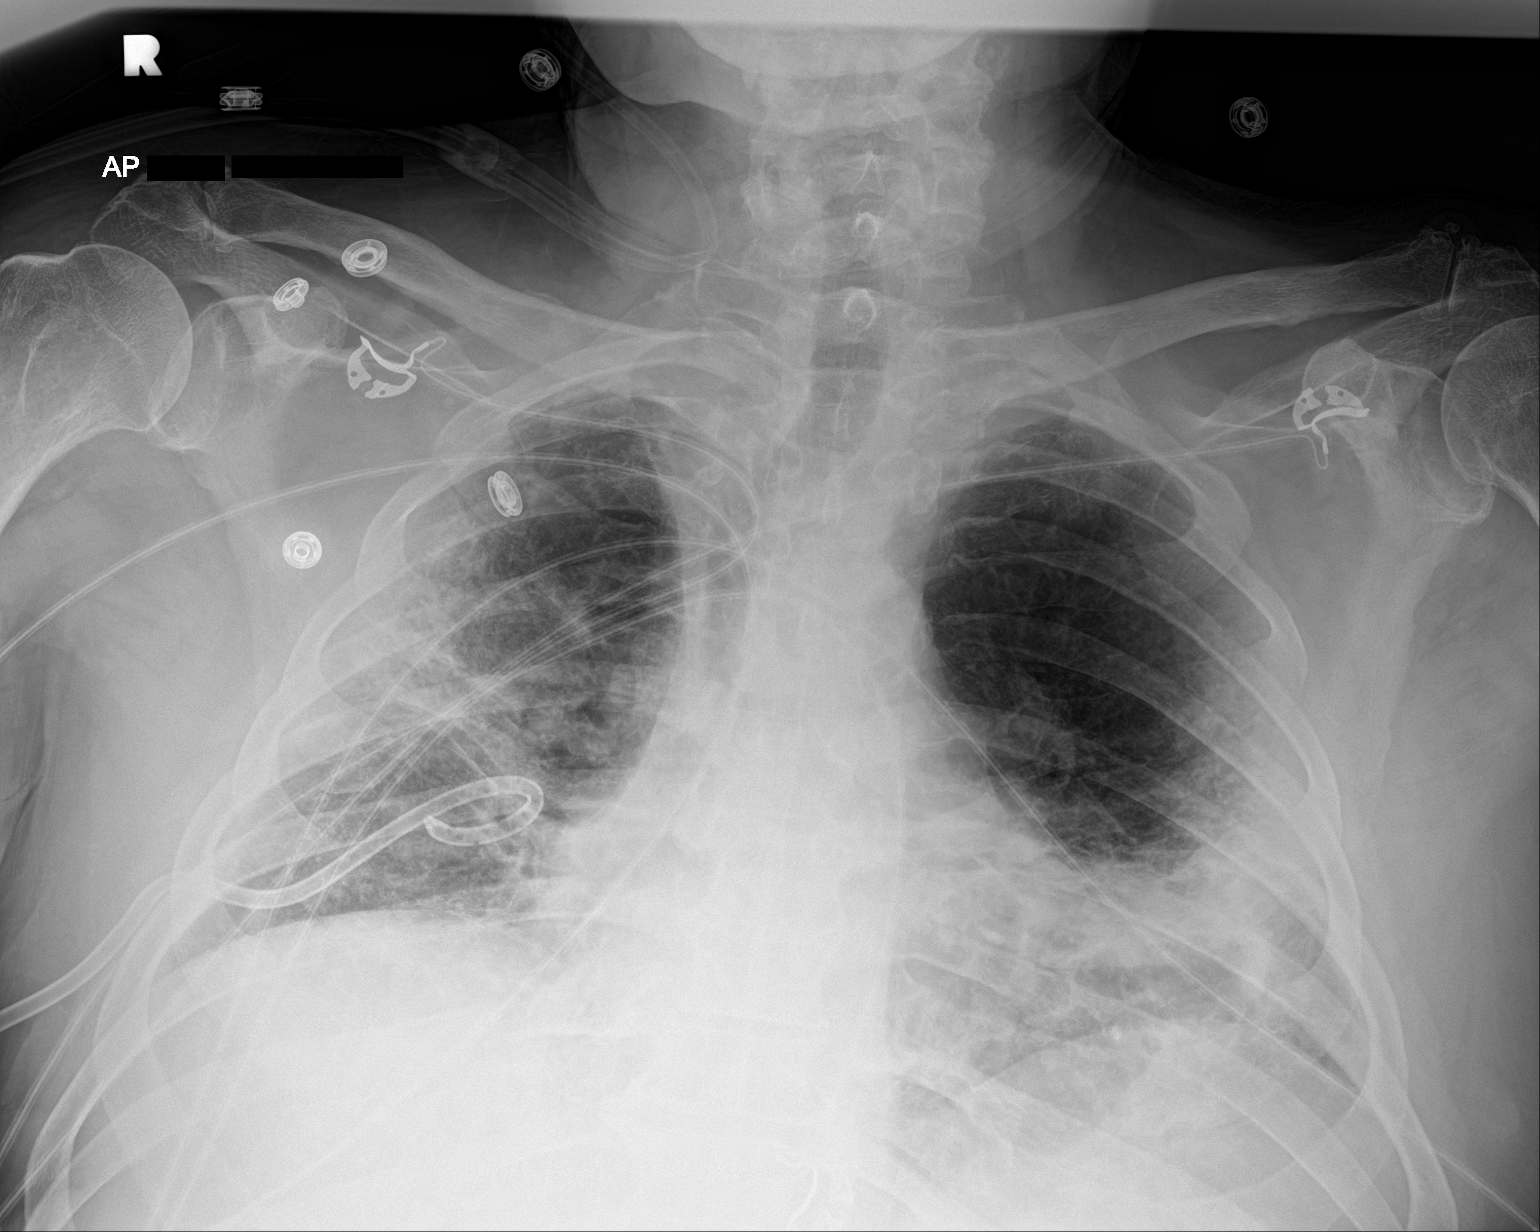

[1 of 1 positions shown; findings below may reference images not displayed]

FINDINGS: Right-sided pleural drainage catheter unchanged. Lungs are
hypoinflated with stable patchy bilateral predominantly peripheral
airspace process likely multifocal pneumonia. No effusion.
Cardiomediastinal silhouette and remainder of the exam is unchanged.
IMPRESSION: Persistent multifocal peripheral airspace process likely multifocal
pneumonia.

Right-sided pleural drainage catheter unchanged.

## 2020-07-28 IMAGING — DX DG CHEST 1V PORT
1 series · 1 of 1 positions shown · non-contrast
Comparison: 06/14/2019, 06/13/2019, 06/12/2019

CLINICAL DATA: Chest tube removal

EXAM:
PORTABLE CHEST 1 VIEW

[chest]
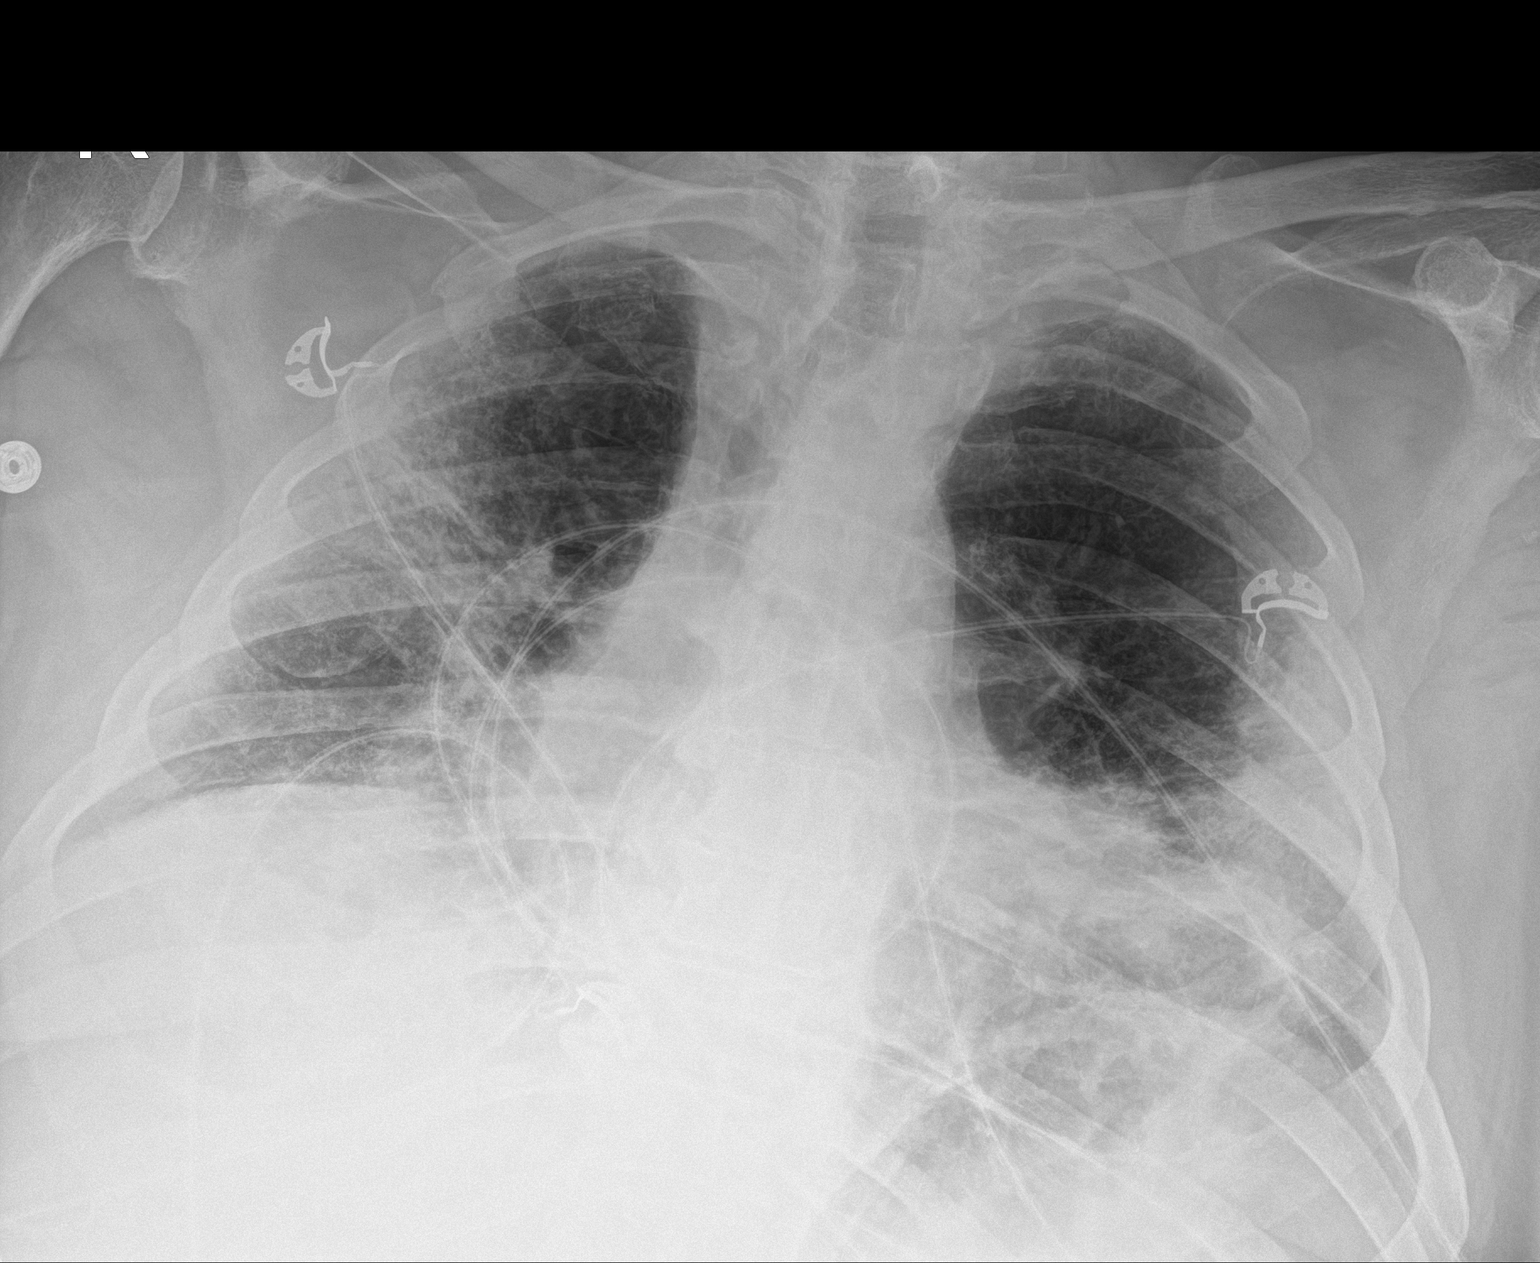

[1 of 1 positions shown; findings below may reference images not displayed]

FINDINGS: Interim removal of right-sided chest drainage catheter. No
definitive right pneumothorax identified. Low lung volumes with
bilateral patchy airspace infiltrates within both lungs, similar
distribution and no significant change. Stable cardiomediastinal
silhouette.
IMPRESSION: 1. Interim removal of right chest tube without definitive
pneumothorax on the right
2. Similar appearance of bilateral pulmonary infiltrates.

## 2020-07-28 IMAGING — DX DG CHEST 1V PORT
1 series · 1 of 1 positions shown · non-contrast
Comparison: Portable exam 2233 hours compared to 06/13/2019

CLINICAL DATA: RIGHT pneumothorax, follow-up

EXAM:
PORTABLE CHEST 1 VIEW

[chest]
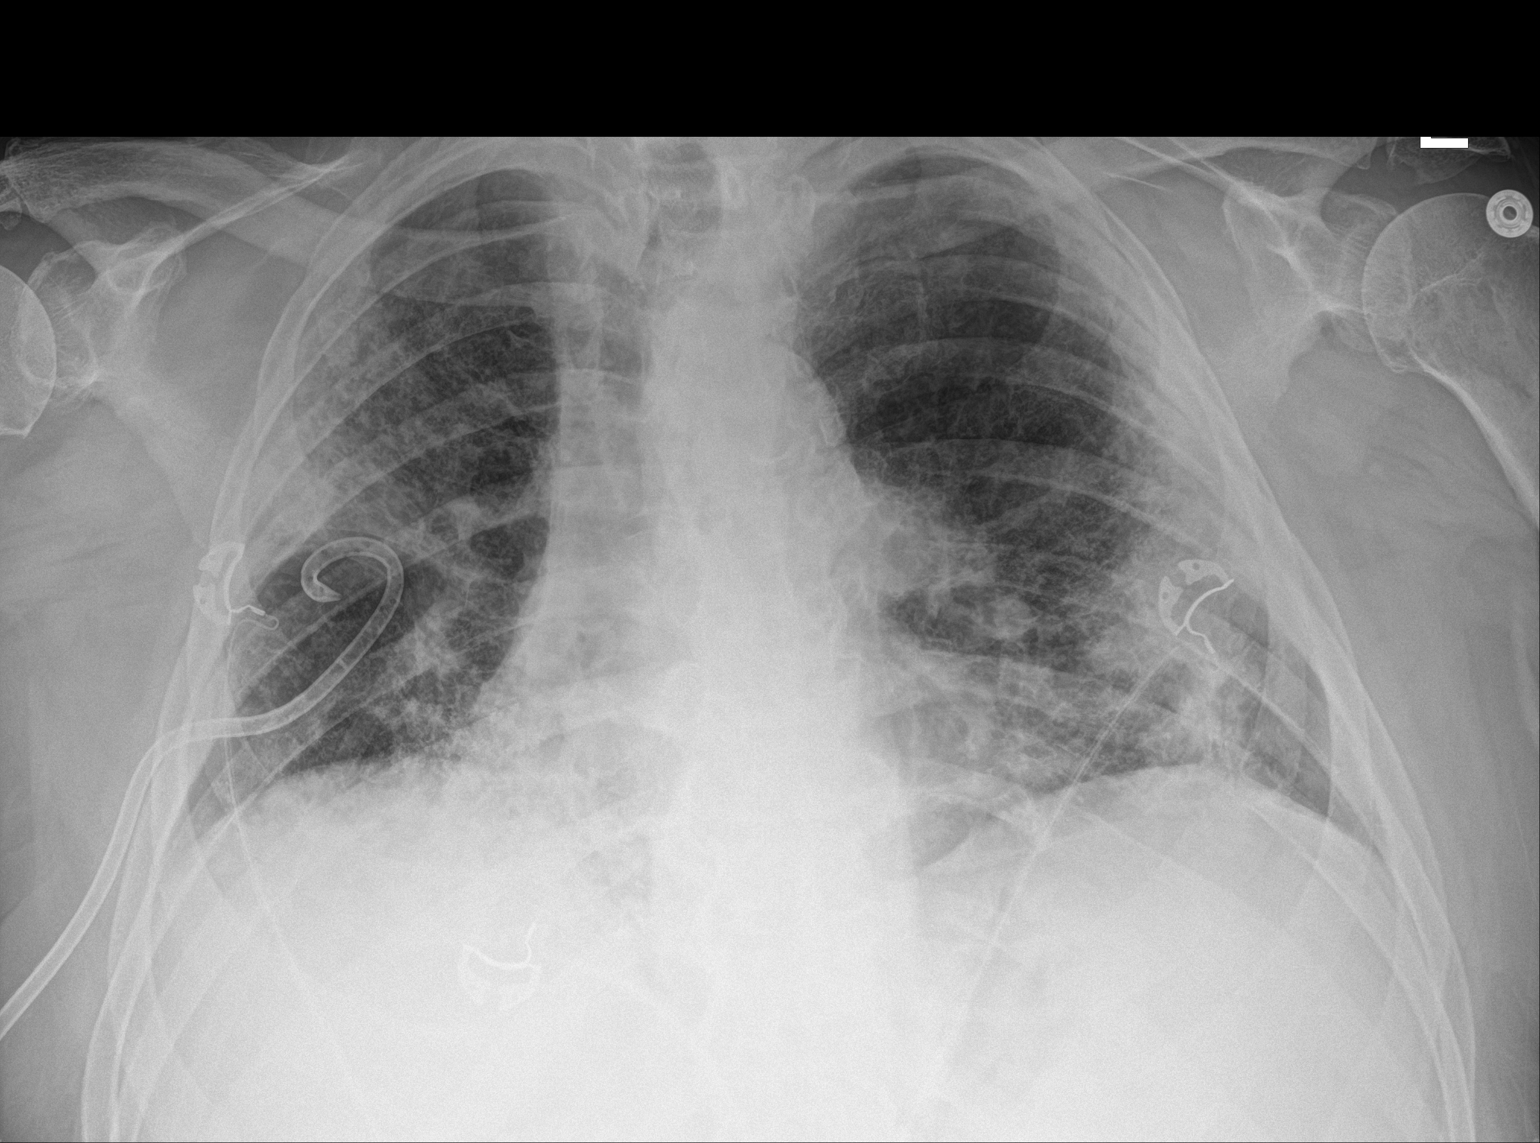

[1 of 1 positions shown; findings below may reference images not displayed]

FINDINGS: Pigtail RIGHT thoracostomy tube again seen.

Normal heart size, mediastinal contours, and pulmonary vascularity.

Atherosclerotic calcification aorta.

Patchy infiltrates in both lungs persist.

Previously identified RIGHT pneumothorax no longer seen.

No pleural effusion or acute osseous findings.
IMPRESSION: RIGHT thoracostomy tube with resolution of pneumothorax since prior
study.

BILATERAL pulmonary infiltrates, unchanged.

## 2020-08-01 IMAGING — DX DG CHEST 1V PORT
1 series · 1 of 1 positions shown · non-contrast
Comparison: Chest x-ray from same day.

CLINICAL DATA: Chest tube placement.

EXAM:
PORTABLE CHEST 1 VIEW

[chest ap]
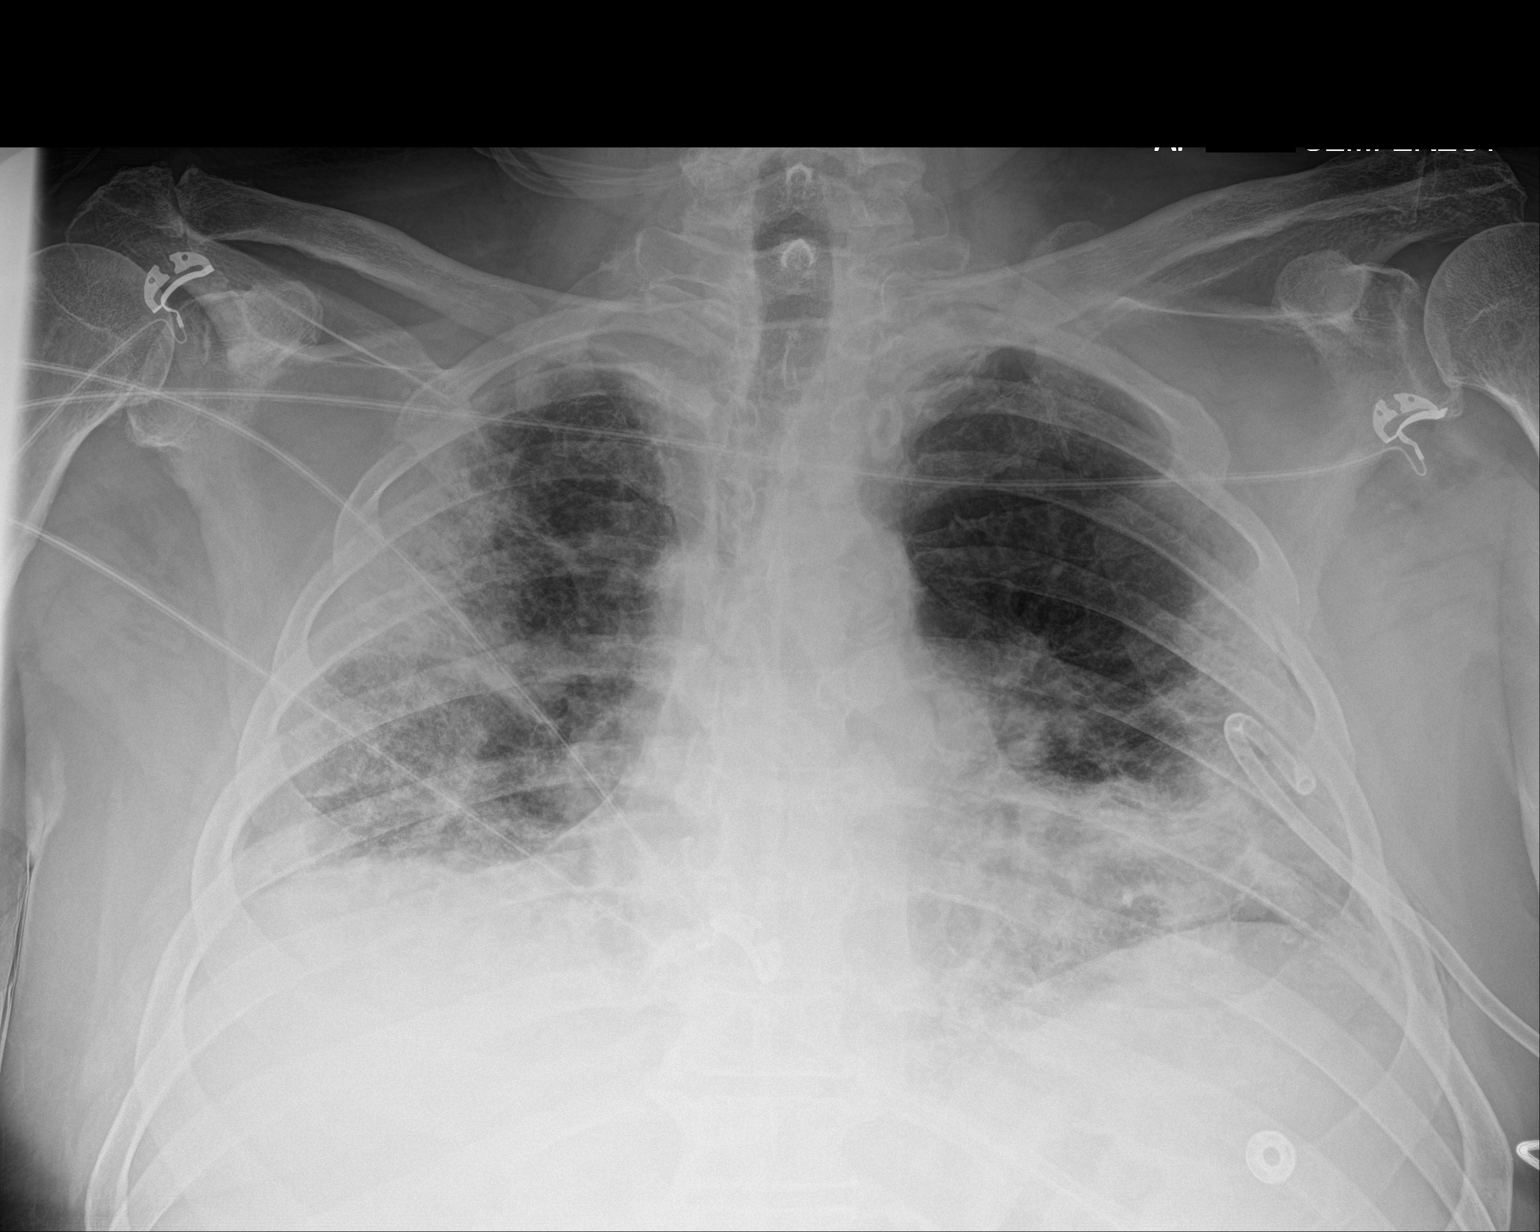

[1 of 1 positions shown; findings below may reference images not displayed]

FINDINGS: The right costophrenic angle is excluded from the field of view.
Interval placement of a left-sided pigtail chest tube without
significant residual pneumothorax. Low lung volumes. Prominent
patchy asymmetric peripheral and basilar predominant opacities are
unchanged. No pleural effusion. Stable cardiomediastinal silhouette.
No acute osseous abnormality.
IMPRESSION: 1. New left-sided pigtail chest tube without significant residual
pneumothorax.
2. Stable bilateral airspace disease consistent with [id number]
pneumonia.

## 2020-08-03 IMAGING — DX DG CHEST 1V PORT
1 series · 1 of 1 positions shown · non-contrast
Comparison: 06/19/2019

CLINICAL DATA: [id number] pneumonia and status post left chest tube
placement for spontaneous pneumothorax.

EXAM:
PORTABLE CHEST 1 VIEW

[chest ap]
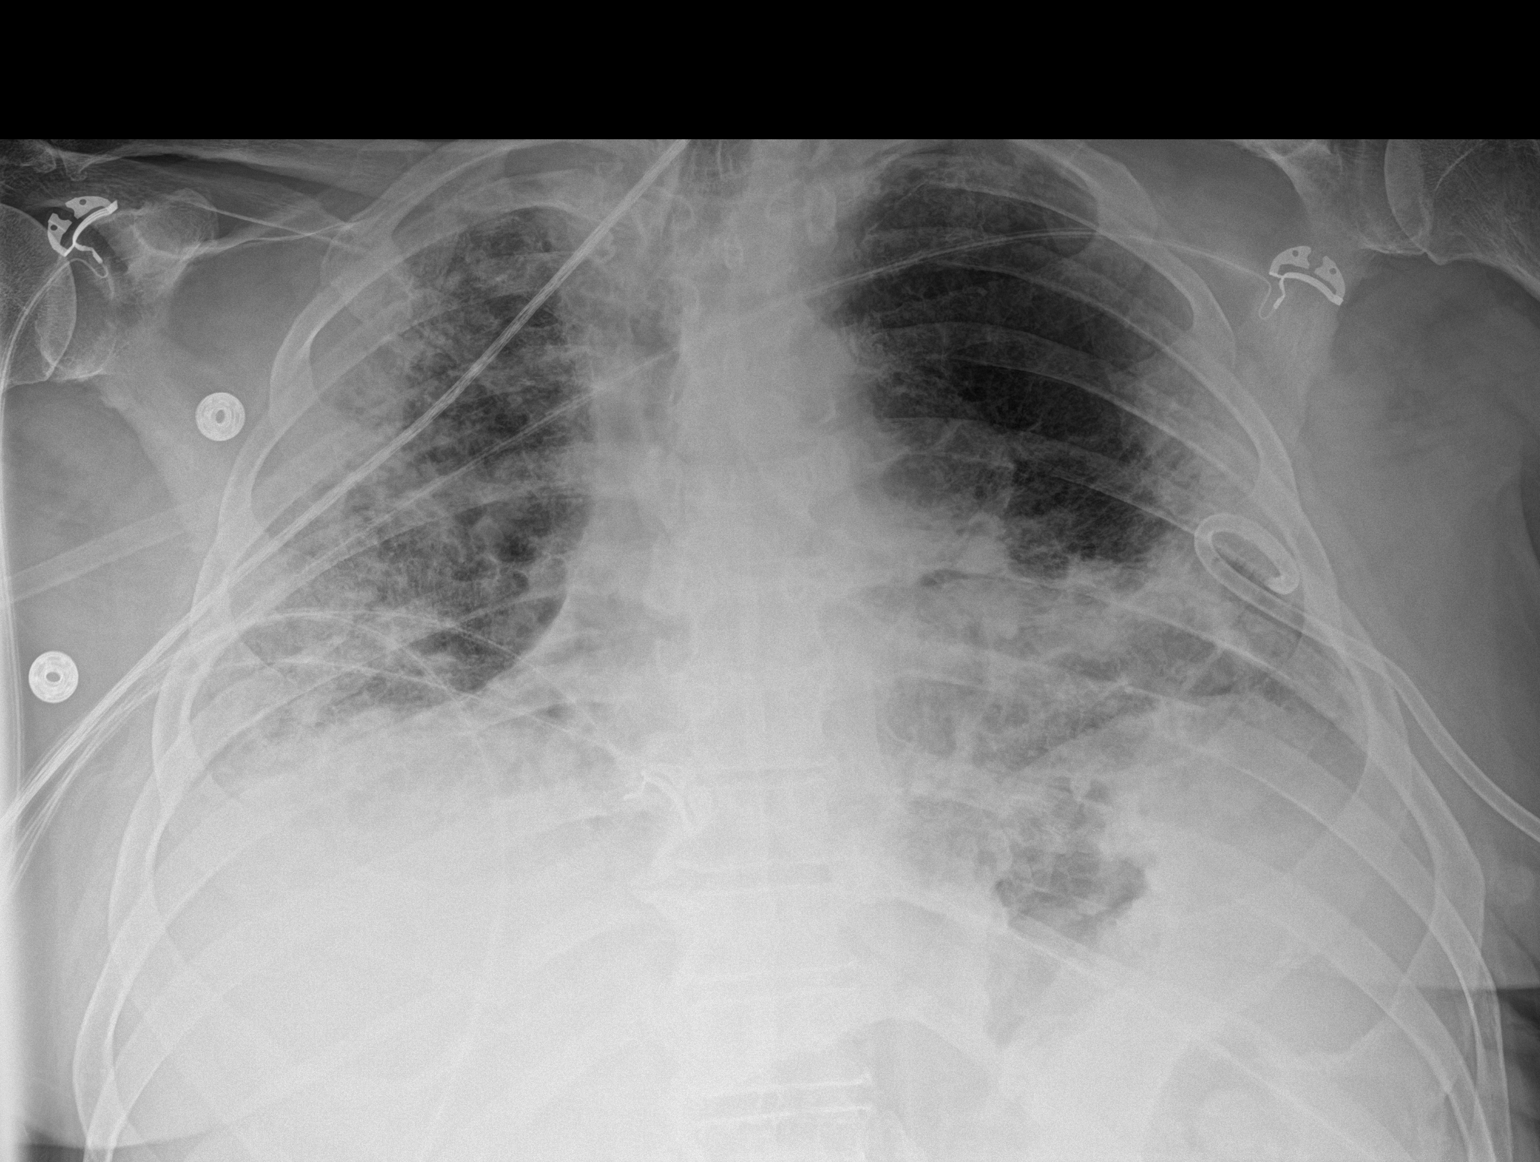

[1 of 1 positions shown; findings below may reference images not displayed]

FINDINGS: The heart size and mediastinal contours are within normal limits.
Left lateral pigtail chest tube remains in place with no visible
pneumothorax. Bilateral peripheral pulmonary infiltrates appears
stable. Lung volumes are low bilaterally. No significant pleural
effusions. The visualized skeletal structures are unremarkable.
IMPRESSION: No pneumothorax with stable positioning of left-sided chest tube.
Stable bilateral peripheral pulmonary infiltrates.
